# Patient Record
Sex: Female | Born: 1940 | Race: White | Hispanic: No | Marital: Married | Smoking: Former smoker
Health system: Southern US, Community
[De-identification: ages and names within clinical notes are randomized; demographics above are authoritative.]

## PROBLEM LIST (undated history)

## (undated) DIAGNOSIS — I82409 Acute embolism and thrombosis of unspecified deep veins of unspecified lower extremity: Secondary | ICD-10-CM

## (undated) DIAGNOSIS — Z7901 Long term (current) use of anticoagulants: Secondary | ICD-10-CM

## (undated) DIAGNOSIS — K219 Gastro-esophageal reflux disease without esophagitis: Secondary | ICD-10-CM

## (undated) DIAGNOSIS — E039 Hypothyroidism, unspecified: Secondary | ICD-10-CM

## (undated) DIAGNOSIS — I10 Essential (primary) hypertension: Secondary | ICD-10-CM

## (undated) DIAGNOSIS — K5792 Diverticulitis of intestine, part unspecified, without perforation or abscess without bleeding: Secondary | ICD-10-CM

## (undated) DIAGNOSIS — I2699 Other pulmonary embolism without acute cor pulmonale: Secondary | ICD-10-CM

## (undated) HISTORY — PX: APPENDECTOMY: SHX54

## (undated) HISTORY — DX: Acute embolism and thrombosis of unspecified deep veins of unspecified lower extremity: I82.409

## (undated) HISTORY — DX: Long term (current) use of anticoagulants: Z79.01

## (undated) HISTORY — DX: Diverticulitis of intestine, part unspecified, without perforation or abscess without bleeding: K57.92

## (undated) HISTORY — PX: SECONDARY INTRAOCULAR LENSE IMPLANTATION: SHX2390

## (undated) HISTORY — PX: PARTIAL HYSTERECTOMY: SHX80

## (undated) HISTORY — PX: SHOULDER SURGERY: SHX246

## (undated) HISTORY — DX: Gastro-esophageal reflux disease without esophagitis: K21.9

## (undated) HISTORY — DX: Essential (primary) hypertension: I10

## (undated) HISTORY — DX: Hypothyroidism, unspecified: E03.9

## (undated) HISTORY — DX: Other pulmonary embolism without acute cor pulmonale: I26.99

---

## 1998-03-14 HISTORY — PX: BACK SURGERY: SHX140

## 2000-05-02 ENCOUNTER — Ambulatory Visit (HOSPITAL_COMMUNITY): Admission: RE | Admit: 2000-05-02 | Discharge: 2000-05-02 | Payer: Self-pay | Admitting: Gastroenterology

## 2006-05-10 ENCOUNTER — Inpatient Hospital Stay (HOSPITAL_BASED_OUTPATIENT_CLINIC_OR_DEPARTMENT_OTHER): Admission: RE | Admit: 2006-05-10 | Discharge: 2006-05-10 | Payer: Self-pay | Admitting: Cardiovascular Disease

## 2007-11-01 ENCOUNTER — Inpatient Hospital Stay (HOSPITAL_COMMUNITY): Admission: EM | Admit: 2007-11-01 | Discharge: 2007-11-04 | Payer: Self-pay | Admitting: Emergency Medicine

## 2010-02-24 ENCOUNTER — Ambulatory Visit: Payer: Self-pay | Admitting: Cardiovascular Disease

## 2010-05-26 ENCOUNTER — Other Ambulatory Visit: Payer: Self-pay | Admitting: Cardiovascular Disease

## 2010-05-26 ENCOUNTER — Ambulatory Visit
Admission: RE | Admit: 2010-05-26 | Discharge: 2010-05-26 | Disposition: A | Discharge status: Home / Self Care | Payer: PRIVATE HEALTH INSURANCE | Source: Ambulatory Visit | Attending: Cardiovascular Disease | Admitting: Cardiovascular Disease

## 2010-05-26 ENCOUNTER — Ambulatory Visit (INDEPENDENT_AMBULATORY_CARE_PROVIDER_SITE_OTHER): Payer: PRIVATE HEALTH INSURANCE | Admitting: Cardiovascular Disease

## 2010-05-26 ENCOUNTER — Inpatient Hospital Stay (HOSPITAL_COMMUNITY)
Admission: AD | Admit: 2010-05-26 | Discharge: 2010-05-31 | DRG: 176 | Disposition: A | Discharge status: Home / Self Care | Payer: Medicare Other | Source: Ambulatory Visit | Attending: Cardiovascular Disease | Admitting: Cardiovascular Disease

## 2010-05-26 DIAGNOSIS — Z7901 Long term (current) use of anticoagulants: Secondary | ICD-10-CM

## 2010-05-26 DIAGNOSIS — I10 Essential (primary) hypertension: Secondary | ICD-10-CM

## 2010-05-26 DIAGNOSIS — I2699 Other pulmonary embolism without acute cor pulmonale: Principal | ICD-10-CM | POA: Diagnosis present

## 2010-05-26 DIAGNOSIS — E039 Hypothyroidism, unspecified: Secondary | ICD-10-CM | POA: Diagnosis present

## 2010-05-26 DIAGNOSIS — I82409 Acute embolism and thrombosis of unspecified deep veins of unspecified lower extremity: Secondary | ICD-10-CM | POA: Diagnosis present

## 2010-05-26 DIAGNOSIS — R0602 Shortness of breath: Secondary | ICD-10-CM

## 2010-05-26 DIAGNOSIS — E119 Type 2 diabetes mellitus without complications: Secondary | ICD-10-CM

## 2010-05-26 DIAGNOSIS — Z880 Allergy status to penicillin: Secondary | ICD-10-CM

## 2010-05-26 DIAGNOSIS — Z882 Allergy status to sulfonamides status: Secondary | ICD-10-CM

## 2010-05-26 DIAGNOSIS — I517 Cardiomegaly: Secondary | ICD-10-CM

## 2010-05-26 LAB — PROTIME-INR
INR: 1.02 (ref 0.00–1.49)
Prothrombin Time: 13.6 seconds (ref 11.6–15.2)

## 2010-05-26 LAB — CBC
MCV: 86.6 fL (ref 78.0–100.0)
Platelets: 272 10*3/uL (ref 150–400)
RBC: 4.25 MIL/uL (ref 3.87–5.11)
RDW: 12.8 % (ref 11.5–15.5)
WBC: 10.3 10*3/uL (ref 4.0–10.5)

## 2010-05-26 LAB — COMPREHENSIVE METABOLIC PANEL
Albumin: 3.4 g/dL — ABNORMAL LOW (ref 3.5–5.2)
Alkaline Phosphatase: 61 U/L (ref 39–117)
BUN: 10 mg/dL (ref 6–23)
Calcium: 8.3 mg/dL — ABNORMAL LOW (ref 8.4–10.5)
Creatinine, Ser: 1 mg/dL (ref 0.4–1.2)
Glucose, Bld: 146 mg/dL — ABNORMAL HIGH (ref 70–99)
Total Protein: 7.1 g/dL (ref 6.0–8.3)

## 2010-05-26 LAB — DIFFERENTIAL
Basophils Absolute: 0 10*3/uL (ref 0.0–0.1)
Basophils Relative: 0 % (ref 0–1)
Eosinophils Absolute: 0.2 10*3/uL (ref 0.0–0.7)
Eosinophils Relative: 2 % (ref 0–5)
Lymphs Abs: 3.8 10*3/uL (ref 0.7–4.0)
Neutrophils Relative %: 56 % (ref 43–77)

## 2010-05-26 LAB — GLUCOSE, CAPILLARY: Glucose-Capillary: 119 mg/dL — ABNORMAL HIGH (ref 70–99)

## 2010-05-26 LAB — APTT: aPTT: 92 seconds — ABNORMAL HIGH (ref 24–37)

## 2010-05-26 MED ORDER — IOHEXOL 300 MG/ML  SOLN
125.0000 mL | Freq: Once | INTRAMUSCULAR | Status: AC | PRN
  Administered 2010-05-26: 125 mL via INTRAVENOUS

## 2010-05-27 DIAGNOSIS — I2699 Other pulmonary embolism without acute cor pulmonale: Secondary | ICD-10-CM

## 2010-05-27 LAB — CBC
Hemoglobin: 13.3 g/dL (ref 12.0–15.0)
MCH: 29.2 pg (ref 26.0–34.0)
MCV: 87 fL (ref 78.0–100.0)
Platelets: 361 10*3/uL (ref 150–400)
RBC: 4.55 MIL/uL (ref 3.87–5.11)
WBC: 11.1 10*3/uL — ABNORMAL HIGH (ref 4.0–10.5)

## 2010-05-27 LAB — BASIC METABOLIC PANEL
CO2: 26 mEq/L (ref 19–32)
Chloride: 105 mEq/L (ref 96–112)
Creatinine, Ser: 0.97 mg/dL (ref 0.4–1.2)
GFR calc Af Amer: >60 mL/min (ref >60)
Sodium: 137 mEq/L (ref 135–145)

## 2010-05-27 LAB — BETA-2-GLYCOPROTEIN I ABS, IGG/M/A
Beta-2-Glycoprotein I IgA: 12 A Units (ref <20)
Beta-2-Glycoprotein I IgM: 16 M Units (ref <20)

## 2010-05-27 LAB — LUPUS ANTICOAGULANT PANEL
PTT Lupus Anticoagulant: 108.7 secs — ABNORMAL HIGH (ref 30.0–45.6)
PTTLA 4:1 Mix: 88.5 secs — ABNORMAL HIGH (ref 30.0–45.6)
PTTLA Confirmation: 15.3 secs — ABNORMAL HIGH (ref <8.0)

## 2010-05-27 LAB — CARDIOLIPIN ANTIBODIES, IGG, IGM, IGA
Anticardiolipin IgG: 9 GPL U/mL — ABNORMAL LOW (ref <23)
Anticardiolipin IgM: 2 MPL U/mL — ABNORMAL LOW (ref <11)

## 2010-05-27 LAB — FACTOR 5 LEIDEN

## 2010-05-27 LAB — HOMOCYSTEINE: Homocysteine: 10.8 umol/L (ref 4.0–15.4)

## 2010-05-27 LAB — HEPARIN LEVEL (UNFRACTIONATED): Heparin Unfractionated: <0.1 IU/mL — ABNORMAL LOW (ref 0.30–0.70)

## 2010-05-27 LAB — ANTITHROMBIN III: AntiThromb III Func: 101 % (ref 76–126)

## 2010-05-28 LAB — PROTHROMBIN GENE MUTATION

## 2010-05-28 LAB — HEPARIN LEVEL (UNFRACTIONATED): Heparin Unfractionated: 0.71 IU/mL — ABNORMAL HIGH (ref 0.30–0.70)

## 2010-05-29 LAB — CBC
MCH: 29.6 pg (ref 26.0–34.0)
MCHC: 33.7 g/dL (ref 30.0–36.0)
Platelets: 315 10*3/uL (ref 150–400)

## 2010-05-29 LAB — HEPARIN LEVEL (UNFRACTIONATED): Heparin Unfractionated: 0.65 IU/mL (ref 0.30–0.70)

## 2010-05-30 ENCOUNTER — Inpatient Hospital Stay (HOSPITAL_COMMUNITY): Payer: Medicare Other

## 2010-05-30 LAB — PROTIME-INR: Prothrombin Time: 14.3 seconds (ref 11.6–15.2)

## 2010-05-31 LAB — PROTIME-INR
INR: 1.36 (ref 0.00–1.49)
Prothrombin Time: 17 seconds — ABNORMAL HIGH (ref 11.6–15.2)

## 2010-06-01 LAB — PROTEIN C, TOTAL: Protein C, Total: 142 % — ABNORMAL HIGH (ref 70–140)

## 2010-06-01 LAB — FACTOR 5 LEIDEN

## 2010-06-02 ENCOUNTER — Ambulatory Visit (INDEPENDENT_AMBULATORY_CARE_PROVIDER_SITE_OTHER): Payer: Medicare Other | Admitting: *Deleted

## 2010-06-02 DIAGNOSIS — I2699 Other pulmonary embolism without acute cor pulmonale: Secondary | ICD-10-CM

## 2010-06-02 DIAGNOSIS — Z7901 Long term (current) use of anticoagulants: Secondary | ICD-10-CM

## 2010-06-02 DIAGNOSIS — I82409 Acute embolism and thrombosis of unspecified deep veins of unspecified lower extremity: Secondary | ICD-10-CM

## 2010-06-03 ENCOUNTER — Encounter: Payer: Self-pay | Admitting: Cardiovascular Disease

## 2010-06-08 ENCOUNTER — Encounter: Payer: Self-pay | Admitting: Nurse Practitioner

## 2010-06-08 DIAGNOSIS — I10 Essential (primary) hypertension: Secondary | ICD-10-CM

## 2010-06-08 DIAGNOSIS — Z7901 Long term (current) use of anticoagulants: Secondary | ICD-10-CM

## 2010-06-08 DIAGNOSIS — I2699 Other pulmonary embolism without acute cor pulmonale: Secondary | ICD-10-CM | POA: Insufficient documentation

## 2010-06-09 ENCOUNTER — Ambulatory Visit (INDEPENDENT_AMBULATORY_CARE_PROVIDER_SITE_OTHER): Payer: Medicare Other | Admitting: Nurse Practitioner

## 2010-06-09 ENCOUNTER — Encounter: Payer: Self-pay | Admitting: Nurse Practitioner

## 2010-06-09 VITALS — BP 140/84 | HR 78 | Wt 185.0 lb

## 2010-06-09 DIAGNOSIS — I272 Pulmonary hypertension, unspecified: Secondary | ICD-10-CM | POA: Insufficient documentation

## 2010-06-09 DIAGNOSIS — R252 Cramp and spasm: Secondary | ICD-10-CM

## 2010-06-09 DIAGNOSIS — Z7901 Long term (current) use of anticoagulants: Secondary | ICD-10-CM

## 2010-06-09 DIAGNOSIS — R0989 Other specified symptoms and signs involving the circulatory and respiratory systems: Secondary | ICD-10-CM

## 2010-06-09 DIAGNOSIS — IMO0001 Reserved for inherently not codable concepts without codable children: Secondary | ICD-10-CM

## 2010-06-09 DIAGNOSIS — R0609 Other forms of dyspnea: Secondary | ICD-10-CM

## 2010-06-09 DIAGNOSIS — R0600 Dyspnea, unspecified: Secondary | ICD-10-CM

## 2010-06-09 DIAGNOSIS — I2699 Other pulmonary embolism without acute cor pulmonale: Secondary | ICD-10-CM

## 2010-06-09 DIAGNOSIS — I10 Essential (primary) hypertension: Secondary | ICD-10-CM

## 2010-06-09 LAB — BASIC METABOLIC PANEL
BUN: 19 mg/dL (ref 6–23)
CO2: 26 mEq/L (ref 19–32)
Calcium: 8.7 mg/dL (ref 8.4–10.5)
Chloride: 103 mEq/L (ref 96–112)
Creat: 1.09 mg/dL (ref 0.40–1.20)
Glucose, Bld: 83 mg/dL (ref 70–99)
Potassium: 4.3 mEq/L (ref 3.5–5.3)
Sodium: 139 mEq/L (ref 135–145)

## 2010-06-09 LAB — MAGNESIUM: Magnesium: 2 mg/dL (ref 1.5–2.5)

## 2010-06-09 NOTE — Patient Instructions (Signed)
We are going to check your coumadin level today.  We are going to check some other labs today to include magnesium and potassium See Dr. Elease Hashimoto in about 2 months.

## 2010-06-09 NOTE — Assessment & Plan Note (Signed)
INR is 5.5 today. We will hold coumadin today and tomorrow and recheck on Friday. She is off her Lovenox and aspirin.

## 2010-06-09 NOTE — Progress Notes (Signed)
History of Present Illness: Virginia Summers is seen back today for a post hospital visit. She is seen for Dr. Elease Hashimoto. She had bilateral PE and DVT. She is now on coumadin. Her workup for the etiology was negative. Hospital records are reviewed. She is now off of her hormones. She is feeling better. She is very anxious about her condition. She has had no further chest pain. She has just a few twinges of discomfort in her legs. She has had no swelling. She is tolerating her medicines. She does have some bruising. Her echo did show grade 1 diastolic dysfunction with mild to moderate pulmonary HTN. She is trying to lose weight.   Current Outpatient Prescriptions on File Prior to Visit  Medication Sig Dispense Refill  . albuterol (PROVENTIL HFA) 108 (90 BASE) MCG/ACT inhaler Inhale 2 puffs into the lungs every 4 (four) hours as needed.        Marland Kitchen amLODipine (NORVASC) 5 MG tablet Take 5 mg by mouth daily.        . furosemide (LASIX) 40 MG tablet Take 40 mg by mouth daily.        Marland Kitchen levothyroxine (SYNTHROID, LEVOTHROID) 25 MCG tablet Take 25 mcg by mouth daily.        Marland Kitchen lisinopril (PRINIVIL,ZESTRIL) 40 MG tablet Take 40 mg by mouth daily.        Marland Kitchen loratadine (CLARITIN) 10 MG tablet Take 10 mg by mouth daily.        . Multiple Vitamin (MULTIVITAMIN) tablet Take 1 tablet by mouth daily.        . nebivolol (BYSTOLIC) 10 MG tablet Take 10 mg by mouth daily.        Marland Kitchen omeprazole (PRILOSEC) 20 MG capsule Take 20 mg by mouth daily.        . potassium chloride (KLOR-CON) 10 MEQ CR tablet Take 10 mEq by mouth daily.        . traMADol (ULTRAM) 50 MG tablet Take 50 mg by mouth every 4 (four) hours as needed. May take one to two tablets, up to 8/day       . warfarin (COUMADIN) 5 MG tablet Take 7.5 mg by mouth daily.        . fish oil-omega-3 fatty acids 1000 MG capsule Take 1 g by mouth daily.        Marland Kitchen DISCONTD: aspirin 81 MG tablet Take 81 mg by mouth daily.        Marland Kitchen DISCONTD: enoxaparin (LOVENOX) 150 MG/ML injection Inject into  the skin every 12 (twelve) hours.        Marland Kitchen DISCONTD: estradiol (ESTRACE) 2 MG tablet Take 2 mg by mouth daily. 1/2 TO 1 TABLET DAILY         Allergies  Allergen Reactions  . Penicillins Swelling and Rash  . Sulfa Drugs Cross Reactors Swelling and Rash    Past Medical History  Diagnosis Date  . DVT (deep venous thrombosis)     BILATERAL  . Pulmonary embolism   . HTN (hypertension)   . Diabetes mellitus   . Chest pain   . Hypothyroidism   . GERD (gastroesophageal reflux disease)   . Diverticulitis   . Anticoagulant long-term use     Past Surgical History  Procedure Date  . Back surgery 2000    DISC  . Partial hysterectomy AGE 88's APPROX.  Marland Kitchen Appendectomy AGE 24  . Shoulder surgery 5-6 YRS OLD    PINS  . Secondary intraocular lense implantation 5621,3086  OU    History  Smoking status  . Former Smoker -- 0.5 packs/day for 30 years  . Types: Cigarettes  . Quit date: 03/15/2003  Smokeless tobacco  . Never Used    History  Alcohol Use No    Family History  Problem Relation Age of Onset  . Transient ischemic attack Father   . Coronary artery disease Father   . Stroke Mother   . Diabetes Mother   . Diabetes Sister   . Hypertension Daughter     Review of Systems: The review of systems is as above.  All other systems were reviewed and are negative.  Physical Exam: BP 140/84  Pulse 78  Wt 185 lb (83.915 kg) She is a pleasant obese female, in no acute distress. Skin is warm and dry. Color is normal. She has some bruising on her arms. HEENT is negative. Lungs are clear. Cardiac exam shows a regular rate and rhythm. Abdomen is obese. Extremities are without edema. Gait and ROM are intact. She has no gross neurologic deficits.  ECG: N/A  Assessment / Plan:

## 2010-06-09 NOTE — Assessment & Plan Note (Signed)
BMET and Mg are checked today.

## 2010-06-09 NOTE — Assessment & Plan Note (Signed)
Mild LVH per echo. We will keep her on her current meds. I encouraged weight loss.

## 2010-06-09 NOTE — Assessment & Plan Note (Addendum)
She remains on coumadin anticoagulation. Her Factor V was negative. Her hypercoagulable workup was notable for presence of lupus anticoagulant. Workup was negative for prothrombin gene mutation, presence of cariolipin antibodies, beta 2 glycoprotein antibodies. Total protein was WNL. Follow up labs are checked today.   Will discuss with Dr. Elease Hashimoto regarding further workup. I think she is committed to long term coumadin.

## 2010-06-09 NOTE — Assessment & Plan Note (Signed)
Mild to moderate per echo. Encouraged weight loss. Will need follow up echo in the future.

## 2010-06-10 ENCOUNTER — Telehealth: Payer: Self-pay | Admitting: Nurse Practitioner

## 2010-06-10 NOTE — Telephone Encounter (Signed)
Spoke with Virginia Summers. Her labs are reviewed with her. I relayed to her that I had spoken with Dr. Elease Hashimoto. She had a positive lupus anticoagulant which is the reason for her DVT/PE. She is committed to life time anticoagulation. He has spoken with Dr. Cyndie Chime regarding her condition and we may send her to see him in the future. For now, she is to stay with her current regimen that includes coumadin.

## 2010-06-11 ENCOUNTER — Encounter (INDEPENDENT_AMBULATORY_CARE_PROVIDER_SITE_OTHER): Payer: Medicare Other | Admitting: *Deleted

## 2010-06-11 ENCOUNTER — Ambulatory Visit (INDEPENDENT_AMBULATORY_CARE_PROVIDER_SITE_OTHER): Payer: Self-pay | Admitting: *Deleted

## 2010-06-11 DIAGNOSIS — R0989 Other specified symptoms and signs involving the circulatory and respiratory systems: Secondary | ICD-10-CM

## 2010-06-11 LAB — POCT INR: INR: 3.3

## 2010-06-15 ENCOUNTER — Ambulatory Visit (INDEPENDENT_AMBULATORY_CARE_PROVIDER_SITE_OTHER): Payer: Medicare Other | Admitting: *Deleted

## 2010-06-15 DIAGNOSIS — Z7901 Long term (current) use of anticoagulants: Secondary | ICD-10-CM

## 2010-06-15 DIAGNOSIS — I82409 Acute embolism and thrombosis of unspecified deep veins of unspecified lower extremity: Secondary | ICD-10-CM

## 2010-06-25 ENCOUNTER — Ambulatory Visit (INDEPENDENT_AMBULATORY_CARE_PROVIDER_SITE_OTHER): Payer: Medicare Other | Admitting: *Deleted

## 2010-06-25 DIAGNOSIS — I82409 Acute embolism and thrombosis of unspecified deep veins of unspecified lower extremity: Secondary | ICD-10-CM

## 2010-06-25 LAB — POCT INR: INR: 3.6

## 2010-06-28 NOTE — Discharge Summary (Signed)
NAMEMELL, MELLOTT NO.:  [id number]  MEDICAL RECORD NO.:  [id number]           PATIENT TYPE:  I  LOCATION:  2025                         FACILITY:  [facility]  PHYSICIAN:  Vesta Mixer, M.D. DATE OF BIRTH:  1940/03/17  DATE OF ADMISSION:  05/26/2010 DATE OF DISCHARGE:  05/31/2010                              DISCHARGE SUMMARY   PRIMARY CARDIOLOGIST:  Vesta Mixer, MD  PRIMARY CARE PROVIDER:  Windle Guard, MD  DISCHARGE DIAGNOSIS:  Bilateral acute pulmonary thromboembolism.  SECONDARY DIAGNOSES: 1. Bilateral deep vein thromboses. 2. Hypertension. 3. Diabetes mellitus. 4. History of chest pain with normal catheterization in 2008. 5. Hypothyroidism.  ALLERGIES:  SULFA and PENICILLIN.  PROCEDURES:  A 2-D echocardiogram on May 26, 2010 showing an EF of 60- 65% with normal wall motion.  Grade 1 diastolic dysfunction.  Mild-to- moderate pulmonary hypertension with a PASP of 40 mmHg.  HISTORY OF PRESENT ILLNESS:  A 70 year old female with the above problem list.  Approximately 8 days prior to admission, the patient began to experience pleuritic chest pain and exertional dyspnea.  She was seen in the office on May 26, 2010 and referred to [facility] for lower extremity ultrasound and CT angio of the chest.  Lower extremity ultrasound did show bilateral deep venous thromboses while CT angio of the chest showed bilateral pulmonary emboli.  She was admitted to [facility] for further evaluation and anticoagulation.  HOSPITAL COURSE:  Following admission, the patient was placed on heparin and Coumadin therapy.  A 2-D echocardiogram was undertaken on May 26, 2010, showing normal LV function and mildly-to-moderately elevated pulmonary pressures.  There was no evidence of right ventricular failure.  The patient's heparin was subsequently switched to Lovenox therapy at 80 mg b.i.d.  Her INR has been slow to rise and today is only 1.36.   Hypercoagulable workup was notable for presence of lupus anticoagulant.  Factor V Leiden evaluation is still pending.  Workup was negative for prothrombin gene mutation, presence of cardiolipin antibodies, presents of beta 2-glycoprotein antibodies.  Total protein CNS were within normal limits with slight elevation of her functional protein C.  During this admission, the patient had a mild productive cough.  She remained afebrile with normal white count.  She has shown improvement with albuterol inhaler.  She is felt satisfactory for discharge today.  DISCHARGE LABS:  Hemoglobin 12.8, hematocrit 38.0, WBC 9.1, platelets 316.  INR 1.36.  PT 17, total protein C 129, functional protein C 169, total protein S 125, functional protein S 124.  Lupus anticoagulant was detected.  PTT-LA 108.7, PTT-LA 4:1 mix 88.5, DRVVT 51.4, DRVVT 1:1 mix 43.1.  Sodium 137, potassium 3.5, chloride 105, CO2 26, BUN 11, creatinine 0.97, glucose 113, total bilirubin 0.5, alkaline phosphatase 61, AST 19, ALT 11.  Total protein 7.1, albumin 3.4, calcium 8.2.  TSH 3.206.  Homocysteine 10.8.  Beta 2-glycoprotein antibodies were within normal limits.  Anticardiolipin antibodies were within normal limits. Prothrombin gene mutation was negative.  DISPOSITION:  The patient will be discharged home today in good condition.  FOLLOWUP PLANS AND APPOINTMENTS:  The  patient will follow up with [facility] on June 02, 2010 at 1 p.m.  She will follow up with Norma Fredrickson, nurse practitioner on June 09, 2010 at 1:30 p.m.  DISCHARGE MEDICATIONS: 1. Albuterol 90 mcg inhaler two puffs q. 4 h. p.r.n. 2. Coumadin 5 mg one and a half tablets nightly until directed     otherwise by Coumadin Clinic. 3. Enoxaparin 80 mg subcu b.i.d. until directed otherwise by Coumadin     Clinic. 4. Potassium chloride 10 mEq daily,. 5. Amlodipine 5 mg daily. 6. Bystolic 5 mg daily. 7. Furosemide 40 mg daily. 8.  Levothyroxine 25 mcg daily. 9. Lisinopril 40 mg daily. 10.Loratadine 10 mg daily. 11.Omeprazole 20 mg daily. 12.Tramadol 50 mg one to two tablets q.4 h. p.r.n.  OUTSTANDING LABORATORY STUDIES:  Follow up INR on Wednesday, June 02, 2010.  Factor V Leiden results are still pending.  DURATION DISCHARGE ENCOUNTER:  Sixty minutes including physician time.     Nicolasa Ducking, ANP   ______________________________ Vesta Mixer, M.D.    CB/MEDQ  D:  05/31/2010  T:  06/01/2010  Job:  [id number]  cc:   Windle Guard, M.D.  Electronically Signed by Nicolasa Ducking ANP on 06/22/2010 04:03:24 PM Electronically Signed by Kristeen Miss M.D. on 06/28/2010 03:11:13 PM

## 2010-07-02 ENCOUNTER — Ambulatory Visit (INDEPENDENT_AMBULATORY_CARE_PROVIDER_SITE_OTHER): Payer: Medicare Other | Admitting: *Deleted

## 2010-07-02 DIAGNOSIS — I82409 Acute embolism and thrombosis of unspecified deep veins of unspecified lower extremity: Secondary | ICD-10-CM

## 2010-07-02 LAB — POCT INR: INR: 2.5

## 2010-07-06 ENCOUNTER — Encounter: Payer: Medicare Other | Admitting: *Deleted

## 2010-07-16 ENCOUNTER — Ambulatory Visit (INDEPENDENT_AMBULATORY_CARE_PROVIDER_SITE_OTHER): Payer: Medicare Other | Admitting: *Deleted

## 2010-07-16 DIAGNOSIS — I82409 Acute embolism and thrombosis of unspecified deep veins of unspecified lower extremity: Secondary | ICD-10-CM

## 2010-07-27 NOTE — Discharge Summary (Signed)
Virginia Summers, Virginia Summers NO.:  [id number]   MEDICAL RECORD NO.:  [id number]          PATIENT TYPE:  INP   LOCATION:  6733                         FACILITY:  [facility]   PHYSICIAN:  Beckey Rutter, MD  DATE OF BIRTH:  [birth year]   DATE OF ADMISSION:  11/01/2007  DATE OF DISCHARGE:  11/04/2007                               DISCHARGE SUMMARY   PRIMARY CARE PHYSICIAN:  Windle Guard, MD   HISTORY OF PRESENT ILLNESS AND PRESENTING COMPLAINT:  A 70 year old  presented with complaint of nausea and vomiting and lower back pain.  The patient was found to have evidence of urinary tract infection and  pyelonephritis.   HOSPITAL COURSE:  1. The patient was started on Levaquin for pyelonephritis.  The      patient has very good response with improvement of the pains,      improvement of the fever, and improvement of the white count.  The      white count went from 15.6 on presentation to 6.7.  The blood      culture grew Escherichia coli which is sensitive to most      antibiotic.  Plan now is to continue the patient on Levaquin orally      to complete 14 days for complicated urinary tract infection since      this UTI is recurrent and now is complicated with pyelonephritis      picture.  I also gave the patient referral to [facility]      because of the same reason, complicated urinary tract infection and      frequent recurrence.  2. Hypothyroidism.  TSH was checked.  The patient remained stable      during hospital stay.  3. Diabetes.  A1c was checked as below.  The patient remained on      sliding scale and remained stable during hospital stay.  4. Obesity.  The patient counseled to modified diet.  5. Hypertension.  The patient is on Norvasc and she will be discharged      on Norvasc and Lasix as well as lisinopril.   DISCHARGE MEDICATIONS:  1. Levaquin 500 mg p.o. once a day for 10 more days.  2. Norvasc 5 mg p.o. daily.  3. Lasix 40 mg daily.  4. Lisinopril 40  mg daily.  5. Synthroid 50 mcg daily.   DISCHARGE DIAGNOSES:  1. Pyelonephritis.  2. Diabetes.  3. Obesity.  4. Hypothyroidism.  5. Hypertension.   HOSPITAL PROCEDURES:  1. Abdominal x-ray on November 01, 2007 showing no evidence of bowel      obstruction or free intraperitoneal air.  2. Urine culture collected on November 01, 2007, the growth was showing      Escherichia coli more than 100,000 colonies.  The the E. coli is      sensitive to cefazolin, ceftriaxone, ciprofloxacin, gentamicin,      levofloxacin, nitrofurantoin, Tobramycin, and Bactrim.  The E. coli      is resistant to ampicillin.  The A1c on November 02, 2007 was showing      6.4.  Free T4 is 1.14.  TSH is 0.74.   DISCHARGE PLAN:  The patient is stable for discharge today to follow up  with Dr. Jeannetta Nap as discussed with her.  I gave the patient referral to  [facility] for recurrent and complicated urinary tract infection  and prescription was given for Levaquin to finish a course of 14 days.      Beckey Rutter, MD  Electronically Signed     EME/MEDQ  D:  11/04/2007  T:  11/05/2007  Job:  [id number]   cc:   Windle Guard, M.D.

## 2010-07-27 NOTE — H&P (Signed)
NAMESOLEDAD, BUDREAU NO.:  [id number]   MEDICAL RECORD NO.:  [id number]          PATIENT TYPE:  INP   LOCATION:  6733                         FACILITY:  [facility]   PHYSICIAN:  Renee Ramus, MD       DATE OF BIRTH:  07-17-1940   DATE OF ADMISSION:  11/01/2007  DATE OF DISCHARGE:                              HISTORY & PHYSICAL   HISTORY:  The patient is a 70 year old female with complaints of nausea,  vomiting, and pain in the lower back x1 day prior to admission.  The  patient has a history of recurrent pyelonephritis, but currently denies  frequency or urgency.  The patient also denies fevers, chills, night  sweats, chest pain, shortness of breath, PND, or orthopnea.  The patient  also denies dysuria.  The patient is seen in the Emergency Department  diagnosed with pyelonephritis and has been admitted for further  evaluation and treatment.   PAST MEDICAL HISTORY:  1. Hypertension.  2. Recurrent pyelonephritis.  3. Diverticulitis.  4. Hypothyroid.  5. Obesity.  6. Type 2 diabetes mellitus, diet controlled.   MEDICATIONS:  1. Norvasc 5 mg p.o. daily.  2. Lasix 20 mg p.o. q.12 h. p.r.n. fluid.  3. Lisinopril 40 mg p.o. daily.  4. Tramadol 50 mg 1-2 p.o. q.4. h. p.r.n. anxiety.  5. Synthroid 50 mcg p.o. daily.   ALLERGIES:  SULFA and PENICILLIN.   SOCIAL HISTORY:  The patient use no alcohol or tobacco use.  She lives  at home with her husband.   FAMILY HISTORY:  Not available.   REVIEW OF SYSTEMS:  All other comprehensive review of systems are  negative.   PHYSICAL EXAMINATION:  GENERAL:  This is a well developed, well  nourished, somewhat obese white female, currently in no apparent  distress.  VITAL SIGNS:  Blood pressure 133/67, pulse of 90, respiratory rate 22,  temperature 102.1.  HEENT:  Oropharynx is clear.  Mucous membranes pink and moist.  TMs  clear bilaterally.  Pupils equal, round, reactive to light and  accommodation.  Extraocular muscles  are intact.  NECK:  No jugular venous distention, lymphadenopathy.  CARDIOVASCULAR:  Regular rate and rhythm without murmurs, rubs, or  gallops.  PULMONARY:  Lungs are clear to auscultation bilaterally.  ABDOMEN:  Soft, somewhat obese, nontender, and nondistended without  hepatosplenomegaly.  Bowel sounds are present.  No rebound or guarding.  EXTREMITIES:  She has no clubbing, cyanosis, or edema.  She has good  peripheral pulses in dorsalis pedis and radial arteries.  She is able to  move all extremities.  NEUROLOGIC:  Cranial nerves II-XII are grossly intact.  She has no focal  or neurological deficits.   LABORATORY DATA:  White count 15.6, H&H 13 and 39, MCV 88, and platelets  257.  Sodium 138, potassium 3.9, chloride 103, bicarb 26, BUN 16,  creatinine 0.9, glucose 161.  UA shows specific gravity 1.023, positive  for nitrites, 21-50 wbc's and many bacteria.   ASSESSMENT AND PLAN:  1. Pyelonephritis.  We will treat with IV Levaquin and transition to  p.o. in the a.m.  The patient will require a 7-day course of      antibiotics, but anticipate good clinical response in this      treatment.  2. Hypothyroid.  We will check TSH and free T4 and continue Synthroid.  3. Diabetes mellitus.  Check hemoglobin A1c.  Consider sliding scale      insulin, but initially, I am going to place her on a regular diet      and see where her sugars lie.  We will recheck blood sugars a.c.      and q.h.s. and then assess whether or not I want to give insulin.  4. Obesity.  Currently, stable.  5. Hypertension.  Continue Norvasc, hold Lasix, and continue      lisinopril.  6. Disposition.  Hopeful for discharge in 1-2 days.  The patient will      be admitted initially in draw-up status.   H&P was constructed by reviewing past medical history, conferring with  emergency medical room physician, and reviewing the emergency medical  record.   TIME SPENT:  One hour.      Renee Ramus, MD   Electronically Signed     JF/MEDQ  D:  11/01/2007  T:  11/02/2007  Job:  [id number]   cc:   Windle Guard, M.D.

## 2010-07-30 ENCOUNTER — Ambulatory Visit (INDEPENDENT_AMBULATORY_CARE_PROVIDER_SITE_OTHER): Payer: Medicare Other | Admitting: *Deleted

## 2010-07-30 DIAGNOSIS — I82409 Acute embolism and thrombosis of unspecified deep veins of unspecified lower extremity: Secondary | ICD-10-CM

## 2010-07-30 NOTE — Procedures (Signed)
[facility]  Patient:    Virginia Summers, Virginia Summers                         MRN: [id number] Proc. Date: 05/02/00 Adm. Date:  [id number] Attending:  Nelda Marseille CC:         Dr. Myra Rude, [facility]   Procedure Report  PROCEDURE:  Colonoscopy.  INDICATIONS:  Patient with abdominal pain, probably resolved diverticulitis, and bright red blood per rectum.  Due for colonic screening.  Consent was signed after risks, benefits, methods, and options thoroughly discussed in the office.  MEDICINES USED:  Demerol 120 mg, Versed 12 mg.  DESCRIPTION OF PROCEDURE:  Rectal inspection was pertinent for external hemorrhoids.  Digital exam was negative.  First the video colonoscope was inserted and, unfortunately, due to a divericula-filled, tortuous sigmoid, could not advance out of the sigmoid, and we elected to withdraw.  Other than diverticula, no abnormalities were seen.  We then went ahead and inserted the pediatric video colonoscope, again with some difficulty.  We were able to get through the sigmoid, and this required rolling her on her back.  We did use various abdominal pressures to advance to the mid-transverse.  At that point, scope began to loop again, and we were able to roll her on her right side and advance to the level of the ileocecal valve.  We could see half the cecum but because her cecum seemed to be upwardly flipped, we could not advance the scope deep into the cecal pole.  We did roll her on her back but still could not see the area just behind the valve, but no obvious mass lesion was seen growing from the cecum.  We did see the appendiceal orifice in the distance. The scope was then slowly withdrawn.  The prep was adequate.  There was some liquid stool that required washing and suctioning.  There was an occasional right and an occasional transverse and severe left-sided diverticula, but no polypoid lesions, masses, or  other abnormalities were seen as we slowly withdrew back to the rectum.  No obvious scope trauma was seen.  Once back in the rectum, the scope was then retroflexed, pertinent for some small internal hemorrhoids.  The scope was straightened, air was withdrawn, and the scope removed.  The patient tolerated the procedure fairly adequately.  There was no obvious immediate complication.  ENDOSCOPIC DIAGNOSES: 1. Internal-external hemorrhoids. 2. Significant left greater than right diverticula. 3. Otherwise within normal limits to half the cecum, unable to advance the    regular scope past the sigmoid but able to advance the pediatric scope to    this level.  PLAN:  Diverticula brochure.  Stress high-fiber diet, plenty of fluids, no nuts, seeds, popcorn.  Follow up in two months or p.r.n. to recheck guaiacs and symptoms and make sure no further workup plans are needed. DD:  05/02/00 TD:  05/03/00 Job: [id number]

## 2010-07-30 NOTE — Cardiovascular Report (Signed)
NAMETEILA, SKALSKY NO.:  [id number]   MEDICAL RECORD NO.:  [id number]          PATIENT TYPE:  OIB   LOCATION:  1963                         FACILITY:  [facility]   PHYSICIAN:  Vesta Mixer, M.D. DATE OF BIRTH:  April 09, 1940   DATE OF PROCEDURE:  05/10/2006  DATE OF DISCHARGE:                            CARDIAC CATHETERIZATION   HISTORY:  Virginia Summers is a 70 year old female with a history of chest  pains.  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  She is referred for heart catheterization for further  evaluation.  Meanwhile, she was also found to be hypothyroid and was  started on Synthroid.   The right femoral artery was easily cannulated using modified Seldinger  technique.   HEMODYNAMIC RESULTS:  LV pressure is 156/4 with an aortic pressure of  157/103.   ANGIOGRAPHY:  Left main:  The left main is smooth and normal.   The left anterior descending artery is fairly smooth and normal  throughout its course.  There is a large branching first diagonal artery  that is normal.   The left circumflex artery is fairly smooth and normal.  It gives off  two moderate to large obtuse marginal arteries which are also normal.  The circumflex artery terminates as a very small posterolateral branch.   The right coronary artery is moderate in size and is dominant.  It is  smooth and normal throughout its course.  The PDA and posterolateral  segment artery and normal.   Left ventriculogram was performed in the 30 degree RAO position.  It  reveals normal left ventricular systolic function.  Ejection fraction is  around 60-65%.   COMPLICATIONS:  None.   CONCLUSION:  1. Smooth and normal coronary arteries.  2. Normal left ventricular systolic function.   We will continue with medical therapy and look for other reasons for her  chest pain.           ______________________________  Vesta Mixer, M.D.     PJN/MEDQ  D:   05/10/2006  T:  05/10/2006  Job:  [id number]   cc:   Windle Guard, M.D.

## 2010-07-30 NOTE — Procedures (Signed)
[facility]  Patient:    Virginia Summers, Virginia Summers                         MRN: [id number] Proc. Date: 05/02/00 Adm. Date:  [id number] Attending:  Nelda Marseille CC:         Myra Rude, M.D., [facility]   Procedure Report  PROCEDURES PERFORMED:  Colonoscopy.  INDICATIONS:  A patient with abdominal pain, probably resolved diverticulitis and bright red blood per rectum due to colonic screening. Consent was signed after risks and benefits, methods and options were thoroughly discussed in the office.  MEDICINES USED:  Demerol 120 mg, Versed 12 mg.  DESCRIPTION OF PROCEDURE:  Rectal inspection is pertinent for external hemorrhoids, small. Digital exam was negative. First the video colonoscope was inserted and unfortunately due to a diverticula-filled tortuous sigmoid, c could not advance out of the sigmoid and we elected to withdraw. Other than diverticula, no other abnormalities were seen. We then went ahead and inserted the pediatric video colonoscope, again with some difficulty. We were able to get through the sigmoid and this required rolling her on her back. We did use various abdominal pressures to advance to the midtransverse. At that point the scope began to loop again and we were able to roll her on her right side and advance to the level of the ileocecal valve. We could see half the cecum, but because her cecum seemed to be upwardly-flipped, we could not advance the scope deep into the cecal pull. We did roll her on her back but still could not see the area just behind the valve, but no obvious mass lesion was seen growing from the cecum. We did see the appendiceal orifice in the distance. The scope was then slowly withdrawn. The prep was adequate; there was some liquid stool that required washing and suctioning. There was an occasional right and an occasional transverse and severe left sided diverticula, but no polypoid  lesions, masses or other abnormalities were seen as we slowly withdrew back to the rectum. No obvious scope trauma was seen. Once back in the rectum, the scope was retroflexed, pertinent for some small internal hemorrhoids. The scope was straightened, the air was withdrawn and the scope removed. The patient tolerated the procedure fairly adequately. There was no obvious immediate complication.  ENDOSCOPIC DIAGNOSES: 1. Internal and external hemorrhoids. 2. Significant left greater than right diverticula. 3. Otherwise within normal limits to half the cecum -- unable to    advance to regular scope past the sigmoid, but able to advance    the pediatric scope to this level.  PLAN: 1. Diverticula brochure. 2. Stress high-fiber diet, plenty of fluids, no nuts, seeds,    popcorn. 3. Follow-up in two months or p.r.n. to recheck guaiac symptoms    to make sure no further workup plans are needed. DD:  05/02/00 TD:  05/03/00 Job: [id number]

## 2010-07-30 NOTE — H&P (Signed)
NAMEILEY, DEIGNAN NO.:  [id number]   MEDICAL RECORD NO.:  [id number]           PATIENT TYPE:   LOCATION:                                 FACILITY:   PHYSICIAN:  Vesta Mixer, M.D. DATE OF BIRTH:  1941/02/15   DATE OF ADMISSION:  05/10/2006  DATE OF DISCHARGE:                              HISTORY & PHYSICAL   HISTORY:  Virginia Summers is a middle-aged female with a history of  hypertension and diabetes mellitus and obesity.  She is admitted for  heart catheterization after having an abnormal stress test.   Virginia Summers was recently referred to our office for episodes of shortness  of breath and chest discomfort.  She presents with episodes of dyspnea  for the past several weeks.  She denies any syncope or presyncope.  She  denies any cough or sputum production.  These episodes last for several  minutes.  She really cannot walk nearly as far she used to.   She was found to be hypothyroid, was just started on Synthroid the other  day.   She had a stress Cardiolite study which revealed reversible defect in  the anteroapical region.  We could not completely exclude breast  artifact but it did appear to be a reversible defect.  Her left  ventricular systolic function was at the lower limits of normal.  Because of these abnormalities she is referred for heart  catheterization.   She also had an echocardiogram which revealed left ventricular  hypertrophy with impaired left ventricular relaxation.  She had a trace  MR and TR.  She also had mild pulmonary hypertension.   CURRENT MEDICATIONS:  1. Metformin 500 mg with each meal.  2. Furosemide 20 mg to 40 mg every 12 hours.  3. Omeprazole 20 mg a day.  4. Metoprolol 200 mg twice a day.  5. Lisinopril 40 mg a day.  6. Multivitamin once a day.  7. Aspirin 81 mg a day.  8. Amlodipine 5 mg a day.   ALLERGIES:  1. SHE IS ALLERGIC TO SULFA DRUGS.  2. PENICILLIN.   PAST MEDICAL HISTORY:  1. Hypothyroidism.  2.  Obesity.  3. Hypertension.  4. Diabetes mellitus.  5. History of diverticulitis.   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  She used to smoke but quit in 2005.  She does not drink  alcohol.   FAMILY HISTORY:  Father died at age 69 due to complications related to  coronary artery disease.  Her mother died in her 33s due to a CVA.   REVIEW OF SYSTEMS:  Was reviewed and is essentially negative except as  noted in the HPI.   PHYSICAL EXAMINATION:  GENERAL:  On exam she is a middle-aged female in  no acute distress.  She is alert and oriented x3.  Mood and affect are  normal.  VITAL SIGNS:  Weight is 206, which is down 2 pounds from last visit.  Her blood pressure 164/84 with heart rate of 66.  HEENT/NECK: Exam reveals 2+ carotids.  She has no bruits.  No JVD, no  thyromegaly.  LUNGS:  Clear to auscultation.  HEART:  Regular rate, S1-S2.  ABDOMEN:  The abdominal exam reveals good bowel sounds and is nontender.  EXTREMITIES:  She has no clubbing, cyanosis or edema.  Pulses are 1+.   EKG:  Her EKG reveals normal sinus rhythm.  She has left bundle branch  block.   IMPRESSION AND PLAN:  1. Mickala presents with an abnormal stress Cardiolite study.  She has      evidence of an anteroapical defect which could be due to ischemia      or perhaps a breast artifact.  She does have a left bundle branch      block and progressive dyspnea.  We will schedule her for heart      catheterization.  We have discussed the risks, benefits, options of      heart catheterization.  She had understands and agrees to proceed.  2. She was also recently found to be hypothyroid.  We have started on      some low-dose Synthroid.  Hopefully this will help with some of her      symptoms.           ______________________________  Vesta Mixer, M.D.     PJN/MEDQ  D:  05/04/2006  T:  05/04/2006  Job:  [id number]   cc:   Windle Guard, M.D.

## 2010-08-10 ENCOUNTER — Ambulatory Visit (INDEPENDENT_AMBULATORY_CARE_PROVIDER_SITE_OTHER): Payer: Medicare Other | Admitting: *Deleted

## 2010-08-10 ENCOUNTER — Ambulatory Visit (INDEPENDENT_AMBULATORY_CARE_PROVIDER_SITE_OTHER): Payer: Medicare Other | Admitting: Cardiovascular Disease

## 2010-08-10 ENCOUNTER — Encounter: Payer: Self-pay | Admitting: Cardiovascular Disease

## 2010-08-10 VITALS — BP 162/90 | HR 92 | Ht 60.0 in | Wt 185.0 lb

## 2010-08-10 DIAGNOSIS — E039 Hypothyroidism, unspecified: Secondary | ICD-10-CM

## 2010-08-10 DIAGNOSIS — I10 Essential (primary) hypertension: Secondary | ICD-10-CM

## 2010-08-10 DIAGNOSIS — I2699 Other pulmonary embolism without acute cor pulmonale: Secondary | ICD-10-CM

## 2010-08-10 DIAGNOSIS — I82409 Acute embolism and thrombosis of unspecified deep veins of unspecified lower extremity: Secondary | ICD-10-CM

## 2010-08-10 LAB — BASIC METABOLIC PANEL
BUN: 15 mg/dL (ref 6–23)
Chloride: 104 mEq/L (ref 96–112)
Glucose, Bld: 120 mg/dL — ABNORMAL HIGH (ref 70–99)
Potassium: 4.1 mEq/L (ref 3.5–5.1)

## 2010-08-10 LAB — HEPATIC FUNCTION PANEL
Alkaline Phosphatase: 48 U/L (ref 39–117)
Bilirubin, Direct: 0.1 mg/dL (ref 0.0–0.3)
Total Bilirubin: 0.4 mg/dL (ref 0.3–1.2)

## 2010-08-10 LAB — LIPID PANEL
Total CHOL/HDL Ratio: 6
VLDL: 82.4 mg/dL — ABNORMAL HIGH (ref 0.0–40.0)

## 2010-08-10 LAB — POCT INR: INR: 3.1

## 2010-08-10 LAB — TSH: TSH: 0.67 u[IU]/mL (ref 0.35–5.50)

## 2010-08-10 MED ORDER — CARVEDILOL 25 MG PO TABS: 25.0000 mg | ORAL_TABLET | Freq: Two times a day (BID) | ORAL | Status: DC

## 2010-08-10 NOTE — Progress Notes (Signed)
Addended by: Vesta Mixer on: 08/10/2010 12:08 PM   Modules accepted: Orders

## 2010-08-10 NOTE — Assessment & Plan Note (Signed)
Her anticoagulation has been therapeutic. She's feeling quite a bit better and his breathing quite a bit better. We'll continue with Coumadin.

## 2010-08-10 NOTE — Assessment & Plan Note (Addendum)
Her blood pressure is a little bit elevated today. Her other readings have been well. She admits to not taking her Bystolic on a regular basis because of the cost. We'll change her to carvedilol 25 mg twice a day.   We'll have her continue to watch her salt intake and to exercise on a regular basis.

## 2010-08-10 NOTE — Progress Notes (Addendum)
Virginia Summers Date of Birth  03-26-1940 [facility] [address]     [address] [place], [place]  [address] [phone]  Fax  [phone]  History of Present Illness:  Pt is doing well.  Is feeling the best she has in months. Working in the garden regularly. Has not been taking Bystolic daily due to cost.  Also wants to have her TSH checked.  Current Outpatient Prescriptions on File Prior to Visit  Medication Sig Dispense Refill  . albuterol (PROVENTIL HFA) 108 (90 BASE) MCG/ACT inhaler Inhale 2 puffs into the lungs every 4 (four) hours as needed.        Marland Kitchen amLODipine (NORVASC) 5 MG tablet Take 5 mg by mouth daily.        . fish oil-omega-3 fatty acids 1000 MG capsule Take 1 g by mouth daily.        . furosemide (LASIX) 40 MG tablet Take 40 mg by mouth daily.        Marland Kitchen levothyroxine (SYNTHROID, LEVOTHROID) 25 MCG tablet Take 25 mcg by mouth daily.        Marland Kitchen lisinopril (PRINIVIL,ZESTRIL) 40 MG tablet Take 40 mg by mouth daily.        Marland Kitchen loratadine (CLARITIN) 10 MG tablet Take 10 mg by mouth daily.        . Multiple Vitamin (MULTIVITAMIN) tablet Take 1 tablet by mouth daily.        Marland Kitchen omeprazole (PRILOSEC) 20 MG capsule Take 20 mg by mouth daily.        . potassium chloride (KLOR-CON) 10 MEQ CR tablet Take 10 mEq by mouth daily.        . traMADol (ULTRAM) 50 MG tablet Take 50 mg by mouth every 4 (four) hours as needed. May take one to two tablets, up to 8/day       . warfarin (COUMADIN) 5 MG tablet Take 7.5 mg by mouth daily.        . nebivolol (BYSTOLIC) 10 MG tablet Take 10 mg by mouth daily.          Allergies  Allergen Reactions  . Penicillins Swelling and Rash  . Sulfa Drugs Cross Reactors Swelling and Rash    Past Medical History  Diagnosis Date  . DVT (deep venous thrombosis)     BILATERAL  . Pulmonary embolism   . HTN (hypertension)   . Diabetes mellitus   . Chest pain   . Hypothyroidism   . GERD (gastroesophageal reflux  disease)   . Diverticulitis   . Anticoagulant long-term use     Past Surgical History  Procedure Date  . Back surgery 2000    DISC  . Partial hysterectomy AGE 33's APPROX.  Marland Kitchen Appendectomy AGE 76  . Shoulder surgery 5-6 YRS OLD    PINS  . Secondary intraocular lense implantation 1308,6578    OU    History  Smoking status  . Former Smoker -- 0.5 packs/day for 30 years  . Types: Cigarettes  . Quit date: 03/15/2003  Smokeless tobacco  . Never Used    History  Alcohol Use No    Family History  Problem Relation Age of Onset  . Transient ischemic attack Father   . Coronary artery disease Father   . Stroke Mother   . Diabetes Mother   . Diabetes Sister   . Hypertension Daughter     Reviw of Systems:  Reviewed in the HPI.  All other systems are  negative.  Physical Exam: BP 162/90  Pulse 92  Ht 5' (1.524 m)  Wt 185 lb (83.915 kg)  BMI 36.13 kg/m2 The patient is alert and oriented x 3.  The mood and affect are normal.  The skin is warm and dry.  Color is normal.  The HEENT exam reveals that the sclera are nonicteric.  The mucous membranes are moist.  The carotids are 2+ without bruits.  There is no thyromegaly.  There is no JVD.  The lungs are clear.  The chest wall is non tender.  The heart exam reveals a regular rate with a normal S1 and S2.  There are no murmurs, gallops, or rubs.  The PMI is not displaced.   Abdominal exam reveals good bowel sounds.  There is no guarding or rebound.  There is no hepatosplenomegaly or tenderness.  There are no masses.  Exam of the legs reveal no clubbing, cyanosis, or edema.  The legs are without rashes.  The distal pulses are intact.  Cranial nerves II - XII are intact.  Motor and sensory functions are intact.  The gait is normal.  Assessment / Plan:

## 2010-08-10 NOTE — Assessment & Plan Note (Signed)
She complains of being very hot recently.  We'll check her TSH today.

## 2010-08-11 ENCOUNTER — Encounter: Payer: Medicare Other | Admitting: *Deleted

## 2010-08-11 NOTE — Progress Notes (Signed)
Called msg left that i will call tomorrow,

## 2010-08-12 ENCOUNTER — Encounter: Payer: Medicare Other | Admitting: *Deleted

## 2010-08-12 NOTE — Progress Notes (Signed)
Pt informed of lowering dose of thyroid med, pt c/o great heat intolerance. Plus pt not on any cholesterol meds, was stopped when she had blood clots 3 months ago by dr Jeannetta Nap. Please advise if needs endocrinologist and meds for cholesterol.i will call pt back.Alfonso Ramus RN

## 2010-08-13 ENCOUNTER — Other Ambulatory Visit: Payer: Self-pay | Admitting: *Deleted

## 2010-08-13 DIAGNOSIS — E785 Hyperlipidemia, unspecified: Secondary | ICD-10-CM

## 2010-08-13 MED ORDER — PRAVASTATIN SODIUM 40 MG PO TABS: 40.0000 mg | ORAL_TABLET | Freq: Every day | ORAL | Status: DC

## 2010-08-13 NOTE — Telephone Encounter (Signed)
Pt referred to dr Shela Commons. Sharl Ma for thyroid symptoms and heat intolerance. Pt to start back on pravastatin 40mg  and have labs in 3 mo,Pt verbalized understanding. Alfonso Ramus RN

## 2010-08-24 ENCOUNTER — Other Ambulatory Visit: Payer: Self-pay | Admitting: Cardiovascular Disease

## 2010-08-24 NOTE — Telephone Encounter (Signed)
escribe medication per fax request  

## 2010-08-26 ENCOUNTER — Ambulatory Visit (INDEPENDENT_AMBULATORY_CARE_PROVIDER_SITE_OTHER): Payer: Medicare Other | Admitting: *Deleted

## 2010-08-26 DIAGNOSIS — I82409 Acute embolism and thrombosis of unspecified deep veins of unspecified lower extremity: Secondary | ICD-10-CM

## 2010-09-16 ENCOUNTER — Ambulatory Visit (INDEPENDENT_AMBULATORY_CARE_PROVIDER_SITE_OTHER): Payer: Medicare Other | Admitting: *Deleted

## 2010-09-16 DIAGNOSIS — I82409 Acute embolism and thrombosis of unspecified deep veins of unspecified lower extremity: Secondary | ICD-10-CM

## 2010-10-08 ENCOUNTER — Telehealth: Payer: Self-pay | Admitting: Cardiovascular Disease

## 2010-10-08 MED ORDER — LEVOTHYROXINE SODIUM 25 MCG PO TABS: 25.0000 ug | ORAL_TABLET | Freq: Every day | ORAL | Status: DC

## 2010-10-08 NOTE — Telephone Encounter (Signed)
Pt is requesting refill levothyroxine 25 mcg

## 2010-10-08 NOTE — Telephone Encounter (Signed)
Patient request refill. Done, pt informedJodette Summers/ranger

## 2010-10-14 ENCOUNTER — Ambulatory Visit (INDEPENDENT_AMBULATORY_CARE_PROVIDER_SITE_OTHER): Payer: Medicare Other | Admitting: *Deleted

## 2010-10-14 DIAGNOSIS — I82409 Acute embolism and thrombosis of unspecified deep veins of unspecified lower extremity: Secondary | ICD-10-CM

## 2010-10-21 ENCOUNTER — Other Ambulatory Visit: Payer: Medicare Other | Admitting: *Deleted

## 2010-11-01 ENCOUNTER — Telehealth: Payer: Self-pay | Admitting: Cardiovascular Disease

## 2010-11-01 NOTE — Telephone Encounter (Signed)
Needs recent Lab Sent

## 2010-11-01 NOTE — Telephone Encounter (Signed)
Faxed recent labs today.

## 2010-11-11 ENCOUNTER — Other Ambulatory Visit (INDEPENDENT_AMBULATORY_CARE_PROVIDER_SITE_OTHER): Payer: Medicare Other | Admitting: *Deleted

## 2010-11-11 ENCOUNTER — Other Ambulatory Visit: Payer: Self-pay | Admitting: Cardiovascular Disease

## 2010-11-11 ENCOUNTER — Ambulatory Visit (INDEPENDENT_AMBULATORY_CARE_PROVIDER_SITE_OTHER): Payer: Medicare Other | Admitting: *Deleted

## 2010-11-11 DIAGNOSIS — I82409 Acute embolism and thrombosis of unspecified deep veins of unspecified lower extremity: Secondary | ICD-10-CM

## 2010-11-11 DIAGNOSIS — E785 Hyperlipidemia, unspecified: Secondary | ICD-10-CM

## 2010-11-11 LAB — POCT INR: INR: 3.6

## 2010-11-11 LAB — BASIC METABOLIC PANEL
BUN: 18 mg/dL (ref 6–23)
CO2: 29 mEq/L (ref 19–32)
Chloride: 104 mEq/L (ref 96–112)
Creatinine, Ser: 0.9 mg/dL (ref 0.4–1.2)
Glucose, Bld: 131 mg/dL — ABNORMAL HIGH (ref 70–99)

## 2010-11-11 LAB — HEPATIC FUNCTION PANEL: Albumin: 3.9 g/dL (ref 3.5–5.2)

## 2010-11-11 LAB — LIPID PANEL: Total CHOL/HDL Ratio: 5

## 2010-11-11 LAB — LDL CHOLESTEROL, DIRECT: Direct LDL: 102.7 mg/dL

## 2010-11-12 ENCOUNTER — Other Ambulatory Visit: Payer: Self-pay | Admitting: *Deleted

## 2010-11-12 DIAGNOSIS — E785 Hyperlipidemia, unspecified: Secondary | ICD-10-CM

## 2010-11-25 ENCOUNTER — Ambulatory Visit (INDEPENDENT_AMBULATORY_CARE_PROVIDER_SITE_OTHER): Payer: Medicare Other | Admitting: *Deleted

## 2010-11-25 DIAGNOSIS — I82409 Acute embolism and thrombosis of unspecified deep veins of unspecified lower extremity: Secondary | ICD-10-CM

## 2010-11-25 LAB — POCT INR: INR: 3.5

## 2010-12-09 ENCOUNTER — Ambulatory Visit (INDEPENDENT_AMBULATORY_CARE_PROVIDER_SITE_OTHER): Payer: Medicare Other | Admitting: *Deleted

## 2010-12-09 DIAGNOSIS — I82409 Acute embolism and thrombosis of unspecified deep veins of unspecified lower extremity: Secondary | ICD-10-CM

## 2010-12-09 LAB — POCT INR: INR: 2

## 2010-12-19 ENCOUNTER — Other Ambulatory Visit: Payer: Self-pay | Admitting: Cardiovascular Disease

## 2010-12-28 ENCOUNTER — Ambulatory Visit (INDEPENDENT_AMBULATORY_CARE_PROVIDER_SITE_OTHER): Payer: Medicare Other | Admitting: *Deleted

## 2010-12-28 DIAGNOSIS — I82409 Acute embolism and thrombosis of unspecified deep veins of unspecified lower extremity: Secondary | ICD-10-CM

## 2011-01-25 ENCOUNTER — Ambulatory Visit (INDEPENDENT_AMBULATORY_CARE_PROVIDER_SITE_OTHER): Payer: Medicare Other | Admitting: *Deleted

## 2011-01-25 ENCOUNTER — Telehealth: Payer: Self-pay

## 2011-01-25 DIAGNOSIS — I82409 Acute embolism and thrombosis of unspecified deep veins of unspecified lower extremity: Secondary | ICD-10-CM

## 2011-01-25 DIAGNOSIS — I2699 Other pulmonary embolism without acute cor pulmonale: Secondary | ICD-10-CM

## 2011-01-25 DIAGNOSIS — I509 Heart failure, unspecified: Secondary | ICD-10-CM

## 2011-01-25 NOTE — Telephone Encounter (Signed)
MSG to call back and to increase lasix 40 mg bid and k+ 10 meq bid and labs on Friday morning, pt to call back to confirm. Pt called back and confirmed she will double her doses for 3 days and have labs Friday. Pt to call and inform how she feels if worse or go to er. Pt was able to hold conversation and didn't sound SOB, she is to weigh daily and weight should come down.

## 2011-01-25 NOTE — Telephone Encounter (Signed)
Pt reports weight being up 3.5 lbs today, has been experiencing swelling in feet and hands x 2 weeks.  Swelling worse at end of the day, but still present in am when wakes.  Experiencing some SOB even at rest, feels like she has to take deep breath at times to catch her breath x several weeks. Pt reports some sinus congestion and drainage as well.  Pt takes Furosemide 40mg  daily at present.  Pt scheduled for OV with Dr Melburn Popper on 02/10/11 please call and advise if needs to adjust meds, or be seen sooner.  Thanks

## 2011-01-28 ENCOUNTER — Other Ambulatory Visit (INDEPENDENT_AMBULATORY_CARE_PROVIDER_SITE_OTHER): Payer: Medicare Other | Admitting: *Deleted

## 2011-01-28 ENCOUNTER — Telehealth: Payer: Self-pay | Admitting: *Deleted

## 2011-01-28 DIAGNOSIS — R0609 Other forms of dyspnea: Secondary | ICD-10-CM

## 2011-01-28 DIAGNOSIS — R0989 Other specified symptoms and signs involving the circulatory and respiratory systems: Secondary | ICD-10-CM

## 2011-01-28 DIAGNOSIS — I509 Heart failure, unspecified: Secondary | ICD-10-CM

## 2011-01-28 LAB — BASIC METABOLIC PANEL
CO2: 30 mEq/L (ref 19–32)
Calcium: 9.3 mg/dL (ref 8.4–10.5)
Chloride: 102 mEq/L (ref 96–112)
Creatinine, Ser: 1 mg/dL (ref 0.4–1.2)
Glucose, Bld: 147 mg/dL — ABNORMAL HIGH (ref 70–99)

## 2011-01-28 NOTE — Telephone Encounter (Signed)
Wanted to see how pt was feeling. Called and left msg to call with how feeling.

## 2011-02-10 ENCOUNTER — Ambulatory Visit (INDEPENDENT_AMBULATORY_CARE_PROVIDER_SITE_OTHER): Payer: Medicare Other | Admitting: *Deleted

## 2011-02-10 ENCOUNTER — Other Ambulatory Visit: Payer: Self-pay | Admitting: Cardiovascular Disease

## 2011-02-10 ENCOUNTER — Encounter: Payer: Self-pay | Admitting: Cardiovascular Disease

## 2011-02-10 ENCOUNTER — Ambulatory Visit (INDEPENDENT_AMBULATORY_CARE_PROVIDER_SITE_OTHER): Payer: Medicare Other | Admitting: Cardiovascular Disease

## 2011-02-10 ENCOUNTER — Other Ambulatory Visit (INDEPENDENT_AMBULATORY_CARE_PROVIDER_SITE_OTHER): Payer: Medicare Other | Admitting: *Deleted

## 2011-02-10 DIAGNOSIS — I2699 Other pulmonary embolism without acute cor pulmonale: Secondary | ICD-10-CM

## 2011-02-10 DIAGNOSIS — E039 Hypothyroidism, unspecified: Secondary | ICD-10-CM

## 2011-02-10 DIAGNOSIS — I82409 Acute embolism and thrombosis of unspecified deep veins of unspecified lower extremity: Secondary | ICD-10-CM

## 2011-02-10 DIAGNOSIS — E785 Hyperlipidemia, unspecified: Secondary | ICD-10-CM

## 2011-02-10 LAB — LIPID PANEL
HDL: 40.4 mg/dL (ref >39.00)
VLDL: 58.4 mg/dL — ABNORMAL HIGH (ref 0.0–40.0)

## 2011-02-10 LAB — HEPATIC FUNCTION PANEL
Alkaline Phosphatase: 49 U/L (ref 39–117)
Bilirubin, Direct: 0 mg/dL (ref 0.0–0.3)
Total Bilirubin: 0.4 mg/dL (ref 0.3–1.2)
Total Protein: 7.9 g/dL (ref 6.0–8.3)

## 2011-02-10 LAB — BASIC METABOLIC PANEL
BUN: 17 mg/dL (ref 6–23)
CO2: 31 mEq/L (ref 19–32)
GFR: 64.75 mL/min (ref >60.00)
Glucose, Bld: 116 mg/dL — ABNORMAL HIGH (ref 70–99)
Potassium: 4 mEq/L (ref 3.5–5.1)

## 2011-02-10 NOTE — Assessment & Plan Note (Signed)
Continue with Synthroid 

## 2011-02-10 NOTE — Patient Instructions (Signed)
Your physician recommends that you schedule a follow-up appointment in 3 months/ call if need sooner  Your physician recommends that you have fasting labs today we will call you with results in 3-5 days

## 2011-02-10 NOTE — Assessment & Plan Note (Signed)
She was diagnosed as having pulmonary him earlier this year. I do not have any clear cut explanation of why she had a pulmonary embolus. I think that she'll probably need to stay on anticoagulation long term.  She continues to have some shortness of breath but this is gradually getting better. I told her that it may take some time for a completely improved. I've asked her to continue with a good diet and exercise program in an effort to lose weight which I think will also help her.  If her shortness of breath acutely worsens she is to call me right away. We'll need to make sure that she is therapeutic on her Coumadin. We'll also need to repeat her stress Myoview study.

## 2011-02-10 NOTE — Progress Notes (Signed)
Virginia Summers Date of Birth  [birth year] [facility] [address]    [address] [place], [place]  [address] [phone]  Fax  [phone]  History of Present Illness:  Virginia Summers is a 70 year old female with a history of hypothyroidism. She has a history of bilateral DVTs with pulmonary and blood. She was started on Coumadin after her diagnosis in March of 2012.  Recently she's been having some bilateral leg swelling as well as some increasing dyspnea.  The symptoms that she started in March after her pulmonary bullous. These symptoms are persistent and have not really worsened since that time. She's exercising on a regular basis.   Current Outpatient Prescriptions on File Prior to Visit  Medication Sig Dispense Refill  . albuterol (PROVENTIL HFA) 108 (90 BASE) MCG/ACT inhaler Inhale 2 puffs into the lungs every 4 (four) hours as needed.        Marland Kitchen amLODipine (NORVASC) 5 MG tablet Take 5 mg by mouth daily.        . carvedilol (COREG) 25 MG tablet Take 1 tablet (25 mg total) by mouth 2 (two) times daily.  60 tablet  11  . fish oil-omega-3 fatty acids 1000 MG capsule Take 1 g by mouth daily.        . furosemide (LASIX) 40 MG tablet Take 40 mg by mouth daily.        Marland Kitchen levothyroxine (SYNTHROID, LEVOTHROID) 25 MCG tablet Take 1 tablet (25 mcg total) by mouth daily.  30 tablet  5  . lisinopril (PRINIVIL,ZESTRIL) 40 MG tablet Take 40 mg by mouth daily.        Marland Kitchen loratadine (CLARITIN) 10 MG tablet Take 10 mg by mouth daily.        . Multiple Vitamin (MULTIVITAMIN) tablet Take 1 tablet by mouth daily.        Marland Kitchen omeprazole (PRILOSEC) 20 MG capsule Take 20 mg by mouth daily.        . potassium chloride (KLOR-CON) 10 MEQ CR tablet Take 10 mEq by mouth daily.        . potassium chloride (MICRO-K) 10 MEQ CR capsule TAKE ONE CAPSULE BY MOUTH EVERY DAY  30 capsule  6  . pravastatin (PRAVACHOL) 40 MG tablet Take 1 tablet (40 mg total) by mouth daily.  30 tablet  11  . traMADol (ULTRAM) 50 MG tablet  Take 50 mg by mouth every 4 (four) hours as needed. May take one to two tablets, up to 8/day       . warfarin (COUMADIN) 5 MG tablet Take as directed  45 tablet  11    Allergies  Allergen Reactions  . Penicillins Swelling and Rash  . Sulfa Drugs Cross Reactors Swelling and Rash    Past Medical History  Diagnosis Date  . DVT (deep venous thrombosis)     BILATERAL, March 2012  . Pulmonary embolism   . HTN (hypertension)   . Diabetes mellitus   . Chest pain   . Hypothyroidism   . GERD (gastroesophageal reflux disease)   . Diverticulitis   . Anticoagulant long-term use     Past Surgical History  Procedure Date  . Back surgery 2000    DISC  . Partial hysterectomy AGE 53's APPROX.  Marland Kitchen Appendectomy AGE 59  . Shoulder surgery 5-6 YRS OLD    PINS  . Secondary intraocular lense implantation 3244,0102    OU    History  Smoking status  . Former Smoker -- 0.5 packs/day for  30 years  . Types: Cigarettes  . Quit date: 03/15/2003  Smokeless tobacco  . Never Used    History  Alcohol Use No    Family History  Problem Relation Age of Onset  . Transient ischemic attack Father   . Coronary artery disease Father   . Stroke Mother   . Diabetes Mother   . Diabetes Sister   . Hypertension Daughter     Reviw of Systems:  Reviewed in the HPI.  All other systems are negative.  Physical Exam: BP 149/73  Pulse 62  Ht 4\' 11"  (1.499 m)  Wt 187 lb 1.9 oz (84.877 kg)  BMI 37.79 kg/m2 The patient is alert and oriented x 3.  The mood and affect are normal.   Skin: warm and dry.  Color is normal.    HEENT:   Normocephalic/atraumatic. She has no JVD. Her carotids are normal.  Lungs: Her lungs are clear.   Heart: Heart regular rate S1-S2.    Abdomen: Shows good bowel sounds. There is no hepatosplenomegaly.  Extremities:  Trace leg edema  Neuro:  There exam is nonfocal.    ECG: Normal sinus rhythm. His left axis deviation. There is a nonspecific IVCD.  Assessment / Plan:

## 2011-02-11 ENCOUNTER — Other Ambulatory Visit: Payer: Self-pay | Admitting: *Deleted

## 2011-02-11 DIAGNOSIS — E785 Hyperlipidemia, unspecified: Secondary | ICD-10-CM

## 2011-02-11 MED ORDER — ATORVASTATIN CALCIUM 40 MG PO TABS: 40.0000 mg | ORAL_TABLET | Freq: Every day | ORAL | Status: DC

## 2011-02-11 NOTE — Telephone Encounter (Signed)
Pt will stop pravastatin and start lipitor, 3 mo app for ov made and will have labs that day. Pt aware.

## 2011-03-10 ENCOUNTER — Ambulatory Visit (INDEPENDENT_AMBULATORY_CARE_PROVIDER_SITE_OTHER): Payer: Medicare Other | Admitting: *Deleted

## 2011-03-10 DIAGNOSIS — I2699 Other pulmonary embolism without acute cor pulmonale: Secondary | ICD-10-CM

## 2011-03-10 DIAGNOSIS — I82409 Acute embolism and thrombosis of unspecified deep veins of unspecified lower extremity: Secondary | ICD-10-CM

## 2011-03-10 LAB — POCT INR: INR: 2.1

## 2011-04-04 ENCOUNTER — Other Ambulatory Visit: Payer: Self-pay | Admitting: *Deleted

## 2011-04-04 MED ORDER — LEVOTHYROXINE SODIUM 25 MCG PO TABS: 25.0000 ug | ORAL_TABLET | Freq: Every day | ORAL | Status: DC

## 2011-04-04 MED ORDER — AMLODIPINE BESYLATE 5 MG PO TABS: 5.0000 mg | ORAL_TABLET | Freq: Every day | ORAL | Status: DC

## 2011-04-07 ENCOUNTER — Encounter: Payer: Medicare Other | Admitting: *Deleted

## 2011-04-07 ENCOUNTER — Ambulatory Visit (INDEPENDENT_AMBULATORY_CARE_PROVIDER_SITE_OTHER): Payer: Medicare Other | Admitting: *Deleted

## 2011-04-07 DIAGNOSIS — I2699 Other pulmonary embolism without acute cor pulmonale: Secondary | ICD-10-CM

## 2011-04-07 DIAGNOSIS — I82409 Acute embolism and thrombosis of unspecified deep veins of unspecified lower extremity: Secondary | ICD-10-CM

## 2011-04-07 LAB — POCT INR: INR: 2.4

## 2011-05-12 ENCOUNTER — Ambulatory Visit (INDEPENDENT_AMBULATORY_CARE_PROVIDER_SITE_OTHER): Payer: Medicare Other | Admitting: Cardiovascular Disease

## 2011-05-12 ENCOUNTER — Ambulatory Visit (INDEPENDENT_AMBULATORY_CARE_PROVIDER_SITE_OTHER): Payer: Medicare Other

## 2011-05-12 ENCOUNTER — Encounter: Payer: Self-pay | Admitting: Cardiovascular Disease

## 2011-05-12 VITALS — BP 130/60 | HR 68 | Ht 59.0 in | Wt 189.0 lb

## 2011-05-12 DIAGNOSIS — I2699 Other pulmonary embolism without acute cor pulmonale: Secondary | ICD-10-CM

## 2011-05-12 DIAGNOSIS — I10 Essential (primary) hypertension: Secondary | ICD-10-CM

## 2011-05-12 DIAGNOSIS — K449 Diaphragmatic hernia without obstruction or gangrene: Secondary | ICD-10-CM

## 2011-05-12 DIAGNOSIS — I82409 Acute embolism and thrombosis of unspecified deep veins of unspecified lower extremity: Secondary | ICD-10-CM

## 2011-05-12 DIAGNOSIS — E785 Hyperlipidemia, unspecified: Secondary | ICD-10-CM

## 2011-05-12 LAB — BASIC METABOLIC PANEL
Calcium: 9.1 mg/dL (ref 8.4–10.5)
GFR: 64.71 mL/min (ref >60.00)
Glucose, Bld: 118 mg/dL — ABNORMAL HIGH (ref 70–99)
Sodium: 140 mEq/L (ref 135–145)

## 2011-05-12 LAB — POCT INR: INR: 2.2

## 2011-05-12 LAB — LIPID PANEL
HDL: 40.7 mg/dL (ref >39.00)
Triglycerides: 240 mg/dL — ABNORMAL HIGH (ref 0.0–149.0)

## 2011-05-12 LAB — HEPATIC FUNCTION PANEL
Albumin: 4 g/dL (ref 3.5–5.2)
Alkaline Phosphatase: 52 U/L (ref 39–117)
Total Bilirubin: 0.5 mg/dL (ref 0.3–1.2)

## 2011-05-12 NOTE — Progress Notes (Signed)
Virginia Summers Date of Birth  09-04-1940 [facility]  [address]    [address]   [address] [place], [place]  [address]    [place], [place]  [address] [phone]  Fax  [phone]  [phone]  Fax [phone]  Problem list: 1. Hypothyroidism 2. Deep vein thrombosis 3. Pulmonary embolus 4. Hypertension 5. Chest pain - normal cath 2008  History of Present Illness:  She has continued to have severe DOE.  This has been present since her PE.  She has noticed loss of indigestion-like pain for the past several months.  This happens typically at night when she is lying down.  She has been trying to lose weight but has not been able to lose weight consistently.  Current Outpatient Prescriptions on File Prior to Visit  Medication Sig Dispense Refill  . albuterol (PROVENTIL HFA) 108 (90 BASE) MCG/ACT inhaler Inhale 2 puffs into the lungs every 4 (four) hours as needed.        Marland Kitchen amLODipine (NORVASC) 5 MG tablet Take 1 tablet (5 mg total) by mouth daily.  90 tablet  3  . atorvastatin (LIPITOR) 40 MG tablet Take 1 tablet (40 mg total) by mouth daily.  30 tablet  11  . carvedilol (COREG) 25 MG tablet Take 1 tablet (25 mg total) by mouth 2 (two) times daily.  60 tablet  11  . fish oil-omega-3 fatty acids 1000 MG capsule Take 1 g by mouth daily.        . furosemide (LASIX) 40 MG tablet Take 40 mg by mouth daily.        Marland Kitchen levothyroxine (SYNTHROID, LEVOTHROID) 25 MCG tablet Take 1 tablet (25 mcg total) by mouth daily.  30 tablet  5  . lisinopril (PRINIVIL,ZESTRIL) 40 MG tablet Take 40 mg by mouth daily.        Marland Kitchen loratadine (CLARITIN) 10 MG tablet Take 10 mg by mouth daily.        . Multiple Vitamin (MULTIVITAMIN) tablet Take 1 tablet by mouth daily.        Marland Kitchen omeprazole (PRILOSEC) 20 MG capsule Take 20 mg by mouth daily.        . potassium chloride (MICRO-K) 10 MEQ CR capsule TAKE ONE CAPSULE BY MOUTH EVERY DAY  30 capsule  6  . traMADol (ULTRAM) 50 MG  tablet Take 50 mg by mouth every 4 (four) hours as needed. May take one to two tablets, up to 8/day       . warfarin (COUMADIN) 5 MG tablet Take as directed  45 tablet  11    Allergies  Allergen Reactions  . Penicillins Swelling and Rash  . Sulfa Drugs Cross Reactors Swelling and Rash    Past Medical History  Diagnosis Date  . DVT (deep venous thrombosis)     BILATERAL, March 2012  . Pulmonary embolism   . HTN (hypertension)   . Diabetes mellitus   . Chest pain   . Hypothyroidism   . GERD (gastroesophageal reflux disease)   . Diverticulitis   . Anticoagulant long-term use     Past Surgical History  Procedure Date  . Back surgery 2000    DISC  . Partial hysterectomy AGE 48's APPROX.  Marland Kitchen Appendectomy AGE 38  . Shoulder surgery 5-6 YRS OLD    PINS  . Secondary intraocular lense implantation [id number]    OU    History  Smoking status  . Former Smoker --  0.5 packs/day for 30 years  . Types: Cigarettes  . Quit date: 03/15/2003  Smokeless tobacco  . Never Used    History  Alcohol Use No    Family History  Problem Relation Age of Onset  . Transient ischemic attack Father   . Coronary artery disease Father   . Stroke Mother   . Diabetes Mother   . Diabetes Sister   . Hypertension Daughter     Reviw of Systems:  Reviewed in the HPI.  All other systems are negative.  Physical Exam: Blood pressure 144/69, pulse 68, height 4\' 11"  (1.499 m), weight 189 lb (85.73 kg). General: Well developed, well nourished, in no acute distress.  Head: Normocephalic, atraumatic, sclera non-icteric, mucus membranes are moist,   Neck: Supple. Carotids are 2 + without bruits. No JVD  Lungs: Clear bilaterally to auscultation.  She has loud bowel sounds in her mid chest region - c/w hiatal hernia.  Heart: regular rate  With normal  S1 S2. No murmurs, gallops or rubs.  Abdomen: Soft, non-tender, non-distended with normal bowel sounds. No hepatomegaly. No rebound/guarding. No  masses.  Msk:  Strength and tone are normal  Extremities: No clubbing or cyanosis. No edema.  Distal pedal pulses are 2+ and equal bilaterally.  Neuro: Alert and oriented X 3. Moves all extremities spontaneously.  Psych:  Responds to questions appropriately with a normal affect.  ECG:  Assessment / Plan:

## 2011-05-12 NOTE — Assessment & Plan Note (Signed)
She has persistent dyspnea with exertion and also has some rather unusual chest pains. It is certainly possible that these are from her pulmonary illness. She'll continue to take Coumadin. We'll check an INR today.

## 2011-05-12 NOTE — Assessment & Plan Note (Signed)
She is  symptoms of chest pain which last down at night. She has good bowel sounds up into her chest was I suspect is due to a hiatal hernia.  She already takes omeprazole. She may need to see her medical doctor or perhaps a gastroenterologist for further assessment of what I think is a hiatal hernia.

## 2011-05-12 NOTE — Assessment & Plan Note (Signed)
His blood pressure seems to be fairly well controlled. She avoids eating extra salt. See her back in the office in 6 months.

## 2011-05-12 NOTE — Patient Instructions (Addendum)
Your physician wants you to follow-up in: 6 months  You will receive a reminder letter in the mail two months in advance. If you don't receive a letter, please call our office to schedule the follow-up appointment.   Your physician recommends that you continue on your current medications as directed. Please refer to the Current Medication list given to you today.   Your physician recommends that you return for a FASTING lipid profile: today  

## 2011-06-23 ENCOUNTER — Ambulatory Visit (INDEPENDENT_AMBULATORY_CARE_PROVIDER_SITE_OTHER): Payer: Medicare Other

## 2011-06-23 DIAGNOSIS — I82409 Acute embolism and thrombosis of unspecified deep veins of unspecified lower extremity: Secondary | ICD-10-CM

## 2011-06-23 DIAGNOSIS — I2699 Other pulmonary embolism without acute cor pulmonale: Secondary | ICD-10-CM

## 2011-07-22 ENCOUNTER — Other Ambulatory Visit: Payer: Self-pay

## 2011-07-22 MED ORDER — POTASSIUM CHLORIDE ER 10 MEQ PO CPCR: 10.0000 meq | ORAL_CAPSULE | Freq: Every day | ORAL | Status: DC

## 2011-08-03 ENCOUNTER — Encounter: Payer: Self-pay | Admitting: Cardiovascular Disease

## 2011-08-03 LAB — PROTIME-INR: INR: 2 — AB (ref 0.9–1.1)

## 2011-08-04 ENCOUNTER — Ambulatory Visit: Payer: Self-pay | Admitting: Cardiology

## 2011-08-04 DIAGNOSIS — I2699 Other pulmonary embolism without acute cor pulmonale: Secondary | ICD-10-CM

## 2011-08-12 ENCOUNTER — Other Ambulatory Visit: Payer: Self-pay | Admitting: Cardiovascular Disease

## 2011-08-12 ENCOUNTER — Other Ambulatory Visit: Payer: Self-pay | Admitting: *Deleted

## 2011-08-12 MED ORDER — CARVEDILOL 25 MG PO TABS: 25.0000 mg | ORAL_TABLET | Freq: Two times a day (BID) | ORAL | Status: DC

## 2011-08-12 NOTE — Telephone Encounter (Signed)
Opened in Error.

## 2011-08-18 ENCOUNTER — Emergency Department (HOSPITAL_COMMUNITY): Payer: Medicare Other

## 2011-08-18 ENCOUNTER — Emergency Department (HOSPITAL_COMMUNITY)
Admission: EM | Admit: 2011-08-18 | Discharge: 2011-08-18 | Disposition: A | Discharge status: Home / Self Care | Payer: Medicare Other | Attending: Emergency Medicine | Admitting: Emergency Medicine

## 2011-08-18 ENCOUNTER — Encounter (HOSPITAL_COMMUNITY): Payer: Self-pay | Admitting: *Deleted

## 2011-08-18 DIAGNOSIS — I10 Essential (primary) hypertension: Secondary | ICD-10-CM | POA: Insufficient documentation

## 2011-08-18 DIAGNOSIS — M7989 Other specified soft tissue disorders: Secondary | ICD-10-CM | POA: Insufficient documentation

## 2011-08-18 DIAGNOSIS — Z7901 Long term (current) use of anticoagulants: Secondary | ICD-10-CM | POA: Insufficient documentation

## 2011-08-18 DIAGNOSIS — E119 Type 2 diabetes mellitus without complications: Secondary | ICD-10-CM | POA: Insufficient documentation

## 2011-08-18 DIAGNOSIS — S82892A Other fracture of left lower leg, initial encounter for closed fracture: Secondary | ICD-10-CM

## 2011-08-18 DIAGNOSIS — S82899A Other fracture of unspecified lower leg, initial encounter for closed fracture: Secondary | ICD-10-CM | POA: Insufficient documentation

## 2011-08-18 DIAGNOSIS — W010XXA Fall on same level from slipping, tripping and stumbling without subsequent striking against object, initial encounter: Secondary | ICD-10-CM | POA: Insufficient documentation

## 2011-08-18 DIAGNOSIS — Y92009 Unspecified place in unspecified non-institutional (private) residence as the place of occurrence of the external cause: Secondary | ICD-10-CM | POA: Insufficient documentation

## 2011-08-18 DIAGNOSIS — Z86718 Personal history of other venous thrombosis and embolism: Secondary | ICD-10-CM | POA: Insufficient documentation

## 2011-08-18 MED ORDER — OXYCODONE-ACETAMINOPHEN 5-325 MG PO TABS
1.0000 | ORAL_TABLET | Freq: Once | ORAL | Status: AC
  Administered 2011-08-18: 1 via ORAL
  Filled 2011-08-18: qty 1

## 2011-08-18 MED ORDER — HYDROCODONE-ACETAMINOPHEN 5-325 MG PO TABS: 1.0000 | ORAL_TABLET | ORAL | Status: AC | PRN

## 2011-08-18 NOTE — ED Notes (Signed)
Pt taking out trash and slipped on bank; injuring left ankle; swollen

## 2011-08-18 NOTE — ED Notes (Signed)
Ortho tech called for placement of cam walker and crutch fitting.

## 2011-08-18 NOTE — ED Provider Notes (Signed)
History     CSN: [id number]  Arrival date & time 08/18/11  1949   First MD Initiated Contact with Patient 08/18/11 2119      Chief Complaint  Patient presents with  . Fall  . Ankle Pain    (Consider location/radiation/quality/duration/timing/severity/associated sxs/prior treatment) HPI  71 year old female presents complaining of left ankle injury. Patient states she was carrying the trash out when she slipped her left ankle on wet floor and felt a pop. She fell backward and landed on her buttocks.  She denies hitting head or LOC.  Denies any other injury.  Notice significant pain to L ankle and unable to bear weight on it.  Onset acute, pain is sharp and throbbing and radiate upward to lower leg.  Denies knee or hip pain.  Denies numbness.  Pt does have a hx of PE and currently taking coumadin.   Past Medical History  Diagnosis Date  . DVT (deep venous thrombosis)     BILATERAL, March 2012  . Pulmonary embolism   . HTN (hypertension)   . Diabetes mellitus   . Chest pain   . Hypothyroidism   . GERD (gastroesophageal reflux disease)   . Diverticulitis   . Anticoagulant long-term use     Past Surgical History  Procedure Date  . Back surgery 2000    DISC  . Partial hysterectomy AGE 73's APPROX.  Marland Kitchen Appendectomy AGE 49  . Shoulder surgery 5-6 YRS OLD    PINS  . Secondary intraocular lense implantation [id number]    OU    Family History  Problem Relation Age of Onset  . Transient ischemic attack Father   . Coronary artery disease Father   . Stroke Mother   . Diabetes Mother   . Diabetes Sister   . Hypertension Daughter     History  Substance Use Topics  . Smoking status: Former Smoker -- 0.5 packs/day for 30 years    Types: Cigarettes    Quit date: 03/15/2003  . Smokeless tobacco: Never Used  . Alcohol Use: No    OB History    Grav Para Term Preterm Abortions TAB SAB Ect Mult Living                  Review of Systems  Constitutional: Negative for fever.    HENT: Negative for neck pain.   Musculoskeletal: Positive for joint swelling. Negative for back pain.  Skin: Negative for color change.  Neurological: Negative for headaches.    Allergies  Penicillins and Sulfa drugs cross reactors  Home Medications   Current Outpatient Rx  Name Route Sig Dispense Refill  . ACETAMINOPHEN 500 MG PO TABS Oral Take 1,000 mg by mouth every 6 (six) hours as needed. For pain.    . ALBUTEROL SULFATE HFA 108 (90 BASE) MCG/ACT IN AERS Inhalation Inhale 2 puffs into the lungs every 4 (four) hours as needed. For shortness of breath.    . AMLODIPINE BESYLATE 5 MG PO TABS Oral Take 1 tablet (5 mg total) by mouth daily. 90 tablet 3  . ATORVASTATIN CALCIUM 40 MG PO TABS Oral Take 1 tablet (40 mg total) by mouth daily. 30 tablet 11  . CARVEDILOL 25 MG PO TABS Oral Take 1 tablet (25 mg total) by mouth 2 (two) times daily. 60 tablet 5    90 day supply is acceptable  . OMEGA-3 FATTY ACIDS 1000 MG PO CAPS Oral Take 1 g by mouth daily.      . FUROSEMIDE 40 MG  PO TABS Oral Take 40 mg by mouth daily.      Marland Kitchen LEVOTHYROXINE SODIUM 25 MCG PO TABS Oral Take 1 tablet (25 mcg total) by mouth daily. 30 tablet 5  . LISINOPRIL 40 MG PO TABS Oral Take 40 mg by mouth daily.      Marland Kitchen LORATADINE 10 MG PO TABS Oral Take 10 mg by mouth daily as needed. For allergies.    . OMEPRAZOLE 20 MG PO CPDR Oral Take 20 mg by mouth daily.      Marland Kitchen POTASSIUM CHLORIDE ER 10 MEQ PO CPCR Oral Take 1 capsule (10 mEq total) by mouth daily. 30 capsule 11  . TRAMADOL HCL 50 MG PO TABS Oral Take 50 mg by mouth every 6 (six) hours as needed. For pain.    . WARFARIN SODIUM 5 MG PO TABS Oral Take 2.5-5 mg by mouth See admin instructions. 1 tab daily except for 0.5 tab daily on Mondays and Fridays.      BP 184/51  Pulse 84  Temp(Src) 98.7 F (37.1 C) (Oral)  Resp 18  Wt 182 lb (82.555 kg)  SpO2 97%  Physical Exam  Nursing note and vitals reviewed. Constitutional: She is oriented to person, place, and time.  She appears well-developed and well-nourished. No distress.  HENT:  Head: Normocephalic and atraumatic.  Eyes: Conjunctivae are normal.  Neck: Normal range of motion. Neck supple.  Musculoskeletal:       Left hip: Normal.       Left knee: Normal.       Left ankle: She exhibits decreased range of motion and swelling. She exhibits no ecchymosis, no deformity, no laceration and normal pulse. tenderness. Lateral malleolus, medial malleolus and CF ligament tenderness found. No posterior TFL, no head of 5th metatarsal and no proximal fibula tenderness found.       Feet:  Neurological: She is alert and oriented to person, place, and time.  Skin: Skin is warm. No rash noted.    ED Course  Procedures (including critical care time)  Labs Reviewed - No data to display Dg Ankle Complete Left  08/18/2011  *RADIOLOGY REPORT*  Clinical Data: Fall, ankle pain.  LEFT ANKLE COMPLETE - 3+ VIEW  Comparison: None.  Findings: There is an oblique minimally-displaced fracture through the distal left fibula at the ankle mortise.  Suspect small avulsed fragments off the tip of the medial malleolus.  Diffuse soft tissue swelling.  Plantar calcaneal spur.  IMPRESSION: Oblique minimally-displaced distal left fibular fracture.  Suspect small avulsed fragments off the medial malleolus.  Original Report Authenticated By: Cyndie Chime, M.D.     No diagnosis found.    MDM  L ankle injury, is NVI, brisk cap refills, pedal pulses 2+.  Xray shows an oblique minimally displaced distal left fibular fx and a suspect small avulsed fragments of the medial malleolus.  No proximal fibula tenderness and no head of 5th MT tenderness.    Will apply cam walker and crutches with ortho f/u.  Discussed with my attending.          Fayrene Helper, PA-C 08/18/11 2211

## 2011-08-18 NOTE — Discharge Instructions (Signed)
You have a mildly displaced oblique distal fibular fracture and a suspect avulsion fracture of your distal tibia in your left ankle.  Wear cam walker and ambulate with crutches.  Do not bear any weight when walking.  Take pain medication as needed for pain.  Follow up with Dr. Lequita Halt for further care.    Ankle Fracture A fracture is a break in the bone. A cast or splint is used to protect and keep your injured bone from moving.  HOME CARE INSTRUCTIONS   Use your crutches as directed.   To lessen the swelling, keep the injured leg elevated while sitting or lying down.   Apply ice to the injury for 15 to 20 minutes, 3 to 4 times per day while awake for 2 days. Put the ice in a plastic bag and place a thin towel between the bag of ice and your cast.   If you have a plaster or fiberglass cast:   Do not try to scratch the skin under the cast using sharp or pointed objects.   Check the skin around the cast every day. You may put lotion on any red or sore areas.   Keep your cast dry and clean.   If you have a plaster splint:   Wear the splint as directed.   You may loosen the elastic around the splint if your toes become numb, tingle, or turn cold or blue.   Do not put pressure on any part of your cast or splint; it may break. Rest your cast only on a pillow the first 24 hours until it is fully hardened.   Your cast or splint can be protected during bathing with a plastic bag. Do not lower the cast or splint into water.   Take medications as directed by your caregiver. Only take over-the-counter or prescription medicines for pain, discomfort, or fever as directed by your caregiver.   Do not drive a vehicle until your caregiver specifically tells you it is safe to do so.   If your caregiver has given you a follow-up appointment, it is very important to keep that appointment. Not keeping the appointment could result in a chronic or permanent injury, pain, and disability. If there is any  problem keeping the appointment, you must call back to this facility for assistance.  SEEK IMMEDIATE MEDICAL CARE IF:   Your cast gets damaged or breaks.   You have continued severe pain or more swelling than you did before the cast was put on.   Your skin or toenails below the injury turn blue or gray, or feel cold or numb.   There is a bad smell or new stains and/or purulent (pus like) drainage coming from under the cast.  If you do not have a window in your cast for observing the wound, a discharge or minor bleeding may show up as a stain on the outside of your cast. Report these findings to your caregiver. MAKE SURE YOU:   Understand these instructions.   Will watch your condition.   Will get help right away if you are not doing well or get worse.  Document Released: 02/26/2000 Document Revised: 02/17/2011 Document Reviewed: 10/02/2007 [facility] Patient Information 2012 [place], [place].

## 2011-08-18 NOTE — ED Notes (Signed)
Ortho tech at bedside for placement of cam walker and fitting of crutches and instruction.

## 2011-08-18 NOTE — ED Notes (Signed)
Pt is in pain.  

## 2011-08-19 NOTE — ED Provider Notes (Signed)
Medical screening examination/treatment/procedure(s) were conducted as a shared visit with non-physician practitioner(s) and myself.  I personally evaluated the patient during the encounter.  X-ray reviewed.  Major injuries distal fib fracture. Will mobilize with Cam Walker and crutches. Followup orthopedics  Donnetta Hutching, MD 08/19/11 1517

## 2011-09-08 ENCOUNTER — Other Ambulatory Visit: Payer: Self-pay

## 2011-09-08 MED ORDER — WARFARIN SODIUM 5 MG PO TABS: ORAL_TABLET | ORAL | Status: DC

## 2011-10-05 ENCOUNTER — Other Ambulatory Visit: Payer: Self-pay | Admitting: *Deleted

## 2011-10-05 MED ORDER — LEVOTHYROXINE SODIUM 25 MCG PO TABS: 25.0000 ug | ORAL_TABLET | Freq: Every day | ORAL | Status: DC

## 2011-11-02 ENCOUNTER — Telehealth: Payer: Self-pay

## 2011-11-02 NOTE — Telephone Encounter (Signed)
Pt is past due for Coumadin follow-up. Called pt to schedule f/u appt.  Pt has fractured L ankle and is on crutches and limited mobility and weight bearing activities allowed.  Advised pt it is very important to monitor Coumadin, advised of risks associated with clot or bleed secondary to non-therapeutic INR and failure to monitor Coumadin appropriately. Pt states she is unable to drive will check with her daughter this afternoon to see when she can bring her into clinic and call back for appt.  Advised pt the sooner we can check it the better.  Pt aware of risks of non-compliance and the necessity to check INR as soon as possible.  Pt WCB to schedule appt.

## 2011-11-15 ENCOUNTER — Ambulatory Visit (INDEPENDENT_AMBULATORY_CARE_PROVIDER_SITE_OTHER): Payer: Medicare Other | Admitting: *Deleted

## 2011-11-15 DIAGNOSIS — I2699 Other pulmonary embolism without acute cor pulmonale: Secondary | ICD-10-CM

## 2011-11-15 DIAGNOSIS — I82409 Acute embolism and thrombosis of unspecified deep veins of unspecified lower extremity: Secondary | ICD-10-CM

## 2011-11-15 LAB — POCT INR: INR: 2.4

## 2011-12-07 ENCOUNTER — Other Ambulatory Visit: Payer: Self-pay | Admitting: Family Medicine

## 2011-12-07 DIAGNOSIS — R609 Edema, unspecified: Secondary | ICD-10-CM

## 2011-12-07 DIAGNOSIS — R52 Pain, unspecified: Secondary | ICD-10-CM

## 2011-12-08 ENCOUNTER — Ambulatory Visit
Admission: RE | Admit: 2011-12-08 | Discharge: 2011-12-08 | Disposition: A | Discharge status: Home / Self Care | Payer: Medicare Other | Source: Ambulatory Visit | Attending: Family Medicine | Admitting: Family Medicine

## 2011-12-08 DIAGNOSIS — R52 Pain, unspecified: Secondary | ICD-10-CM

## 2011-12-08 DIAGNOSIS — R609 Edema, unspecified: Secondary | ICD-10-CM

## 2011-12-27 ENCOUNTER — Ambulatory Visit (INDEPENDENT_AMBULATORY_CARE_PROVIDER_SITE_OTHER): Payer: Medicare Other

## 2011-12-27 DIAGNOSIS — I2699 Other pulmonary embolism without acute cor pulmonale: Secondary | ICD-10-CM

## 2011-12-27 DIAGNOSIS — I82409 Acute embolism and thrombosis of unspecified deep veins of unspecified lower extremity: Secondary | ICD-10-CM

## 2011-12-27 LAB — POCT INR: INR: 1.8

## 2012-02-01 ENCOUNTER — Ambulatory Visit (INDEPENDENT_AMBULATORY_CARE_PROVIDER_SITE_OTHER): Payer: Medicare Other | Admitting: *Deleted

## 2012-02-01 ENCOUNTER — Encounter: Payer: Self-pay | Admitting: Cardiovascular Disease

## 2012-02-01 ENCOUNTER — Ambulatory Visit (INDEPENDENT_AMBULATORY_CARE_PROVIDER_SITE_OTHER): Payer: Medicare Other | Admitting: Cardiovascular Disease

## 2012-02-01 VITALS — BP 168/90 | HR 64 | Ht 59.0 in | Wt 190.0 lb

## 2012-02-01 DIAGNOSIS — Z5181 Encounter for therapeutic drug level monitoring: Secondary | ICD-10-CM

## 2012-02-01 DIAGNOSIS — I82409 Acute embolism and thrombosis of unspecified deep veins of unspecified lower extremity: Secondary | ICD-10-CM

## 2012-02-01 DIAGNOSIS — I2699 Other pulmonary embolism without acute cor pulmonale: Secondary | ICD-10-CM

## 2012-02-01 DIAGNOSIS — I2782 Chronic pulmonary embolism: Secondary | ICD-10-CM

## 2012-02-01 DIAGNOSIS — I10 Essential (primary) hypertension: Secondary | ICD-10-CM

## 2012-02-01 LAB — POCT INR: INR: 2

## 2012-02-01 MED ORDER — NEBIVOLOL HCL 10 MG PO TABS: 10.0000 mg | ORAL_TABLET | Freq: Every day | ORAL | Status: DC

## 2012-02-01 NOTE — Progress Notes (Signed)
Virginia Summers Date of Birth  1941-02-28 [facility] Office  [address]    [address]   [address] [place], [place]  [address]    [place], [place]  [address] [phone]  Fax  [phone]  [phone]  Fax [phone]  Problem list: 1. Hypothyroidism 2. Deep vein thrombosis 3. Pulmonary embolus 4. Hypertension 5. Chest pain - normal cath 2008  History of Present Illness:  She has continued to have severe DOE.  This has been present since her PE.  She has noticed loss of indigestion-like pain for the past several months.  This happens typically at night when she is lying down.  She has been trying to lose weight but has not been able to lose weight consistently.  Nov. 20, 2013 - [place] has been about the same. She continues to have some shortness breath. She fell this past summer and broke her left ankle in 2 spots.  She also has some episodes of lightheadedness or dizziness.  Some aspects of these symptoms sound like vertigo.   These symptoms are not related to orthostasis.    Current Outpatient Prescriptions on File Prior to Visit  Medication Sig Dispense Refill  . acetaminophen (TYLENOL) 500 MG tablet Take 1,000 mg by mouth every 6 (six) hours as needed. For pain.      Marland Kitchen albuterol (PROVENTIL HFA) 108 (90 BASE) MCG/ACT inhaler Inhale 2 puffs into the lungs every 4 (four) hours as needed. For shortness of breath.      Marland Kitchen amLODipine (NORVASC) 5 MG tablet Take 1 tablet (5 mg total) by mouth daily.  90 tablet  3  . atorvastatin (LIPITOR) 40 MG tablet Take 1 tablet (40 mg total) by mouth daily.  30 tablet  11  . carvedilol (COREG) 25 MG tablet Take 1 tablet (25 mg total) by mouth 2 (two) times daily.  60 tablet  5  . fish oil-omega-3 fatty acids 1000 MG capsule Take 1 g by mouth daily.        . furosemide (LASIX) 40 MG tablet Take 40 mg by mouth daily.        Marland Kitchen levothyroxine (SYNTHROID, LEVOTHROID) 25 MCG tablet Take 1 tablet (25 mcg total) by mouth  daily.  30 tablet  9  . lisinopril (PRINIVIL,ZESTRIL) 40 MG tablet Take 40 mg by mouth daily.        Marland Kitchen loratadine (CLARITIN) 10 MG tablet Take 10 mg by mouth daily as needed. For allergies.      Marland Kitchen omeprazole (PRILOSEC) 20 MG capsule Take 20 mg by mouth daily.        . potassium chloride (MICRO-K) 10 MEQ CR capsule Take 1 capsule (10 mEq total) by mouth daily.  30 capsule  11  . traMADol (ULTRAM) 50 MG tablet Take 50 mg by mouth every 6 (six) hours as needed. For pain.      Marland Kitchen warfarin (COUMADIN) 5 MG tablet Take as directed by anticoagulation clinic  40 tablet  3    Allergies  Allergen Reactions  . Penicillins Swelling and Rash  . Sulfa Drugs Cross Reactors Swelling and Rash    Past Medical History  Diagnosis Date  . DVT (deep venous thrombosis)     BILATERAL, March 2012  . Pulmonary embolism   . HTN (hypertension)   . Diabetes mellitus   . Chest pain   . Hypothyroidism   . GERD (gastroesophageal reflux disease)   . Diverticulitis   .  Anticoagulant long-term use     Past Surgical History  Procedure Date  . Back surgery 2000    DISC  . Partial hysterectomy AGE 71's APPROX.  Marland Kitchen Appendectomy AGE 50  . Shoulder surgery 5-6 YRS OLD    PINS  . Secondary intraocular lense implantation [id number]    OU    History  Smoking status  . Former Smoker -- 0.5 packs/day for 30 years  . Types: Cigarettes  . Quit date: 03/15/2003  Smokeless tobacco  . Never Used    History  Alcohol Use No    Family History  Problem Relation Age of Onset  . Transient ischemic attack Father   . Coronary artery disease Father   . Stroke Mother   . Diabetes Mother   . Diabetes Sister   . Hypertension Daughter     Reviw of Systems:  Reviewed in the HPI.  All other systems are negative.  Physical Exam: Blood pressure 168/90, pulse 64, height 4\' 11"  (1.499 m), weight 190 lb (86.183 kg). Body mass index is 38.38 kg/(m^2).  General: Well developed, well nourished, in no acute distress.  Head:  Normocephalic, atraumatic, sclera non-icteric, mucus membranes are moist,   Neck: Supple. Carotids are 2 + without bruits. No JVD  Lungs: Clear bilaterally to auscultation.    Heart: regular rate  With normal  S1 S2. No murmurs, gallops or rubs.  Abdomen: Soft, non-tender, non-distended with normal bowel sounds. No hepatomegaly. No rebound/guarding. No masses.  Msk:  Strength and tone are normal  Extremities: No clubbing or cyanosis. No edema.  Distal pedal pulses are 2+ and equal bilaterally.  Neuro: Alert and oriented X 3. Moves all extremities spontaneously.  Psych:  Responds to questions appropriately with a normal affect.  ECG: 02/06/2012 --Normal sinus rhythm at 64 beats a minute. She is a (spot. The EKG is unchanged from previous tracings.  Assessment / Plan:

## 2012-02-01 NOTE — Patient Instructions (Signed)
Your physician recommends that you schedule a follow-up appointment in: 3 MONTHS    Your physician has recommended you make the following change in your medication:   STOP COREG START BYSTOLIC 10 MG TO SEE IF THIS WILL HELP YOUR BLOOD PRESSURE,  CONTINUE TO CHECK BP AND CALL WITH QUESTIONS OR CONCERNS.

## 2012-02-01 NOTE — Assessment & Plan Note (Signed)
Virginia Summers presents with persistent elevations in her blood pressure. She also has had some episodes of dizziness or lightheadedness. It's difficult to know whether her episodes of lightheadedness are due to orthostatic hypotension but most of the episodes sound more like vertigo.  She also complains of having dry skin which I suspect may be due to her Lasix.  She has an allergy to sulfonamides and so we have avoided using  HCTZ.  We will stop the carvedilol and start her on Bystolic 10 mg a day. This should  help bring down her blood pressure a bit more. I've asked her to try to work on a good weight loss program. She admits to eating a lot of breads and sweets.  I will see her again in 3 months

## 2012-02-15 ENCOUNTER — Other Ambulatory Visit: Payer: Self-pay | Admitting: *Deleted

## 2012-02-15 MED ORDER — ATORVASTATIN CALCIUM 40 MG PO TABS: 40.0000 mg | ORAL_TABLET | Freq: Every day | ORAL | Status: DC

## 2012-02-15 NOTE — Telephone Encounter (Signed)
Fax Received. Refill Completed. Virginia Summers (R.M.A)   

## 2012-02-16 ENCOUNTER — Other Ambulatory Visit: Payer: Self-pay | Admitting: *Deleted

## 2012-02-16 NOTE — Telephone Encounter (Signed)
Received refill request for pt Coreg. In previous notes Doctor states to d/c med. Darel Hong from pharmacy is aware 4:57p.

## 2012-02-16 NOTE — Telephone Encounter (Signed)
Opened in Error.

## 2012-02-29 ENCOUNTER — Ambulatory Visit (INDEPENDENT_AMBULATORY_CARE_PROVIDER_SITE_OTHER): Payer: Medicare Other | Admitting: *Deleted

## 2012-02-29 DIAGNOSIS — I82409 Acute embolism and thrombosis of unspecified deep veins of unspecified lower extremity: Secondary | ICD-10-CM

## 2012-02-29 DIAGNOSIS — I2699 Other pulmonary embolism without acute cor pulmonale: Secondary | ICD-10-CM

## 2012-03-08 ENCOUNTER — Ambulatory Visit (INDEPENDENT_AMBULATORY_CARE_PROVIDER_SITE_OTHER): Payer: Medicare Other | Admitting: Pharmacist

## 2012-03-08 ENCOUNTER — Encounter: Payer: Self-pay | Admitting: Cardiovascular Disease

## 2012-03-08 DIAGNOSIS — I2699 Other pulmonary embolism without acute cor pulmonale: Secondary | ICD-10-CM

## 2012-03-08 DIAGNOSIS — I82409 Acute embolism and thrombosis of unspecified deep veins of unspecified lower extremity: Secondary | ICD-10-CM

## 2012-03-08 LAB — POCT INR: INR: 2.3

## 2012-03-09 ENCOUNTER — Other Ambulatory Visit: Payer: Self-pay | Admitting: *Deleted

## 2012-03-09 MED ORDER — WARFARIN SODIUM 5 MG PO TABS: ORAL_TABLET | ORAL | Status: DC

## 2012-03-22 ENCOUNTER — Other Ambulatory Visit: Payer: Self-pay | Admitting: *Deleted

## 2012-03-22 ENCOUNTER — Ambulatory Visit (INDEPENDENT_AMBULATORY_CARE_PROVIDER_SITE_OTHER): Payer: Medicare Other | Admitting: *Deleted

## 2012-03-22 DIAGNOSIS — I2699 Other pulmonary embolism without acute cor pulmonale: Secondary | ICD-10-CM

## 2012-03-22 DIAGNOSIS — I82409 Acute embolism and thrombosis of unspecified deep veins of unspecified lower extremity: Secondary | ICD-10-CM

## 2012-03-22 LAB — POCT INR: INR: 3

## 2012-03-22 NOTE — Telephone Encounter (Signed)
Pharmacy requested patients Coreg 03-21-12. Called pharmacy and spoke to [place] that patient was told to stop her Coreg according to notes on file. She states she will inactivate it to make sure their computers don't request it again. 5:25p.

## 2012-03-26 ENCOUNTER — Other Ambulatory Visit: Payer: Self-pay | Admitting: *Deleted

## 2012-03-26 NOTE — Telephone Encounter (Signed)
Opened in Error.

## 2012-03-27 ENCOUNTER — Other Ambulatory Visit: Payer: Self-pay | Admitting: *Deleted

## 2012-03-27 MED ORDER — AMLODIPINE BESYLATE 5 MG PO TABS: 5.0000 mg | ORAL_TABLET | Freq: Every day | ORAL | Status: DC

## 2012-03-27 NOTE — Telephone Encounter (Signed)
Fax Received. Refill Completed. Georgianna Band Chowoe (R.M.A)   

## 2012-04-03 ENCOUNTER — Other Ambulatory Visit: Payer: Self-pay | Admitting: *Deleted

## 2012-04-03 NOTE — Telephone Encounter (Signed)
Opened in Error.

## 2012-04-04 ENCOUNTER — Other Ambulatory Visit: Payer: Self-pay | Admitting: *Deleted

## 2012-04-04 NOTE — Telephone Encounter (Signed)
PHARMACY SENT IN A REQUEST FOR PATIENTS COREG. CALLED PHARMACY AND SPOKE TO KERI INFORMING HER THAT PT WAS TOOK OF HER COREG BY THE DOCTOR. KERI AGREED SHE WOULD PUT THAT NOTE IN PATIENT CHART.

## 2012-04-06 ENCOUNTER — Other Ambulatory Visit: Payer: Self-pay | Admitting: *Deleted

## 2012-04-06 NOTE — Telephone Encounter (Signed)
Opened in Error.

## 2012-04-10 ENCOUNTER — Other Ambulatory Visit: Payer: Self-pay | Admitting: *Deleted

## 2012-04-10 ENCOUNTER — Telehealth: Payer: Self-pay | Admitting: Cardiovascular Disease

## 2012-04-10 MED ORDER — WARFARIN SODIUM 5 MG PO TABS: ORAL_TABLET | ORAL | Status: DC

## 2012-04-10 NOTE — Telephone Encounter (Signed)
[phone] home monitor for coumadin checks with  alear , would like verbal order

## 2012-04-10 NOTE — Telephone Encounter (Signed)
PT DECLINED TO SWITCH AT THIS TIME, PT WILL CONTINUE TO BE MONITORED HER IN OUR COUMADIN CLINIC.

## 2012-04-11 ENCOUNTER — Other Ambulatory Visit: Payer: Self-pay | Admitting: *Deleted

## 2012-04-11 NOTE — Telephone Encounter (Signed)
Opened in Error.

## 2012-04-19 ENCOUNTER — Ambulatory Visit (INDEPENDENT_AMBULATORY_CARE_PROVIDER_SITE_OTHER): Payer: Medicare Other | Admitting: *Deleted

## 2012-04-19 DIAGNOSIS — I2699 Other pulmonary embolism without acute cor pulmonale: Secondary | ICD-10-CM

## 2012-04-19 DIAGNOSIS — I82409 Acute embolism and thrombosis of unspecified deep veins of unspecified lower extremity: Secondary | ICD-10-CM

## 2012-04-30 ENCOUNTER — Ambulatory Visit: Payer: Medicare Other | Admitting: Cardiovascular Disease

## 2012-06-01 ENCOUNTER — Other Ambulatory Visit: Payer: Self-pay | Admitting: *Deleted

## 2012-06-12 ENCOUNTER — Ambulatory Visit: Payer: Medicare Other | Admitting: Cardiovascular Disease

## 2012-06-25 IMAGING — CT CT ANGIO CHEST
1 of 6 series · 17 of 32 positions shown · IV contrast ([ID] [id number])
Comparison: None.

***ADDENDUM*** CREATED: 05/27/2010 [DATE]

Please note that blood work was performed at our outpatient
facility for BUN and creatinine measurements.
***END ADDENDUM*** SIGNED BY: [place] Jake, M.D.
CLINICAL DATA: Short of breath
CT ANGIOGRAPHY CHEST WITH CONTRAST
TECHNIQUE: Multidetector CT imaging of the chest was performed
using the standard protocol during bolus administration of
intravenous contrast.  Multiplanar CT image reconstructions
including MIPs were obtained to evaluate the vascular anatomy.
Contrast:  125 ml [id number]-VUU

[Series 6: pe thin 1.25 · axial · 0.55mm/px · z∈[-262,-31]mm · 17 of 209 slices shown]
[im 12/209  lung]
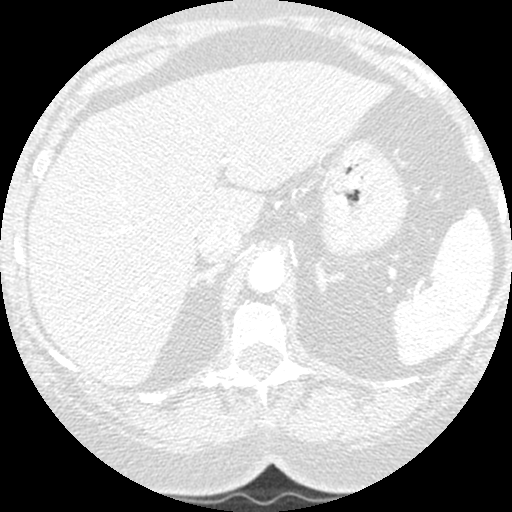
[im 24/209  mediastinal]
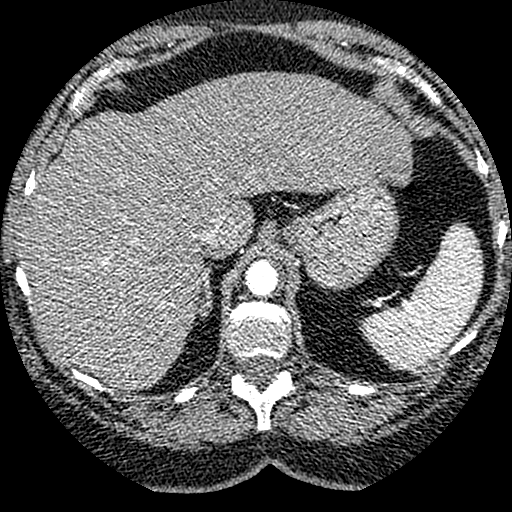
[im 35/209  lung]
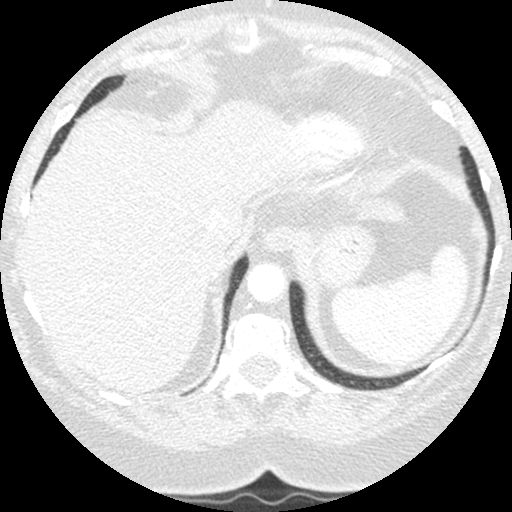
[im 47/209  mediastinal]
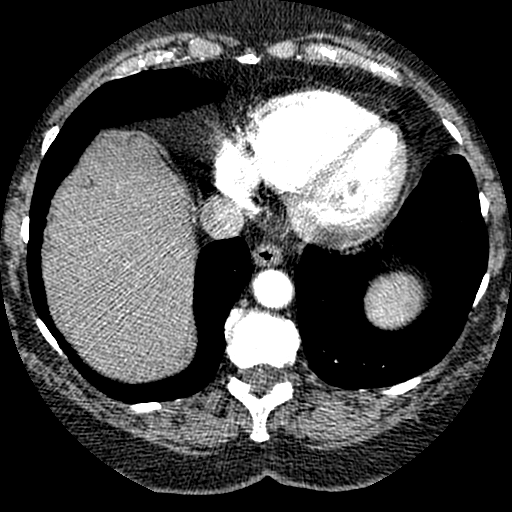
[im 58/209  lung]
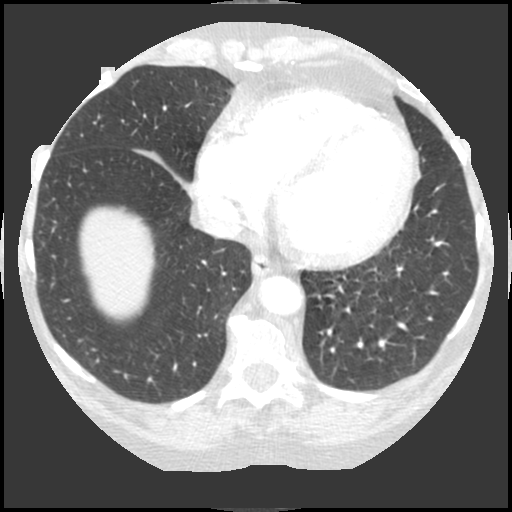
[im 70/209  mediastinal]
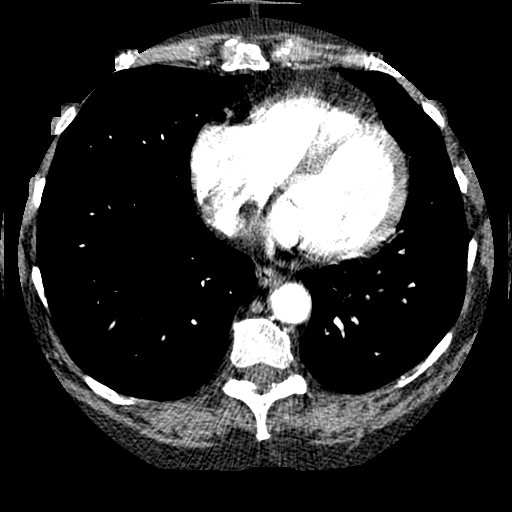
[im 81/209  lung]
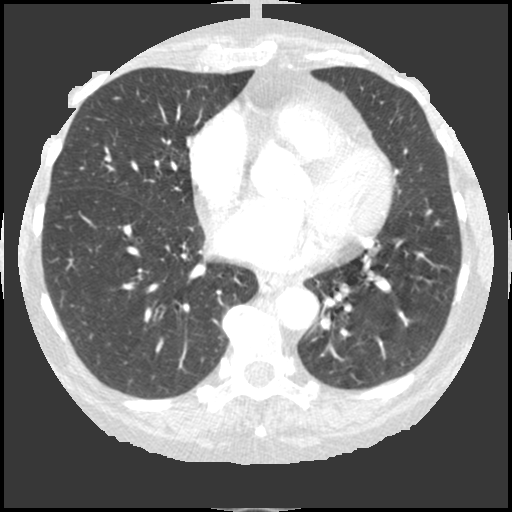
[im 93/209  mediastinal]
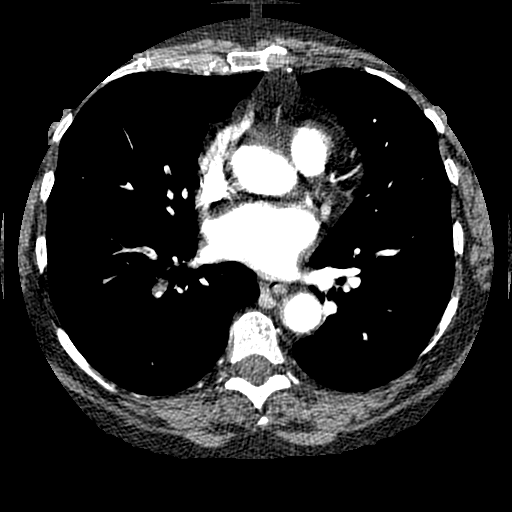
[im 105/209  lung]
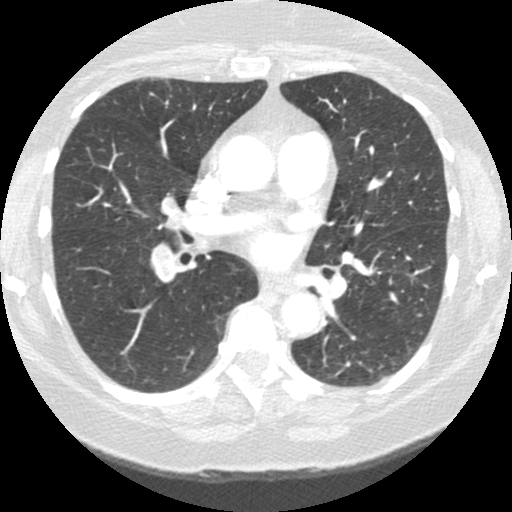
[im 116/209  mediastinal]
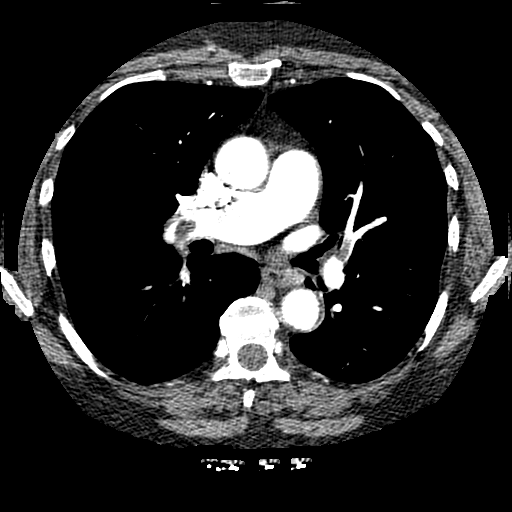
[im 128/209  lung]
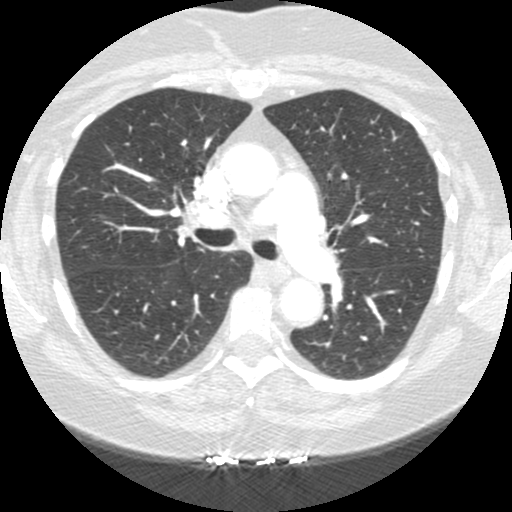
[im 139/209  mediastinal]
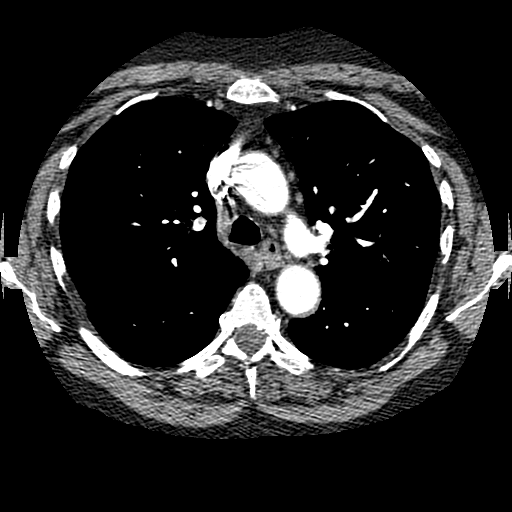
[im 151/209  lung]
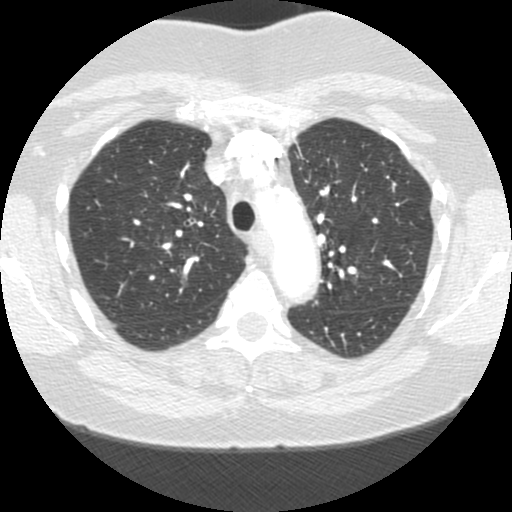
[im 162/209  mediastinal]
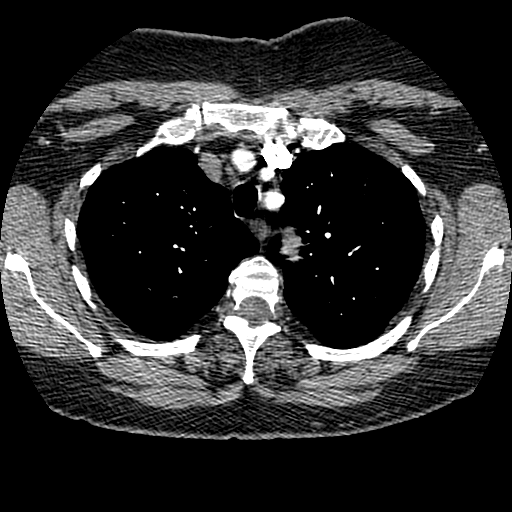
[im 174/209  lung]
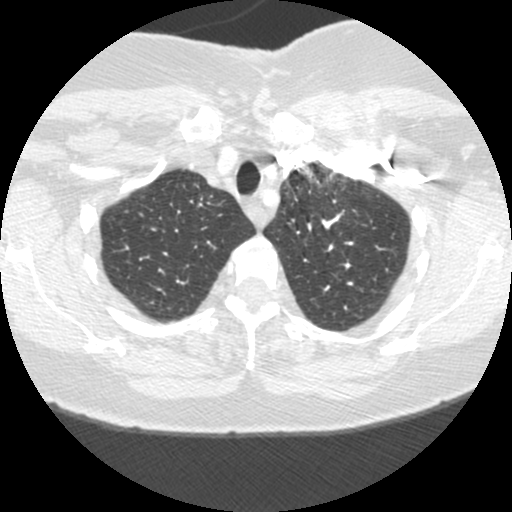
[im 185/209  mediastinal]
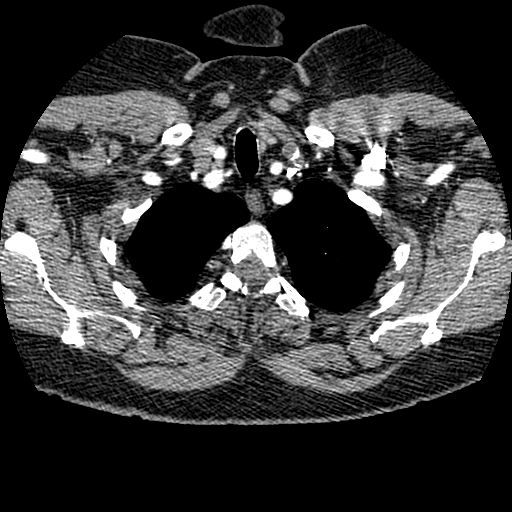
[im 197/209  lung]
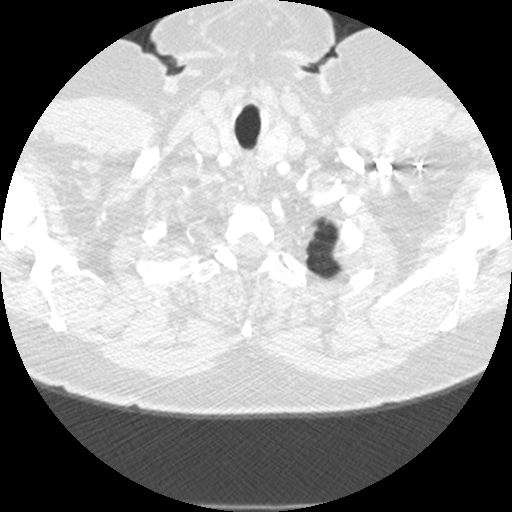

[17 of 32 positions shown; findings below may reference images not displayed]

FINDINGS: The study is positive for extensive bilateral acute
pulmonary thromboembolism.  Acute thrombus is present in the right
upper lobe lobe are pulmonary artery branch, right middle lobe
branch, and right lower lobe branch.  Thrombus is also present in
the segmental branch to the lingula within the left lung.  Negative
abnormal mediastinal adenopathy.

Clear lungs.

No pneumothorax no pleural effusion.

No acute bony deformity.

Review of the MIP images confirms the above findings.
IMPRESSION: Extensive bilateral acute pulmonary thromboembolism.

Critical test results telephoned to Uss, RN, Dr. Noy nurse at
the time of interpretation on 05/26/2010 at 8816 hours.

## 2012-06-29 ENCOUNTER — Encounter: Payer: Self-pay | Admitting: Cardiovascular Disease

## 2012-06-29 IMAGING — CR DG CHEST 2V
2 series · 2 of 2 positions shown · non-contrast
Comparison: 11/01/2007

CLINICAL DATA: Acute pulmonary embolism.

CHEST - 2 VIEW

[w chest pa]
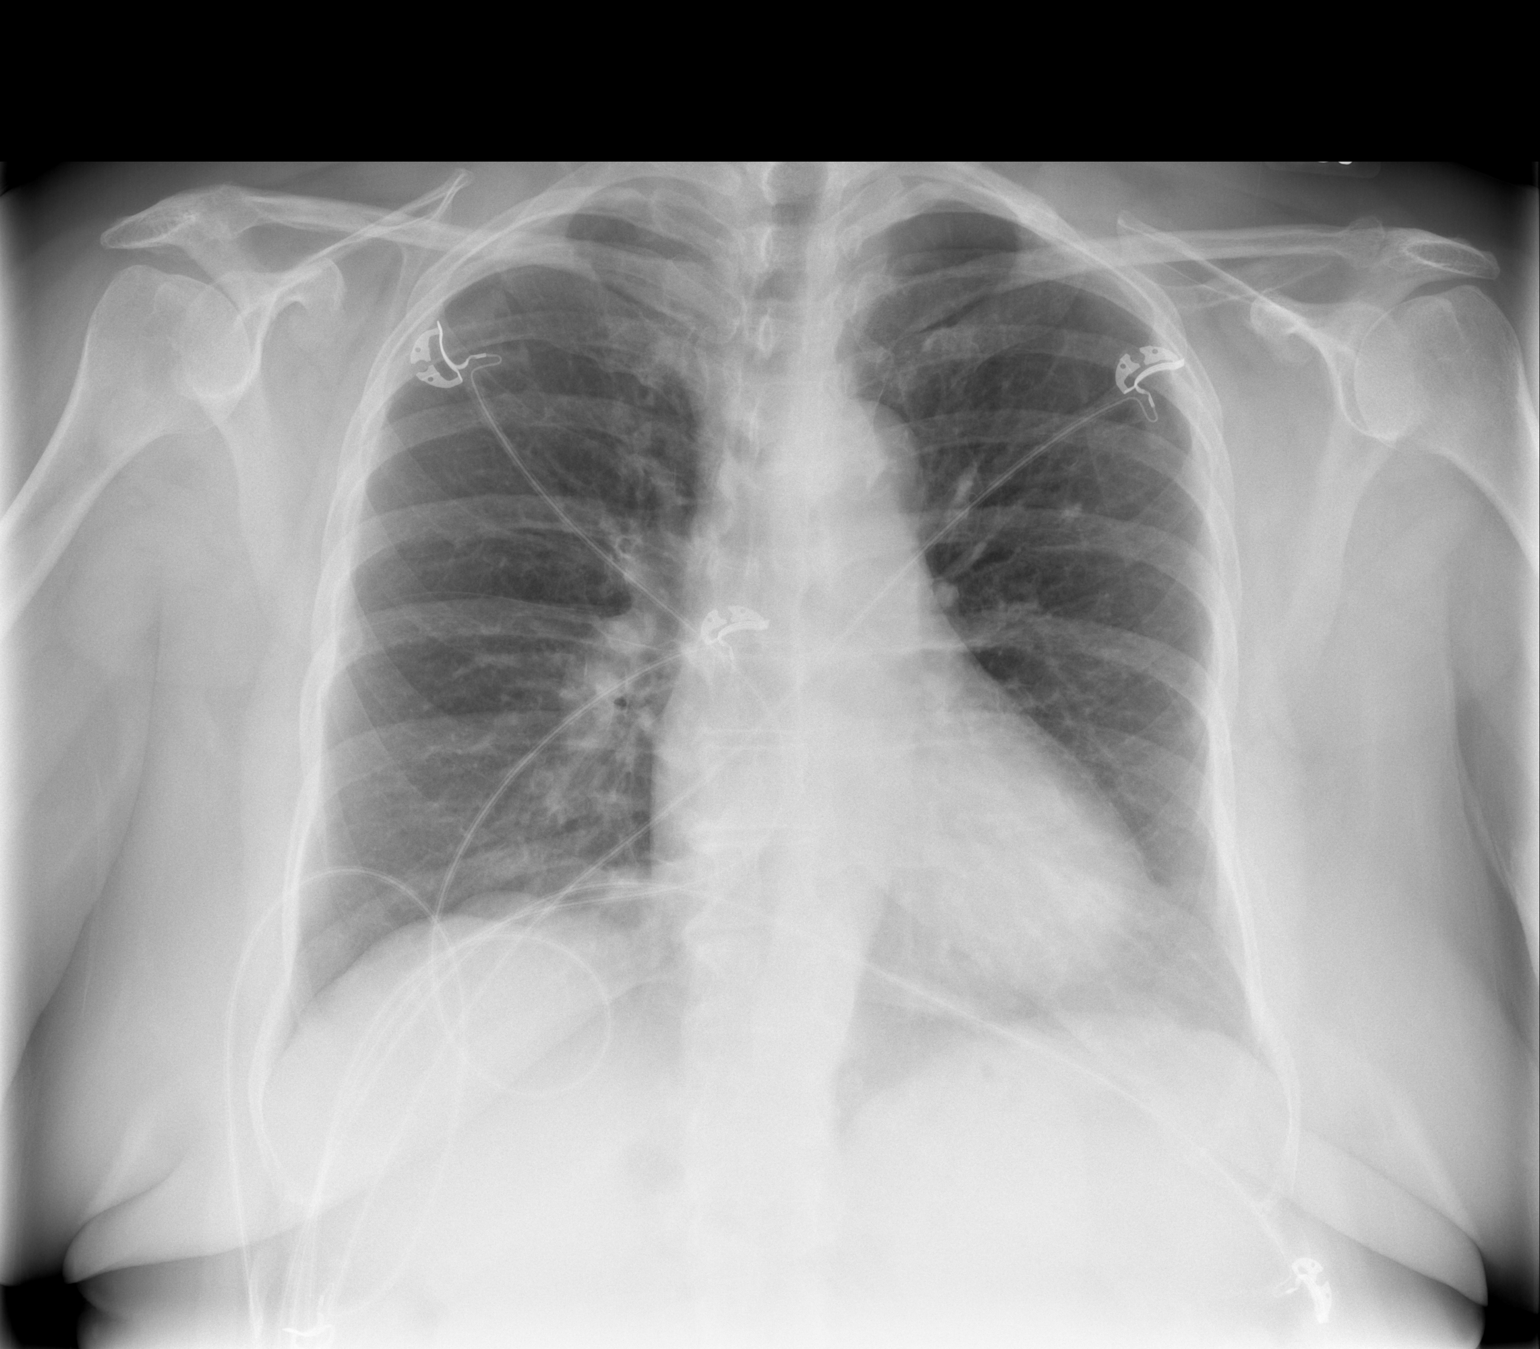

[w chest lat]
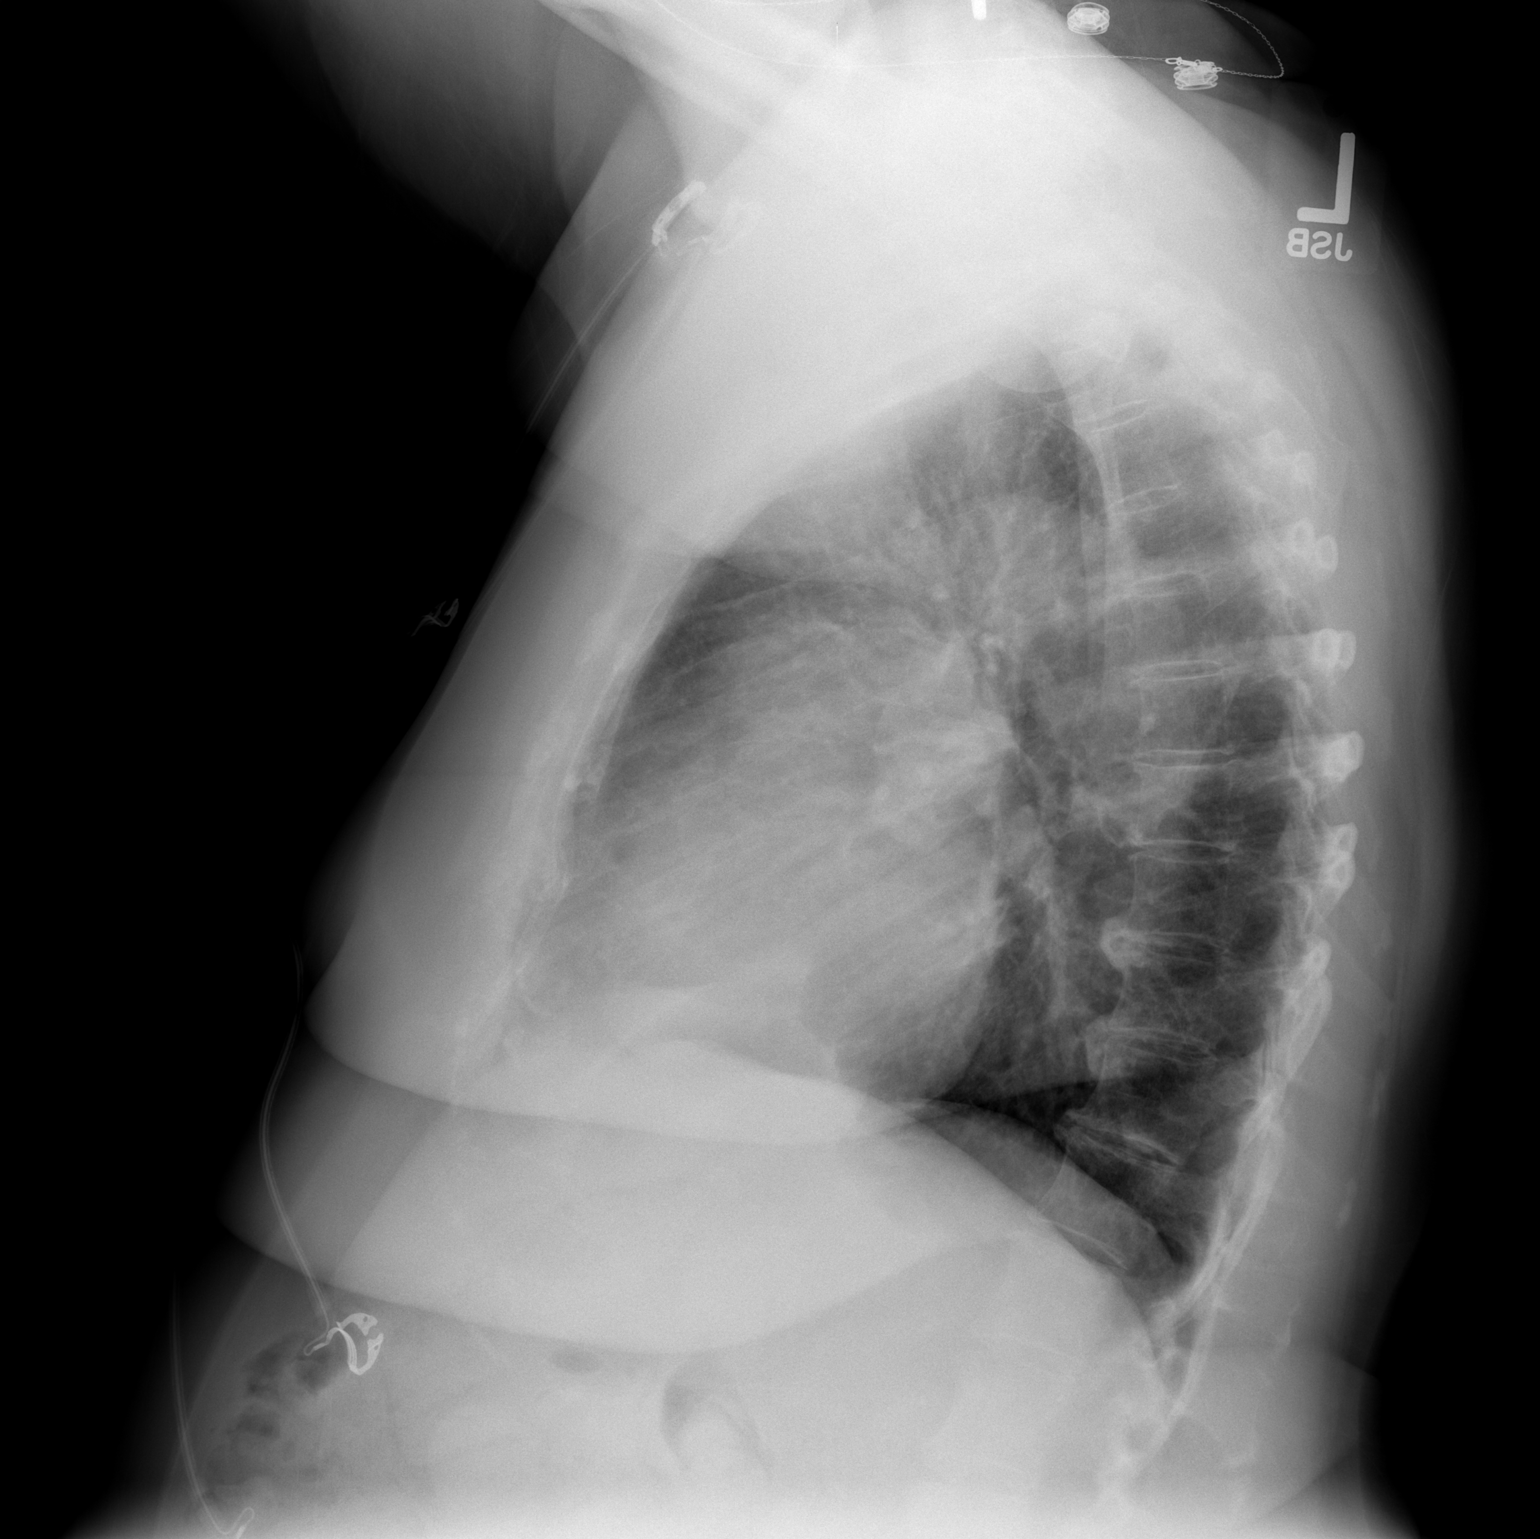

[2 of 2 positions shown; findings below may reference images not displayed]

FINDINGS: Heart size is within normal limits.  Both lungs are
clear.  No evidence of pleural effusion.  No mass or adenopathy
identified.
IMPRESSION: Stable exam.  No acute findings.

## 2012-07-17 ENCOUNTER — Telehealth: Payer: Self-pay | Admitting: Cardiovascular Disease

## 2012-07-17 NOTE — Telephone Encounter (Signed)
App made for ov and coumadin app.

## 2012-07-17 NOTE — Telephone Encounter (Signed)
Number busy

## 2012-07-17 NOTE — Telephone Encounter (Signed)
Number busy x 2

## 2012-07-17 NOTE — Telephone Encounter (Signed)
New problem     Recent loss her husband on 3/30 . Having a hard time coping with it .

## 2012-07-17 NOTE — Telephone Encounter (Signed)
Number is a fast busy tone.

## 2012-07-17 NOTE — Telephone Encounter (Signed)
Returned call to number provided, this number has been connected.

## 2012-07-19 ENCOUNTER — Ambulatory Visit (INDEPENDENT_AMBULATORY_CARE_PROVIDER_SITE_OTHER): Payer: Medicare Other

## 2012-07-19 DIAGNOSIS — I82409 Acute embolism and thrombosis of unspecified deep veins of unspecified lower extremity: Secondary | ICD-10-CM

## 2012-07-19 DIAGNOSIS — I2699 Other pulmonary embolism without acute cor pulmonale: Secondary | ICD-10-CM

## 2012-07-20 ENCOUNTER — Other Ambulatory Visit: Payer: Self-pay

## 2012-07-20 MED ORDER — WARFARIN SODIUM 5 MG PO TABS: ORAL_TABLET | ORAL | Status: DC

## 2012-08-07 ENCOUNTER — Other Ambulatory Visit: Payer: Self-pay | Admitting: *Deleted

## 2012-08-07 MED ORDER — POTASSIUM CHLORIDE ER 10 MEQ PO CPCR: 10.0000 meq | ORAL_CAPSULE | Freq: Every day | ORAL | Status: DC

## 2012-08-07 MED ORDER — LEVOTHYROXINE SODIUM 25 MCG PO TABS: 25.0000 ug | ORAL_TABLET | Freq: Every day | ORAL | Status: DC

## 2012-08-07 NOTE — Telephone Encounter (Signed)
Fax Received. Refill Completed. Fahmida Jurich Chowoe (R.M.A)   

## 2012-08-07 NOTE — Telephone Encounter (Signed)
Fax Received. Refill Completed. Rosalind Guido Chowoe (R.M.A)   

## 2012-08-20 ENCOUNTER — Ambulatory Visit (INDEPENDENT_AMBULATORY_CARE_PROVIDER_SITE_OTHER): Payer: Medicare Other | Admitting: *Deleted

## 2012-08-20 ENCOUNTER — Telehealth: Payer: Self-pay | Admitting: *Deleted

## 2012-08-20 DIAGNOSIS — I2699 Other pulmonary embolism without acute cor pulmonale: Secondary | ICD-10-CM

## 2012-08-20 DIAGNOSIS — I82409 Acute embolism and thrombosis of unspecified deep veins of unspecified lower extremity: Secondary | ICD-10-CM

## 2012-08-20 LAB — POCT INR: INR: 1.9

## 2012-08-20 NOTE — Telephone Encounter (Signed)
Pt called stating that the 2 new  medications  she is on is Trazodone  50 mg  Tabs 2 at  [facility] and  Citalopram 20 mg  daily and pt instructed that the Citalopram will increase bruising and instructed for her to be careful and she states understanding.

## 2012-09-12 ENCOUNTER — Ambulatory Visit (INDEPENDENT_AMBULATORY_CARE_PROVIDER_SITE_OTHER): Payer: Medicare Other | Admitting: Cardiovascular Disease

## 2012-09-12 ENCOUNTER — Encounter: Payer: Self-pay | Admitting: Cardiovascular Disease

## 2012-09-12 ENCOUNTER — Ambulatory Visit (INDEPENDENT_AMBULATORY_CARE_PROVIDER_SITE_OTHER): Payer: Medicare Other | Admitting: *Deleted

## 2012-09-12 VITALS — BP 158/80 | HR 94 | Ht 59.0 in | Wt 188.0 lb

## 2012-09-12 DIAGNOSIS — R252 Cramp and spasm: Secondary | ICD-10-CM

## 2012-09-12 DIAGNOSIS — F411 Generalized anxiety disorder: Secondary | ICD-10-CM

## 2012-09-12 DIAGNOSIS — I2699 Other pulmonary embolism without acute cor pulmonale: Secondary | ICD-10-CM

## 2012-09-12 DIAGNOSIS — I10 Essential (primary) hypertension: Secondary | ICD-10-CM

## 2012-09-12 DIAGNOSIS — I82409 Acute embolism and thrombosis of unspecified deep veins of unspecified lower extremity: Secondary | ICD-10-CM

## 2012-09-12 DIAGNOSIS — E039 Hypothyroidism, unspecified: Secondary | ICD-10-CM

## 2012-09-12 DIAGNOSIS — F419 Anxiety disorder, unspecified: Secondary | ICD-10-CM

## 2012-09-12 DIAGNOSIS — E785 Hyperlipidemia, unspecified: Secondary | ICD-10-CM

## 2012-09-12 LAB — LIPID PANEL
Cholesterol: 218 mg/dL — ABNORMAL HIGH (ref 0–200)
Total CHOL/HDL Ratio: 6
Triglycerides: 330 mg/dL — ABNORMAL HIGH (ref 0.0–149.0)
VLDL: 66 mg/dL — ABNORMAL HIGH (ref 0.0–40.0)

## 2012-09-12 LAB — HEPATIC FUNCTION PANEL
Albumin: 3.9 g/dL (ref 3.5–5.2)
Total Bilirubin: 0.3 mg/dL (ref 0.3–1.2)

## 2012-09-12 LAB — POCT INR: INR: 2

## 2012-09-12 LAB — BASIC METABOLIC PANEL
BUN: 14 mg/dL (ref 6–23)
CO2: 26 mEq/L (ref 19–32)
Chloride: 104 mEq/L (ref 96–112)
Creatinine, Ser: 0.9 mg/dL (ref 0.4–1.2)
Potassium: 4.7 mEq/L (ref 3.5–5.1)

## 2012-09-12 LAB — TSH: TSH: 0.42 u[IU]/mL (ref 0.35–5.50)

## 2012-09-12 MED ORDER — CARVEDILOL 6.25 MG PO TABS: 6.2500 mg | ORAL_TABLET | Freq: Two times a day (BID) | ORAL | Status: DC

## 2012-09-12 NOTE — Progress Notes (Signed)
Virginia Summers Date of Birth  1940/05/21 [facility] Office  [address]    [address]   [address] [place], [place]  [address]    [place], [place]  [address] [phone]  Fax  [phone]  [phone]  Fax [phone]  Problem list: 1. Hypothyroidism 2. Deep vein thrombosis 3. Pulmonary embolus 4. Hypertension 5. Chest pain - normal cath 2008  History of Present Illness:  She has continued to have severe DOE.  This has been present since her PE.  She has noticed loss of indigestion-like pain for the past several months.  This happens typically at night when she is lying down.  She has been trying to lose weight but has not been able to lose weight consistently.  Nov. 20, 2013 - [place] has been about the same. She continues to have some shortness breath. She fell this past summer and broke her left ankle in 2 spots.  She also has some episodes of lightheadedness or dizziness.  Some aspects of these symptoms sound like vertigo.   These symptoms are not related to orthostasis.    September 12, 2012:  Raysha is very emotional today - her husband passed away several months ago.  He was in the hospital for lung problems and apparently developed c-diff colitis.    He has continued to have worsening dyspnea.   She has also had some unsteadyness when walking  Current Outpatient Prescriptions on File Prior to Visit  Medication Sig Dispense Refill  . acetaminophen (TYLENOL) 500 MG tablet Take 1,000 mg by mouth every 6 (six) hours as needed. For pain.      Marland Kitchen albuterol (PROVENTIL HFA) 108 (90 BASE) MCG/ACT inhaler Inhale 2 puffs into the lungs every 4 (four) hours as needed. For shortness of breath.      Marland Kitchen amLODipine (NORVASC) 5 MG tablet Take 1 tablet (5 mg total) by mouth daily.  90 tablet  3  . fish oil-omega-3 fatty acids 1000 MG capsule Take 1 g by mouth daily.        Marland Kitchen levothyroxine (SYNTHROID, LEVOTHROID) 25 MCG tablet Take 1 tablet (25 mcg total) by mouth  daily.  30 tablet  6  . loratadine (CLARITIN) 10 MG tablet Take 10 mg by mouth daily as needed. For allergies.      Marland Kitchen nebivolol (BYSTOLIC) 10 MG tablet Take 1 tablet (10 mg total) by mouth daily.  30 tablet  5  . omeprazole (PRILOSEC) 20 MG capsule Take 20 mg by mouth daily.        . potassium chloride (MICRO-K) 10 MEQ CR capsule Take 1 capsule (10 mEq total) by mouth daily.  30 capsule  5  . traMADol (ULTRAM) 50 MG tablet Take 50 mg by mouth every 6 (six) hours as needed. For pain.      Marland Kitchen warfarin (COUMADIN) 5 MG tablet Take as directed by anticoagulation clinic  40 tablet  1   No current facility-administered medications on file prior to visit.    Allergies  Allergen Reactions  . Penicillins Swelling and Rash  . Sulfa Drugs Cross Reactors Swelling and Rash    Past Medical History  Diagnosis Date  . DVT (deep venous thrombosis)     BILATERAL, March 2012  . Pulmonary embolism   . HTN (hypertension)   . Diabetes mellitus   . Chest pain   . Hypothyroidism   . GERD (gastroesophageal reflux disease)   . Diverticulitis   .  Anticoagulant long-term use     Past Surgical History  Procedure Laterality Date  . Back surgery  2000    DISC  . Partial hysterectomy  AGE 59's APPROX.  Marland Kitchen Appendectomy  AGE 46  . Shoulder surgery  5-6 YRS OLD    PINS  . Secondary intraocular lense implantation  [id number]    OU    History  Smoking status  . Former Smoker -- 0.50 packs/day for 30 years  . Types: Cigarettes  . Quit date: 03/15/2003  Smokeless tobacco  . Never Used    History  Alcohol Use No    Family History  Problem Relation Age of Onset  . Transient ischemic attack Father   . Coronary artery disease Father   . Stroke Mother   . Diabetes Mother   . Diabetes Sister   . Hypertension Daughter     Reviw of Systems:  Reviewed in the HPI.  All other systems are negative.  Physical Exam: Blood pressure 158/80, pulse 94, height 4\' 11"  (1.499 m), weight 188 lb (85.276 kg),  SpO2 95.00%. Body mass index is 37.95 kg/(m^2).  General: Well developed, well nourished, in no acute distress.  Head: Normocephalic, atraumatic, sclera non-icteric, mucus membranes are moist,   Neck: Supple. Carotids are 2 + without bruits. No JVD  Lungs: Clear bilaterally to auscultation.    Heart: regular rate  With normal  S1 S2. No murmurs, gallops or rubs.  Abdomen: Soft, non-tender, non-distended with normal bowel sounds. No hepatomegaly. No rebound/guarding. No masses.  Msk:  Strength and tone are normal  Extremities: No clubbing or cyanosis. No edema.  Distal pedal pulses are 2+ and equal bilaterally.  Neuro: Alert and oriented X 3. Moves all extremities spontaneously.  Psych:  Responds to questions appropriately with a normal affect.  ECG:  Assessment / Plan:

## 2012-09-12 NOTE — Patient Instructions (Addendum)
Your physician has recommended you make the following change in your medication:  Stop bystolic due to cost Start carvedilol/ coreg 6.25 mg twice daily 12 hours apart.  Elevate legs 4-5 times a day for 15 minutes.   Your physician wants you to follow-up in: 6 months  You will receive a reminder letter in the mail two months in advance. If you don't receive a letter, please call our office to schedule the follow-up appointment.

## 2012-09-12 NOTE — Assessment & Plan Note (Signed)
Her blood pressure is okay.

## 2012-09-12 NOTE — Assessment & Plan Note (Signed)
She needs to get another primary medical doctor to follow her TSH levels.Virginia Summers measure a TSH today since she is on Synthroid.

## 2012-09-12 NOTE — Assessment & Plan Note (Signed)
She has chronic shortness breath since being diagnosed with a pulmonary embolus. She is been on Coumadin. I've recommended that she work on a good diet, exercise, and weight loss program. Should help some of her dyspnea. Her oxygen saturation levels are okay

## 2012-09-18 ENCOUNTER — Telehealth: Payer: Self-pay | Admitting: Cardiovascular Disease

## 2012-09-18 NOTE — Telephone Encounter (Signed)
Called patient with lab results and MD recommendations.

## 2012-09-18 NOTE — Telephone Encounter (Signed)
Follow Up ° ° ° ° °Pt calling in following up on test results. Please call. °

## 2012-09-19 ENCOUNTER — Telehealth: Payer: Self-pay | Admitting: Cardiovascular Disease

## 2012-09-19 NOTE — Telephone Encounter (Signed)
New Problem:    Patient called in because she broke her diabetic meter and would like to know if Dr. Elease Hashimoto would give the ok for an order for a new one.  When patient asked Dr. Jeannetta Nap, Dr. Charline Bills that he saw no medical reason for her to continue taking her sugar levels.  Please call back.

## 2012-09-19 NOTE — Telephone Encounter (Signed)
I called Dr Jeannetta Nap, he said to redirect pt to call there and he will write order, pt was called and made aware.

## 2012-10-03 ENCOUNTER — Ambulatory Visit (INDEPENDENT_AMBULATORY_CARE_PROVIDER_SITE_OTHER): Payer: Medicare Other | Admitting: *Deleted

## 2012-10-03 DIAGNOSIS — I82409 Acute embolism and thrombosis of unspecified deep veins of unspecified lower extremity: Secondary | ICD-10-CM

## 2012-10-03 DIAGNOSIS — I2699 Other pulmonary embolism without acute cor pulmonale: Secondary | ICD-10-CM

## 2012-10-03 LAB — POCT INR: INR: 4

## 2012-10-17 ENCOUNTER — Ambulatory Visit (INDEPENDENT_AMBULATORY_CARE_PROVIDER_SITE_OTHER): Payer: Medicare Other | Admitting: *Deleted

## 2012-10-17 DIAGNOSIS — I2699 Other pulmonary embolism without acute cor pulmonale: Secondary | ICD-10-CM

## 2012-10-17 DIAGNOSIS — I82409 Acute embolism and thrombosis of unspecified deep veins of unspecified lower extremity: Secondary | ICD-10-CM

## 2012-10-17 LAB — POCT INR: INR: 2.5

## 2012-10-25 ENCOUNTER — Telehealth: Payer: Self-pay | Admitting: Cardiovascular Disease

## 2012-10-25 NOTE — Telephone Encounter (Signed)
Pt has new pcp/ labs and ov note sent to her at pt's request.

## 2012-10-25 NOTE — Telephone Encounter (Signed)
New prob  Pt would like to give you some information from one of her other providers.

## 2012-11-14 ENCOUNTER — Ambulatory Visit (INDEPENDENT_AMBULATORY_CARE_PROVIDER_SITE_OTHER): Payer: Medicare Other | Admitting: Pharmacist

## 2012-11-14 DIAGNOSIS — I82409 Acute embolism and thrombosis of unspecified deep veins of unspecified lower extremity: Secondary | ICD-10-CM

## 2012-11-14 DIAGNOSIS — I2699 Other pulmonary embolism without acute cor pulmonale: Secondary | ICD-10-CM

## 2012-11-14 LAB — POCT INR: INR: 2

## 2012-12-12 ENCOUNTER — Ambulatory Visit (INDEPENDENT_AMBULATORY_CARE_PROVIDER_SITE_OTHER): Payer: Medicare Other | Admitting: *Deleted

## 2012-12-12 DIAGNOSIS — I82409 Acute embolism and thrombosis of unspecified deep veins of unspecified lower extremity: Secondary | ICD-10-CM

## 2012-12-12 DIAGNOSIS — I2699 Other pulmonary embolism without acute cor pulmonale: Secondary | ICD-10-CM

## 2013-01-11 ENCOUNTER — Ambulatory Visit: Payer: Medicare Other | Admitting: Internal Medicine

## 2013-01-11 DIAGNOSIS — Z0289 Encounter for other administrative examinations: Secondary | ICD-10-CM

## 2013-01-16 ENCOUNTER — Ambulatory Visit (INDEPENDENT_AMBULATORY_CARE_PROVIDER_SITE_OTHER): Payer: Medicare Other | Admitting: *Deleted

## 2013-01-16 DIAGNOSIS — I82409 Acute embolism and thrombosis of unspecified deep veins of unspecified lower extremity: Secondary | ICD-10-CM

## 2013-01-16 DIAGNOSIS — I2699 Other pulmonary embolism without acute cor pulmonale: Secondary | ICD-10-CM

## 2013-03-05 ENCOUNTER — Ambulatory Visit (INDEPENDENT_AMBULATORY_CARE_PROVIDER_SITE_OTHER): Payer: Medicare Other | Admitting: *Deleted

## 2013-03-05 DIAGNOSIS — I2699 Other pulmonary embolism without acute cor pulmonale: Secondary | ICD-10-CM

## 2013-03-05 DIAGNOSIS — I82409 Acute embolism and thrombosis of unspecified deep veins of unspecified lower extremity: Secondary | ICD-10-CM

## 2013-03-05 LAB — POCT INR: INR: 3.8

## 2013-03-08 ENCOUNTER — Other Ambulatory Visit: Payer: Self-pay | Admitting: Cardiovascular Disease

## 2013-03-26 ENCOUNTER — Ambulatory Visit (INDEPENDENT_AMBULATORY_CARE_PROVIDER_SITE_OTHER): Payer: Medicare Other | Admitting: *Deleted

## 2013-03-26 DIAGNOSIS — I2699 Other pulmonary embolism without acute cor pulmonale: Secondary | ICD-10-CM

## 2013-03-26 DIAGNOSIS — I82409 Acute embolism and thrombosis of unspecified deep veins of unspecified lower extremity: Secondary | ICD-10-CM

## 2013-03-26 LAB — POCT INR: INR: 3

## 2013-03-27 ENCOUNTER — Ambulatory Visit: Payer: Medicare Other | Admitting: Cardiovascular Disease

## 2013-04-07 ENCOUNTER — Other Ambulatory Visit: Payer: Self-pay | Admitting: Cardiovascular Disease

## 2013-04-11 ENCOUNTER — Other Ambulatory Visit: Payer: Self-pay

## 2013-04-11 MED ORDER — AMLODIPINE BESYLATE 5 MG PO TABS: 5.0000 mg | ORAL_TABLET | Freq: Every day | ORAL | Status: DC

## 2013-04-26 ENCOUNTER — Ambulatory Visit: Payer: Medicare Other | Admitting: Cardiovascular Disease

## 2013-05-10 ENCOUNTER — Ambulatory Visit: Payer: Medicare Other | Admitting: Cardiovascular Disease

## 2013-05-14 ENCOUNTER — Encounter: Payer: Self-pay | Admitting: Cardiovascular Disease

## 2013-06-06 ENCOUNTER — Ambulatory Visit (INDEPENDENT_AMBULATORY_CARE_PROVIDER_SITE_OTHER): Payer: Medicare Other

## 2013-06-06 DIAGNOSIS — I2699 Other pulmonary embolism without acute cor pulmonale: Secondary | ICD-10-CM

## 2013-06-06 DIAGNOSIS — Z5181 Encounter for therapeutic drug level monitoring: Secondary | ICD-10-CM | POA: Insufficient documentation

## 2013-06-06 DIAGNOSIS — I82409 Acute embolism and thrombosis of unspecified deep veins of unspecified lower extremity: Secondary | ICD-10-CM

## 2013-06-06 LAB — POCT INR: INR: 3.1

## 2013-07-04 ENCOUNTER — Ambulatory Visit (INDEPENDENT_AMBULATORY_CARE_PROVIDER_SITE_OTHER): Payer: Medicare Other

## 2013-07-04 DIAGNOSIS — I2699 Other pulmonary embolism without acute cor pulmonale: Secondary | ICD-10-CM

## 2013-07-04 DIAGNOSIS — Z5181 Encounter for therapeutic drug level monitoring: Secondary | ICD-10-CM

## 2013-07-04 DIAGNOSIS — I82409 Acute embolism and thrombosis of unspecified deep veins of unspecified lower extremity: Secondary | ICD-10-CM

## 2013-07-04 LAB — POCT INR: INR: 3.1

## 2013-08-01 ENCOUNTER — Ambulatory Visit (INDEPENDENT_AMBULATORY_CARE_PROVIDER_SITE_OTHER): Payer: Medicare Other

## 2013-08-01 DIAGNOSIS — Z5181 Encounter for therapeutic drug level monitoring: Secondary | ICD-10-CM

## 2013-08-01 DIAGNOSIS — I2699 Other pulmonary embolism without acute cor pulmonale: Secondary | ICD-10-CM

## 2013-08-01 DIAGNOSIS — I82409 Acute embolism and thrombosis of unspecified deep veins of unspecified lower extremity: Secondary | ICD-10-CM

## 2013-08-01 LAB — POCT INR: INR: 3.7

## 2013-08-04 ENCOUNTER — Other Ambulatory Visit: Payer: Self-pay | Admitting: Cardiovascular Disease

## 2013-08-15 ENCOUNTER — Ambulatory Visit (INDEPENDENT_AMBULATORY_CARE_PROVIDER_SITE_OTHER): Payer: Medicare Other

## 2013-08-15 DIAGNOSIS — Z5181 Encounter for therapeutic drug level monitoring: Secondary | ICD-10-CM

## 2013-08-15 DIAGNOSIS — I2699 Other pulmonary embolism without acute cor pulmonale: Secondary | ICD-10-CM

## 2013-08-15 DIAGNOSIS — I82409 Acute embolism and thrombosis of unspecified deep veins of unspecified lower extremity: Secondary | ICD-10-CM

## 2013-08-15 LAB — POCT INR: INR: 2.7

## 2013-08-20 ENCOUNTER — Other Ambulatory Visit: Payer: Self-pay | Admitting: Cardiovascular Disease

## 2013-09-04 ENCOUNTER — Other Ambulatory Visit: Payer: Self-pay | Admitting: Cardiovascular Disease

## 2013-09-12 ENCOUNTER — Ambulatory Visit (INDEPENDENT_AMBULATORY_CARE_PROVIDER_SITE_OTHER): Payer: Medicare Other | Admitting: *Deleted

## 2013-09-12 DIAGNOSIS — I82409 Acute embolism and thrombosis of unspecified deep veins of unspecified lower extremity: Secondary | ICD-10-CM

## 2013-09-12 DIAGNOSIS — Z5181 Encounter for therapeutic drug level monitoring: Secondary | ICD-10-CM

## 2013-09-12 DIAGNOSIS — I2699 Other pulmonary embolism without acute cor pulmonale: Secondary | ICD-10-CM

## 2013-09-12 LAB — POCT INR: INR: 2

## 2013-10-03 ENCOUNTER — Other Ambulatory Visit: Payer: Self-pay | Admitting: Cardiovascular Disease

## 2013-10-10 ENCOUNTER — Ambulatory Visit (INDEPENDENT_AMBULATORY_CARE_PROVIDER_SITE_OTHER): Payer: Medicare Other | Admitting: Pharmacist

## 2013-10-10 DIAGNOSIS — I2699 Other pulmonary embolism without acute cor pulmonale: Secondary | ICD-10-CM

## 2013-10-10 DIAGNOSIS — I82409 Acute embolism and thrombosis of unspecified deep veins of unspecified lower extremity: Secondary | ICD-10-CM

## 2013-10-10 DIAGNOSIS — Z5181 Encounter for therapeutic drug level monitoring: Secondary | ICD-10-CM

## 2013-10-10 LAB — POCT INR: INR: 1.5

## 2013-10-22 ENCOUNTER — Other Ambulatory Visit: Payer: Self-pay | Admitting: Cardiovascular Disease

## 2013-11-17 ENCOUNTER — Other Ambulatory Visit: Payer: Self-pay | Admitting: Cardiovascular Disease

## 2013-12-15 ENCOUNTER — Other Ambulatory Visit: Payer: Self-pay | Admitting: Cardiovascular Disease

## 2014-01-05 ENCOUNTER — Other Ambulatory Visit: Payer: Self-pay | Admitting: Cardiovascular Disease

## 2014-01-08 NOTE — Telephone Encounter (Signed)
Patient must come in for appointment on 11/9 for further refills

## 2014-01-20 ENCOUNTER — Encounter: Payer: Self-pay | Admitting: Cardiovascular Disease

## 2014-01-20 ENCOUNTER — Ambulatory Visit (INDEPENDENT_AMBULATORY_CARE_PROVIDER_SITE_OTHER): Payer: Medicare Other

## 2014-01-20 ENCOUNTER — Ambulatory Visit (INDEPENDENT_AMBULATORY_CARE_PROVIDER_SITE_OTHER): Payer: Medicare Other | Admitting: Cardiovascular Disease

## 2014-01-20 VITALS — BP 182/84 | HR 93 | Ht 59.0 in | Wt 194.8 lb

## 2014-01-20 DIAGNOSIS — Z5181 Encounter for therapeutic drug level monitoring: Secondary | ICD-10-CM

## 2014-01-20 DIAGNOSIS — I82409 Acute embolism and thrombosis of unspecified deep veins of unspecified lower extremity: Secondary | ICD-10-CM

## 2014-01-20 DIAGNOSIS — I10 Essential (primary) hypertension: Secondary | ICD-10-CM

## 2014-01-20 DIAGNOSIS — I2699 Other pulmonary embolism without acute cor pulmonale: Secondary | ICD-10-CM

## 2014-01-20 LAB — POCT INR: INR: 1.9

## 2014-01-20 MED ORDER — CARVEDILOL 6.25 MG PO TABS: 6.2500 mg | ORAL_TABLET | Freq: Two times a day (BID) | ORAL | Status: DC

## 2014-01-20 MED ORDER — AMLODIPINE BESYLATE 5 MG PO TABS: 5.0000 mg | ORAL_TABLET | Freq: Every day | ORAL | Status: DC

## 2014-01-20 NOTE — Assessment & Plan Note (Signed)
Virginia Summers has been out of her carvedilol for several months. Her blood pressure and heart rate are elevated. We'll restart her carvedilol 6.25 mg twice a day.  I'll see her back in 6 months . I've advised her to take her blood pressure on a regular basis.

## 2014-01-20 NOTE — Patient Instructions (Addendum)
Your physician has recommended you make the following change in your medication:  TAKE Coreg (Carvedilol) 6.25 mg twice daily   Your physician has requested that you regularly monitor and record your blood pressure readings at home. Please use the same machine at the same time of day to check your readings and record them and call Eligha BridegroomMichelle Lively Haberman, RN [phone]) in 2 weeks to report   Your physician wants you to follow-up in: 6 months with Dr. Elease HashimotoNahser.  You will receive a reminder letter in the mail two months in advance. If you don't receive a letter, please call our office to schedule the follow-up appointment.

## 2014-01-20 NOTE — Assessment & Plan Note (Addendum)
Continue with coumadin. 

## 2014-01-20 NOTE — Progress Notes (Signed)
Racheal A Gillingham Date of Birth  [birth year] [facility]     [place] Office  [address]    [address]   [address] [place], [place]  [id number]    [place], [place]  [id number] [phone]  Fax  [phone]  [phone]  Fax [phone]  Problem list: 1. Hypothyroidism 2. Deep vein thrombosis 3. Pulmonary embolus 4. Hypertension 5. Chest pain - normal cath 2008  History of Present Illness:  She has continued to have severe DOE.  This has been present since her PE.  She has noticed loss of indigestion-like pain for the past several months.  This happens typically at night when she is lying down.  She has been trying to lose weight but has not been able to lose weight consistently.  Nov. 20, 2013 - [place] has been about the same.  been about the same. She continues to have some shortness breath. She fell this past summer and broke her left ankle in 2 spots.  She also has some episodes of lightheadedness or dizziness.  Some aspects of these symptoms sound like vertigo.   These symptoms are not related to orthostasis.    September 12, 2012:  Lura Ematsy is very emotional today - her husband passed away several months ago.  He was in the hospital for lung problems and apparently developed c-diff colitis.    He has continued to have worsening dyspnea.   She has also had some unsteadyness when walking  Nov. 9, 2015:  She ran out of her BP meds and restarted it back a week She has been on Prednisone for 5-6 months and has gained ~ 30 lbs.   Current Outpatient Prescriptions on File Prior to Visit  Medication Sig Dispense Refill  . acetaminophen (TYLENOL) 500 MG tablet Take 1,000 mg by mouth every 6 (six) hours as needed. For pain.    Marland Kitchen. albuterol (PROVENTIL HFA) 108 (90 BASE) MCG/ACT inhaler Inhale 2 puffs into the lungs every 4 (four) hours as needed. For shortness of breath.    Marland Kitchen. amLODipine (NORVASC) 5 MG tablet TAKE 1 TABLET (5 MG TOTAL) BY MOUTH DAILY. 15 tablet 0  . buPROPion (WELLBUTRIN XL) 150 MG  24 hr tablet Take 150 mg by mouth daily.    . carvedilol (COREG) 6.25 MG tablet TAKE 1 TABLET BY MOUTH TWO TIMES DAILY. 14 tablet 0  . levothyroxine (SYNTHROID, LEVOTHROID) 25 MCG tablet TAKE 1 TABLET (25 MCG TOTAL) BY MOUTH DAILY. 30 tablet 0  . loratadine (CLARITIN) 10 MG tablet Take 10 mg by mouth daily as needed. For allergies.    Marland Kitchen. losartan-hydrochlorothiazide (HYZAAR) 50-12.5 MG per tablet Take 1 tablet by mouth daily.    . metFORMIN (GLUCOPHAGE) 500 MG tablet Take 500 mg by mouth 2 (two) times daily with a meal.    . pantoprazole (PROTONIX) 40 MG tablet Take 40 mg by mouth daily.    . predniSONE (DELTASONE) 5 MG tablet Take 10 mg by mouth daily with breakfast.    . traZODone (DESYREL) 50 MG tablet Take 50 mg by mouth as needed.    . warfarin (COUMADIN) 5 MG tablet TAKE AS DIRECTED BY ANTICOAGULATION CLINIC 40 tablet 3   No current facility-administered medications on file prior to visit.    Allergies  Allergen Reactions  . Penicillins Swelling and Rash  . Sulfa Drugs Cross Reactors Swelling and Rash    Past Medical History  Diagnosis Date  . DVT (deep venous thrombosis)     BILATERAL, March 2012  .  Pulmonary embolism   . HTN (hypertension)   . Diabetes mellitus   . Chest pain   . Hypothyroidism   . GERD (gastroesophageal reflux disease)   . Diverticulitis   . Anticoagulant long-term use     Past Surgical History  Procedure Laterality Date  . Back surgery  2000    DISC  . Partial hysterectomy  AGE 4's APPROX.  Marland Kitchen. Appendectomy  AGE 29  . Shoulder surgery  5-6 YRS OLD    PINS  . Secondary intraocular lense implantation  [id number]    OU    History  Smoking status  . Former Smoker -- 0.50 packs/day for 30 years  . Types: Cigarettes  . Quit date: 03/15/2003  Smokeless tobacco  . Never Used    History  Alcohol Use No    Family History  Problem Relation Age of Onset  . Transient ischemic attack Father   . Coronary artery disease Father   . Stroke Mother    . Diabetes Mother   . Diabetes Sister   . Hypertension Daughter     Reviw of Systems:  Reviewed in the HPI.  All other systems are negative.  Physical Exam: Blood pressure 182/84, pulse 93, height 4\' 11"  (1.499 m), weight 194 lb 12.8 oz (88.361 kg). Body mass index is 39.32 kg/(m^2).  General: Well developed, well nourished, in no acute distress.  Head: Normocephalic, atraumatic, sclera non-icteric, mucus membranes are moist,   Neck: Supple. Carotids are 2 + without bruits. No JVD  Lungs: Clear bilaterally to auscultation.    Heart: regular rate  With normal  S1 S2. No murmurs, gallops or rubs.  Abdomen: Soft, non-tender, non-distended with normal bowel sounds. No hepatomegaly. No rebound/guarding. No masses.  Msk:  Strength and tone are normal  Extremities: No clubbing or cyanosis. No edema.  Distal pedal pulses are 2+ and equal bilaterally.  Neuro: Alert and oriented X 3. Moves all extremities spontaneously.  Psych:  Responds to questions appropriately with a normal affect.  ECG: Nov. 9, 2015: NSR at 93.  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

## 2014-02-05 ENCOUNTER — Other Ambulatory Visit: Payer: Self-pay | Admitting: Cardiovascular Disease

## 2014-04-03 ENCOUNTER — Other Ambulatory Visit: Payer: Self-pay | Admitting: Nurse Practitioner

## 2014-04-03 DIAGNOSIS — Z1231 Encounter for screening mammogram for malignant neoplasm of breast: Secondary | ICD-10-CM

## 2014-04-16 ENCOUNTER — Telehealth: Payer: Self-pay | Admitting: *Deleted

## 2014-04-16 ENCOUNTER — Ambulatory Visit: Payer: Medicare Other

## 2014-04-16 ENCOUNTER — Other Ambulatory Visit: Payer: Self-pay | Admitting: Cardiovascular Disease

## 2014-04-16 NOTE — Telephone Encounter (Signed)
Spoke with pt as she has not been seen since November. She states has had cough and cold and has been very sick since January 6th and is weak and cannot come in to have INR checked this weekl States has expectorated some blood in her sputum. Stressed to her the importance of having INR checked as ordered to prevent any bleeding or stroke.Pt states she has 13 tablets of coumadin and that is more than enough until she can see us on Monday the 8th. Stressed to pt to call  if she is unable to keep her appt as scheduled and she states understanding.

## 2014-04-21 ENCOUNTER — Ambulatory Visit (INDEPENDENT_AMBULATORY_CARE_PROVIDER_SITE_OTHER): Payer: Medicare HMO | Admitting: Pharmacist

## 2014-04-21 DIAGNOSIS — I2699 Other pulmonary embolism without acute cor pulmonale: Secondary | ICD-10-CM

## 2014-04-21 DIAGNOSIS — Z5181 Encounter for therapeutic drug level monitoring: Secondary | ICD-10-CM

## 2014-04-21 DIAGNOSIS — I82409 Acute embolism and thrombosis of unspecified deep veins of unspecified lower extremity: Secondary | ICD-10-CM

## 2014-04-21 LAB — POCT INR: INR: 2.1

## 2014-05-01 ENCOUNTER — Ambulatory Visit: Payer: Medicare Other

## 2014-05-19 ENCOUNTER — Ambulatory Visit (INDEPENDENT_AMBULATORY_CARE_PROVIDER_SITE_OTHER): Payer: Medicare HMO

## 2014-05-19 DIAGNOSIS — Z5181 Encounter for therapeutic drug level monitoring: Secondary | ICD-10-CM

## 2014-05-19 DIAGNOSIS — I82409 Acute embolism and thrombosis of unspecified deep veins of unspecified lower extremity: Secondary | ICD-10-CM

## 2014-05-19 DIAGNOSIS — I2699 Other pulmonary embolism without acute cor pulmonale: Secondary | ICD-10-CM

## 2014-05-19 LAB — POCT INR: INR: 1.2

## 2014-05-29 ENCOUNTER — Ambulatory Visit (INDEPENDENT_AMBULATORY_CARE_PROVIDER_SITE_OTHER): Payer: Medicare HMO | Admitting: *Deleted

## 2014-05-29 DIAGNOSIS — I82409 Acute embolism and thrombosis of unspecified deep veins of unspecified lower extremity: Secondary | ICD-10-CM

## 2014-05-29 DIAGNOSIS — I2699 Other pulmonary embolism without acute cor pulmonale: Secondary | ICD-10-CM

## 2014-05-29 DIAGNOSIS — Z5181 Encounter for therapeutic drug level monitoring: Secondary | ICD-10-CM

## 2014-05-29 LAB — POCT INR: INR: 2.2

## 2014-07-02 ENCOUNTER — Other Ambulatory Visit: Payer: Self-pay | Admitting: Cardiovascular Disease

## 2014-11-15 ENCOUNTER — Other Ambulatory Visit: Payer: Self-pay | Admitting: Cardiovascular Disease

## 2014-11-20 ENCOUNTER — Ambulatory Visit (INDEPENDENT_AMBULATORY_CARE_PROVIDER_SITE_OTHER): Payer: Medicare HMO

## 2014-11-20 DIAGNOSIS — I82409 Acute embolism and thrombosis of unspecified deep veins of unspecified lower extremity: Secondary | ICD-10-CM | POA: Diagnosis not present

## 2014-11-20 DIAGNOSIS — Z5181 Encounter for therapeutic drug level monitoring: Secondary | ICD-10-CM | POA: Diagnosis not present

## 2014-11-20 DIAGNOSIS — I2699 Other pulmonary embolism without acute cor pulmonale: Secondary | ICD-10-CM | POA: Diagnosis not present

## 2014-11-20 LAB — POCT INR: INR: 1.6

## 2014-11-20 MED ORDER — WARFARIN SODIUM 5 MG PO TABS: ORAL_TABLET | ORAL | Status: DC

## 2014-12-04 ENCOUNTER — Ambulatory Visit (INDEPENDENT_AMBULATORY_CARE_PROVIDER_SITE_OTHER): Payer: Medicare HMO | Admitting: *Deleted

## 2014-12-04 DIAGNOSIS — Z5181 Encounter for therapeutic drug level monitoring: Secondary | ICD-10-CM | POA: Diagnosis not present

## 2014-12-04 DIAGNOSIS — I2699 Other pulmonary embolism without acute cor pulmonale: Secondary | ICD-10-CM

## 2014-12-04 DIAGNOSIS — I82409 Acute embolism and thrombosis of unspecified deep veins of unspecified lower extremity: Secondary | ICD-10-CM

## 2014-12-04 LAB — POCT INR: INR: 1.5

## 2014-12-15 ENCOUNTER — Ambulatory Visit (INDEPENDENT_AMBULATORY_CARE_PROVIDER_SITE_OTHER): Payer: Medicare HMO

## 2014-12-15 DIAGNOSIS — I82409 Acute embolism and thrombosis of unspecified deep veins of unspecified lower extremity: Secondary | ICD-10-CM | POA: Diagnosis not present

## 2014-12-15 DIAGNOSIS — Z5181 Encounter for therapeutic drug level monitoring: Secondary | ICD-10-CM | POA: Diagnosis not present

## 2014-12-15 DIAGNOSIS — I2699 Other pulmonary embolism without acute cor pulmonale: Secondary | ICD-10-CM

## 2014-12-15 LAB — POCT INR: INR: 3.2

## 2015-01-14 ENCOUNTER — Other Ambulatory Visit: Payer: Self-pay | Admitting: Cardiovascular Disease

## 2015-01-20 ENCOUNTER — Other Ambulatory Visit: Payer: Self-pay | Admitting: Cardiovascular Disease

## 2015-04-08 ENCOUNTER — Other Ambulatory Visit: Payer: Self-pay | Admitting: Cardiovascular Disease

## 2015-04-16 ENCOUNTER — Telehealth: Payer: Self-pay | Admitting: *Deleted

## 2015-04-16 ENCOUNTER — Encounter: Payer: Self-pay | Admitting: Cardiovascular Disease

## 2015-04-16 ENCOUNTER — Ambulatory Visit (INDEPENDENT_AMBULATORY_CARE_PROVIDER_SITE_OTHER): Payer: Medicare HMO | Admitting: Cardiovascular Disease

## 2015-04-16 ENCOUNTER — Other Ambulatory Visit: Payer: Self-pay | Admitting: *Deleted

## 2015-04-16 VITALS — BP 144/80 | HR 86 | Ht 59.0 in | Wt 184.0 lb

## 2015-04-16 DIAGNOSIS — I10 Essential (primary) hypertension: Secondary | ICD-10-CM | POA: Diagnosis not present

## 2015-04-16 DIAGNOSIS — I2782 Chronic pulmonary embolism: Secondary | ICD-10-CM

## 2015-04-16 MED ORDER — AMLODIPINE BESYLATE 5 MG PO TABS: ORAL_TABLET | ORAL | Status: DC

## 2015-04-16 MED ORDER — LOSARTAN POTASSIUM-HCTZ 100-25 MG PO TABS: 1.0000 | ORAL_TABLET | Freq: Every day | ORAL | Status: DC

## 2015-04-16 MED ORDER — CARVEDILOL 6.25 MG PO TABS: ORAL_TABLET | ORAL | Status: DC

## 2015-04-16 NOTE — Telephone Encounter (Signed)
Patient stated that she was seen today and was told to call her insurance company to inquire about prices of other anticoagulants. She stated that all of them are going to be too expensive and she prefers to remain on warfarin.

## 2015-04-16 NOTE — Telephone Encounter (Signed)
**Note De-identified Virginia Summers Obfuscation** Will forward to Dr. Nahser. °

## 2015-04-16 NOTE — Progress Notes (Signed)
Virginia Summers Date of Birth  1941-03-11 [facility] Office  [address]    [address]   [address] [place], [place]  [address]    [place], [place]  [address] [phone]  Fax  [phone]  [phone]  Fax [phone]  Problem list: 1. Hypothyroidism 2. Deep vein thrombosis 3. Pulmonary embolus 4. Hypertension 5. Chest pain - normal cath 2008  History of Present Illness:  She has continued to have severe DOE.  This has been present since her PE.  She has noticed loss of indigestion-like pain for the past several months.  This happens typically at night when she is lying down.  She has been trying to lose weight but has not been able to lose weight consistently.  Nov. 20, 2013 - Virginia Summers has been about the same. She continues to have some shortness breath. She fell this past summer and broke her left ankle in 2 spots.  She also has some episodes of lightheadedness or dizziness.  Some aspects of these symptoms sound like vertigo.   These symptoms are not related to orthostasis.    September 12, 2012:  Virginia Summers is very emotional today - her husband passed away several months ago.  He was in the hospital for lung problems and apparently developed c-diff colitis.    He has continued to have worsening dyspnea.   She has also had some unsteadyness when walking  Nov. 9, 2015:  She ran out of her BP meds and restarted it back a week She has been on Prednisone for 5-6 months and has gained ~ 30 lbs.  Feb. 2, 2017:  Virginia Summers is doing ok Has had some swelling with amlodipine 10 mg  - she cut the dose to 5 mg and is feeling better. Has occasional palpitations that sound like PVCs  Has occasional jaw pain. Stopped taking her coumadin  - didn't like the cost and trouble of getting levels checked  We discussed  DOACs  Exercising  - walks her dog 3 times a day ( [place] =- poodle mix )    Current Outpatient Prescriptions on File Prior to Visit  Medication Sig  Dispense Refill  . acetaminophen (TYLENOL) 500 MG tablet Take 1,000 mg by mouth every 6 (six) hours as needed. For pain.    Marland Kitchen albuterol (PROVENTIL HFA) 108 (90 BASE) MCG/ACT inhaler Inhale 2 puffs into the lungs every 4 (four) hours as needed. For shortness of breath.    Marland Kitchen amLODipine (NORVASC) 5 MG tablet TAKE 1 TABLET (5 MG TOTAL) BY MOUTH DAILY. 30 tablet 1  . carvedilol (COREG) 6.25 MG tablet TAKE 1 TABLET (6.25 MG TOTAL) BY MOUTH 2 (TWO) TIMES DAILY WITH A MEAL. 30 tablet 0  . cetirizine (ZYRTEC) 10 MG tablet Take 10 mg by mouth daily.    Marland Kitchen esomeprazole (NEXIUM) 20 MG capsule Take 20 mg by mouth daily at 12 noon.    Marland Kitchen levothyroxine (SYNTHROID, LEVOTHROID) 25 MCG tablet TAKE 1 TABLET (25 MCG TOTAL) BY MOUTH DAILY. 30 tablet 0  . losartan-hydrochlorothiazide (HYZAAR) 100-25 MG per tablet Take 1 tablet by mouth daily. Reported on 04/16/2015    . warfarin (COUMADIN) 5 MG tablet TAKE AS DIRECTED BY ANTICOAGULATION CLINIC (Patient not taking: Reported on 04/16/2015) 40 tablet 1   No current facility-administered medications on file prior to visit.    Allergies  Allergen Reactions  . Penicillins Swelling and Rash  . Sulfa Drugs Cross Reactors Swelling and  Rash    Past Medical History  Diagnosis Date  . DVT (deep venous thrombosis) (HCC)     BILATERAL, March 2012  . Pulmonary embolism (HCC)   . HTN (hypertension)   . Diabetes mellitus   . Chest pain   . Hypothyroidism   . GERD (gastroesophageal reflux disease)   . Diverticulitis   . Anticoagulant long-term use     Past Surgical History  Procedure Laterality Date  . Back surgery  2000    DISC  . Partial hysterectomy  AGE 21's APPROX.  Marland Kitchen Appendectomy  AGE 6  . Shoulder surgery  5-6 YRS OLD    PINS  . Secondary intraocular lense implantation  [id number]    OU    History  Smoking status  . Former Smoker -- 0.50 packs/day for 30 years  . Types: Cigarettes  . Quit date: 03/15/2003  Smokeless tobacco  . Never Used    History   Alcohol Use No    Family History  Problem Relation Age of Onset  . Transient ischemic attack Father   . Coronary artery disease Father   . Stroke Mother   . Diabetes Mother   . Diabetes Sister   . Hypertension Daughter     Reviw of Systems:  Reviewed in the HPI.  All other systems are negative.  Physical Exam: Blood pressure 144/80, pulse 86, height  (1.499 m), weight 184 lb (83.462 kg). Body mass index is 37.14 kg/(m^2).  General: Well developed, well nourished, in no acute distress.  Head: Normocephalic, atraumatic, sclera non-icteric, mucus membranes are moist,   Neck: Supple. Carotids are 2 + without bruits. No JVD  Lungs: Clear bilaterally to auscultation.    Heart: regular rate  With normal  S1 S2. No murmurs, gallops or rubs.  Abdomen: Soft, non-tender, non-distended with normal bowel sounds. No hepatomegaly. No rebound/guarding. No masses.  Msk:  Strength and tone are normal  Extremities: No clubbing or cyanosis. No edema.  Distal pedal pulses are 2+ and equal bilaterally.  Neuro: Alert and oriented X 3. Moves all extremities spontaneously.  Psych:  Responds to questions appropriately with a normal affect.  ECG: 04/16/2015: Normal sinus rhythm at 86. She has occasional premature atrial contractions. There is a nonspecific IVCD.   Assessment / Plan:   1. Hypothyroidism - further management her internal medicine doctor. 2. Deep vein thrombosis :   she stopped her Coumadin because she did not coming over for the INR checks. I've given her information regarding the DOACS.   She'll see which one of these is on her medicine plan. 3. Pulmonary embolus -  Needs to be on anticoagulation .   She had an unprovoked Pulmonary embolus   4. Hypertension- I've refilled her losartan-HCT and her carvedilol. Continue current medications. 5. Chest pain - normal cath 2008    Chavela Justiniano, Deloris Ping, MD  04/16/2015 11:40 AM    [facility] [address]  [place],  [address] [place], [place]  [address] Pager [phone] Phone: [phone]; Fax: [phone]   [facility]  [address] [place], [place]  [address] [phone]   Fax [phone]

## 2015-04-16 NOTE — Patient Instructions (Addendum)
Check the price of the following anticoagulants:  Pradaxa 150 mg twice a day Xarelto 20 mg a day Eliquis 5 mg twice a day Savaysa 60 mg a day.  Medication Instructions:  Same-no changes  Labwork: None  Testing/Procedures: None  Follow-Up: Your physician wants you to follow-up in: 1 year. You will receive a reminder letter in the mail two months in advance. If you don't receive a letter, please call our office to schedule the follow-up appointment.      If you need a refill on your cardiac medications before your next appointment, please call your pharmacy.

## 2015-04-17 MED ORDER — WARFARIN SODIUM 5 MG PO TABS: ORAL_TABLET | ORAL | Status: DC

## 2015-04-17 NOTE — Telephone Encounter (Signed)
**Note De-Identified Omunique Pederson Obfuscation** The pt is advised and she is in agreement with plan. Warfarin RX sent to [facility] on [address] to fill per her request. The pt is already scheduled to be seen in the [facility] on 2/6.

## 2015-04-17 NOTE — Telephone Encounter (Signed)
Lets resume coumadin at her previous dose. Refer to coumadin clinic for follow up

## 2015-04-20 ENCOUNTER — Ambulatory Visit (INDEPENDENT_AMBULATORY_CARE_PROVIDER_SITE_OTHER): Payer: Medicare HMO

## 2015-04-20 DIAGNOSIS — I2782 Chronic pulmonary embolism: Secondary | ICD-10-CM

## 2015-04-20 DIAGNOSIS — I82409 Acute embolism and thrombosis of unspecified deep veins of unspecified lower extremity: Secondary | ICD-10-CM

## 2015-04-20 DIAGNOSIS — Z5181 Encounter for therapeutic drug level monitoring: Secondary | ICD-10-CM

## 2015-04-20 DIAGNOSIS — I2699 Other pulmonary embolism without acute cor pulmonale: Secondary | ICD-10-CM | POA: Diagnosis not present

## 2015-04-20 LAB — POCT INR: INR: 1

## 2015-05-04 ENCOUNTER — Ambulatory Visit (INDEPENDENT_AMBULATORY_CARE_PROVIDER_SITE_OTHER): Payer: Medicare HMO | Admitting: *Deleted

## 2015-05-04 DIAGNOSIS — I2782 Chronic pulmonary embolism: Secondary | ICD-10-CM | POA: Diagnosis not present

## 2015-05-04 DIAGNOSIS — I2699 Other pulmonary embolism without acute cor pulmonale: Secondary | ICD-10-CM

## 2015-05-04 DIAGNOSIS — Z5181 Encounter for therapeutic drug level monitoring: Secondary | ICD-10-CM | POA: Diagnosis not present

## 2015-05-04 DIAGNOSIS — I82409 Acute embolism and thrombosis of unspecified deep veins of unspecified lower extremity: Secondary | ICD-10-CM | POA: Diagnosis not present

## 2015-05-04 LAB — POCT INR: INR: 2.9

## 2015-06-03 ENCOUNTER — Ambulatory Visit (INDEPENDENT_AMBULATORY_CARE_PROVIDER_SITE_OTHER): Payer: Medicare HMO | Admitting: *Deleted

## 2015-06-03 DIAGNOSIS — I2699 Other pulmonary embolism without acute cor pulmonale: Secondary | ICD-10-CM | POA: Diagnosis not present

## 2015-06-03 DIAGNOSIS — I82409 Acute embolism and thrombosis of unspecified deep veins of unspecified lower extremity: Secondary | ICD-10-CM | POA: Diagnosis not present

## 2015-06-03 DIAGNOSIS — I2782 Chronic pulmonary embolism: Secondary | ICD-10-CM

## 2015-06-03 DIAGNOSIS — Z5181 Encounter for therapeutic drug level monitoring: Secondary | ICD-10-CM

## 2015-06-03 LAB — POCT INR: INR: 1.8

## 2015-07-01 ENCOUNTER — Ambulatory Visit (INDEPENDENT_AMBULATORY_CARE_PROVIDER_SITE_OTHER): Payer: Medicare HMO | Admitting: *Deleted

## 2015-07-01 DIAGNOSIS — I82409 Acute embolism and thrombosis of unspecified deep veins of unspecified lower extremity: Secondary | ICD-10-CM | POA: Diagnosis not present

## 2015-07-01 DIAGNOSIS — I2699 Other pulmonary embolism without acute cor pulmonale: Secondary | ICD-10-CM | POA: Diagnosis not present

## 2015-07-01 DIAGNOSIS — I2782 Chronic pulmonary embolism: Secondary | ICD-10-CM | POA: Diagnosis not present

## 2015-07-01 DIAGNOSIS — Z5181 Encounter for therapeutic drug level monitoring: Secondary | ICD-10-CM

## 2015-07-01 LAB — POCT INR: INR: 3.6

## 2015-07-09 ENCOUNTER — Other Ambulatory Visit: Payer: Self-pay | Admitting: Cardiovascular Disease

## 2015-07-11 ENCOUNTER — Other Ambulatory Visit: Payer: Self-pay | Admitting: Cardiovascular Disease

## 2015-07-16 ENCOUNTER — Other Ambulatory Visit: Payer: Self-pay | Admitting: Cardiovascular Disease

## 2015-07-16 NOTE — Telephone Encounter (Signed)
[facility] refill [facility] [address] on 07/09/15, receipt confirmed by pharmacy.  Called pharmacy they state rx has been filled and is ready for pick-up.  Called spoke with pt advised Warfarin refill was sent to pharmacy on 07/09/15 and they state it is ready for pick-up.  Pt states she has been out of Coumadin and has not taken any x 1 week.  Pt's normal dosage of Coumadin is 5mg  daily, advised pt to take 7.5mg  today and tomorrow, then resume previous dosage of 5mg  daily.  Change recheck appt from 07/20/15 to 07/23/15 since pt has been out of Coumadin and not taking x 1 week.

## 2015-07-16 NOTE — Telephone Encounter (Signed)
Patient stated that she was informed by [facility] that they did not have a new rx for this and she has been without her medication.

## 2015-07-23 ENCOUNTER — Ambulatory Visit (INDEPENDENT_AMBULATORY_CARE_PROVIDER_SITE_OTHER): Payer: Medicare HMO | Admitting: *Deleted

## 2015-07-23 DIAGNOSIS — I82409 Acute embolism and thrombosis of unspecified deep veins of unspecified lower extremity: Secondary | ICD-10-CM | POA: Diagnosis not present

## 2015-07-23 DIAGNOSIS — I2699 Other pulmonary embolism without acute cor pulmonale: Secondary | ICD-10-CM

## 2015-07-23 DIAGNOSIS — Z5181 Encounter for therapeutic drug level monitoring: Secondary | ICD-10-CM | POA: Diagnosis not present

## 2015-07-23 DIAGNOSIS — I2782 Chronic pulmonary embolism: Secondary | ICD-10-CM | POA: Diagnosis not present

## 2015-07-23 LAB — POCT INR: INR: 2

## 2015-11-13 ENCOUNTER — Other Ambulatory Visit: Payer: Self-pay | Admitting: Cardiovascular Disease

## 2015-11-18 ENCOUNTER — Ambulatory Visit (INDEPENDENT_AMBULATORY_CARE_PROVIDER_SITE_OTHER): Payer: Medicare HMO | Admitting: *Deleted

## 2015-11-18 DIAGNOSIS — I2699 Other pulmonary embolism without acute cor pulmonale: Secondary | ICD-10-CM

## 2015-11-18 DIAGNOSIS — Z5181 Encounter for therapeutic drug level monitoring: Secondary | ICD-10-CM | POA: Diagnosis not present

## 2015-11-18 DIAGNOSIS — I82409 Acute embolism and thrombosis of unspecified deep veins of unspecified lower extremity: Secondary | ICD-10-CM | POA: Diagnosis not present

## 2015-11-18 LAB — POCT INR: INR: 2.2

## 2015-11-18 MED ORDER — WARFARIN SODIUM 5 MG PO TABS: ORAL_TABLET | ORAL | 1 refills | Status: DC

## 2016-04-26 ENCOUNTER — Other Ambulatory Visit: Payer: Self-pay | Admitting: Cardiovascular Disease

## 2016-06-03 ENCOUNTER — Ambulatory Visit: Payer: Medicare HMO | Admitting: Cardiovascular Disease

## 2016-06-08 ENCOUNTER — Ambulatory Visit (INDEPENDENT_AMBULATORY_CARE_PROVIDER_SITE_OTHER): Payer: Medicare HMO | Admitting: Cardiovascular Disease

## 2016-06-08 ENCOUNTER — Encounter: Payer: Self-pay | Admitting: Cardiovascular Disease

## 2016-06-08 ENCOUNTER — Ambulatory Visit: Payer: Self-pay | Admitting: Pharmacist

## 2016-06-08 VITALS — BP 162/80 | HR 73 | Ht 59.0 in | Wt 180.0 lb

## 2016-06-08 DIAGNOSIS — E782 Mixed hyperlipidemia: Secondary | ICD-10-CM | POA: Diagnosis not present

## 2016-06-08 DIAGNOSIS — I2782 Chronic pulmonary embolism: Secondary | ICD-10-CM | POA: Diagnosis not present

## 2016-06-08 DIAGNOSIS — Z5181 Encounter for therapeutic drug level monitoring: Secondary | ICD-10-CM

## 2016-06-08 DIAGNOSIS — I10 Essential (primary) hypertension: Secondary | ICD-10-CM | POA: Diagnosis not present

## 2016-06-08 MED ORDER — AMLODIPINE BESYLATE 5 MG PO TABS: 5.0000 mg | ORAL_TABLET | Freq: Every day | ORAL | 3 refills | Status: DC

## 2016-06-08 MED ORDER — RIVAROXABAN 20 MG PO TABS: 20.0000 mg | ORAL_TABLET | Freq: Every day | ORAL | 11 refills | Status: DC

## 2016-06-08 MED ORDER — CARVEDILOL 6.25 MG PO TABS: 6.2500 mg | ORAL_TABLET | Freq: Two times a day (BID) | ORAL | 3 refills | Status: DC

## 2016-06-08 MED ORDER — LOSARTAN POTASSIUM-HCTZ 100-25 MG PO TABS: 1.0000 | ORAL_TABLET | Freq: Every day | ORAL | 3 refills | Status: DC

## 2016-06-08 NOTE — Progress Notes (Signed)
Virginia Summers Date of Birth  [birth year]   [address]    [address]     [place], [place]  [address]      Problem list: 1. Hypothyroidism 2. Deep vein thrombosis 3. Pulmonary embolus 4. Hypertension 5. Chest pain - normal cath 2008  History of Present Illness:  She has continued to have severe DOE.  This has been present since her PE.  She has noticed loss of indigestion-like pain for the past several months.  This happens typically at night when she is lying down.  She has been trying to lose weight but has not been able to lose weight consistently.  Nov. 20, 2013 - [place] has been about the same. She continues to have some shortness breath. She fell this past summer and broke her left ankle in 2 spots.  She also has some episodes of lightheadedness or dizziness.  Some aspects of these symptoms sound like vertigo.   These symptoms are not related to orthostasis.    September 12, 2012:  Virginia Summers is very emotional today - her husband passed away several months ago.  He was in the hospital for lung problems and apparently developed c-diff colitis.    He has continued to have worsening dyspnea.   She has also had some unsteadyness when walking  Nov. 9, 2015:  She ran out of her BP meds and restarted it back a week She has been on Prednisone for 5-6 months and has gained ~ 30 lbs.  Feb. 2, 2017:  Virginia Summers is doing ok Has had some swelling with amlodipine 10 mg  - she cut the dose to 5 mg and is feeling better. Has occasional palpitations that sound like PVCs  Has occasional jaw pain. Stopped taking her coumadin  - didn't like the cost and trouble of getting levels checked  We discussed  DOACs  Exercising  - walks her dog 3 times a day ( [place] =- poodle mix )   June 08, 2016: Doing well .  Has lost 20 + lbs over the past several months  She ran out of her BP meds several weeks ago - BP is elevated.    Current Outpatient Prescriptions on File Prior to Visit  Medication Sig  Dispense Refill  . acetaminophen (TYLENOL) 500 MG tablet Take 1,000 mg by mouth every 6 (six) hours as needed. For pain.    Marland Kitchen albuterol (PROVENTIL HFA) 108 (90 BASE) MCG/ACT inhaler Inhale 2 puffs into the lungs every 4 (four) hours as needed. For shortness of breath.    Marland Kitchen amLODipine (NORVASC) 5 MG tablet TAKE ONE TABLET BY MOUTH ONCE DAILY. MUST HAVE OFFICE VISIT. 30 tablet 0  . carvedilol (COREG) 6.25 MG tablet TAKE ONE TABLET BY MOUTH TWICE DAILY WITH A MEAL. MUST HAVE OFFICE VISIT. 60 tablet 0  . cetirizine (ZYRTEC) 10 MG tablet Take 10 mg by mouth daily.    . citalopram (CELEXA) 20 MG tablet Take 1 tablet by mouth daily.    Marland Kitchen esomeprazole (NEXIUM) 20 MG capsule Take 20 mg by mouth daily at 12 noon.    Marland Kitchen levothyroxine (SYNTHROID, LEVOTHROID) 25 MCG tablet TAKE 1 TABLET (25 MCG TOTAL) BY MOUTH DAILY. 30 tablet 0  . losartan-hydrochlorothiazide (HYZAAR) 100-25 MG tablet Take 1 tablet by mouth daily. Reported on 04/16/2015 30 tablet 11  . metFORMIN (GLUCOPHAGE) 1000 MG tablet Take 1 tablet by mouth 2 (two) times daily.    Marland Kitchen warfarin (COUMADIN) 5 MG tablet TAKE  AS DIRECTED BY  ANTICOAGULATION  CLINIC (Patient not taking: Reported on 06/08/2016) 40 tablet 1   No current facility-administered medications on file prior to visit.     Allergies  Allergen Reactions  . Penicillins Swelling and Rash  . Sulfa Drugs Cross Reactors Swelling and Rash    Past Medical History:  Diagnosis Date  . Anticoagulant long-term use   . Chest pain   . Diabetes mellitus   . Diverticulitis   . DVT (deep venous thrombosis) (HCC)    BILATERAL, March 2012  . GERD (gastroesophageal reflux disease)   . HTN (hypertension)   . Hypothyroidism   . Pulmonary embolism [facility](HCC)     Past Surgical History:  Procedure Laterality Date  . APPENDECTOMY  AGE 76  . BACK SURGERY  2000   DISC  . PARTIAL HYSTERECTOMY  AGE 76's APPROX APPROX.  Marland Kitchen. SECONDARY INTRAOCULAR LENSE IMPLANTATION  2005,2006   OU  . SHOULDER SURGERY  5-6 YRS OLD    PINS    History  Smoking Status  . Former Smoker  . Packs/day: 0.50  . Years: 30.00  . Types: Cigarettes  . Quit date: 03/15/2003  Smokeless Tobacco  . Never Used    History  Alcohol Use No    Family History  Problem Relation Age of Onset  . Transient ischemic attack Father   . Coronary artery disease Father   . Stroke Mother   . Diabetes Mother   . Diabetes Sister   . Hypertension Daughter     Reviw of Systems:  Reviewed in the HPI.  All other systems are negative.  Physical Exam: Blood pressure (!) 162/80, pulse 73, height 4\' 11"  (1.499 m), weight 180 lb (81.6 kg). Body mass index is 36.36 kg/m.  General: Well developed, well nourished, in no acute distress.  Head: Normocephalic, atraumatic, sclera non-icteric, mucus membranes are moist,   Neck: Supple. Carotids are 2 + without bruits. No JVD  Lungs: Clear bilaterally to auscultation.    Heart: regular rate  With normal  S1 S2. No murmurs, gallops or rubs.  Abdomen: Soft, non-tender, non-distended with normal bowel sounds. No hepatomegaly. No rebound/guarding. No masses.  Msk:  Strength and tone are normal  Extremities: No clubbing or cyanosis. No edema.  Distal pedal pulses are 2+ and equal bilaterally.  Neuro: Alert and oriented X 3. Moves all extremities spontaneously.  Psych:  Responds to questions appropriately with a normal affect.  ECG: June 08, 2016: NSR at 5873.      Assessment / Plan:   1. Hypothyroidism - further management her internal medicine doctor.  2. Deep vein thrombosis :   she stopped her Coumadin (again)  because she did not coming over for the INR checks. I've given her information regarding the DOACS.    Will start Xarelto 20 mg a day   3. Pulmonary embolus -  Needs to be on anticoagulation    She had an unprovoked Pulmonary embolus .   She keeps stopping her coumadin because she does not like to come have her levels checked.   will start her on Xarelto 20 mg a day .   4.  Hypertension-  She ran out of her antihypertensive medicines several weeks ago. We'll check labs today including basic medical profile, CBC, fasting lipids, and liver enzymes. I've refilled her losartan-HCT and her carvedilol. Continue current medications.  5. Chest pain - normal cath 2008  6. Vertigo / seems to be off balance when she is walking Going to  ENT next week.     Kristeen Miss, MD  06/08/2016 11:55 AM    [facility] [address] [place],  [address] [place], [place]  [address] Pager [phone] Phone: [phone]; Fax: [phone]

## 2016-06-08 NOTE — Patient Instructions (Signed)
Medication Instructions:  STOP Coumadin (patient has run out > 1 month ago) START Xarelto 20 mg once daily at dinnertime   Labwork: TODAY - cholesterol, complete metabolic panel, CBC  Your physician recommends that you return for lab work in: 1 month at CVRR visit for CBC, BMET   Testing/Procedures: None Ordered   Follow-Up: Your physician recommends that you schedule a follow-up appointment in: 1 month with CVRR for new Xarelto start  Your physician wants you to follow-up in: 6 months with Dr. Elease HashimotoNahser.  You will receive a reminder letter in the mail two months in advance. If you don't receive a letter, please call our office to schedule the follow-up appointment.   If you need a refill on your cardiac medications before your next appointment, please call your pharmacy.   Thank you for choosing [facility]! Eligha BridegroomMichelle Jleigh Striplin, RN [phone]

## 2016-06-09 ENCOUNTER — Telehealth: Payer: Self-pay | Admitting: Cardiovascular Disease

## 2016-06-09 LAB — LIPID PANEL
CHOL/HDL RATIO: 3.1 ratio (ref 0.0–4.4)
Cholesterol, Total: 154 mg/dL (ref 100–199)
HDL: 49 mg/dL (ref >39)
LDL Calculated: 69 mg/dL (ref 0–99)
TRIGLYCERIDES: 181 mg/dL — AB (ref 0–149)
VLDL CHOLESTEROL CAL: 36 mg/dL (ref 5–40)

## 2016-06-09 LAB — CBC
Hematocrit: 38.5 % (ref 34.0–46.6)
Hemoglobin: 12.5 g/dL (ref 11.1–15.9)
MCH: 27.2 pg (ref 26.6–33.0)
MCHC: 32.5 g/dL (ref 31.5–35.7)
MCV: 84 fL (ref 79–97)
Platelets: 331 x10E3/uL (ref 150–379)
RBC: 4.59 x10E6/uL (ref 3.77–5.28)
RDW: 14.8 % (ref 12.3–15.4)
WBC: 12.1 x10E3/uL — ABNORMAL HIGH (ref 3.4–10.8)

## 2016-06-09 LAB — COMPREHENSIVE METABOLIC PANEL WITH GFR
ALT: 9 IU/L (ref 0–32)
AST: 12 IU/L (ref 0–40)
Albumin/Globulin Ratio: 1.4 (ref 1.2–2.2)
Albumin: 4.2 g/dL (ref 3.5–4.8)
Alkaline Phosphatase: 61 IU/L (ref 39–117)
BUN/Creatinine Ratio: 15 (ref 12–28)
BUN: 17 mg/dL (ref 8–27)
Bilirubin Total: 0.3 mg/dL (ref 0.0–1.2)
CO2: 25 mmol/L (ref 18–29)
Calcium: 9.3 mg/dL (ref 8.7–10.3)
Chloride: 99 mmol/L (ref 96–106)
Creatinine, Ser: 1.12 mg/dL — ABNORMAL HIGH (ref 0.57–1.00)
GFR calc Af Amer: 55 mL/min/1.73 — ABNORMAL LOW
GFR calc non Af Amer: 48 mL/min/1.73 — ABNORMAL LOW
Globulin, Total: 3.1 g/dL (ref 1.5–4.5)
Glucose: 86 mg/dL (ref 65–99)
Potassium: 4.5 mmol/L (ref 3.5–5.2)
Sodium: 140 mmol/L (ref 134–144)
Total Protein: 7.3 g/dL (ref 6.0–8.5)

## 2016-06-09 NOTE — Telephone Encounter (Signed)
Spoke with patient and advised her that I will work on finding out which of the NOACs will be most affordable for her. She states her out of pocket will be $200 per month for Xarelto and she cannot afford that. She quit taking coumadin prior to this and does not want to go back on it. I advised her to continue Xarelto with the samples and the free 30 day supply we gave her yesterday and I will call her next week to discuss options. She verbalized understanding and agreement with plan and thanked me for the call.

## 2016-06-09 NOTE — Telephone Encounter (Signed)
New message      Pt c/o medication issue:  1. Name of Medication:  xarelto 2. How are you currently taking this medication (dosage and times per day)? 20mg  daily 3. Are you having a reaction (difficulty breathing--STAT)? no 4. What is your medication issue? Pt cannot afford xarelto.  It is $200 a month with ins.  Is there something else she can take that is cheaper?

## 2016-06-16 NOTE — Telephone Encounter (Signed)
Spoke with [employer] who advised me on out of pocket cost of Xarelto for patient. She advised patient's cost will be approximately $40/month after the first month of $186 due to not meeting deductible. She advised that eliquis would cost the same.  I advised patient of this information. She states she likes taking the 1 pill daily and thinks she can manage the $40 per month. She thanked me for my help and will call back with questions or concerns.

## 2016-07-04 ENCOUNTER — Ambulatory Visit (INDEPENDENT_AMBULATORY_CARE_PROVIDER_SITE_OTHER): Payer: Medicare HMO | Admitting: *Deleted

## 2016-07-04 ENCOUNTER — Other Ambulatory Visit: Payer: Medicare HMO

## 2016-07-04 DIAGNOSIS — I2782 Chronic pulmonary embolism: Secondary | ICD-10-CM | POA: Diagnosis not present

## 2016-07-04 DIAGNOSIS — I2699 Other pulmonary embolism without acute cor pulmonale: Secondary | ICD-10-CM

## 2016-07-04 DIAGNOSIS — E782 Mixed hyperlipidemia: Secondary | ICD-10-CM | POA: Diagnosis not present

## 2016-07-04 LAB — CBC
HEMOGLOBIN: 11.5 g/dL (ref 11.1–15.9)
Hematocrit: 35.5 % (ref 34.0–46.6)
MCH: 27.4 pg (ref 26.6–33.0)
MCHC: 32.4 g/dL (ref 31.5–35.7)
MCV: 85 fL (ref 79–97)
PLATELETS: 251 10*3/uL (ref 150–379)
RBC: 4.2 x10E6/uL (ref 3.77–5.28)
RDW: 14.9 % (ref 12.3–15.4)
WBC: 7.9 10*3/uL (ref 3.4–10.8)

## 2016-07-04 LAB — BASIC METABOLIC PANEL
BUN/Creatinine Ratio: 20 (ref 12–28)
BUN: 19 mg/dL (ref 8–27)
CALCIUM: 8.7 mg/dL (ref 8.7–10.3)
CO2: 26 mmol/L (ref 18–29)
Chloride: 102 mmol/L (ref 96–106)
Creatinine, Ser: 0.95 mg/dL (ref 0.57–1.00)
GFR calc Af Amer: 67 mL/min/{1.73_m2} (ref >59)
GFR, EST NON AFRICAN AMERICAN: 58 mL/min/{1.73_m2} — AB (ref >59)
GLUCOSE: 106 mg/dL — AB (ref 65–99)
POTASSIUM: 4.5 mmol/L (ref 3.5–5.2)
SODIUM: 141 mmol/L (ref 134–144)

## 2016-07-04 NOTE — Progress Notes (Signed)
Pt was switched from Coumadin to Xarelto. Started on Xarelto  for PE on 06/03/16.  Pt. States she is having a nasal/sinus procedure on 07/23/16 with Dr Richardson Landry ENT at [facility]. 07/04/16  telephoned Dr Kathi Der office, staff discussed Xarelto with Dr Christell Constant and upcoming surgery. They stated its a procedure on her soft tissue turbinates and she, per Dr Christell Constant does not need to hold her Xarelto.   Reviewed patients medication list.  Pt is not  currently on any combined P-gp and strong CYP3A4 inhibitors/inducers (ketoconazole, traconazole, ritonavir, carbamazepine, phenytoin, rifampin, St. John's wort).  Reviewed labs.  SCr 0.95, Weight 83.5 Kg, CrCl- 66.75mL/min.  Dose of  of Xarelto appropriate based on CrCl.   Hgb and HCT 11.5/35.5.  A full discussion of the nature of anticoagulants has been carried out.  A benefit/risk analysis has been presented to the patient, so that they understand the justification for choosing anticoagulation with Xarelto at this time.  The need for compliance is stressed.  Pt is aware to take the medication once daily with the largest meal of the day.  Side effects of potential bleeding are discussed, including unusual colored urine or stools, coughing up blood or coffee ground emesis, nose bleeds or serious fall or head trauma.  Discussed signs and symptoms of stroke. The patient should avoid any OTC items containing aspirin or ibuprofen.  Avoid alcohol consumption.   Call if any signs of abnormal bleeding.  Discussed financial obligations and resolved any difficulty in obtaining medication. 07/05/16 spoke with pt and Next lab test in 6 months scheduled, lab results given.

## 2017-01-18 ENCOUNTER — Ambulatory Visit (INDEPENDENT_AMBULATORY_CARE_PROVIDER_SITE_OTHER): Payer: Medicare HMO

## 2017-01-18 ENCOUNTER — Telehealth: Payer: Self-pay

## 2017-01-18 DIAGNOSIS — I2699 Other pulmonary embolism without acute cor pulmonale: Secondary | ICD-10-CM | POA: Diagnosis not present

## 2017-01-18 DIAGNOSIS — Z5181 Encounter for therapeutic drug level monitoring: Secondary | ICD-10-CM | POA: Diagnosis not present

## 2017-01-18 NOTE — Telephone Encounter (Signed)
**Note De-Identified Caleb Prigmore Obfuscation** The pt was in the office today for an INR check While here she stated that she spoke with someone in this office who told we would leave her some Xarelto samples in the front office that she can pick up today. There is no notes in her chart concerning her need for samples of Xarelto.  I called the pts pharmacy, [facility], and was advised that the pt just picked up her Xarelto refill and that a 30 day supply is costing her $42 a month.  The pt is advised that if she cannot afford Xarelto she can apply for pt assistance but approval in unlikely as she does has insurance and is not in the donut hole or we can talk about switching to a less expensive medication with Dr Elease HashimotoNahser.  She states that she will let us know if she decides to apply for pt assistance or if she wants to switch to a less expensive medication.

## 2017-01-18 NOTE — Patient Instructions (Signed)
A full discussion of the nature of anticoagulants has been carried out.  A benefit/risk analysis has been presented to the patient, so that they understand the justification for choosing anticoagulation with Xarelto at this time.  The need for compliance is stressed.  Pt is aware to take the medication once daily with the largest meal of the day.  Side effects of potential bleeding are discussed, including unusual colored urine or stools, coughing up blood or coffee ground emesis, nose bleeds or serious fall or head trauma.  Discussed signs and symptoms of stroke. The patient should avoid any OTC items containing aspirin or ibuprofen.  Avoid alcohol consumption.   Call if any signs of abnormal bleeding.  Discussed financial obligations and resolved any difficulty in obtaining medication.  Next lab test in 6 months.   

## 2017-01-18 NOTE — Progress Notes (Signed)
Pt was started on Xarelto 20mg  QD for PE on 06/03/16.    Reviewed patients medication list.  Pt is not currently on any combined P-gp and strong CYP3A4 inhibitors/inducers (ketoconazole, traconazole, ritonavir, carbamazepine, phenytoin, rifampin, St. John's wort).  Reviewed labs.  SCr 1.04, Weight 82.6kg, CrCl- 59.07.  Dose appropriate based on CrCl.   Hgb and HCT 11.3/35.2.   A full discussion of the nature of anticoagulants has been carried out.  A benefit/risk analysis has been presented to the patient, so that they understand the justification for choosing anticoagulation with Xarelto at this time.  The need for compliance is stressed.  Pt is aware to take the medication once daily with the largest meal of the day.  Side effects of potential bleeding are discussed, including unusual colored urine or stools, coughing up blood or coffee ground emesis, nose bleeds or serious fall or head trauma.  Discussed signs and symptoms of stroke. The patient should avoid any OTC items containing aspirin or ibuprofen.  Avoid alcohol consumption.   Call if any signs of abnormal bleeding.  Discussed financial obligations and resolved any difficulty in obtaining medication.  Next lab test in 6 months.

## 2017-01-19 LAB — BASIC METABOLIC PANEL WITH GFR
BUN/Creatinine Ratio: 23 (ref 12–28)
BUN: 24 mg/dL (ref 8–27)
CO2: 26 mmol/L (ref 20–29)
Calcium: 9.7 mg/dL (ref 8.7–10.3)
Chloride: 100 mmol/L (ref 96–106)
Creatinine, Ser: 1.04 mg/dL — ABNORMAL HIGH (ref 0.57–1.00)
GFR calc Af Amer: 60 mL/min/{1.73_m2}
GFR calc non Af Amer: 52 mL/min/{1.73_m2} — ABNORMAL LOW
Glucose: 126 mg/dL — ABNORMAL HIGH (ref 65–99)
Potassium: 4 mmol/L (ref 3.5–5.2)
Sodium: 140 mmol/L (ref 134–144)

## 2017-01-19 LAB — CBC
Hematocrit: 35.2 % (ref 34.0–46.6)
Hemoglobin: 11.3 g/dL (ref 11.1–15.9)
MCH: 26 pg — ABNORMAL LOW (ref 26.6–33.0)
MCHC: 32.1 g/dL (ref 31.5–35.7)
MCV: 81 fL (ref 79–97)
Platelets: 324 10*3/uL (ref 150–379)
RBC: 4.34 x10E6/uL (ref 3.77–5.28)
RDW: 14.6 % (ref 12.3–15.4)
WBC: 6.9 10*3/uL (ref 3.4–10.8)

## 2017-06-15 ENCOUNTER — Other Ambulatory Visit: Payer: Self-pay | Admitting: Cardiovascular Disease

## 2017-06-15 NOTE — Telephone Encounter (Signed)
Xarelto 20mg  refill request received; pt is [age]yrs old, wt-81.6kg, Crea-1.04 on 01/18/17, last seen by Dr. Elease HashimotoNahser on 06/08/16 & is due for follow up, also, pt is due to see the Anticoagulation Clinic in May 2019, CrCl-58.1936ml/min. Will send in refill to requested pharmacy.

## 2017-08-01 ENCOUNTER — Other Ambulatory Visit: Payer: Self-pay | Admitting: Cardiovascular Disease

## 2017-08-24 ENCOUNTER — Other Ambulatory Visit: Payer: Self-pay | Admitting: Cardiovascular Disease

## 2017-09-05 ENCOUNTER — Telehealth: Payer: Self-pay | Admitting: Cardiovascular Disease

## 2017-09-05 NOTE — Telephone Encounter (Signed)
Spoke with patient who states she was paying $47 for Xarelto previously and now her co-pay is $141.08. She states she currently has 2 pills at home with her. She has Medicare and [employer] insurance. I asked her if she can come to the office to pick up samples as we work on getting affordable medication and she agrees. I advised I will forward message to our Patient Care advocates for assistance with cost. She verbalized understanding.   States she would also like to discuss bilateral foot swelling. States she has lost 14 lbs in last 1 1/2 months since starting Trulicity and that is the only new medication. I asked about diet and she admits to eating more salt recently. I encouraged her to elevate her legs above level of heart when sitting. She states in the past she held her amlodipine and noticed improvement in leg swelling. She states she has been monitoring BP at home but with a wrist cuff. I advised her to d/c Amlodipine and encouraged her to get an arm cuff and continue to monitor BP. She verbalized understanding and agreement and states she has an upcoming appointment with PCP. She will call back if BP is elevated and I scheduled her to see Dr. Elease HashimotoNahser on 7/29 since she is overdue.

## 2017-09-05 NOTE — Telephone Encounter (Signed)
New message    Pt said she needs to talk to someone about her medication. She said she can not afford her medication any longer. She went to pick it up and her part was 141 dollars. Please call.

## 2017-09-06 ENCOUNTER — Telehealth: Payer: Self-pay

## 2017-09-06 NOTE — Telephone Encounter (Signed)
See phone note from 09/06/17 (Medication management for Xarelto).

## 2017-09-06 NOTE — Telephone Encounter (Addendum)
See phone note from 09/05/17 with Dr Harvie BridgeNahser's nurse, Marcelino DusterMichelle.  I have done a Xarelto tier exception through covermymeds in hopes of lowering the cost for the pt.  Key: [id number]

## 2017-09-07 NOTE — Telephone Encounter (Signed)
Received fax from [employer] regarding tier exception for XARELTO, it was denied. "xarelto is a brand name medication, it is already on the lowest possible copay level for a brand drug"  Supported by [employer] guidelines.  Spoke with patient at length about this issue, she states she is in the donut hole and her copay for the xarelto is $142.00 month, she states she cant afford this. She states she has run out of her xarelto TODAY, she cannot pick up samples until tomorrow. We also talked about patient assistance for this medication, I will provide her the [employer] forms. She states if she has to go back on coumadin she would consider this.   Will route to Dr Elease HashimotoNahser and his nurse.  I have placed provider form in Dr Namon CirriNahsers box.

## 2017-09-07 NOTE — Telephone Encounter (Signed)
She may take Xarelto, Eliquis , or coumadin. I do not have an opinion in which she chooses. Dosing per coumadin clinic

## 2017-09-08 NOTE — Telephone Encounter (Signed)
Recommend having pt pick up Xarelto samples and seeing if she qualifies for patient assistance through the donut hole through [employer]. If not, would need to switch pt to Coumadin.

## 2017-09-12 ENCOUNTER — Other Ambulatory Visit: Payer: Self-pay | Admitting: Cardiovascular Disease

## 2017-09-12 NOTE — Telephone Encounter (Signed)
Spoke with Virginia Summers and pt is in process of application. She did get samples. Virginia Summers will let us know if denied and needs to be transitioned to warfarin.

## 2017-09-27 NOTE — Telephone Encounter (Addendum)
**Note De-Identified Tuwana Kapaun Obfuscation** I s/w the pt concerning the Xarelto samples that she has not picked up yet or the [employer] Pt Asst application.  She states that the reason she has not picked up her samples of Xarelto or the Pt Asst application is because she has been having "medical issues". She also states that her PCP told her on 7/16 (Monday) to call and get a sooner appt with Dr Elease HashimotoNahser but she did not call. I stressed the importance of her taking Xarelto or one of the other anticoagulants that was offered to her as directed.  I have mailed the pt a [employer] pt asst application to fill out and return to us and have left the Xarelto samples in the front office for her to pick up. She states that it is difficult for her to get to the office but she will try to pick up the Xarelto samples tomorrow.  After we finished our conversation I sent her call to scheduling to see if she can be seen sooner.  I am forwarding this call to our pharmacist Nicholaus BloomKelley and Aundra MilletMegan as an update.

## 2017-10-09 ENCOUNTER — Encounter: Payer: Self-pay | Admitting: Cardiovascular Disease

## 2017-10-09 ENCOUNTER — Ambulatory Visit: Payer: Medicare HMO | Admitting: Cardiovascular Disease

## 2017-10-09 VITALS — BP 144/80 | HR 82 | Ht 59.0 in | Wt 184.4 lb

## 2017-10-09 DIAGNOSIS — R55 Syncope and collapse: Secondary | ICD-10-CM | POA: Diagnosis not present

## 2017-10-09 DIAGNOSIS — I10 Essential (primary) hypertension: Secondary | ICD-10-CM

## 2017-10-09 MED ORDER — WARFARIN SODIUM 5 MG PO TABS: 5.0000 mg | ORAL_TABLET | Freq: Every day | ORAL | 0 refills | Status: DC

## 2017-10-09 NOTE — Patient Instructions (Signed)
Medication Instructions:  Your physician has recommended you make the following change in your medication:   START Warfarin (Coumadin) 5 mg once daily   Labwork: None Ordered   Testing/Procedures: Your physician has requested that you have a carotid duplex. This test is an ultrasound of the carotid arteries in your neck. It looks at blood flow through these arteries that supply the brain with blood. Allow one hour for this exam. There are no restrictions or special instructions.  Your physician has requested that you have an echocardiogram. Echocardiography is a painless test that uses sound waves to create images of your heart. It provides your doctor with information about the size and shape of your heart and how well your heart's chambers and valves are working. This procedure takes approximately one hour. There are no restrictions for this procedure.    Follow-Up: Your physician recommends that you schedule a follow-up appointment in: 1 week with Coumadin Clinic    Your physician recommends that you schedule a follow-up appointment in: 3 months with Dr. Elease HashimotoNahser   If you need a refill on your cardiac medications before your next appointment, please call your pharmacy.   Thank you for choosing [facility]! Eligha BridegroomMichelle Bassheva Flury, RN [phone]

## 2017-10-09 NOTE — Progress Notes (Signed)
Virginia Summers Date of Birth  1940/04/28   [address]    [address]     [place], [place]  [address]      Problem list: 1. Hypothyroidism 2. Deep vein thrombosis 3. Pulmonary embolus 4. Hypertension 5. 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  This has been present since her PE.  She has noticed loss of indigestion-like pain for the past several months.  This happens typically at night when she is lying down.  She has been trying to lose weight but has not been able to lose weight consistently.  Nov. 20, 2013 - Virginia Summers has been about the same. She continues to have some shortness breath. She fell this past summer and broke her left ankle in 2 spots.  She also has some episodes of lightheadedness or dizziness.  Some aspects of these symptoms sound like vertigo.   These symptoms are not related to orthostasis.    September 12, 2012:  Virginia Summers is very emotional today - her husband passed away several months ago.  He was in the hospital for lung problems and apparently developed c-diff colitis.    He has continued to have worsening dyspnea.   She has also had some unsteadyness when walking  Nov. 9, 2015:  She ran out of her BP meds and restarted it back a week She has been on Prednisone for 5-6 months and has gained ~ 30 lbs.  Feb. 2, 2017:  Virginia Summers is doing ok Has had some swelling with amlodipine 10 mg  - she cut the dose to 5 mg and is feeling better. Has occasional palpitations that sound like PVCs  Has occasional jaw pain. Stopped taking her coumadin  - didn't like the cost and trouble of getting levels checked  We discussed  DOACs  Exercising  - walks her dog 3 times a day ( [place] =- poodle mix )   June 08, 2016: Doing well .  Has lost 20 + lbs over the past several months  She ran out of her BP meds several weeks ago - BP is elevated.   October 09, 2017: Virginia Summers is seen today for follow-up visit. Has had several syncopal episodes since I last saw  her  Is having palpitations.  Does not exercise  Syncopal episodes.   Is out for a few seconds.  Has a 14 day event monitor on currently .   Feels like her heart races and then she gets lighthead.  Not related to exercise,  Not related to eating or   Also has some vertigo symptoms and symptoms or orthostasis .   No CP ,     Hx of of DVT and PE.    Was on Xarelto but she stopped due to cost.    Current Outpatient Medications on File Prior to Visit  Medication Sig Dispense Refill  . acetaminophen (TYLENOL) 500 MG tablet Take 1,000 mg by mouth every 6 (six) hours as needed. For pain.    Marland Kitchen albuterol (PROVENTIL HFA) 108 (90 BASE) MCG/ACT inhaler Inhale 2 puffs into the lungs every 4 (four) hours as needed. For shortness of breath.    Marland Kitchen atorvastatin (LIPITOR) 20 MG tablet Take 20 mg by mouth daily.    . carvedilol (COREG) 6.25 MG tablet TAKE 1 TABLET BY MOUTH TWICE DAILY WITH A MEAL Please keep upcoming appointment for further refills 60 tablet 0  . citalopram (CELEXA) 20 MG tablet  Take 1 tablet by mouth daily.    Marland Kitchen levothyroxine (SYNTHROID, LEVOTHROID) 25 MCG tablet TAKE 1 TABLET (25 MCG TOTAL) BY MOUTH DAILY. 30 tablet 0  . losartan-hydrochlorothiazide (HYZAAR) 100-25 MG tablet Take 1 tablet by mouth daily. Reported on 04/16/2015 90 tablet 3  . metFORMIN (GLUCOPHAGE) 1000 MG tablet Take 1 tablet by mouth 2 (two) times daily.    . traZODone (DESYREL) 50 MG tablet TAKE 1 TABLET BY MOUTH NIGHTLY FOR SLEEP  5  . XARELTO 20 MG TABS tablet TAKE 1 TABLET BY MOUTH ONCE DAILY WITH SUPPER 30 tablet 11   No current facility-administered medications on file prior to visit.     Allergies  Allergen Reactions  . Penicillins Swelling and Rash  . Sulfa Drugs Cross Reactors Swelling and Rash    Past Medical History:  Diagnosis Date  . Anticoagulant long-term use   . Chest pain   . Diabetes mellitus   . Diverticulitis   . DVT (deep venous thrombosis) (HCC)    BILATERAL, March 2012  . GERD  (gastroesophageal reflux disease)   . HTN (hypertension)   . Hypothyroidism   . Pulmonary embolism [facility])     Past Surgical History:  Procedure Laterality Date  . APPENDECTOMY  AGE 31  . BACK SURGERY  2000   DISC  . PARTIAL HYSTERECTOMY  AGE 5's APPROX.  Marland Kitchen SECONDARY INTRAOCULAR LENSE IMPLANTATION  2005,2006   OU  . SHOULDER SURGERY  5-6 YRS OLD   PINS    Social History   Tobacco Use  Smoking Status Former Smoker  . Packs/day: 0.50  . Years: 30.00  . Pack years: 15.00  . Types: Cigarettes  . Last attempt to quit: 03/15/2003  . Years since quitting: 14.5  Smokeless Tobacco Never Used    Social History   Substance and Sexual Activity  Alcohol Use No    Family History  Problem Relation Age of Onset  . Transient ischemic attack Father   . Coronary artery disease Father   . Stroke Mother   . Diabetes Mother   . Diabetes Sister   . Hypertension Daughter     Reviw of Systems:  Noted in current history, otherwise review of systems is negative.  Physical Exam: Blood pressure (!) 144/80, pulse 82, height 4\' 11"  (1.499 m), weight 184 lb 6.4 oz (83.6 kg), SpO2 94 %.  GEN:  Elderly female,   Moderately obese.   NAD  HEENT: Normal NECK: No JVD; No carotid bruits LYMPHATICS: No lymphadenopathy CARDIAC: RR,   RESPIRATORY:  Clear to auscultation without rales, wheezing or rhonchi  ABDOMEN: Soft, non-tender, non-distended MUSCULOSKELETAL:  No edema; No deformity  SKIN: Warm and dry NEUROLOGIC:  Alert and oriented x 3   ECG: October 09, 2017: Normal sinus rhythm.  Left bundle branch block.   Assessment / Plan:   1.  Syncope: Virginia Summers presents with several episodes of syncope.  These seem to be preceded by her rapid heart rate.  She has an event monitor on. Will get an echo and carotid duplex   2. Deep vein thrombosis :    She stopped Xarelto because she could not afford it.  We will restart her Coumadin.  We will start 5 mg a day and she will see the Coumadin clinic in  approximately 1 week.  3. Pulmonary embolus -     We will be restarting Coumadin.  Further management per the Coumadin clinic.  4. Hypertension-    BP is fairly well controlled.  6. Vertigo    Kristeen MissPhilip Shelbie Franken, MD  10/09/2017 12:18 PM    [facility] [address],  [address] [place], [place]  [address] Pager [phone]- [phone] Phone: [phone]; Fax: [phone]

## 2017-10-19 ENCOUNTER — Ambulatory Visit: Payer: Medicare HMO | Admitting: *Deleted

## 2017-10-19 ENCOUNTER — Other Ambulatory Visit: Payer: Self-pay

## 2017-10-19 ENCOUNTER — Ambulatory Visit (HOSPITAL_BASED_OUTPATIENT_CLINIC_OR_DEPARTMENT_OTHER): Payer: Medicare HMO

## 2017-10-19 ENCOUNTER — Ambulatory Visit (HOSPITAL_COMMUNITY)
Admission: RE | Admit: 2017-10-19 | Discharge: 2017-10-19 | Disposition: A | Discharge status: Home / Self Care | Payer: Medicare HMO | Source: Ambulatory Visit | Attending: Cardiology | Admitting: Cardiology

## 2017-10-19 DIAGNOSIS — I2699 Other pulmonary embolism without acute cor pulmonale: Secondary | ICD-10-CM | POA: Diagnosis not present

## 2017-10-19 DIAGNOSIS — Z5181 Encounter for therapeutic drug level monitoring: Secondary | ICD-10-CM

## 2017-10-19 DIAGNOSIS — R55 Syncope and collapse: Secondary | ICD-10-CM | POA: Insufficient documentation

## 2017-10-19 LAB — POCT INR: INR: 1.9 — AB (ref 2.0–3.0)

## 2017-10-19 MED ORDER — WARFARIN SODIUM 5 MG PO TABS: 5.0000 mg | ORAL_TABLET | ORAL | 0 refills | Status: DC

## 2017-10-19 NOTE — Patient Instructions (Signed)
Description   Start taking 1 tablet daily except 1.5 tablets on Sundays and Thursdays. Recheck in 2 weeks. Call us with any medication changes or concerns # [phone] [facility]. Main # [phone].

## 2017-10-23 ENCOUNTER — Telehealth: Payer: Self-pay | Admitting: Cardiovascular Disease

## 2017-10-23 NOTE — Telephone Encounter (Signed)
Reviewed results of carotid duplex and echo with patient who verbalized understanding. She asks if the results of the carotid duplex would show cause for headache. She states she wakes up with a headache every morning. I asked about hydration and she admits to not drinking enough water through the day. She states she does not know if she snores or has sleep disturbances. I advised her to f/u with her PCP regarding her headaches. She is aware to call back with questions or concerns prior to her appointment with Dr. Elease HashimotoNahser in October. She thanked me for the call.

## 2017-10-23 NOTE — Telephone Encounter (Signed)
New Message   Pt returning call for nurse regarding echo results

## 2017-11-26 ENCOUNTER — Other Ambulatory Visit: Payer: Self-pay | Admitting: Cardiovascular Disease

## 2017-12-07 ENCOUNTER — Ambulatory Visit (INDEPENDENT_AMBULATORY_CARE_PROVIDER_SITE_OTHER): Payer: Medicare HMO | Admitting: *Deleted

## 2017-12-07 DIAGNOSIS — I2699 Other pulmonary embolism without acute cor pulmonale: Secondary | ICD-10-CM

## 2017-12-07 DIAGNOSIS — Z5181 Encounter for therapeutic drug level monitoring: Secondary | ICD-10-CM

## 2017-12-07 LAB — POCT INR: INR: 1 — AB (ref 2.0–3.0)

## 2017-12-07 MED ORDER — WARFARIN SODIUM 5 MG PO TABS: 5.0000 mg | ORAL_TABLET | ORAL | 0 refills | Status: DC

## 2017-12-07 NOTE — Patient Instructions (Signed)
Description   Tonight take 2 tablets, tomorrow take 1.5 tablets, then resume taking 1 tablet daily except 1.5 tablets on Sundays and Thursdays.  Recheck in 1 week. Call us with any medication changes or concerns # [phone] Cou[facility]in Clinic. Main # [phone].

## 2018-01-11 ENCOUNTER — Ambulatory Visit: Payer: Medicare HMO | Admitting: Cardiovascular Disease

## 2018-01-15 ENCOUNTER — Ambulatory Visit: Payer: Medicare HMO | Admitting: Cardiovascular Disease

## 2018-04-06 ENCOUNTER — Telehealth: Payer: Self-pay

## 2018-04-06 NOTE — Telephone Encounter (Signed)
Called pt left msg overdue inr 

## 2018-04-12 ENCOUNTER — Telehealth: Payer: Self-pay

## 2018-04-12 NOTE — Telephone Encounter (Signed)
Left msg overdue inr 

## 2018-04-27 ENCOUNTER — Telehealth: Payer: Self-pay

## 2018-04-27 NOTE — Telephone Encounter (Signed)
3rd try calling overdue coumadin left message

## 2019-05-09 ENCOUNTER — Telehealth: Payer: Self-pay | Admitting: Cardiovascular Disease

## 2019-05-09 MED ORDER — CHLORTHALIDONE 25 MG PO TABS: 12.5000 mg | ORAL_TABLET | Freq: Every day | ORAL | 11 refills | Status: DC

## 2019-05-09 MED ORDER — VALSARTAN 320 MG PO TABS: 320.0000 mg | ORAL_TABLET | Freq: Every day | ORAL | 11 refills | Status: DC

## 2019-05-09 MED ORDER — POTASSIUM CHLORIDE ER 10 MEQ PO TBCR: 10.0000 meq | EXTENDED_RELEASE_TABLET | Freq: Every day | ORAL | 11 refills | Status: DC

## 2019-05-09 NOTE — Telephone Encounter (Signed)
New message   Pt c/o BP issue: STAT if pt c/o blurred vision, one-sided weakness or slurred speech  1. What are your last 5 BP readings?217/80   2. Are you having any other symptoms (ex. Dizziness, headache, blurred vision, passed out)? Sob, blurred vision   3. What is your BP issue? Patient's b/p is elevated

## 2019-05-09 NOTE — Telephone Encounter (Signed)
Received call directly from operator from patient with elevated BP and blurred vision. She has not been seen by Dr. Elease Hashimoto since 2019. She states she has been confined to her home since the onset of Covid pandemic and has only seen PCP for her autoimmune disease. States BP has been elevated for a while and she does not agree with her the management offered by PCP.  Reports leg swelling and weight gain related to chronic steroid therapy for an autoimmune disease, states swelling is worse over the last week. Medications were reviewed and updated and she is working with doctors to wean off steroids. Patient took an additional 50 mg of losartan on her own advice which I advised her not to do again because max daily dose is 100 mg. I reviewed appropriate technique for checking BP and she states she is compliant and put new batteries in her wrist cuff last night.  Current BP readings are: Lt arm 190/83 mmHg, pulse 71 Rt arm 191/82 mmHg, pulse 70 She requests Dr. Harvie Bridge advice regarding her BP. I advised that she will need to follow-up with him in the office soon if he offers advice regarding her medications. She states she will comply with his advice and is aware I will call her back later. She thanked me for the call.

## 2019-05-09 NOTE — Telephone Encounter (Signed)
I spoke with Dr. Elease Hashimoto after he replied to this message and he agrees with plan to make one medication change now and schedule patient for a follow-up soon if patient prefers.  Left message for patient to call back for Dr. Harvie Bridge advice.

## 2019-05-09 NOTE — Telephone Encounter (Signed)
Reviewed Dr. Harvie Bridge advice with patient. She agrees to stop losartan tomorrow (Fri. 2/25) and start valsartan 320 mg. On Monday 3/1, she will stop HCTZ and start chlorthalidone 12.5 mg and kdur 10 mEq. She is scheduled to see Dr. Elease Hashimoto on 3/23 and is aware we will get lab work at that visit. I advised her to call back next week with questions or concerns. She verbalized understanding and agreement with plan and thanked me for the call.

## 2019-05-09 NOTE — Telephone Encounter (Signed)
I think that her blood pressure elevations are due to her weight gain and the steroid therapy.  The best answer is for her to work on diet, exercise, and weight loss.  Meanwhile lets discontinue losartan and try valsartan 320 mg a day.  Discontinue HCTZ and start chlorthalidone 25 mg a day.  Lets also add potassium chloride 10 mEq a day.  Check basic metabolic profile in 2 to 3 weeks.

## 2019-05-10 ENCOUNTER — Encounter (HOSPITAL_COMMUNITY): Payer: Self-pay | Admitting: Emergency Medicine

## 2019-05-10 ENCOUNTER — Inpatient Hospital Stay (HOSPITAL_COMMUNITY)
Admission: EM | Admit: 2019-05-10 | Discharge: 2019-05-14 | DRG: 190 | Disposition: A | Discharge status: Home Health | Payer: Medicare HMO | Attending: Family Medicine | Admitting: Family Medicine

## 2019-05-10 ENCOUNTER — Emergency Department (HOSPITAL_COMMUNITY): Payer: Medicare HMO

## 2019-05-10 ENCOUNTER — Other Ambulatory Visit: Payer: Self-pay

## 2019-05-10 ENCOUNTER — Inpatient Hospital Stay (HOSPITAL_COMMUNITY): Payer: Medicare HMO

## 2019-05-10 DIAGNOSIS — Z8249 Family history of ischemic heart disease and other diseases of the circulatory system: Secondary | ICD-10-CM

## 2019-05-10 DIAGNOSIS — I110 Hypertensive heart disease with heart failure: Secondary | ICD-10-CM | POA: Diagnosis present

## 2019-05-10 DIAGNOSIS — J449 Chronic obstructive pulmonary disease, unspecified: Secondary | ICD-10-CM | POA: Diagnosis present

## 2019-05-10 DIAGNOSIS — J209 Acute bronchitis, unspecified: Secondary | ICD-10-CM | POA: Diagnosis present

## 2019-05-10 DIAGNOSIS — I5032 Chronic diastolic (congestive) heart failure: Secondary | ICD-10-CM | POA: Diagnosis present

## 2019-05-10 DIAGNOSIS — Z9049 Acquired absence of other specified parts of digestive tract: Secondary | ICD-10-CM

## 2019-05-10 DIAGNOSIS — I10 Essential (primary) hypertension: Secondary | ICD-10-CM | POA: Diagnosis present

## 2019-05-10 DIAGNOSIS — I160 Hypertensive urgency: Secondary | ICD-10-CM | POA: Diagnosis present

## 2019-05-10 DIAGNOSIS — T380X5A Adverse effect of glucocorticoids and synthetic analogues, initial encounter: Secondary | ICD-10-CM | POA: Diagnosis present

## 2019-05-10 DIAGNOSIS — Z833 Family history of diabetes mellitus: Secondary | ICD-10-CM

## 2019-05-10 DIAGNOSIS — I272 Pulmonary hypertension, unspecified: Secondary | ICD-10-CM | POA: Diagnosis present

## 2019-05-10 DIAGNOSIS — R079 Chest pain, unspecified: Secondary | ICD-10-CM | POA: Diagnosis not present

## 2019-05-10 DIAGNOSIS — Z86718 Personal history of other venous thrombosis and embolism: Secondary | ICD-10-CM | POA: Diagnosis not present

## 2019-05-10 DIAGNOSIS — E1165 Type 2 diabetes mellitus with hyperglycemia: Secondary | ICD-10-CM | POA: Diagnosis present

## 2019-05-10 DIAGNOSIS — E669 Obesity, unspecified: Secondary | ICD-10-CM | POA: Diagnosis present

## 2019-05-10 DIAGNOSIS — E039 Hypothyroidism, unspecified: Secondary | ICD-10-CM | POA: Diagnosis present

## 2019-05-10 DIAGNOSIS — K219 Gastro-esophageal reflux disease without esophagitis: Secondary | ICD-10-CM | POA: Diagnosis present

## 2019-05-10 DIAGNOSIS — H532 Diplopia: Secondary | ICD-10-CM | POA: Diagnosis present

## 2019-05-10 DIAGNOSIS — F329 Major depressive disorder, single episode, unspecified: Secondary | ICD-10-CM | POA: Diagnosis present

## 2019-05-10 DIAGNOSIS — F419 Anxiety disorder, unspecified: Secondary | ICD-10-CM | POA: Diagnosis present

## 2019-05-10 DIAGNOSIS — Z86711 Personal history of pulmonary embolism: Secondary | ICD-10-CM

## 2019-05-10 DIAGNOSIS — Z7984 Long term (current) use of oral hypoglycemic drugs: Secondary | ICD-10-CM

## 2019-05-10 DIAGNOSIS — J440 Chronic obstructive pulmonary disease with acute lower respiratory infection: Secondary | ICD-10-CM | POA: Diagnosis present

## 2019-05-10 DIAGNOSIS — J9601 Acute respiratory failure with hypoxia: Secondary | ICD-10-CM

## 2019-05-10 DIAGNOSIS — Z880 Allergy status to penicillin: Secondary | ICD-10-CM

## 2019-05-10 DIAGNOSIS — Z7951 Long term (current) use of inhaled steroids: Secondary | ICD-10-CM

## 2019-05-10 DIAGNOSIS — Z90711 Acquired absence of uterus with remaining cervical stump: Secondary | ICD-10-CM | POA: Diagnosis not present

## 2019-05-10 DIAGNOSIS — Z79899 Other long term (current) drug therapy: Secondary | ICD-10-CM

## 2019-05-10 DIAGNOSIS — I081 Rheumatic disorders of both mitral and tricuspid valves: Secondary | ICD-10-CM | POA: Diagnosis present

## 2019-05-10 DIAGNOSIS — I447 Left bundle-branch block, unspecified: Secondary | ICD-10-CM | POA: Diagnosis present

## 2019-05-10 DIAGNOSIS — Z20822 Contact with and (suspected) exposure to covid-19: Secondary | ICD-10-CM | POA: Diagnosis present

## 2019-05-10 DIAGNOSIS — Z7952 Long term (current) use of systemic steroids: Secondary | ICD-10-CM

## 2019-05-10 DIAGNOSIS — Z79890 Hormone replacement therapy (postmenopausal): Secondary | ICD-10-CM

## 2019-05-10 DIAGNOSIS — R0602 Shortness of breath: Secondary | ICD-10-CM | POA: Diagnosis present

## 2019-05-10 DIAGNOSIS — J441 Chronic obstructive pulmonary disease with (acute) exacerbation: Principal | ICD-10-CM | POA: Diagnosis present

## 2019-05-10 DIAGNOSIS — Z87891 Personal history of nicotine dependence: Secondary | ICD-10-CM

## 2019-05-10 DIAGNOSIS — Z6838 Body mass index (BMI) 38.0-38.9, adult: Secondary | ICD-10-CM | POA: Diagnosis not present

## 2019-05-10 DIAGNOSIS — R0902 Hypoxemia: Secondary | ICD-10-CM

## 2019-05-10 DIAGNOSIS — Z882 Allergy status to sulfonamides status: Secondary | ICD-10-CM

## 2019-05-10 LAB — CBC WITH DIFFERENTIAL/PLATELET
Abs Immature Granulocytes: 0.05 10*3/uL (ref 0.00–0.07)
Basophils Absolute: 0 10*3/uL (ref 0.0–0.1)
Basophils Relative: 0 %
Eosinophils Absolute: 0 10*3/uL (ref 0.0–0.5)
Eosinophils Relative: 0 %
HCT: 37.1 % (ref 36.0–46.0)
Hemoglobin: 11 g/dL — ABNORMAL LOW (ref 12.0–15.0)
Immature Granulocytes: 1 %
Lymphocytes Relative: 17 %
Lymphs Abs: 1.7 10*3/uL (ref 0.7–4.0)
MCH: 25.4 pg — ABNORMAL LOW (ref 26.0–34.0)
MCHC: 29.6 g/dL — ABNORMAL LOW (ref 30.0–36.0)
MCV: 85.7 fL (ref 80.0–100.0)
Monocytes Absolute: 0.6 10*3/uL (ref 0.1–1.0)
Monocytes Relative: 6 %
Neutro Abs: 7.7 10*3/uL (ref 1.7–7.7)
Neutrophils Relative %: 76 %
Platelets: 320 10*3/uL (ref 150–400)
RBC: 4.33 MIL/uL (ref 3.87–5.11)
RDW: 14.2 % (ref 11.5–15.5)
WBC: 10.1 10*3/uL (ref 4.0–10.5)
nRBC: 0 % (ref 0.0–0.2)

## 2019-05-10 LAB — CBC
HCT: 37 % (ref 36.0–46.0)
Hemoglobin: 11 g/dL — ABNORMAL LOW (ref 12.0–15.0)
MCH: 25.5 pg — ABNORMAL LOW (ref 26.0–34.0)
MCHC: 29.7 g/dL — ABNORMAL LOW (ref 30.0–36.0)
MCV: 85.8 fL (ref 80.0–100.0)
Platelets: 298 10*3/uL (ref 150–400)
RBC: 4.31 MIL/uL (ref 3.87–5.11)
RDW: 14.4 % (ref 11.5–15.5)
WBC: 10.7 10*3/uL — ABNORMAL HIGH (ref 4.0–10.5)
nRBC: 0 % (ref 0.0–0.2)

## 2019-05-10 LAB — BASIC METABOLIC PANEL
Anion gap: 10 (ref 5–15)
BUN: 16 mg/dL (ref 8–23)
CO2: 29 mmol/L (ref 22–32)
Calcium: 9.3 mg/dL (ref 8.9–10.3)
Chloride: 101 mmol/L (ref 98–111)
Creatinine, Ser: 1.18 mg/dL — ABNORMAL HIGH (ref 0.44–1.00)
GFR calc Af Amer: 51 mL/min — ABNORMAL LOW (ref >60)
GFR calc non Af Amer: 44 mL/min — ABNORMAL LOW (ref >60)
Glucose, Bld: 153 mg/dL — ABNORMAL HIGH (ref 70–99)
Potassium: 3.7 mmol/L (ref 3.5–5.1)
Sodium: 140 mmol/L (ref 135–145)

## 2019-05-10 LAB — TROPONIN I (HIGH SENSITIVITY)
Troponin I (High Sensitivity): 91 ng/L — ABNORMAL HIGH (ref <18)
Troponin I (High Sensitivity): 75 ng/L — ABNORMAL HIGH (ref <18)

## 2019-05-10 LAB — HEMOGLOBIN A1C
Hgb A1c MFr Bld: 6.8 % — ABNORMAL HIGH (ref 4.8–5.6)
Mean Plasma Glucose: 148.46 mg/dL

## 2019-05-10 LAB — BRAIN NATRIURETIC PEPTIDE: B Natriuretic Peptide: 361 pg/mL — ABNORMAL HIGH (ref 0.0–100.0)

## 2019-05-10 LAB — RESPIRATORY PANEL BY RT PCR (FLU A&B, COVID)
Influenza A by PCR: NEGATIVE
Influenza B by PCR: NEGATIVE
SARS Coronavirus 2 by RT PCR: NEGATIVE

## 2019-05-10 LAB — GLUCOSE, CAPILLARY
Glucose-Capillary: 153 mg/dL — ABNORMAL HIGH (ref 70–99)
Glucose-Capillary: 259 mg/dL — ABNORMAL HIGH (ref 70–99)

## 2019-05-10 LAB — TSH: TSH: 0.994 u[IU]/mL (ref 0.350–4.500)

## 2019-05-10 MED ORDER — METHYLPREDNISOLONE SODIUM SUCC 125 MG IJ SOLR
125.0000 mg | Freq: Once | INTRAMUSCULAR | Status: AC
  Administered 2019-05-10: 125 mg via INTRAVENOUS
  Filled 2019-05-10: qty 2

## 2019-05-10 MED ORDER — CHLORTHALIDONE 25 MG PO TABS
12.5000 mg | ORAL_TABLET | Freq: Every day | ORAL | Status: DC
  Administered 2019-05-10 – 2019-05-14 (×5): 12.5 mg via ORAL
  Filled 2019-05-10 (×6): qty 0.5

## 2019-05-10 MED ORDER — ALBUTEROL SULFATE HFA 108 (90 BASE) MCG/ACT IN AERS
2.0000 | INHALATION_SPRAY | Freq: Once | RESPIRATORY_TRACT | Status: AC
  Administered 2019-05-10: 2 via RESPIRATORY_TRACT
  Filled 2019-05-10: qty 6.7

## 2019-05-10 MED ORDER — POTASSIUM CHLORIDE ER 10 MEQ PO TBCR
10.0000 meq | EXTENDED_RELEASE_TABLET | Freq: Every day | ORAL | Status: DC
  Administered 2019-05-10 – 2019-05-14 (×5): 10 meq via ORAL
  Filled 2019-05-10 (×10): qty 1

## 2019-05-10 MED ORDER — CARVEDILOL 12.5 MG PO TABS
12.5000 mg | ORAL_TABLET | Freq: Two times a day (BID) | ORAL | Status: DC
  Administered 2019-05-10 – 2019-05-14 (×8): 12.5 mg via ORAL
  Filled 2019-05-10 (×8): qty 1

## 2019-05-10 MED ORDER — IPRATROPIUM-ALBUTEROL 0.5-2.5 (3) MG/3ML IN SOLN
3.0000 mL | Freq: Four times a day (QID) | RESPIRATORY_TRACT | Status: DC
  Administered 2019-05-10: 3 mL via RESPIRATORY_TRACT
  Filled 2019-05-10: qty 3

## 2019-05-10 MED ORDER — AMMONIUM LACTATE 12 % EX LOTN
TOPICAL_LOTION | CUTANEOUS | Status: DC | PRN
  Filled 2019-05-10: qty 225

## 2019-05-10 MED ORDER — LABETALOL HCL 5 MG/ML IV SOLN
20.0000 mg | Freq: Once | INTRAVENOUS | Status: AC
  Administered 2019-05-10: 20 mg via INTRAVENOUS
  Filled 2019-05-10: qty 4

## 2019-05-10 MED ORDER — LORAZEPAM 2 MG/ML IJ SOLN
1.0000 mg | Freq: Once | INTRAMUSCULAR | Status: AC
  Administered 2019-05-10: 1 mg via INTRAVENOUS
  Filled 2019-05-10: qty 1

## 2019-05-10 MED ORDER — ALBUTEROL SULFATE (2.5 MG/3ML) 0.083% IN NEBU: 2.5000 mg | INHALATION_SOLUTION | RESPIRATORY_TRACT | Status: DC | PRN

## 2019-05-10 MED ORDER — ACETAMINOPHEN 650 MG RE SUPP: 650.0000 mg | Freq: Four times a day (QID) | RECTAL | Status: DC | PRN

## 2019-05-10 MED ORDER — AEROCHAMBER PLUS FLO-VU MEDIUM MISC
1.0000 | Freq: Once | Status: AC
  Administered 2019-05-10: 1
  Filled 2019-05-10: qty 1

## 2019-05-10 MED ORDER — POLYETHYLENE GLYCOL 3350 17 G PO PACK: 17.0000 g | PACK | Freq: Every day | ORAL | Status: DC | PRN

## 2019-05-10 MED ORDER — TRAZODONE HCL 50 MG PO TABS
50.0000 mg | ORAL_TABLET | Freq: Every evening | ORAL | Status: DC | PRN
  Administered 2019-05-11 – 2019-05-13 (×4): 50 mg via ORAL
  Filled 2019-05-10 (×4): qty 1

## 2019-05-10 MED ORDER — ENOXAPARIN SODIUM 40 MG/0.4ML ~~LOC~~ SOLN
40.0000 mg | SUBCUTANEOUS | Status: DC
  Administered 2019-05-10 – 2019-05-12 (×3): 40 mg via SUBCUTANEOUS
  Filled 2019-05-10 (×3): qty 0.4

## 2019-05-10 MED ORDER — LORAZEPAM 2 MG/ML IJ SOLN
0.5000 mg | Freq: Once | INTRAMUSCULAR | Status: AC
  Administered 2019-05-10: 0.5 mg via INTRAVENOUS
  Filled 2019-05-10: qty 1

## 2019-05-10 MED ORDER — BISACODYL 5 MG PO TBEC
5.0000 mg | DELAYED_RELEASE_TABLET | Freq: Every day | ORAL | Status: DC | PRN
  Filled 2019-05-10 (×2): qty 1

## 2019-05-10 MED ORDER — AEROCHAMBER PLUS FLO-VU LARGE MISC
Status: AC
  Filled 2019-05-10: qty 1

## 2019-05-10 MED ORDER — METHYLPREDNISOLONE SODIUM SUCC 125 MG IJ SOLR
60.0000 mg | Freq: Four times a day (QID) | INTRAMUSCULAR | Status: DC
  Administered 2019-05-10 – 2019-05-11 (×3): 60 mg via INTRAVENOUS
  Filled 2019-05-10 (×3): qty 2

## 2019-05-10 MED ORDER — ALBUTEROL (5 MG/ML) CONTINUOUS INHALATION SOLN
10.0000 mg/h | INHALATION_SOLUTION | Freq: Once | RESPIRATORY_TRACT | Status: AC
  Administered 2019-05-10: 10 mg/h via RESPIRATORY_TRACT
  Filled 2019-05-10: qty 20

## 2019-05-10 MED ORDER — LEVOTHYROXINE SODIUM 25 MCG PO TABS
25.0000 ug | ORAL_TABLET | Freq: Every day | ORAL | Status: DC
  Administered 2019-05-11 – 2019-05-14 (×4): 25 ug via ORAL
  Filled 2019-05-10 (×4): qty 1

## 2019-05-10 MED ORDER — ACETAMINOPHEN 325 MG PO TABS
650.0000 mg | ORAL_TABLET | Freq: Four times a day (QID) | ORAL | Status: DC | PRN
  Administered 2019-05-11: 650 mg via ORAL
  Filled 2019-05-10: qty 2

## 2019-05-10 MED ORDER — IOHEXOL 350 MG/ML SOLN
75.0000 mL | Freq: Once | INTRAVENOUS | Status: AC | PRN
  Administered 2019-05-10: 75 mL via INTRAVENOUS

## 2019-05-10 MED ORDER — CITALOPRAM HYDROBROMIDE 20 MG PO TABS
20.0000 mg | ORAL_TABLET | Freq: Every day | ORAL | Status: DC
  Administered 2019-05-11 – 2019-05-14 (×4): 20 mg via ORAL
  Filled 2019-05-10 (×4): qty 1

## 2019-05-10 MED ORDER — IRBESARTAN 300 MG PO TABS
300.0000 mg | ORAL_TABLET | Freq: Every day | ORAL | Status: DC
  Administered 2019-05-11 – 2019-05-14 (×4): 300 mg via ORAL
  Filled 2019-05-10 (×4): qty 1

## 2019-05-10 MED ORDER — INSULIN ASPART 100 UNIT/ML ~~LOC~~ SOLN
0.0000 [IU] | Freq: Three times a day (TID) | SUBCUTANEOUS | Status: DC
  Administered 2019-05-10: 2 [IU] via SUBCUTANEOUS
  Administered 2019-05-11: 3 [IU] via SUBCUTANEOUS
  Administered 2019-05-11 – 2019-05-13 (×7): 2 [IU] via SUBCUTANEOUS
  Administered 2019-05-13: 3 [IU] via SUBCUTANEOUS
  Administered 2019-05-14 (×2): 2 [IU] via SUBCUTANEOUS

## 2019-05-10 NOTE — ED Triage Notes (Signed)
Pt here from doctors office for SOB, HTN, and dizziness that began 3 weeks ago. At office, pt O2 sat was low 80s on RA, BP was 230/103. Pt on BP meds.EMS placed pt on 4L via [place], O2 sat 97%. Pt has hx of PE, not on blood thinners. Pt reports cramping under L breasted that began on the way here. AOx4, VSS.

## 2019-05-10 NOTE — H&P (Signed)
History and Physical:    Virginia Summers   NUU:[id number] DOB: [birth year] DOA: 05/10/2019  Referring MD/provider: Dr. Particia Nearing PCP: Eather Colas, FNP   Patient coming from: Home  Chief Complaint: Shortness of breath  History of Present Illness:   Virginia Summers is an 79 y.o. 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 Elease Hashimoto, and hypothyroidism who was seen in her PCP office today for routine management of difficult to control hypertension.  At that time she admitted to feeling short of breath and was noted to have O2 saturations in the 80s on room air along with audible wheezing.  Patient was sent to the ED for evaluation and treatment.  Patient states she has been short of breath for "a long time now honey", more than 1 year.  She states that she intermittently gets very short of breath with minimal activity and has to rest frequently.  She denies history of COPD as far she knows but does admit to having inhalers at home which she uses.  She also complains of intermittent palpitations and associated left-sided chest discomfort which come and go for the past year.  She notes she was told she had "blockages but they did not need to do anything".  This morning patient states that she was "worse than I usually a.m.".  Denies recent fevers or chills or cough.  Denies PND.  Admits to a third of a pack a day tobacco use for only 8 to 9 years, quit many many years ago.  Of note she did live with her husband for 56 years and he smoked 3 packs of cigarettes per day.  ED Course:  The patient was noted to be hypoxic with minimal respiratory distress.  She was noted to have audible wheezing throughout all lung fields.  CT angiogram was negative for PE or any space-occupying opacifications.  Covid PCR was negative.  She was treated with Solu-Medrol and inhaled bronchodilators.  ROS:     ROS   Review of Systems: General: Denies fever, chills, malaise,  Endocrine: Denies heat/cold intolerance, polyuria or weight loss. GI: Denies nausea, vomiting, diarrhea or constipation GU: Denies dysuria, frequency or hematuria Blood/lymphatics: Denies easy bruising or bleeding Mood/affect: Denies anxiety/depression    Past Medical History:   Past Medical History:  Diagnosis Date  . Anticoagulant long-term use   . Chest pain   . Diabetes mellitus   . Diverticulitis   . DVT (deep venous thrombosis) (HCC)    BILATERAL, March 2012  . GERD (gastroesophageal reflux disease)   . HTN (hypertension)   . Hypothyroidism   . Pulmonary embolism [facility])     Past Surgical History:   Past Surgical History:  Procedure Laterality Date  . APPENDECTOMY  AGE 89  . BACK SURGERY  2000   DISC  . PARTIAL HYSTERECTOMY  AGE 57's APPROX.  Marland Kitchen SECONDARY INTRAOCULAR LENSE IMPLANTATION  2005,2006   OU  . SHOULDER SURGERY  5-6 YRS OLD   PINS    Social History:   Social History   Socioeconomic History  . Marital status: Married    Spouse name: Not on file  . Number of children: Not on file  . Years of education: Not on file  . Highest education level: Not on file  Occupational History  . Not on file  Tobacco Use  . Smoking status: Former Smoker    Packs/day:  0.50    Years: 30.00    Pack years: 15.00    Types: Cigarettes    Quit date: 03/15/2003    Years since quitting: 16.1  . Smokeless tobacco: Never Used  Substance and Sexual Activity  . Alcohol use: No  . Drug use: No  . Sexual activity: Not on file  Other Topics Concern  . Not on file  Social History Narrative  . Not on file   Social Determinants of Health   Financial Resource Strain:   . Difficulty of Paying Living Expenses: Not on file  Food Insecurity:   . Worried About Programme researcher, broadcasting/film/video in the Last Year: Not on file  . Ran Out of Food in the Last Year: Not on file  Transportation Needs:   . Lack of  Transportation (Medical): Not on file  . Lack of Transportation (Non-Medical): Not on file  Physical Activity:   . Days of Exercise per Week: Not on file  . Minutes of Exercise per Session: Not on file  Stress:   . Feeling of Stress : Not on file  Social Connections:   . Frequency of Communication with Friends and Family: Not on file  . Frequency of Social Gatherings with Friends and Family: Not on file  . Attends Religious Services: Not on file  . Active Member of Clubs or Organizations: Not on file  . Attends Banker Meetings: Not on file  . Marital Status: Not on file  Intimate Partner Violence:   . Fear of Current or Ex-Partner: Not on file  . Emotionally Abused: Not on file  . Physically Abused: Not on file  . Sexually Abused: Not on file    Allergies   Penicillins and Sulfa drugs cross reactors  Family history:   Family History  Problem Relation Age of Onset  . Transient ischemic attack Father   . Coronary artery disease Father   . Stroke Mother   . Diabetes Mother   . Diabetes Sister   . Hypertension Daughter     Current Medications:   Prior to Admission medications   Medication Sig Start Date End Date Taking? Authorizing Provider  albuterol (PROVENTIL HFA) 108 (90 BASE) MCG/ACT inhaler Inhale 2 puffs into the lungs every 4 (four) hours as needed. For shortness of breath.   Yes [provider]  budesonide-formoterol (SYMBICORT) 80-4.5 MCG/ACT inhaler Inhale 2 puffs into the lungs 2 (two) times daily.   Yes [provider]  carvedilol (COREG) 12.5 MG tablet Take 12.5 mg by mouth 2 (two) times daily with a meal.   Yes [provider]  citalopram (CELEXA) 20 MG tablet Take 20 mg by mouth daily.  04/08/15  Yes [provider]  fluticasone (FLONASE) 50 MCG/ACT nasal spray Place 2 sprays into both nostrils daily. 09/26/18  Yes [provider]  levothyroxine (SYNTHROID, LEVOTHROID) 25 MCG tablet TAKE 1 TABLET (25  MCG TOTAL) BY MOUTH DAILY. Patient taking differently: Take 25 mcg by mouth daily before breakfast.  03/08/13  Yes Nahser, Deloris Ping, MD  metFORMIN (GLUCOPHAGE-XR) 500 MG 24 hr tablet Take 500 mg by mouth 2 (two) times daily before a meal.   Yes [provider]  predniSONE (DELTASONE) 5 MG tablet Take 10 mg by mouth daily with breakfast.    Yes [provider]  traZODone (DESYREL) 50 MG tablet Take 50 mg by mouth at bedtime as needed for sleep.  09/18/17  Yes [provider]  valsartan (DIOVAN) 320 MG tablet  Take 1 tablet (320 mg total) by mouth daily. 05/09/19  Yes Nahser, Wonda Cheng, MD  chlorthalidone (HYGROTON) 25 MG tablet Take 0.5 tablets (12.5 mg total) by mouth daily. 05/09/19   Nahser, Wonda Cheng, MD  potassium chloride (KLOR-CON) 10 MEQ tablet Take 1 tablet (10 mEq total) by mouth daily. 05/09/19   Nahser, Wonda Cheng, MD    Physical Exam:   Vitals:   05/10/19 1502 05/10/19 1503 05/10/19 1515 05/10/19 1536  BP: (!) 197/76 (!) 197/76 (!) 171/82 132/84  Pulse:  85 70 70  Resp:  16 20 (!) 28  Temp:      TempSrc:      SpO2:  99% 100% 99%    Physical Exam: Blood pressure 132/84, pulse 70, temperature 98 F (36.7 C), temperature source Oral, resp. rate (!) 28, SpO2 99 %. Gen: Pleasant, friendly female in good spirits sitting up in stretcher with minimal respiratory distress.  She needs to take a breath between short sentences, but is voluble.  No accessory muscle use. Eyes: sclera anicteric, conjuctiva clear CVS: Distant heart sounds, regular, no murmurs. Respiratory: Barrel chested, decreased air entry bilaterally with rare scattered high-pitched expiratory wheeze. GI: Obese, firm but not hard, she does have some tenderness in her left upper quadrant.  I am unable to palpate any masses.?  Rebound focally at left upper quadrant.   LE: No edema. No cyanosis Neuro: A/O x 3, Moving all extremities equally with normal strength, CN 3-12 intact, grossly nonfocal.  Psych:  patient is logical and coherent, judgement and insight appear normal, mood and affect appropriate to situation. Skin: no rashes or lesions or ulcers,    Data Review:    Labs: Basic Metabolic Panel: Recent Labs  Lab 05/10/19 1324  NA 140  K 3.7  CL 101  CO2 29  GLUCOSE 153*  BUN 16  CREATININE 1.18*  CALCIUM 9.3   Liver Function Tests: No results for input(s): AST, ALT, ALKPHOS, BILITOT, PROT, ALBUMIN in the last 168 hours. No results for input(s): LIPASE, AMYLASE in the last 168 hours. No results for input(s): AMMONIA in the last 168 hours. CBC: Recent Labs  Lab 05/10/19 1324  WBC 10.1  NEUTROABS 7.7  HGB 11.0*  HCT 37.1  MCV 85.7  PLT 320   Cardiac Enzymes: No results for input(s): CKTOTAL, CKMB, CKMBINDEX, TROPONINI in the last 168 hours.  BNP (last 3 results) No results for input(s): PROBNP in the last 8760 hours. CBG: No results for input(s): GLUCAP in the last 168 hours.  Urinalysis No results found for: COLORURINE, APPEARANCEUR, LABSPEC, PHURINE, GLUCOSEU, HGBUR, BILIRUBINUR, KETONESUR, PROTEINUR, UROBILINOGEN, NITRITE, LEUKOCYTESUR    Radiographic Studies: CT Angio Chest PE W and/or Wo Contrast  Result Date: 05/10/2019 CLINICAL DATA:  Shortness of breath and chest pain. History of pulmonary embolus. EXAM: CT ANGIOGRAPHY CHEST WITH CONTRAST TECHNIQUE: Multidetector CT imaging of the chest was performed using the standard protocol during bolus administration of intravenous contrast. Multiplanar CT image reconstructions and MIPs were obtained to evaluate the vascular anatomy. CONTRAST:  75 mL OMNIPAQUE IOHEXOL 350 MG/ML SOLN COMPARISON:  CT chest 05/26/2010. Single-view of the chest earlier today. FINDINGS: Cardiovascular: Satisfactory opacification of the pulmonary arteries to the segmental level. No evidence of pulmonary embolism. Normal heart size. No pericardial effusion. Aortic atherosclerosis noted. Mediastinum/Nodes: No enlarged mediastinal, hilar, or  axillary lymph nodes. Thyroid gland, trachea, and esophagus demonstrate no significant findings. A small hiatal hernia is seen. Lungs/Pleura: No pleural effusion. Mild dependent atelectasis. The lungs are  otherwise clear. Upper Abdomen: Negative. Musculoskeletal: No acute or focal bony abnormality. Review of the MIP images confirms the above findings. IMPRESSION: 1. Negative for pulmonary embolus. No acute disease. 2. Small hiatal hernia. Aortic Atherosclerosis (ICD10-I70.0). Electronically Signed   By: Drusilla Kanner M.D.   On: 05/10/2019 15:05   DG Chest Port 1 View  Result Date: 05/10/2019 CLINICAL DATA:  Shortness of breath for 3 weeks EXAM: PORTABLE CHEST 1 VIEW COMPARISON:  09/26/2018 FINDINGS: There is bibasilar hazy airspace disease concerning for atelectasis versus pneumonia. There is no pleural effusion or pneumothorax. The heart and mediastinal contours are unremarkable. There is no acute osseous abnormality. IMPRESSION: Bibasilar hazy airspace disease concerning for atelectasis versus pneumonia. Electronically Signed   By: Elige Ko   On: 05/10/2019 14:16    EKG: Independently reviewed.  Sinus rhythm at 80.  LBBB.   Assessment/Plan:   Principal Problem:   COPD with acute exacerbation (HCC) Active Problems:   HTN (hypertension)   Pulmonary HTN (HCC)   Hypothyroidism   History of pulmonary embolus (PE)  79 year old with COPD,  and hypertension presents with acute exacerbation and hypertensive urgency.  She had some atypical, intermittent chest pain on and off for the past year, is followed closely by Dr. Elease Hashimoto.  COPD Minimal wheezing on my exam, although she does have decreased air entry bilaterally We will treat with oxygen, Solu-Medrol and duo nebs  CHEST PAIN EKG with LBBB which is old. Check troponin now and in 2 hours.  HTN Patient with hypertensive urgency on presentation. She was treated with labetalol with marked decrease in blood pressure in ED. Continue  carvedilol, chlorthalidone and irbesartan per home doses Further titration of BP meds can occur either as an inpatient or outpatient as warranted.  LUQ TENDERNESS Patient with some left upper quadrant tenderness with may be some focal rebound Patient states it has been "touchy for a while". Will order abdominal CT, noncontrasted as patient had a CTA chest earlier today.  H/O VTE Patient is apparently not on any anticoagulation due to cost per outpatient note by Dr. Elease Hashimoto. CTA today negative for PE.  ANXIETY AND DEPRESSION Continue Celexa and trazodone  DM2 Hold metformin, SSI AC at bedtime   Other information:   DVT prophylaxis: Enoxaparin ordered. Code Status: Full Family Communication: Patient states there is no need to call family, she has been talking to them Disposition Plan: Home Consults called: None Admission status: Inpatient  Natanael Saladin Tublu Daina Cara [facility]  If 7PM-7AM, please contact night-coverage [email or web address] Password [facility] 05/10/2019, 3:43 PM

## 2019-05-10 NOTE — ED Provider Notes (Signed)
[facility]: [facility] EMERGENCY DEPARTMENT Provider Note   CSN: [id number] Arrival date & time: 05/10/19  1310     History Chief Complaint  Patient presents with  . Shortness of Breath  . Chest Pain  . Hypertension    Virginia Summers is a 79 y.o. female.  Pt presents to the ED from her doctor's office.  Pt has been having sob and high bp.  Pt's RA O2 sat 87%, so she was put on 4L and EMS was called to bring her here.  On 4L, O2 sat has improved to the mid-90s.  Pt has a hx of PE, but is not currently on anticoagulants.  Yesterday, Dr. Elease Hashimoto told her to stop losartan tomorrow and to start valsartan and to stop hctz on 3/1 and start chlorthalidone.  She has not changed her bp meds yet.  She took her nl am meds today.  Pt feels anxious as well.         Past Medical History:  Diagnosis Date  . Anticoagulant long-term use   . Chest pain   . Diabetes mellitus   . Diverticulitis   . DVT (deep venous thrombosis) (HCC)    BILATERAL, March 2012  . GERD (gastroesophageal reflux disease)   . HTN (hypertension)   . Hypothyroidism   . Pulmonary embolism [facility])     Patient Active Problem List   Diagnosis Date Noted  . COPD with acute exacerbation (HCC) 05/10/2019  . History of pulmonary embolus (PE) 05/10/2019  . Hiatal hernia 05/12/2011  . Hypothyroidism 08/10/2010  . Cramp 06/09/2010  . Pulmonary HTN (HCC) 06/09/2010  . Pulmonary embolism (HCC)   . HTN (hypertension)   . Anticoagulant long-term use     Past Surgical History:  Procedure Laterality Date  . APPENDECTOMY  AGE 55  . BACK SURGERY  2000   DISC  . PARTIAL HYSTERECTOMY  AGE 52's APPROX.  Marland Kitchen SECONDARY INTRAOCULAR LENSE IMPLANTATION  2005,2006   OU  . SHOULDER SURGERY  5-6 YRS OLD   PINS     OB History   No obstetric history on file.     Family History  Problem Relation Age of Onset  . Transient ischemic attack Father   . Coronary artery disease Father   . Stroke Mother   . Diabetes Mother   .  Diabetes Sister   . Hypertension Daughter     Social History   Tobacco Use  . Smoking status: Former Smoker    Packs/day: 0.50    Years: 30.00    Pack years: 15.00    Types: Cigarettes    Quit date: 03/15/2003    Years since quitting: 16.1  . Smokeless tobacco: Never Used  Substance Use Topics  . Alcohol use: No  . Drug use: No    Home Medications Prior to Admission medications   Medication Sig Start Date End Date Taking? Authorizing Provider  albuterol (PROVENTIL HFA) 108 (90 BASE) MCG/ACT inhaler Inhale 2 puffs into the lungs every 4 (four) hours as needed. For shortness of breath.   Yes [provider]  budesonide-formoterol (SYMBICORT) 80-4.5 MCG/ACT inhaler Inhale 2 puffs into the lungs 2 (two) times daily.   Yes [provider]  carvedilol (COREG) 12.5 MG tablet Take 12.5 mg by mouth 2 (two) times daily with a meal.   Yes [provider]  citalopram (CELEXA) 20 MG tablet Take 20 mg by mouth daily.  04/08/15  Yes [provider]  fluticasone (FLONASE) 50 MCG/ACT nasal  spray Place 2 sprays into both nostrils daily. 09/26/18  Yes [provider]  levothyroxine (SYNTHROID, LEVOTHROID) 25 MCG tablet TAKE 1 TABLET (25 MCG TOTAL) BY MOUTH DAILY. Patient taking differently: Take 25 mcg by mouth daily before breakfast.  03/08/13  Yes Nahser, Deloris Ping, MD  metFORMIN (GLUCOPHAGE-XR) 500 MG 24 hr tablet Take 500 mg by mouth 2 (two) times daily before a meal.   Yes [provider]  predniSONE (DELTASONE) 5 MG tablet Take 10 mg by mouth daily with breakfast.    Yes [provider]  traZODone (DESYREL) 50 MG tablet Take 50 mg by mouth at bedtime as needed for sleep.  09/18/17  Yes [provider]  valsartan (DIOVAN) 320 MG tablet Take 1 tablet (320 mg total) by mouth daily. 05/09/19  Yes Nahser, Deloris Ping, MD  chlorthalidone (HYGROTON) 25 MG tablet Take 0.5 tablets (12.5 mg total) by mouth daily. 05/09/19   Nahser, Deloris Ping, MD    potassium chloride (KLOR-CON) 10 MEQ tablet Take 1 tablet (10 mEq total) by mouth daily. 05/09/19   Nahser, Deloris Ping, MD    Allergies    Penicillins and Sulfa drugs cross reactors  Review of Systems   Review of Systems  Respiratory: Positive for shortness of breath.   All other systems reviewed and are negative.   Physical Exam Updated Vital Signs BP (!) 130/92   Pulse 69   Temp 98 F (36.7 C) (Oral)   Resp (!) 25   SpO2 94%   Physical Exam Vitals and nursing note reviewed.  Constitutional:      Appearance: She is well-developed. She is obese.  HENT:     Head: Normocephalic and atraumatic.     Mouth/Throat:     Mouth: Mucous membranes are moist.     Pharynx: Oropharynx is clear.  Eyes:     Extraocular Movements: Extraocular movements intact.     Pupils: Pupils are equal, round, and reactive to light.  Cardiovascular:     Rate and Rhythm: Normal rate and regular rhythm.  Pulmonary:     Effort: Tachypnea present.     Breath sounds: Wheezing present.  Abdominal:     General: Bowel sounds are normal.     Palpations: Abdomen is soft.  Musculoskeletal:        General: Normal range of motion.     Cervical back: Normal range of motion and neck supple.     Right lower leg: Edema present.     Left lower leg: Edema present.  Skin:    General: Skin is warm.     Capillary Refill: Capillary refill takes less than 2 seconds.  Neurological:     General: No focal deficit present.     Mental Status: She is alert and oriented to person, place, and time.  Psychiatric:        Mood and Affect: Mood normal.        Behavior: Behavior normal.     ED Results / Procedures / Treatments   Labs (all labs ordered are listed, but only abnormal results are displayed) Labs Reviewed  BASIC METABOLIC PANEL - Abnormal; Notable for the following components:      Result Value   Glucose, Bld 153 (*)    Creatinine, Ser 1.18 (*)    GFR calc non Af Amer 44 (*)    GFR calc Af Amer 51 (*)     All other components within normal limits  CBC WITH DIFFERENTIAL/PLATELET - Abnormal; Notable for the  following components:   Hemoglobin 11.0 (*)    MCH 25.4 (*)    MCHC 29.6 (*)    All other components within normal limits  BRAIN NATRIURETIC PEPTIDE - Abnormal; Notable for the following components:   B Natriuretic Peptide 361.0 (*)    All other components within normal limits  RESPIRATORY PANEL BY RT PCR (FLU A&B, COVID)    EKG EKG Interpretation  Date/Time:  Friday May 10 2019 13:16:26 EST Ventricular Rate:  84 PR Interval:    QRS Duration: 140 QT Interval:  421 QTC Calculation: 498 R Axis:   -39 Text Interpretation: Sinus rhythm Left bundle branch block No significant change since last tracing Confirmed by Jacalyn Lefevre [phone]) on 05/10/2019 1:38:15 PM   Radiology CT Angio Chest PE W and/or Wo Contrast  Result Date: 05/10/2019 CLINICAL DATA:  Shortness of breath and chest pain. History of pulmonary embolus. EXAM: CT ANGIOGRAPHY CHEST WITH CONTRAST TECHNIQUE: Multidetector CT imaging of the chest was performed using the standard protocol during bolus administration of intravenous contrast. Multiplanar CT image reconstructions and MIPs were obtained to evaluate the vascular anatomy. CONTRAST:  75 mL OMNIPAQUE IOHEXOL 350 MG/ML SOLN COMPARISON:  CT chest 05/26/2010. Single-view of the chest earlier today. FINDINGS: Cardiovascular: Satisfactory opacification of the pulmonary arteries to the segmental level. No evidence of pulmonary embolism. Normal heart size. No pericardial effusion. Aortic atherosclerosis noted. Mediastinum/Nodes: No enlarged mediastinal, hilar, or axillary lymph nodes. Thyroid gland, trachea, and esophagus demonstrate no significant findings. A small hiatal hernia is seen. Lungs/Pleura: No pleural effusion. Mild dependent atelectasis. The lungs are otherwise clear. Upper Abdomen: Negative. Musculoskeletal: No acute or focal bony abnormality. Review of the MIP  images confirms the above findings. IMPRESSION: 1. Negative for pulmonary embolus. No acute disease. 2. Small hiatal hernia. Aortic Atherosclerosis (ICD10-I70.0). Electronically Signed   By: Drusilla Kanner M.D.   On: 05/10/2019 15:05   DG Chest Port 1 View  Result Date: 05/10/2019 CLINICAL DATA:  Shortness of breath for 3 weeks EXAM: PORTABLE CHEST 1 VIEW COMPARISON:  09/26/2018 FINDINGS: There is bibasilar hazy airspace disease concerning for atelectasis versus pneumonia. There is no pleural effusion or pneumothorax. The heart and mediastinal contours are unremarkable. There is no acute osseous abnormality. IMPRESSION: Bibasilar hazy airspace disease concerning for atelectasis versus pneumonia. Electronically Signed   By: Elige Ko   On: 05/10/2019 14:16    Procedures Procedures (including critical care time)  Medications Ordered in ED Medications  albuterol (PROVENTIL,VENTOLIN) solution continuous neb (has no administration in time range)  LORazepam (ATIVAN) injection 0.5 mg (0.5 mg Intravenous Given 05/10/19 1334)  iohexol (OMNIPAQUE) 350 MG/ML injection 75 mL (75 mLs Intravenous Contrast Given 05/10/19 1451)  LORazepam (ATIVAN) injection 1 mg (1 mg Intravenous Given 05/10/19 1501)  labetalol (NORMODYNE) injection 20 mg (20 mg Intravenous Given 05/10/19 1512)  methylPREDNISolone sodium succinate (SOLU-MEDROL) 125 mg/2 mL injection 125 mg (125 mg Intravenous Given 05/10/19 1519)  albuterol (VENTOLIN HFA) 108 (90 Base) MCG/ACT inhaler 2 puff (2 puffs Inhalation Given 05/10/19 1523)  AeroChamber Plus Flo-Vu Medium MISC 1 each ( Other Not Given 05/10/19 1538)    ED Course  I have reviewed the triage vital signs and the nursing notes.  Pertinent labs & imaging results that were available during my care of the patient were reviewed by me and considered in my medical decision making (see chart for details).    MDM Rules/Calculators/A&P  Pt does not have a PE.  She has no  pna.  Covid negative.  Pt given solumedrol and albuterol.  Pt is wheezing and so likely has undiagnosed COPD.  The pt d/w Dr. Jamse Arn ([facility]) for admission.  Virginia Summers was evaluated in Emergency Department on 05/10/2019 for the symptoms described in the history of present illness. She was evaluated in the context of the global COVID-19 pandemic, which necessitated consideration that the patient might be at risk for infection with the SARS-CoV-2 virus that causes COVID-19. Institutional protocols and algorithms that pertain to the evaluation of patients at risk for COVID-19 are in a state of rapid change based on information released by regulatory bodies including the CDC and federal and state organizations. These policies and algorithms were followed during the patient's care in the ED.  Final Clinical Impression(s) / ED Diagnoses Final diagnoses:  Acute respiratory failure with hypoxia [facility])    Rx / DC Orders ED Discharge Orders    None       Isla Pence, MD 05/10/19 [phone]

## 2019-05-10 NOTE — Plan of Care (Signed)

## 2019-05-11 DIAGNOSIS — I10 Essential (primary) hypertension: Secondary | ICD-10-CM

## 2019-05-11 LAB — CBC
HCT: 33.3 % — ABNORMAL LOW (ref 36.0–46.0)
Hemoglobin: 10.1 g/dL — ABNORMAL LOW (ref 12.0–15.0)
MCH: 25.6 pg — ABNORMAL LOW (ref 26.0–34.0)
MCHC: 30.3 g/dL (ref 30.0–36.0)
MCV: 84.5 fL (ref 80.0–100.0)
Platelets: 278 10*3/uL (ref 150–400)
RBC: 3.94 MIL/uL (ref 3.87–5.11)
RDW: 14.3 % (ref 11.5–15.5)
WBC: 6.5 10*3/uL (ref 4.0–10.5)
nRBC: 0 % (ref 0.0–0.2)

## 2019-05-11 LAB — BASIC METABOLIC PANEL
Anion gap: 12 (ref 5–15)
BUN: 20 mg/dL (ref 8–23)
CO2: 27 mmol/L (ref 22–32)
Calcium: 8.9 mg/dL (ref 8.9–10.3)
Chloride: 98 mmol/L (ref 98–111)
Creatinine, Ser: 0.95 mg/dL (ref 0.44–1.00)
GFR calc Af Amer: >60 mL/min (ref >60)
GFR calc non Af Amer: 57 mL/min — ABNORMAL LOW (ref >60)
Glucose, Bld: 240 mg/dL — ABNORMAL HIGH (ref 70–99)
Potassium: 3.6 mmol/L (ref 3.5–5.1)
Sodium: 137 mmol/L (ref 135–145)

## 2019-05-11 LAB — GLUCOSE, CAPILLARY
Glucose-Capillary: 183 mg/dL — ABNORMAL HIGH (ref 70–99)
Glucose-Capillary: 232 mg/dL — ABNORMAL HIGH (ref 70–99)
Glucose-Capillary: 188 mg/dL — ABNORMAL HIGH (ref 70–99)
Glucose-Capillary: 189 mg/dL — ABNORMAL HIGH (ref 70–99)
Glucose-Capillary: 299 mg/dL — ABNORMAL HIGH (ref 70–99)

## 2019-05-11 MED ORDER — SODIUM CHLORIDE 0.9 % IV SOLN
1.0000 g | INTRAVENOUS | Status: DC
  Filled 2019-05-11: qty 10

## 2019-05-11 MED ORDER — BUTALBITAL-APAP-CAFFEINE 50-325-40 MG PO TABS
1.0000 | ORAL_TABLET | ORAL | Status: DC | PRN
  Administered 2019-05-12 – 2019-05-14 (×8): 1 via ORAL
  Filled 2019-05-11 (×8): qty 1

## 2019-05-11 MED ORDER — PANTOPRAZOLE SODIUM 40 MG PO TBEC
40.0000 mg | DELAYED_RELEASE_TABLET | Freq: Every day | ORAL | Status: DC
  Administered 2019-05-11 – 2019-05-14 (×4): 40 mg via ORAL
  Filled 2019-05-11 (×4): qty 1

## 2019-05-11 MED ORDER — ALPRAZOLAM 0.25 MG PO TABS: 0.2500 mg | ORAL_TABLET | Freq: Two times a day (BID) | ORAL | Status: DC | PRN

## 2019-05-11 MED ORDER — INSULIN GLARGINE 100 UNIT/ML ~~LOC~~ SOLN
6.0000 [IU] | Freq: Every day | SUBCUTANEOUS | Status: DC
  Administered 2019-05-11 – 2019-05-12 (×2): 6 [IU] via SUBCUTANEOUS
  Filled 2019-05-11 (×4): qty 0.06

## 2019-05-11 MED ORDER — METHYLPREDNISOLONE SODIUM SUCC 40 MG IJ SOLR
40.0000 mg | Freq: Three times a day (TID) | INTRAMUSCULAR | Status: DC
  Administered 2019-05-11 – 2019-05-14 (×8): 40 mg via INTRAVENOUS
  Filled 2019-05-11 (×8): qty 1

## 2019-05-11 MED ORDER — IPRATROPIUM-ALBUTEROL 0.5-2.5 (3) MG/3ML IN SOLN
3.0000 mL | Freq: Three times a day (TID) | RESPIRATORY_TRACT | Status: DC
  Administered 2019-05-11 (×3): 3 mL via RESPIRATORY_TRACT
  Filled 2019-05-11 (×3): qty 3

## 2019-05-11 MED ORDER — SODIUM CHLORIDE 0.9 % IV SOLN
500.0000 mg | INTRAVENOUS | Status: DC
  Administered 2019-05-11 – 2019-05-14 (×4): 500 mg via INTRAVENOUS
  Filled 2019-05-11 (×4): qty 500

## 2019-05-11 MED ORDER — HYPROMELLOSE (GONIOSCOPIC) 2.5 % OP SOLN
1.0000 [drp] | OPHTHALMIC | Status: DC | PRN
  Administered 2019-05-11: 1 [drp] via OPHTHALMIC
  Filled 2019-05-11 (×2): qty 15

## 2019-05-11 MED ORDER — METHYLPREDNISOLONE SODIUM SUCC 125 MG IJ SOLR
60.0000 mg | Freq: Three times a day (TID) | INTRAMUSCULAR | Status: DC
  Administered 2019-05-11: 60 mg via INTRAVENOUS
  Filled 2019-05-11: qty 2

## 2019-05-11 MED ORDER — BUDESONIDE 0.5 MG/2ML IN SUSP
0.5000 mg | Freq: Two times a day (BID) | RESPIRATORY_TRACT | Status: DC
  Administered 2019-05-12 – 2019-05-14 (×5): 0.5 mg via RESPIRATORY_TRACT
  Filled 2019-05-11 (×6): qty 2

## 2019-05-11 NOTE — Progress Notes (Signed)
PROGRESS NOTE                                                                                                                                                                                                             Patient Demographics:    Virginia Summers, is a 79 y.o. female, DOB - 04-08-1940, OZH:[id number]  Admit date - 05/10/2019   Admitting Physician Pieter Partridge, MD  Outpatient Primary MD for the patient is Eather Colas, FNP  LOS - 1   Chief Complaint  Patient presents with  . Shortness of Breath  . Chest Pain  . Hypertension       Brief Narrative    79 y.o. 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 Elease Hashimoto, and hypothyroidism who was seen in her PCP office today for routine management of difficult to control hypertension.  At that time she admitted to feeling short of breath and was noted to have O2 saturations in the 80s on room air along with audible wheezing.  Patient was sent to the ED for evaluation and treatment, her work-up significant for COPD and CAP.    Subjective:    Virginia Summers today for some dyspnea, cough, productive mainly in the morning, as well she reports headache .    Assessment  & Plan :    Principal Problem:   COPD with acute exacerbation (HCC) Active Problems:   HTN (hypertension)   Pulmonary HTN (HCC)   Hypothyroidism   History of pulmonary embolus (PE)  COPD exacerbation -Patient with significant wheezing, continue with IV steroids, continue with nebs, he remains with significant wheezing will add with desonide. -Patient reports cough, productive,  will start on azithromycin.  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 -Troponins non-ACS pattern 91>75 -Nontypical  HTN - Patient with hypertensive urgency on presentation.   much improved - Continue carvedilol,  chlorthalidone and irbesartan per home doses  LUQ TENDERNESS - resolved, CT abdomen pelvis with no acute finding  H/O VTE - Patient is apparently not on any anticoagulation due to cost per outpatient note by Dr. Elease Hashimoto. - CTA  negative for PE on admission.  ANXIETY AND DEPRESSION - Continue Celexa and trazodone -She appears with significant anxiety, will add low-dose Xanax  as needed  DM2 - Hold metformin, SSI AC at bedtime -BG uncontrolled, this is secondary to steroids, will start on low-dose Lantus.    COVID-19 Labs  No results for input(s): DDIMER, FERRITIN, LDH, CRP in the last 72 hours.  Lab Results  Component Value Date   SARSCOV2NAA NEGATIVE 05/10/2019     Code Status : Full  Family Communication  : None at bedside  Disposition Plan  : Home  Barriers For Discharge : remains hypoxic, with significant wheezing  Consults  :  None  Procedures  : None  DVT Prophylaxis  :  [place] lovenox  Lab Results  Component Value Date   PLT 278 05/11/2019    Antibiotics  :    Anti-infectives (From admission, onward)   None        Objective:   Vitals:   05/10/19 2303 05/11/19 0823 05/11/19 0848 05/11/19 1441  BP: (!) 178/77 (!) 153/71    Pulse: 77 87    Resp: 18 17    Temp: 98.1 F (36.7 C) 98.3 F (36.8 C)    TempSrc: Oral Oral    SpO2: 98% 99% 97% 98%  Weight:      Height:        Wt Readings from Last 3 Encounters:  05/10/19 90 kg  10/09/17 83.6 kg  06/08/16 81.6 kg    No intake or output data in the 24 hours ending 05/11/19 1449   Physical Exam  Awake Alert, Oriented X 3, No new F.N deficits, Normal affect  Symmetrical Chest wall movement, Good air movement bilaterally, with significant wheezing bilaterally RRR,No Gallops,Rubs or new Murmurs, No Parasternal Heave +ve B.Sounds, Abd Soft, No tenderness, No rebound - guarding or rigidity. No Cyanosis, Clubbing or edema, No new Rash or bruise     Data Review:    CBC Recent Labs  Lab  05/10/19 1324 05/10/19 1640 05/11/19 0235  WBC 10.1 10.7* 6.5  HGB 11.0* 11.0* 10.1*  HCT 37.1 37.0 33.3*  PLT 320 298 278  MCV 85.7 85.8 84.5  MCH 25.4* 25.5* 25.6*  MCHC 29.6* 29.7* 30.3  RDW 14.2 14.4 14.3  LYMPHSABS 1.7  --   --   MONOABS 0.6  --   --   EOSABS 0.0  --   --   BASOSABS 0.0  --   --     Chemistries  Recent Labs  Lab 05/10/19 1324 05/11/19 0235  NA 140 137  K 3.7 3.6  CL 101 98  CO2 29 27  GLUCOSE 153* 240*  BUN 16 20  CREATININE 1.18* 0.95  CALCIUM 9.3 8.9   ------------------------------------------------------------------------------------------------------------------ No results for input(s): CHOL, HDL, LDLCALC, TRIG, CHOLHDL, LDLDIRECT in the last 72 hours.  Lab Results  Component Value Date   HGBA1C 6.8 (H) 05/10/2019   ------------------------------------------------------------------------------------------------------------------ Recent Labs    05/10/19 1707  TSH 0.994   ------------------------------------------------------------------------------------------------------------------ No results for input(s): VITAMINB12, FOLATE, FERRITIN, TIBC, IRON, RETICCTPCT in the last 72 hours.  Coagulation profile No results for input(s): INR, PROTIME in the last 168 hours.  No results for input(s): DDIMER in the last 72 hours.  Cardiac Enzymes No results for input(s): CKMB, TROPONINI, MYOGLOBIN in the last 168 hours.  Invalid input(s): CK ------------------------------------------------------------------------------------------------------------------    Component Value Date/Time   BNP 361.0 (H) 05/10/2019 1324    Inpatient Medications  Scheduled Meds: . carvedilol  12.5 mg Oral BID WC  . chlorthalidone  12.5 mg Oral Daily  . citalopram  20 mg Oral Daily  .  enoxaparin (LOVENOX) injection  40 mg Subcutaneous Q24H  . insulin aspart  0-9 Units Subcutaneous TID WC  . ipratropium-albuterol  3 mL Nebulization TID  . irbesartan  300 mg  Oral Daily  . levothyroxine  25 mcg Oral QAC breakfast  . methylPREDNISolone (SOLU-MEDROL) injection  60 mg Intravenous Q8H  . pantoprazole  40 mg Oral Daily  . potassium chloride  10 mEq Oral Daily   Continuous Infusions: PRN Meds:.acetaminophen **OR** acetaminophen, albuterol, ammonium lactate, bisacodyl, polyethylene glycol, traZODone  Micro Results Recent Results (from the past 240 hour(s))  Respiratory Panel by RT PCR (Flu A&B, Covid) - Nasopharyngeal Swab     Status: None   Collection Time: 05/10/19  2:08 PM   Specimen: Nasopharyngeal Swab  Result Value Ref Range Status   SARS Coronavirus 2 by RT PCR NEGATIVE NEGATIVE Final    Comment: (NOTE) SARS-CoV-2 target nucleic acids are NOT DETECTED. The SARS-CoV-2 RNA is generally detectable in upper respiratoy specimens during the acute phase of infection. The lowest concentration of SARS-CoV-2 viral copies this assay can detect is 131 copies/mL. A negative result does not preclude SARS-Cov-2 infection and should not be used as the sole basis for treatment or other patient management decisions. A negative result may occur with  improper specimen collection/handling, submission of specimen other than nasopharyngeal swab, presence of viral mutation(s) within the areas targeted by this assay, and inadequate number of viral copies (<131 copies/mL). A negative result must be combined with clinical observations, patient history, and epidemiological information. The expected result is Negative. Fact Sheet for Patients:  PinkCheek.be Fact Sheet for Healthcare Providers:  GravelBags.it This test is not yet ap proved or cleared by the [place] FDA and  has been authorized for detection and/or diagnosis of SARS-CoV-2 by FDA under an Emergency Use Authorization (EUA). This EUA will remain  in effect (meaning this test can be used) for the duration of the COVID-19 declaration under  Section 564(b)(1) of the Act, 21 U.S.C. section 360bbb-3(b)(1), unless the authorization is terminated or revoked sooner.    Influenza A by PCR NEGATIVE NEGATIVE Final   Influenza B by PCR NEGATIVE NEGATIVE Final    Comment: (NOTE) The Xpert Xpress SARS-CoV-2/FLU/RSV assay is intended as an aid in  the diagnosis of influenza from Nasopharyngeal swab specimens and  should not be used as a sole basis for treatment. Nasal washings and  aspirates are unacceptable for Xpert Xpress SARS-CoV-2/FLU/RSV  testing. Fact Sheet for Patients: PinkCheek.be Fact Sheet for Healthcare Providers: GravelBags.it This test is not yet approved or cleared by the [place] FDA and  has been authorized for detection and/or diagnosis of SARS-CoV-2 by  FDA under an Emergency Use Authorization (EUA). This EUA will remain  in effect (meaning this test can be used) for the duration of the  Covid-19 declaration under Section 564(b)(1) of the Act, 21  U.S.C. section 360bbb-3(b)(1), unless the authorization is  terminated or revoked. Performed at [facility], [place] [address], [place],  [address]     Radiology Reports CT ABDOMEN WO CONTRAST  Result Date: 05/10/2019 CLINICAL DATA:  Left upper quadrant abdominal pain. EXAM: CT ABDOMEN WITHOUT CONTRAST TECHNIQUE: Multidetector CT imaging of the abdomen was performed following the standard protocol without IV contrast. COMPARISON:  None. FINDINGS: Lower chest: The lung bases are clear. The heart size is normal. Hepatobiliary: The liver is normal. Normal gallbladder.There is no biliary ductal dilation. Pancreas: Normal contours without ductal dilatation. No peripancreatic fluid collection. Spleen: No splenic  laceration or hematoma. Adrenals/Urinary Tract: --Adrenal glands: No adrenal hemorrhage. --Right kidney/ureter: No hydronephrosis or perinephric hematoma. --Left kidney/ureter: There is an  indeterminate 2.8 cm hypoattenuating structure in the interpolar region of the left kidney. This is favored to represent a proteinaceous or hemorrhagic cyst as seen on the patient's prior abdominal ultrasound. --Urinary bladder: Outside the field of view. Stomach/Bowel: --Stomach/Duodenum: No hiatal hernia or other gastric abnormality. Normal duodenal course and caliber. --Small bowel: No dilatation or inflammation. --Colon: There is scattered colonic diverticula without CT evidence for diverticulitis. --Appendix: Outside the field of view. Vascular/Lymphatic: Atherosclerotic calcification is present within the non-aneurysmal abdominal aorta, without hemodynamically significant stenosis. --No retroperitoneal lymphadenopathy. --No mesenteric lymphadenopathy. Other: No ascites or free air. The abdominal wall is normal. Musculoskeletal. No acute displaced fractures. IMPRESSION: 1. No acute abnormality identified to explain the patient's left upper quadrant abdominal pain. 2. Scattered colonic diverticula without CT evidence for diverticulitis. Electronically Signed   By: Katherine Mantle M.D.   On: 05/10/2019 22:50   CT Angio Chest PE W and/or Wo Contrast  Result Date: 05/10/2019 CLINICAL DATA:  Shortness of breath and chest pain. History of pulmonary embolus. EXAM: CT ANGIOGRAPHY CHEST WITH CONTRAST TECHNIQUE: Multidetector CT imaging of the chest was performed using the standard protocol during bolus administration of intravenous contrast. Multiplanar CT image reconstructions and MIPs were obtained to evaluate the vascular anatomy. CONTRAST:  75 mL OMNIPAQUE IOHEXOL 350 MG/ML SOLN COMPARISON:  CT chest 05/26/2010. Single-view of the chest earlier today. FINDINGS: Cardiovascular: Satisfactory opacification of the pulmonary arteries to the segmental level. No evidence of pulmonary embolism. Normal heart size. No pericardial effusion. Aortic atherosclerosis noted. Mediastinum/Nodes: No enlarged mediastinal, hilar,  or axillary lymph nodes. Thyroid gland, trachea, and esophagus demonstrate no significant findings. A small hiatal hernia is seen. Lungs/Pleura: No pleural effusion. Mild dependent atelectasis. The lungs are otherwise clear. Upper Abdomen: Negative. Musculoskeletal: No acute or focal bony abnormality. Review of the MIP images confirms the above findings. IMPRESSION: 1. Negative for pulmonary embolus. No acute disease. 2. Small hiatal hernia. Aortic Atherosclerosis (ICD10-I70.0). Electronically Signed   By: Drusilla Kanner M.D.   On: 05/10/2019 15:05   DG Chest Port 1 View  Result Date: 05/10/2019 CLINICAL DATA:  Shortness of breath for 3 weeks EXAM: PORTABLE CHEST 1 VIEW COMPARISON:  09/26/2018 FINDINGS: There is bibasilar hazy airspace disease concerning for atelectasis versus pneumonia. There is no pleural effusion or pneumothorax. The heart and mediastinal contours are unremarkable. There is no acute osseous abnormality. IMPRESSION: Bibasilar hazy airspace disease concerning for atelectasis versus pneumonia. Electronically Signed   By: Elige Ko   On: 05/10/2019 14:16     Huey Bienenstock M.D on 05/11/2019 at 2:49 PM  Between 7am to 7pm - Pager - [phone]  After 7pm go to [email or web address] - password [facility]  [facility] -  Office  [phone]

## 2019-05-12 ENCOUNTER — Inpatient Hospital Stay (HOSPITAL_COMMUNITY): Payer: Medicare HMO

## 2019-05-12 DIAGNOSIS — J9601 Acute respiratory failure with hypoxia: Secondary | ICD-10-CM

## 2019-05-12 LAB — BASIC METABOLIC PANEL
Anion gap: 10 (ref 5–15)
BUN: 28 mg/dL — ABNORMAL HIGH (ref 8–23)
CO2: 29 mmol/L (ref 22–32)
Calcium: 9.2 mg/dL (ref 8.9–10.3)
Chloride: 97 mmol/L — ABNORMAL LOW (ref 98–111)
Creatinine, Ser: 1.08 mg/dL — ABNORMAL HIGH (ref 0.44–1.00)
GFR calc Af Amer: 57 mL/min — ABNORMAL LOW (ref >60)
GFR calc non Af Amer: 49 mL/min — ABNORMAL LOW (ref >60)
Glucose, Bld: 214 mg/dL — ABNORMAL HIGH (ref 70–99)
Potassium: 4.4 mmol/L (ref 3.5–5.1)
Sodium: 136 mmol/L (ref 135–145)

## 2019-05-12 LAB — GLUCOSE, CAPILLARY
Glucose-Capillary: 195 mg/dL — ABNORMAL HIGH (ref 70–99)
Glucose-Capillary: 193 mg/dL — ABNORMAL HIGH (ref 70–99)
Glucose-Capillary: 194 mg/dL — ABNORMAL HIGH (ref 70–99)
Glucose-Capillary: 237 mg/dL — ABNORMAL HIGH (ref 70–99)

## 2019-05-12 LAB — CBC
HCT: 33.6 % — ABNORMAL LOW (ref 36.0–46.0)
Hemoglobin: 10 g/dL — ABNORMAL LOW (ref 12.0–15.0)
MCH: 25.6 pg — ABNORMAL LOW (ref 26.0–34.0)
MCHC: 29.8 g/dL — ABNORMAL LOW (ref 30.0–36.0)
MCV: 85.9 fL (ref 80.0–100.0)
Platelets: 308 10*3/uL (ref 150–400)
RBC: 3.91 MIL/uL (ref 3.87–5.11)
RDW: 14.6 % (ref 11.5–15.5)
WBC: 12 10*3/uL — ABNORMAL HIGH (ref 4.0–10.5)
nRBC: 0 % (ref 0.0–0.2)

## 2019-05-12 LAB — BRAIN NATRIURETIC PEPTIDE: B Natriuretic Peptide: 293 pg/mL — ABNORMAL HIGH (ref 0.0–100.0)

## 2019-05-12 MED ORDER — HYDRALAZINE HCL 20 MG/ML IJ SOLN
5.0000 mg | Freq: Four times a day (QID) | INTRAMUSCULAR | Status: DC | PRN
  Administered 2019-05-12: 5 mg via INTRAVENOUS
  Filled 2019-05-12: qty 1

## 2019-05-12 MED ORDER — AMLODIPINE 1 MG/ML ORAL SUSPENSION
5.0000 mg | Freq: Every day | ORAL | Status: DC
  Filled 2019-05-12: qty 5

## 2019-05-12 MED ORDER — AMLODIPINE BESYLATE 5 MG PO TABS
5.0000 mg | ORAL_TABLET | Freq: Once | ORAL | Status: AC
  Administered 2019-05-12: 5 mg via ORAL
  Filled 2019-05-12: qty 1

## 2019-05-12 MED ORDER — AMLODIPINE BESYLATE 10 MG PO TABS
10.0000 mg | ORAL_TABLET | Freq: Every day | ORAL | Status: DC
  Administered 2019-05-13 – 2019-05-14 (×2): 10 mg via ORAL
  Filled 2019-05-12 (×2): qty 1

## 2019-05-12 MED ORDER — FUROSEMIDE 10 MG/ML IJ SOLN
40.0000 mg | Freq: Four times a day (QID) | INTRAMUSCULAR | Status: AC
  Administered 2019-05-12 (×2): 40 mg via INTRAVENOUS
  Filled 2019-05-12 (×2): qty 4

## 2019-05-12 MED ORDER — LORATADINE 10 MG PO TABS
10.0000 mg | ORAL_TABLET | Freq: Every day | ORAL | Status: DC
  Administered 2019-05-12 – 2019-05-14 (×3): 10 mg via ORAL
  Filled 2019-05-12 (×2): qty 1

## 2019-05-12 MED ORDER — AMLODIPINE BESYLATE 5 MG PO TABS
5.0000 mg | ORAL_TABLET | Freq: Every day | ORAL | Status: DC
  Administered 2019-05-12: 5 mg via ORAL

## 2019-05-12 MED ORDER — GUAIFENESIN ER 600 MG PO TB12
1200.0000 mg | ORAL_TABLET | Freq: Two times a day (BID) | ORAL | Status: DC
  Administered 2019-05-12 – 2019-05-14 (×5): 1200 mg via ORAL
  Filled 2019-05-12 (×5): qty 2

## 2019-05-12 MED ORDER — IPRATROPIUM-ALBUTEROL 0.5-2.5 (3) MG/3ML IN SOLN
3.0000 mL | Freq: Four times a day (QID) | RESPIRATORY_TRACT | Status: DC
  Administered 2019-05-12 – 2019-05-14 (×10): 3 mL via RESPIRATORY_TRACT
  Filled 2019-05-12 (×11): qty 3

## 2019-05-12 NOTE — Evaluation (Addendum)
Physical Therapy Evaluation Patient Details Name: Virginia Summers MRN: [id number] DOB: 30-Dec-1940 Today's Date: 05/12/2019   History of Present Illness  Pt is a 79 y.o. female admitted 05/10/19 with SOB, hypxoia and difficult to control HTN. Work-up significant for COPD exacerbation and CAP. CT negative for PE. PMH includes HTN, COPD, DM2, anxiety, depression.    Clinical Impression  Pt presents with an overall decrease in functional mobility secondary to above. PTA, pt independent and lives alone; supportive daughters live nearby, pt may stay with one at d/c for initial assist. Today, pt limited by generalized weakness, SOB and decreased activity tolerance. Trialled gait training with and without DME, pt requiring intermittent minA to maintain balance. SpO2 down to 85% on RA, maintaining >/92% on 2L O2 (see saturations qualifications note). Pt would benefit from continued acute PT services to maximize functional mobility and independence prior to d/c with HHPT services.    Follow Up Recommendations Home health PT;Supervision for mobility/OOB    Equipment Recommendations  (TBD - rollator vs. rolling walker)    Recommendations for Other Services       Precautions / Restrictions Precautions Precautions: Fall;Other (comment) Precaution Comments: Watch SpO2 Restrictions Weight Bearing Restrictions: No      Mobility  Bed Mobility Overal bed mobility: Modified Independent             General bed mobility comments: HOB elevated  Transfers Overall transfer level: Needs assistance Equipment used: None;Rolling walker (2 wheeled) Transfers: Sit to/from Stand Sit to Stand: Supervision            Ambulation/Gait Ambulation/Gait assistance: Min guard;Min assist Gait Distance (Feet): 100 Feet Assistive device: None;1 person hand held assist;Rolling walker (2 wheeled) Gait Pattern/deviations: Step-through pattern;Decreased stride length;Staggering right;Staggering left Gait  velocity: Decreased   General Gait Details: Performed 3x bouts of gait training with seated rest in between due to SOB, fatigue and/or c/o dizziness; initially pt walking without DME, requiring close min guard and 2x minA to prevent LOB; trialled with RW which pt reports "unstable"; final gait trial without DME, no LOB with close min guard; SpO2 down to 85% on RA, maintaining >/92% on 2L  Stairs            Wheelchair Mobility    Modified Rankin (Stroke Patients Only)       Balance Overall balance assessment: Needs assistance   Sitting balance-Leahy Scale: Fair Sitting balance - Comments: Assist to don bilateral socks, pt reports can typically reach feet but belly is currently swollen     Standing balance-Leahy Scale: Fair                               Pertinent Vitals/Pain Pain Assessment: No/denies pain    Home Living Family/patient expects to be discharged to:: Private residence Living Arrangements: Alone Available Help at Discharge: Family;Available PRN/intermittently Type of Home: House Home Access: Stairs to enter Entrance Stairs-Rails: Right Entrance Stairs-Number of Steps: 3-4 Home Layout: One level Home Equipment: Shower seat Additional Comments: 3 daughters live nearby but work during day; pt may or may not stay with one for a few days, or one of them stay with her    Prior Function Level of Independence: Independent               Hand Dominance        Extremity/Trunk Assessment   Upper Extremity Assessment Upper Extremity Assessment: Overall WFL for tasks assessed  Lower Extremity Assessment Lower Extremity Assessment: Generalized weakness       Communication   Communication: No difficulties  Cognition Arousal/Alertness: Awake/alert Behavior During Therapy: WFL for tasks assessed/performed Overall Cognitive Status: Within Functional Limits for tasks assessed                                 General  Comments: WFL for simple tasks; although talkative with poor attention related to this, required frequent redirection to conversation      General Comments      Exercises     Assessment/Plan    PT Assessment Patient needs continued PT services  PT Problem List Decreased strength;Decreased activity tolerance;Decreased balance;Decreased mobility;Decreased knowledge of use of DME;Cardiopulmonary status limiting activity       PT Treatment Interventions DME instruction;Gait training;Stair training;Functional mobility training;Therapeutic activities;Therapeutic exercise;Balance training;Patient/family education    PT Goals (Current goals can be found in the Care Plan section)  Acute Rehab PT Goals Patient Stated Goal: Return home and keep independence PT Goal Formulation: With patient Time For Goal Achievement: 05/26/19 Potential to Achieve Goals: Good    Frequency Min 3X/week   Barriers to discharge Decreased caregiver support      Co-evaluation               AM-PAC PT "6 Clicks" Mobility  Outcome Measure Help needed turning from your back to your side while in a flat bed without using bedrails?: None Help needed moving from lying on your back to sitting on the side of a flat bed without using bedrails?: None Help needed moving to and from a bed to a chair (including a wheelchair)?: A Little Help needed standing up from a chair using your arms (e.g., wheelchair or bedside chair)?: None Help needed to walk in hospital room?: A Little Help needed climbing 3-5 steps with a railing? : A Little 6 Click Score: 21    End of Session Equipment Utilized During Treatment: Gait belt;Oxygen Activity Tolerance: Patient tolerated treatment well Patient left: in chair;with call bell/phone within reach Nurse Communication: Mobility status PT Visit Diagnosis: Other abnormalities of gait and mobility (R26.89);Muscle weakness (generalized) (M62.81)    Time: 6644-0347 PT Time  Calculation (min) (ACUTE ONLY): 27 min   Charges:   PT Evaluation $PT Eval Moderate Complexity: 1 Mod PT Treatments $Gait Training: 8-22 mins   Ina Homes, PT, DPT Acute Rehabilitation Services  Pager [phone] Office [phone]  Malachy Chamber 05/12/2019, 11:53 AM

## 2019-05-12 NOTE — Telephone Encounter (Signed)
Agree with note by Michelle Swinyer, RN  

## 2019-05-12 NOTE — Progress Notes (Signed)
PROGRESS NOTE                                                                                                                                                                                                             Patient Demographics:    Virginia Summers, is a 79 y.o. female, DOB - [birth year], JJO:[id number]  Admit date - 05/10/2019   Admitting Physician Virginia Hey, MD  Outpatient Primary MD for the patient is Virginia Chalk, FNP  LOS - 2   Chief Complaint  Patient presents with  . Shortness of Breath  . Chest Pain  . Hypertension       Brief Narrative    79 y.o. 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 Acie Summers, and hypothyroidism who was seen in her PCP office today for routine management of difficult to control hypertension.  At that time she admitted to feeling short of breath and was noted to have O2 saturations in the 80s on room air along with audible wheezing.  Patient was sent to the ED for evaluation and treatment, her work-up significant for COPD .   Subjective:    Virginia Summers today for some dyspnea, cough, productive mainly in the morning, as well she reports headache .    Assessment  & Plan :    Principal Problem:   COPD with acute exacerbation ([place]) Active Problems:   HTN (hypertension)   Pulmonary HTN ([facility])   Hypothyroidism   History of pulmonary embolus (PE)  Acute hypoxic respiratory failure/COPD exacerbation/acute bronchitis -Patient with no oxygen requirement at baseline, currently requiring oxygen mainly with exertion. -He is with significant wheezing, continue with IV steroids, continue with nebs, I have added budesonide as well. -Continue with azithromycin given her cough is productive. -Was encouraged use incentive spirometry and flutter valve. -Will start on Mucinex and  Claritin. -She is on chronic prednisone, currently being tapered by her PCP.  Chronic diastolic CHF -Was recently COVID-69 significant for stable EF, but grade 1 diastolic CHF, she had elevated BNP on admission, will give Lasix 40 mg IV every 6 hours, will repeat echo as well to see if this helps with her hypoxia.   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 -Troponins non-ACS pattern  91>75 -Follow on repeat echo  Diplopia -Reports diplopia, will check MRI of the brain  HTN - Patient with hypertensive urgency on presentation.   Remains elevated, despite resuming home Coreg, chlorthalidone and irbesartan, I have added Norvasc, still elevated, will increase Norvasc to 10 mg oral daily and keep on as needed hydralazine as well.  LUQ TENDERNESS - resolved, CT abdomen pelvis with no acute finding  H/O VTE - Patient is apparently not on any anticoagulation due to cost per outpatient note by Dr. Elease Summers. - CTA  negative for PE on admission.  ANXIETY AND DEPRESSION - Continue Celexa and trazodone -She appears with significant anxiety, will add low-dose Xanax as needed  DM2 - Hold metformin, SSI AC at bedtime -CBG uncontrolled, this is secondary to steroids, continue with sliding scale and low-dose Lantus    COVID-19 Labs  No results for input(s): DDIMER, FERRITIN, LDH, CRP in the last 72 hours.  Lab Results  Component Value Date   SARSCOV2NAA NEGATIVE 05/10/2019     Code Status : Full  Family Communication  : None at bedside, patient is awake, alert and coherent, discussed at length with the patient  Disposition Plan  : Home  Barriers For Discharge : remains hypoxic, with significant wheezing  Consults  :  None  Procedures  : None  DVT Prophylaxis  :  [place] lovenox  Lab Results  Component Value Date   PLT 308 05/12/2019    Antibiotics  :    Anti-infectives (From admission, onward)   Start     Dose/Rate Route Frequency Ordered Stop   05/11/19 1500   azithromycin (ZITHROMAX) 500 mg in sodium chloride 0.9 % 250 mL IVPB     500 mg 250 mL/hr over 60 Minutes Intravenous Every 24 hours 05/11/19 1455     05/11/19 1500  cefTRIAXone (ROCEPHIN) 1 g in sodium chloride 0.9 % 100 mL IVPB  Status:  Discontinued     1 g 200 mL/hr over 30 Minutes Intravenous Every 24 hours 05/11/19 1455 05/11/19 1504        Objective:   Vitals:   05/11/19 2243 05/12/19 0804 05/12/19 0837 05/12/19 1159  BP: (!) 156/68 (!) 180/77    Pulse: 73 72    Resp: 18 18    Temp: 98.4 F (36.9 C) 99.4 F (37.4 C)    TempSrc: Oral     SpO2: 97% 96% 95% 96%  Weight:      Height:        Wt Readings from Last 3 Encounters:  05/10/19 90 kg  10/09/17 83.6 kg  06/08/16 81.6 kg     Intake/Output Summary (Last 24 hours) at 05/12/2019 1336 Last data filed at 05/12/2019 0701 Gross per 24 hour  Intake 740 ml  Output 1700 ml  Net -960 ml     Physical Exam  Awake Alert, Oriented X 3, No new F.N deficits, Normal affect Symmetrical Chest wall movement, Good air movement bilaterally, remains with significant wheezing, but has improved RRR,No Gallops,Rubs or new Murmurs, No Parasternal Heave +ve B.Sounds, Abd Soft, No tenderness, No rebound - guarding or rigidity. No Cyanosis, Clubbing or edema, No new Rash or bruise       Data Review:    CBC Recent Labs  Lab 05/10/19 1324 05/10/19 1640 05/11/19 0235 05/12/19 0256  WBC 10.1 10.7* 6.5 12.0*  HGB 11.0* 11.0* 10.1* 10.0*  HCT 37.1 37.0 33.3* 33.6*  PLT 320 298 278 308  MCV 85.7 85.8 84.5 85.9  MCH 25.4*  25.5* 25.6* 25.6*  MCHC 29.6* 29.7* 30.3 29.8*  RDW 14.2 14.4 14.3 14.6  LYMPHSABS 1.7  --   --   --   MONOABS 0.6  --   --   --   EOSABS 0.0  --   --   --   BASOSABS 0.0  --   --   --     Chemistries  Recent Labs  Lab 05/10/19 1324 05/11/19 0235 05/12/19 0256  NA 140 137 136  K 3.7 3.6 4.4  CL 101 98 97*  CO2 29 27 29   GLUCOSE 153* 240* 214*  BUN 16 20 28*  CREATININE 1.18* 0.95 1.08*    CALCIUM 9.3 8.9 9.2   ------------------------------------------------------------------------------------------------------------------ No results for input(s): CHOL, HDL, LDLCALC, TRIG, CHOLHDL, LDLDIRECT in the last 72 hours.  Lab Results  Component Value Date   HGBA1C 6.8 (H) 05/10/2019   ------------------------------------------------------------------------------------------------------------------ Recent Labs    05/10/19 1707  TSH 0.994   ------------------------------------------------------------------------------------------------------------------ No results for input(s): VITAMINB12, FOLATE, FERRITIN, TIBC, IRON, RETICCTPCT in the last 72 hours.  Coagulation profile No results for input(s): INR, PROTIME in the last 168 hours.  No results for input(s): DDIMER in the last 72 hours.  Cardiac Enzymes No results for input(s): CKMB, TROPONINI, MYOGLOBIN in the last 168 hours.  Invalid input(s): CK ------------------------------------------------------------------------------------------------------------------    Component Value Date/Time   BNP 361.0 (H) 05/10/2019 1324    Inpatient Medications  Scheduled Meds: . amLODipine  5 mg Oral Daily  . budesonide (PULMICORT) nebulizer solution  0.5 mg Nebulization BID  . carvedilol  12.5 mg Oral BID WC  . chlorthalidone  12.5 mg Oral Daily  . citalopram  20 mg Oral Daily  . enoxaparin (LOVENOX) injection  40 mg Subcutaneous Q24H  . guaiFENesin  1,200 mg Oral BID  . insulin aspart  0-9 Units Subcutaneous TID WC  . insulin glargine  6 Units Subcutaneous Daily  . ipratropium-albuterol  3 mL Nebulization QID  . irbesartan  300 mg Oral Daily  . levothyroxine  25 mcg Oral QAC breakfast  . methylPREDNISolone (SOLU-MEDROL) injection  40 mg Intravenous Q8H  . pantoprazole  40 mg Oral Daily  . potassium chloride  10 mEq Oral Daily   Continuous Infusions: . azithromycin 500 mg (05/11/19 1527)   PRN Meds:.acetaminophen  **OR** acetaminophen, albuterol, ALPRAZolam, ammonium lactate, bisacodyl, butalbital-acetaminophen-caffeine, hydroxypropyl methylcellulose / hypromellose, polyethylene glycol, traZODone  Micro Results Recent Results (from the past 240 hour(s))  Respiratory Panel by RT PCR (Flu A&B, Covid) - Nasopharyngeal Swab     Status: None   Collection Time: 05/10/19  2:08 PM   Specimen: Nasopharyngeal Swab  Result Value Ref Range Status   SARS Coronavirus 2 by RT PCR NEGATIVE NEGATIVE Final    Comment: (NOTE) SARS-CoV-2 target nucleic acids are NOT DETECTED. The SARS-CoV-2 RNA is generally detectable in upper respiratoy specimens during the acute phase of infection. The lowest concentration of SARS-CoV-2 viral copies this assay can detect is 131 copies/mL. A negative result does not preclude SARS-Cov-2 infection and should not be used as the sole basis for treatment or other patient management decisions. A negative result may occur with  improper specimen collection/handling, submission of specimen other than nasopharyngeal swab, presence of viral mutation(s) within the areas targeted by this assay, and inadequate number of viral copies (<131 copies/mL). A negative result must be combined with clinical observations, patient history, and epidemiological information. The expected result is Negative. Fact Sheet for Patients:  05/12/19 Fact Sheet for Healthcare Providers:  https://www.young.biz/ This test is not yet ap proved or cleared by the [place] and  has been authorized for detection and/or diagnosis of SARS-CoV-2 by FDA under an Emergency Use Authorization (EUA). This EUA will remain  in effect (meaning this test can be used) for the duration of the COVID-19 declaration under Section 564(b)(1) of the Act, 21 U.S.C. section 360bbb-3(b)(1), unless the authorization is terminated or revoked sooner.    Influenza A by PCR NEGATIVE  NEGATIVE Final   Influenza B by PCR NEGATIVE NEGATIVE Final    Comment: (NOTE) The Xpert Xpress SARS-CoV-2/FLU/RSV assay is intended as an aid in  the diagnosis of influenza from Nasopharyngeal swab specimens and  should not be used as a sole basis for treatment. Nasal washings and  aspirates are unacceptable for Xpert Xpress SARS-CoV-2/FLU/RSV  testing. Fact Sheet for Patients: https://www.moore.com/ Fact Sheet for Healthcare Providers: https://www.young.biz/ This test is not yet approved or cleared by the [place] FDA and  has been authorized for detection and/or diagnosis of SARS-CoV-2 by  FDA under an Emergency Use Authorization (EUA). This EUA will remain  in effect (meaning this test can be used) for the duration of the  Covid-19 declaration under Section 564(b)(1) of the Act, 21  U.S.C. section 360bbb-3(b)(1), unless the authorization is  terminated or revoked. Performed at [facility], [address] [place], [place] [address]     Radiology Reports CT ABDOMEN WO CONTRAST  Result Date: 05/10/2019 CLINICAL DATA:  Left upper quadrant abdominal pain. EXAM: CT ABDOMEN WITHOUT CONTRAST TECHNIQUE: Multidetector CT imaging of the abdomen was performed following the standard protocol without IV contrast. COMPARISON:  None. FINDINGS: Lower chest: The lung bases are clear. The heart size is normal. Hepatobiliary: The liver is normal. Normal gallbladder.There is no biliary ductal dilation. Pancreas: Normal contours without ductal dilatation. No peripancreatic fluid collection. Spleen: No splenic laceration or hematoma. Adrenals/Urinary Tract: --Adrenal glands: No adrenal hemorrhage. --Right kidney/ureter: No hydronephrosis or perinephric hematoma. --Left kidney/ureter: There is an indeterminate 2.8 cm hypoattenuating structure in the interpolar region of the left kidney. This is favored to represent a proteinaceous or hemorrhagic cyst as seen  on the patient's prior abdominal ultrasound. --Urinary bladder: Outside the field of view. Stomach/Bowel: --Stomach/Duodenum: No hiatal hernia or other gastric abnormality. Normal duodenal course and caliber. --Small bowel: No dilatation or inflammation. --Colon: There is scattered colonic diverticula without CT evidence for diverticulitis. --Appendix: Outside the field of view. Vascular/Lymphatic: Atherosclerotic calcification is present within the non-aneurysmal abdominal aorta, without hemodynamically significant stenosis. --No retroperitoneal lymphadenopathy. --No mesenteric lymphadenopathy. Other: No ascites or free air. The abdominal wall is normal. Musculoskeletal. No acute displaced fractures. IMPRESSION: 1. No acute abnormality identified to explain the patient's left upper quadrant abdominal pain. 2. Scattered colonic diverticula without CT evidence for diverticulitis. Electronically Signed   By: Katherine Mantle M.D.   On: 05/10/2019 22:50   CT Angio Chest PE W and/or Wo Contrast  Result Date: 05/10/2019 CLINICAL DATA:  Shortness of breath and chest pain. History of pulmonary embolus. EXAM: CT ANGIOGRAPHY CHEST WITH CONTRAST TECHNIQUE: Multidetector CT imaging of the chest was performed using the standard protocol during bolus administration of intravenous contrast. Multiplanar CT image reconstructions and MIPs were obtained to evaluate the vascular anatomy. CONTRAST:  75 mL OMNIPAQUE IOHEXOL 350 MG/ML SOLN COMPARISON:  CT chest 05/26/2010. Single-view of the chest earlier today. FINDINGS: Cardiovascular: Satisfactory opacification of the pulmonary arteries to the segmental level. No evidence of pulmonary embolism. Normal heart size.  No pericardial effusion. Aortic atherosclerosis noted. Mediastinum/Nodes: No enlarged mediastinal, hilar, or axillary lymph nodes. Thyroid gland, trachea, and esophagus demonstrate no significant findings. A small hiatal hernia is seen. Lungs/Pleura: No pleural  effusion. Mild dependent atelectasis. The lungs are otherwise clear. Upper Abdomen: Negative. Musculoskeletal: No acute or focal bony abnormality. Review of the MIP images confirms the above findings. IMPRESSION: 1. Negative for pulmonary embolus. No acute disease. 2. Small hiatal hernia. Aortic Atherosclerosis (ICD10-I70.0). Electronically Signed   By: Drusilla Kanner M.D.   On: 05/10/2019 15:05   DG Chest Port 1 View  Result Date: 05/10/2019 CLINICAL DATA:  Shortness of breath for 3 weeks EXAM: PORTABLE CHEST 1 VIEW COMPARISON:  09/26/2018 FINDINGS: There is bibasilar hazy airspace disease concerning for atelectasis versus pneumonia. There is no pleural effusion or pneumothorax. The heart and mediastinal contours are unremarkable. There is no acute osseous abnormality. IMPRESSION: Bibasilar hazy airspace disease concerning for atelectasis versus pneumonia. Electronically Signed   By: Elige Ko   On: 05/10/2019 14:16     Huey Bienenstock M.D on 05/12/2019 at 1:36 PM  Between 7am to 7pm - Pager - [phone]  After 7pm go to [email or web address] - password [facility]  [facility] -  Office  [phone]

## 2019-05-12 NOTE — Progress Notes (Signed)
SATURATION QUALIFICATIONS: (This note is used to comply with regulatory documentation for home oxygen)  Patient Saturations on Room Air at Rest = 90%  Patient Saturations on Room Air while Ambulating = 85%  Patient Saturations on 2 Liters of oxygen while Ambulating = 92%  Please briefly explain why patient needs home oxygen: Pt required supplemental oxygen to maintain SpO2 >/88% with ambulation.  Ina Homes, PT, DPT Acute Rehabilitation Services  Pager [phone] Office [phone]

## 2019-05-13 ENCOUNTER — Inpatient Hospital Stay (HOSPITAL_COMMUNITY): Payer: Medicare HMO

## 2019-05-13 DIAGNOSIS — J441 Chronic obstructive pulmonary disease with (acute) exacerbation: Principal | ICD-10-CM

## 2019-05-13 DIAGNOSIS — R0602 Shortness of breath: Secondary | ICD-10-CM

## 2019-05-13 LAB — CBC
HCT: 35.5 % — ABNORMAL LOW (ref 36.0–46.0)
Hemoglobin: 10.7 g/dL — ABNORMAL LOW (ref 12.0–15.0)
MCH: 25.5 pg — ABNORMAL LOW (ref 26.0–34.0)
MCHC: 30.1 g/dL (ref 30.0–36.0)
MCV: 84.7 fL (ref 80.0–100.0)
Platelets: 322 10*3/uL (ref 150–400)
RBC: 4.19 MIL/uL (ref 3.87–5.11)
RDW: 14.5 % (ref 11.5–15.5)
WBC: 11 10*3/uL — ABNORMAL HIGH (ref 4.0–10.5)
nRBC: 0 % (ref 0.0–0.2)

## 2019-05-13 LAB — GLUCOSE, CAPILLARY
Glucose-Capillary: 163 mg/dL — ABNORMAL HIGH (ref 70–99)
Glucose-Capillary: 176 mg/dL — ABNORMAL HIGH (ref 70–99)
Glucose-Capillary: 213 mg/dL — ABNORMAL HIGH (ref 70–99)
Glucose-Capillary: 318 mg/dL — ABNORMAL HIGH (ref 70–99)

## 2019-05-13 LAB — BASIC METABOLIC PANEL
Anion gap: 14 (ref 5–15)
BUN: 43 mg/dL — ABNORMAL HIGH (ref 8–23)
CO2: 30 mmol/L (ref 22–32)
Calcium: 9.5 mg/dL (ref 8.9–10.3)
Chloride: 96 mmol/L — ABNORMAL LOW (ref 98–111)
Creatinine, Ser: 1.25 mg/dL — ABNORMAL HIGH (ref 0.44–1.00)
GFR calc Af Amer: 47 mL/min — ABNORMAL LOW (ref >60)
GFR calc non Af Amer: 41 mL/min — ABNORMAL LOW (ref >60)
Glucose, Bld: 180 mg/dL — ABNORMAL HIGH (ref 70–99)
Potassium: 4 mmol/L (ref 3.5–5.1)
Sodium: 140 mmol/L (ref 135–145)

## 2019-05-13 LAB — ECHOCARDIOGRAM COMPLETE
Height: 59 in
Weight: 3047.64 oz

## 2019-05-13 LAB — BRAIN NATRIURETIC PEPTIDE: B Natriuretic Peptide: 216.3 pg/mL — ABNORMAL HIGH (ref 0.0–100.0)

## 2019-05-13 MED ORDER — INSULIN GLARGINE 100 UNIT/ML ~~LOC~~ SOLN
12.0000 [IU] | Freq: Every day | SUBCUTANEOUS | Status: DC
  Administered 2019-05-13 – 2019-05-14 (×2): 12 [IU] via SUBCUTANEOUS
  Filled 2019-05-13 (×2): qty 0.12

## 2019-05-13 MED ORDER — FUROSEMIDE 10 MG/ML IJ SOLN
40.0000 mg | Freq: Once | INTRAMUSCULAR | Status: AC
  Administered 2019-05-13: 40 mg via INTRAVENOUS
  Filled 2019-05-13: qty 4

## 2019-05-13 MED ORDER — POLYVINYL ALCOHOL 1.4 % OP SOLN
1.0000 [drp] | OPHTHALMIC | Status: DC | PRN
  Filled 2019-05-13 (×2): qty 15

## 2019-05-13 NOTE — Progress Notes (Signed)
  Echocardiogram 2D Echocardiogram has been performed.  Pieter Partridge 05/13/2019, 11:54 AM

## 2019-05-13 NOTE — Progress Notes (Addendum)
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 Elease Hashimoto, and hypothyroidism who was seen in her PCP office today for routine management of difficult to control hypertension. At that time she admitted to feeling short of breath and was noted to have O2 saturations in the 80s on room air along with audible wheezing. Patient was sent to the ED for evaluation and treatment, her work-up significant for COPD .  Assessment & Plan:   Principal Problem:   COPD with acute exacerbation (HCC) Active Problems:   HTN (hypertension)   Pulmonary HTN (HCC)   Hypothyroidism   History of pulmonary embolus (PE)   Acute hypoxic respiratory failure/COPD exacerbation/chronic bronchitis -Patient with no oxygen requirement at baseline, currently requiring oxygen mainly with exertion. -No wheezes or crackles on my exam today.  Continue with IV steroids, duo nebs, azithromycin and budesonide.  Not using enough of the spirometry.  Encouraged that. -Continue with azithromycin given her cough is productive. -She is on chronic prednisone, currently being tapered by her PCP.  Chronic diastolic CHF -History of grade 1 diastolic CHF, she had elevated BNP on admission, received 2 doses of Lasix 40 mg IV every 6 hours yesterday.  Repeat echo pending.  Since she feels better than yesterday so I will give her 1 more dose of IV Lasix 40 today.  Her BNP has improved.   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 -Troponins non-ACS pattern 91>75 -Follow on repeat echo  Diplopia: Reported diplopia yesterday but did not mention it to me.  MRI negative for acute stroke but positive for possible PRES. no headaches.  Hypertensive urgency: - Patient with hypertensive urgency  on presentation.   Remained elevated, despite resuming home Coreg, chlorthalidone and irbesartan. Norvasc added and then increased to 10 mg.  Blood pressure much better now.  Continue current regimen.  LUQ TENDERNESS - resolved, CT abdomen pelvis with no acute finding  H/O VTE - Patient is apparently not on any anticoagulation due to cost per outpatient note by Dr. Elease Hashimoto. - CTA  negative for PE on admission.  ANXIETY AND DEPRESSION - Continue Celexa and trazodone -She appears with significant anxiety, will add low-dose Xanax as needed  DM2 - Hold metformin, SSI AC at bedtime -CBG uncontrolled, this is secondary to steroids, continue with sliding scale and increase Lantus to 12 units.  DVT prophylaxis: Lovenox Code Status: Full code Family Communication:  None present at bedside.  Plan of care discussed with patient in length and he verbalized understanding and agreed with it. Patient is from: Home Disposition Plan: Home with home health Barriers to discharge: Symptomatic and oxygen requirement and echo pending   Estimated body mass index is 38.47 kg/m as calculated from the following:   Height as of this encounter: 4\' 11"  (1.499 m).   Weight as of this encounter: 86.4 kg.      Nutritional status:            Consultants:   None  Procedures:   None  Antimicrobials:   None   Subjective: Seen and examined.  Breathing feels better but still has significant shortness of breath and feels weak.  Objective: Vitals:   05/13/19 0500 05/13/19 0759 05/13/19 0824 05/13/19 1222  BP:  (!) 154/62    Pulse:  68  Resp:      Temp:  97.8 F (36.6 C)    TempSrc:      SpO2:  97% 96% 97%  Weight: 86.4 kg     Height:        Intake/Output Summary (Last 24 hours) at 05/13/2019 1357 Last data filed at 05/13/2019 0515 Gross per 24 hour  Intake 240 ml  Output 1500 ml  Net -1260 ml   Filed Weights   05/10/19 1727 05/13/19 0500  Weight: 90 kg 86.4 kg     Examination:  General exam: Appears calm and comfortable  Respiratory system: Clear to auscultation. Respiratory effort normal. Cardiovascular system: S1 & S2 heard, RRR. No JVD, murmurs, rubs, gallops or clicks.  Trace pitting edema bilateral lower extremity Gastrointestinal system: Abdomen is nondistended, soft and nontender. No organomegaly or masses felt. Normal bowel sounds heard. Central nervous system: Alert and oriented. No focal neurological deficits. Extremities: Symmetric 5 x 5 power. Skin: No rashes, lesions or ulcers Psychiatry: Judgement and insight appear normal. Mood & affect appropriate.    Data Reviewed: I have personally reviewed following labs and imaging studies  CBC: Recent Labs  Lab 05/10/19 1324 05/10/19 1640 05/11/19 0235 05/12/19 0256 05/13/19 0343  WBC 10.1 10.7* 6.5 12.0* 11.0*  NEUTROABS 7.7  --   --   --   --   HGB 11.0* 11.0* 10.1* 10.0* 10.7*  HCT 37.1 37.0 33.3* 33.6* 35.5*  MCV 85.7 85.8 84.5 85.9 84.7  PLT 320 298 278 308 322   Basic Metabolic Panel: Recent Labs  Lab 05/10/19 1324 05/11/19 0235 05/12/19 0256 05/13/19 0343  NA 140 137 136 140  K 3.7 3.6 4.4 4.0  CL 101 98 97* 96*  CO2 29 27 29 30   GLUCOSE 153* 240* 214* 180*  BUN 16 20 28* 43*  CREATININE 1.18* 0.95 1.08* 1.25*  CALCIUM 9.3 8.9 9.2 9.5   GFR: Estimated Creatinine Clearance: 34.9 mL/min (A) (by C-G formula based on SCr of 1.25 mg/dL (H)). Liver Function Tests: No results for input(s): AST, ALT, ALKPHOS, BILITOT, PROT, ALBUMIN in the last 168 hours. No results for input(s): LIPASE, AMYLASE in the last 168 hours. No results for input(s): AMMONIA in the last 168 hours. Coagulation Profile: No results for input(s): INR, PROTIME in the last 168 hours. Cardiac Enzymes: No results for input(s): CKTOTAL, CKMB, CKMBINDEX, TROPONINI in the last 168 hours. BNP (last 3 results) No results for input(s): PROBNP in the last 8760 hours. HbA1C: Recent Labs     05/10/19 1640  HGBA1C 6.8*   CBG: Recent Labs  Lab 05/12/19 1120 05/12/19 1819 05/12/19 2115 05/13/19 0808 05/13/19 1219  GLUCAP 193* 194* 237* 163* 176*   Lipid Profile: No results for input(s): CHOL, HDL, LDLCALC, TRIG, CHOLHDL, LDLDIRECT in the last 72 hours. Thyroid Function Tests: Recent Labs    05/10/19 1707  TSH 0.994   Anemia Panel: No results for input(s): VITAMINB12, FOLATE, FERRITIN, TIBC, IRON, RETICCTPCT in the last 72 hours. Sepsis Labs: No results for input(s): PROCALCITON, LATICACIDVEN in the last 168 hours.  Recent Results (from the past 240 hour(s))  Respiratory Panel by RT PCR (Flu A&B, Covid) - Nasopharyngeal Swab     Status: None   Collection Time: 05/10/19  2:08 PM   Specimen: Nasopharyngeal Swab  Result Value Ref Range Status   SARS Coronavirus 2 by RT PCR NEGATIVE NEGATIVE Final    Comment: (NOTE) SARS-CoV-2 target nucleic acids are NOT DETECTED. The SARS-CoV-2 RNA is generally detectable in upper  respiratoy specimens during the acute phase of infection. The lowest concentration of SARS-CoV-2 viral copies this assay can detect is 131 copies/mL. A negative result does not preclude SARS-Cov-2 infection and should not be used as the sole basis for treatment or other patient management decisions. A negative result may occur with  improper specimen collection/handling, submission of specimen other than nasopharyngeal swab, presence of viral mutation(s) within the areas targeted by this assay, and inadequate number of viral copies (<131 copies/mL). A negative result must be combined with clinical observations, patient history, and epidemiological information. The expected result is Negative. Fact Sheet for Patients:  PinkCheek.be Fact Sheet for Healthcare Providers:  GravelBags.it This test is not yet ap proved or cleared by the [place] FDA and  has been authorized for detection and/or  diagnosis of SARS-CoV-2 by FDA under an Emergency Use Authorization (EUA). This EUA will remain  in effect (meaning this test can be used) for the duration of the COVID-19 declaration under Section 564(b)(1) of the Act, 21 U.S.C. section 360bbb-3(b)(1), unless the authorization is terminated or revoked sooner.    Influenza A by PCR NEGATIVE NEGATIVE Final   Influenza B by PCR NEGATIVE NEGATIVE Final    Comment: (NOTE) The Xpert Xpress SARS-CoV-2/FLU/RSV assay is intended as an aid in  the diagnosis of influenza from Nasopharyngeal swab specimens and  should not be used as a sole basis for treatment. Nasal washings and  aspirates are unacceptable for Xpert Xpress SARS-CoV-2/FLU/RSV  testing. Fact Sheet for Patients: PinkCheek.be Fact Sheet for Healthcare Providers: GravelBags.it This test is not yet approved or cleared by the [place] FDA and  has been authorized for detection and/or diagnosis of SARS-CoV-2 by  FDA under an Emergency Use Authorization (EUA). This EUA will remain  in effect (meaning this test can be used) for the duration of the  Covid-19 declaration under Section 564(b)(1) of the Act, 21  U.S.C. section 360bbb-3(b)(1), unless the authorization is  terminated or revoked. Performed at [facility], [place] [address] [place], [place] [address]       Radiology Studies: MR BRAIN WO CONTRAST  Result Date: 05/12/2019 CLINICAL DATA:  Diplopia. Hypertensive urgency on presentation. EXAM: MRI HEAD WITHOUT CONTRAST TECHNIQUE: Multiplanar, multiecho pulse sequences of the brain and surrounding structures were obtained without intravenous contrast. COMPARISON:  None. FINDINGS: Brain: No acute infarct, mass, midline shift, or extra-axial fluid collection is identified. A single punctate focus of susceptibility artifact in the left occipital lobe suggests a chronic microhemorrhage. There is a mildly expanded  partially empty sella. There is mild hazy T2 FLAIR hyperintensity in the cerebral white matter bilaterally with slightly more prominent hyperintensity in the subcortical white matter of both occipital lobes without definite cortical involvement and without swelling. Generalized cerebral atrophy is within normal limits for age. Vascular: Major intracranial vascular flow voids are preserved. Skull and upper cervical spine: Unremarkable bone marrow signal. Sinuses/Orbits: Bilateral cataract extraction. Paranasal sinuses and mastoid air cells are clear. Other: None. IMPRESSION: 1. Mild cerebral white matter T2 signal changes, likely reflective of chronic small vessel ischemia although very mild changes of posterior reversible encephalopathy syndrome (PRES) are not excluded in the occipital lobes. 2. No acute infarct. Electronically Signed   By: Logan Bores M.D.   On: 05/12/2019 18:55   DG CHEST PORT 1 VIEW  Result Date: 05/13/2019 CLINICAL DATA:  Hypoxia EXAM: PORTABLE CHEST 1 VIEW COMPARISON:  05/10/2019 FINDINGS: Cardiomegaly. No confluent opacities, effusions or edema. No acute bony abnormality. IMPRESSION:  No active disease. Electronically Signed   By: Charlett Nose M.D.   On: 05/13/2019 11:04    Scheduled Meds: . amLODipine  10 mg Oral Daily  . budesonide (PULMICORT) nebulizer solution  0.5 mg Nebulization BID  . carvedilol  12.5 mg Oral BID WC  . chlorthalidone  12.5 mg Oral Daily  . citalopram  20 mg Oral Daily  . enoxaparin (LOVENOX) injection  40 mg Subcutaneous Q24H  . guaiFENesin  1,200 mg Oral BID  . insulin aspart  0-9 Units Subcutaneous TID WC  . insulin glargine  12 Units Subcutaneous Daily  . ipratropium-albuterol  3 mL Nebulization QID  . irbesartan  300 mg Oral Daily  . levothyroxine  25 mcg Oral QAC breakfast  . loratadine  10 mg Oral Daily  . methylPREDNISolone (SOLU-MEDROL) injection  40 mg Intravenous Q8H  . pantoprazole  40 mg Oral Daily  . potassium chloride  10 mEq Oral  Daily   Continuous Infusions: . azithromycin 500 mg (05/12/19 1435)     LOS: 3 days   Time spent: 30 minutes   Hughie Closs, MD [facility]  05/13/2019, 1:57 PM   To contact the attending provider between 7A-7P or the covering provider during after hours 7P-7A, please log into the web site [email or web address].

## 2019-05-13 NOTE — Care Management Important Message (Signed)
Important Message  Patient Details  Name: Virginia Summers MRN: [id number] Date of Birth: [birth year]   Medicare Important Message Given:  Yes     Overton Boggus Stefan Church 05/13/2019, 12:49 PM

## 2019-05-13 NOTE — TOC Initial Note (Signed)
Transition of Care [facility]) - Initial/Assessment Note    Patient Details  Name: Virginia Summers MRN: [id number] Date of Birth: 30-Sep-1940  [facility]) CM/SW Contact:    Maryclare Labrador, RN Phone Number: 05/13/2019, 4:29 PM  Clinical Narrative:  Per pt she is independent from home.  Pt has PCP and denied barriers with paying for medications.  CM offered medicare.gov HH choice - pt did not have a preference.  [facility] can accept pt - pt in agreement with agency.                   Expected Discharge Plan: [place] Barriers to Discharge: Continued Medical Work up   Patient Goals and CMS Choice Patient states their goals for this hospitalization and ongoing recovery are:: " I''m just ready to get back to feeling better" CMS Medicare.gov Compare Post Acute Care list provided to:: Patient Choice offered to / list presented to : Patient  Expected Discharge Plan and Services Expected Discharge Plan: [place]       Living arrangements for the past 2 months: Single Family Home                           HH Arranged: PT, OT HH Agency: [facility] Date [place]: 05/13/19 Time [place]: 1628 Representative spoke with at [place]: [place] Arrangements/Services Living arrangements for the past 2 months: [place]   Patient language and need for interpreter reviewed:: Yes Do you feel safe going back to the place where you live?: Yes      Need for Family Participation in Patient Care: Yes (Comment) Care giver support system in place?: Yes (comment)   Criminal Activity/Legal Involvement Pertinent to Current Situation/Hospitalization: No - Comment as needed  Activities of Daily Living Home Assistive Devices/Equipment: None ADL Screening (condition at time of admission) Patient's cognitive ability adequate to safely complete daily activities?: Yes Is the patient deaf or have  difficulty hearing?: Yes Does the patient have difficulty seeing, even when wearing glasses/contacts?: No Does the patient have difficulty concentrating, remembering, or making decisions?: No Patient able to express need for assistance with ADLs?: Yes Does the patient have difficulty dressing or bathing?: No Independently performs ADLs?: Yes (appropriate for developmental age) Does the patient have difficulty walking or climbing stairs?: Yes Weakness of Legs: Both Weakness of Arms/Hands: None  Permission Sought/Granted   Permission granted to share information with : Yes, Verbal Permission Granted              Emotional Assessment   Attitude/Demeanor/Rapport: Gracious, Self-Confident, Engaged Affect (typically observed): Accepting Orientation: : Oriented to Self, Oriented to Place, Oriented to  Time, Oriented to Situation   Psych Involvement: No (comment)  Admission diagnosis:  Acute respiratory failure with hypoxia ([place]) [J96.01] COPD with acute exacerbation ([place]) [J44.1] Patient Active Problem List   Diagnosis Date Noted  . COPD with acute exacerbation ([place]) 05/10/2019  . History of pulmonary embolus (PE) 05/10/2019  . Hiatal hernia 05/12/2011  . Hypothyroidism 08/10/2010  . Cramp 06/09/2010  . Pulmonary HTN ([place]) 06/09/2010  . Pulmonary embolism ([place])   . HTN (hypertension)   . Anticoagulant long-term use    PCP:  Deborah Chalk, FNP Pharmacy:   [facility] [address] ([address] [place] - [address] [place]) [place] [address] Phone: [phone] Fax:  [phone]     Social Determinants of Health (SDOH) Interventions    Readmission Risk Interventions No flowsheet data found.

## 2019-05-13 NOTE — Evaluation (Signed)
Occupational Therapy Evaluation Patient Details Name: Virginia Summers MRN: [id number] DOB: 02-05-1941 Today's Date: 05/13/2019    History of Present Illness Pt is a 79 y.o. female admitted 05/10/19 with SOB, hypxoia and difficult to control HTN. Work-up significant for COPD exacerbation and CAP. CT negative for PE. PMH includes HTN, COPD, DM2, anxiety, depression.   Clinical Impression   Pt is typically independent. Complains of light headedness upon initially standing, but resolves quickly. Pt needing min assist for LB ADL, but otherwise set up to min guard for ADL. Pt on 1L 02 with Sp02 of 93% during exertion. Safer with RW, although no overt LOB.     Follow Up Recommendations  Home health OT    Equipment Recommendations  None recommended by OT    Recommendations for Other Services       Precautions / Restrictions Precautions Precautions: Fall Precaution Comments: Watch SpO2 Restrictions Weight Bearing Restrictions: No      Mobility Bed Mobility Overal bed mobility: Modified Independent             General bed mobility comments: HOB elevated  Transfers Overall transfer level: Needs assistance Equipment used: Rolling walker (2 wheeled) Transfers: Sit to/from Stand Sit to Stand: Min guard              Balance Overall balance assessment: Needs assistance   Sitting balance-Leahy Scale: Good       Standing balance-Leahy Scale: Fair                             ADL either performed or assessed with clinical judgement   ADL Overall ADL's : Needs assistance/impaired Eating/Feeding: Independent;Bed level   Grooming: Brushing hair;Oral care;Wash/dry hands;Standing;Min guard   Upper Body Bathing: Set up;Sitting   Lower Body Bathing: Minimal assistance;Sit to/from stand   Upper Body Dressing : Set up;Sitting   Lower Body Dressing: Minimal assistance;Sit to/from stand   Toilet Transfer: Min guard;Ambulation;Regular Toilet;RW   Toileting-  Architect and Hygiene: Min guard;Sit to/from stand       Functional mobility during ADLs: Min guard;Rolling walker       Vision Patient Visual Report: No change from baseline       Perception     Praxis      Pertinent Vitals/Pain Pain Assessment: No/denies pain     Hand Dominance Right   Extremity/Trunk Assessment Upper Extremity Assessment Upper Extremity Assessment: Overall WFL for tasks assessed(tremulous with fatigue)   Lower Extremity Assessment Lower Extremity Assessment: Defer to PT evaluation   Cervical / Trunk Assessment Cervical / Trunk Assessment: Normal   Communication Communication Communication: No difficulties   Cognition Arousal/Alertness: Awake/alert Behavior During Therapy: WFL for tasks assessed/performed Overall Cognitive Status: Within Functional Limits for tasks assessed                                 General Comments: distractible and forgets what she was doing   General Comments       Exercises     Shoulder Instructions      Home Living Family/patient expects to be discharged to:: Private residence Living Arrangements: Alone Available Help at Discharge: Family;Available PRN/intermittently Type of Home: House Home Access: Stairs to enter [employer] of Steps: 3-4 Entrance Stairs-Rails: Right Home Layout: One level     Bathroom Shower/Tub: Chief Strategy Officer: Standard     Home  Equipment: Building services engineer Comments: 3 daughters live nearby but work during day; pt may or may not stay with one for a few days, or one of them stay with her      Prior Functioning/Environment Level of Independence: Independent                 OT Problem List: Decreased strength;Decreased activity tolerance;Impaired balance (sitting and/or standing);Decreased knowledge of use of DME or AE      OT Treatment/Interventions: Self-care/ADL training;DME and/or AE instruction;Energy  conservation;Patient/family education;Balance training;Therapeutic activities    OT Goals(Current goals can be found in the care plan section) Acute Rehab OT Goals Patient Stated Goal: Return home and keep independence OT Goal Formulation: With patient Time For Goal Achievement: 05/27/19 Potential to Achieve Goals: Good  OT Frequency: Min 2X/week   Barriers to D/C:            Co-evaluation              AM-PAC OT "6 Clicks" Daily Activity     Outcome Measure Help from another person eating meals?: None Help from another person taking care of personal grooming?: A Little Help from another person toileting, which includes using toliet, bedpan, or urinal?: A Little Help from another person bathing (including washing, rinsing, drying)?: A Little Help from another person to put on and taking off regular upper body clothing?: None Help from another person to put on and taking off regular lower body clothing?: A Little 6 Click Score: 20   End of Session Equipment Utilized During Treatment: Gait belt;Rolling walker;Oxygen(1L)  Activity Tolerance: Patient tolerated treatment well Patient left: in chair;with call bell/phone within reach;with nursing/sitter in room  OT Visit Diagnosis: Unsteadiness on feet (R26.81);Dizziness and giddiness (R42);Muscle weakness (generalized) (M62.81)                Time: 3149-7026 OT Time Calculation (min): 53 min Charges:  OT General Charges $OT Visit: 1 Visit OT Evaluation $OT Eval Moderate Complexity: 1 Mod OT Treatments $Self Care/Home Management : 23-37 mins  Virginia Summers, OTR/L Acute Rehabilitation Services Pager: [phone] Office: [phone]  Virginia Summers 05/13/2019, 3:16 PM

## 2019-05-14 LAB — BASIC METABOLIC PANEL
Anion gap: 11 (ref 5–15)
BUN: 52 mg/dL — ABNORMAL HIGH (ref 8–23)
CO2: 30 mmol/L (ref 22–32)
Calcium: 9.4 mg/dL (ref 8.9–10.3)
Chloride: 97 mmol/L — ABNORMAL LOW (ref 98–111)
Creatinine, Ser: 1.52 mg/dL — ABNORMAL HIGH (ref 0.44–1.00)
GFR calc Af Amer: 37 mL/min — ABNORMAL LOW (ref >60)
GFR calc non Af Amer: 32 mL/min — ABNORMAL LOW (ref >60)
Glucose, Bld: 172 mg/dL — ABNORMAL HIGH (ref 70–99)
Potassium: 4.1 mmol/L (ref 3.5–5.1)
Sodium: 138 mmol/L (ref 135–145)

## 2019-05-14 LAB — CBC
HCT: 39.6 % (ref 36.0–46.0)
Hemoglobin: 12 g/dL (ref 12.0–15.0)
MCH: 25.5 pg — ABNORMAL LOW (ref 26.0–34.0)
MCHC: 30.3 g/dL (ref 30.0–36.0)
MCV: 84.1 fL (ref 80.0–100.0)
Platelets: 347 10*3/uL (ref 150–400)
RBC: 4.71 MIL/uL (ref 3.87–5.11)
RDW: 14.2 % (ref 11.5–15.5)
WBC: 9.4 10*3/uL (ref 4.0–10.5)
nRBC: 0 % (ref 0.0–0.2)

## 2019-05-14 LAB — GLUCOSE, CAPILLARY
Glucose-Capillary: 180 mg/dL — ABNORMAL HIGH (ref 70–99)
Glucose-Capillary: 191 mg/dL — ABNORMAL HIGH (ref 70–99)

## 2019-05-14 MED ORDER — AMLODIPINE BESYLATE 10 MG PO TABS: 10.0000 mg | ORAL_TABLET | Freq: Every day | ORAL | 0 refills | Status: DC

## 2019-05-14 MED ORDER — PREDNISONE 50 MG PO TABS: ORAL_TABLET | ORAL | 2 refills | Status: AC

## 2019-05-14 MED ORDER — AZITHROMYCIN 250 MG PO TABS: 500.0000 mg | ORAL_TABLET | Freq: Every day | ORAL | Status: DC

## 2019-05-14 MED ORDER — AZITHROMYCIN 250 MG PO TABS: 250.0000 mg | ORAL_TABLET | Freq: Every day | ORAL | 0 refills | Status: AC

## 2019-05-14 MED ORDER — PREDNISONE 5 MG PO TABS: 10.0000 mg | ORAL_TABLET | Freq: Every day | ORAL | Status: DC

## 2019-05-14 NOTE — TOC Transition Note (Addendum)
Transition of Care [facility]) - CM/SW Discharge Note   Patient Details  Name: Virginia Summers MRN: [id number] Date of Birth: [birth year]  [facility] of [facility]) CM/SW Contact:  Lawerance Sabal, RN Phone Number: 05/14/2019, 1:59 PM   Clinical Narrative:    Sherron Monday w patient about DC. Patient verified that she needs RW, ordered through [employer] for delivery to room prior to DC.  Notified [facility] that patient will DC today.  Chip Boer states they are out of network. [facility] is able to accept referral.     Final next level of care: Home w Home Health Services Barriers to Discharge: No Barriers Identified   Patient Goals and CMS Choice Patient states their goals for this hospitalization and ongoing recovery are:: " I''m just ready to get back to feeling better" CMS Medicare.gov Compare Post Acute Care list provided to:: Patient Choice offered to / list presented to : Patient  Discharge Placement                       Discharge Plan and Services                DME Arranged: Walker rolling DME Agency: [facility] Date DME Agency Contacted: 05/14/19 Time DME Agency Contacted: 1359 Representative spoke with at DME Agency: Ian Malkin HH Arranged: PT, OT HH Agency: [facility] Date [facility] Agency Contacted: 05/13/19 Time HH Agency Contacted: 1628 Representative spoke with at [facility] Agency: Kenard Gower  Social Determinants of Health (SDOH) Interventions     Readmission Risk Interventions No flowsheet data found.

## 2019-05-14 NOTE — Progress Notes (Signed)
All discharge information given to pt and family member, IV removed and all questions answered Pts belongings returned and AVS printed and given to pt. Waited for pt to get a FWW walker for discharge.

## 2019-05-14 NOTE — Progress Notes (Signed)
SATURATION QUALIFICATIONS: (This note is used to comply with regulatory documentation for home oxygen)  Patient Saturations on Room Air at Rest = 96%  Patient Saturations on Room Air while Ambulating = 93%  Patient Saturations on 0 Liters of oxygen while Ambulating = 93%  Please briefly explain why patient needs home oxygen: pt was without shortness of breath but did complain of being dizzy

## 2019-05-14 NOTE — Discharge Summary (Signed)
Physician Discharge Summary  Virginia Summers WVP:[id number] DOB: [birth year] DOA: 05/10/2019  PCP: Eather Colas, FNP  Admit date: 05/10/2019 Discharge date: 05/14/2019  Admitted From: Home Disposition: Home  Recommendations for Outpatient Follow-up:  1. Follow up with PCP in 1-2 weeks 2. Follow with your pulmonologist 3. Please obtain BMP/CBC in one week 4. Please follow up on the following pending results:  Home Health: Yes Equipment/Devices: Walker  Discharge Condition: Stable CODE STATUS: Full code Diet recommendation: Cardiac  Subjective: Seen and examined. Feels better. Only complaint is generalized weakness. Breathing has improved.  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 Elease Hashimoto, and hypothyroidism who was seen in her PCP office for routine management of difficult to control hypertension. At that time she admitted to feeling short of breath and was noted to have O2 saturations in the 80s on room air along with audible wheezing. Patient was sent to the ED for evaluation and treatment, her work-up significant for COPD. Patient was admitted to hospital service for acute hypoxic respiratory failure secondary to acute COPD exacerbation/chronic bronchitis exacerbation. She was started on high-dose Solu-Medrol as well as DuoNeb and Pulmicort. Her BNP was also elevated however her chest x-ray did not show any acute pulmonary edema or CHF. She received few doses of IV Lasix which also helped her improve her symptoms. She also found to have significantly elevated blood pressure for which she was started on amlodipine. She then complained of diplopia 2 days ago. She did not have any focal neurological deficit on exam. MRI brain was obtained which ruled out any stroke but it suspected PRES. patient's amlodipine was increased to 10 mg. Her blood  pressure has remained controlled in last 2 days and her headache has resolved. Only complaint she currently has is generalized weakness. She was seen by PT OT and they recommended home health care and rolling walker which has been ordered for her. Her ambulatory oximetry was checked and she saturated 93% with exertion. She is being discharged in stable condition. Prior to admission, she was on prednisone 10 mg p.o. daily which apparently his PCP is trying to taper down. I have discharged this patient on 2 more days of 50 mg p.o. daily prednisone and after that she will resume back at 10 mg p.o. daily. Will defer to his PCP to taper that. I will also continue her azithromycin for 2 more days to complete the course for anti-inflammatory purpose. She is also being prescribed amlodipine 10 mg p.o. daily. Resume all home medications.  Discharge Diagnoses:  Principal Problem:   COPD with acute exacerbation (HCC) Active Problems:   HTN (hypertension)   Pulmonary HTN (HCC)   Hypothyroidism   History of pulmonary embolus (PE)    Discharge Instructions  Discharge Instructions    Discharge patient   Complete by: As directed    Discharge disposition: 06-Home-Health Care Svc   Discharge patient date: 05/14/2019     Allergies as of 05/14/2019      Reactions   Penicillins Swelling, Rash   Did it involve swelling of the face/tongue/throat, SOB, or low BP? No Did it involve sudden or severe rash/hives, skin peeling, or any reaction on the inside of your mouth or nose? No Did you need to seek medical attention at a hospital or doctor's office? No When did it last happen?childhood If all above answers are "NO", may proceed with cephalosporin use.   Sulfa  Drugs Cross Reactors Swelling, Rash      Medication List    TAKE these medications   amLODipine 10 MG tablet Commonly known as: NORVASC Take 1 tablet (10 mg total) by mouth daily. Start taking on: May 15, 2019   azithromycin 250 MG  tablet Commonly known as: ZITHROMAX Take 1 tablet (250 mg total) by mouth daily for 2 days.   budesonide-formoterol 80-4.5 MCG/ACT inhaler Commonly known as: SYMBICORT Inhale 2 puffs into the lungs 2 (two) times daily.   carvedilol 12.5 MG tablet Commonly known as: COREG Take 12.5 mg by mouth 2 (two) times daily with a meal.   chlorthalidone 25 MG tablet Commonly known as: HYGROTON Take 0.5 tablets (12.5 mg total) by mouth daily.   citalopram 20 MG tablet Commonly known as: CELEXA Take 20 mg by mouth daily.   fluticasone 50 MCG/ACT nasal spray Commonly known as: FLONASE Place 2 sprays into both nostrils daily.   levothyroxine 25 MCG tablet Commonly known as: SYNTHROID TAKE 1 TABLET (25 MCG TOTAL) BY MOUTH DAILY. What changed: See the new instructions.   metFORMIN 500 MG 24 hr tablet Commonly known as: GLUCOPHAGE-XR Take 500 mg by mouth 2 (two) times daily before a meal.   potassium chloride 10 MEQ tablet Commonly known as: KLOR-CON Take 1 tablet (10 mEq total) by mouth daily.   predniSONE 50 MG tablet Commonly known as: DELTASONE Last dose tomorrow on 05/15/2019. Following this, please take your regular prednisone 10 mg p.o. daily. What changed: You were already taking a medication with the same name, and this prescription was added. Make sure you understand how and when to take each.   predniSONE 5 MG tablet Commonly known as: DELTASONE Take 2 tablets (10 mg total) by mouth daily with breakfast. Start taking on: May 16, 2019 What changed: These instructions start on May 16, 2019. If you are unsure what to do until then, ask your doctor or other care provider.   Proventil HFA 108 (90 Base) MCG/ACT inhaler Generic drug: albuterol Inhale 2 puffs into the lungs every 4 (four) hours as needed. For shortness of breath.   traZODone 50 MG tablet Commonly known as: DESYREL Take 50 mg by mouth at bedtime as needed for sleep.   valsartan 320 MG tablet Commonly known as:  DIOVAN Take 1 tablet (320 mg total) by mouth daily.            Durable Medical Equipment  (From admission, onward)         Start     Ordered   05/14/19 1121  For home use only DME Walker rolling  Once    Question Answer Comment  Walker: With 5 Inch Wheels   Patient needs a walker to treat with the following condition Balance problem      05/14/19 1120         Follow-up Information    [facility].   Why: Homehealth       Hunter, Megan A, FNP Follow up in 1 week(s).   Specialty: Nurse Practitioner Contact information: [address] [place] [id number] [phone]        Nahser, Deloris PingPhilip J, MD .   Specialty: Cardiology Contact information: [address] [place] [address] [phone]          Allergies  Allergen Reactions  . Penicillins Swelling and Rash    Did it involve swelling of the face/tongue/throat, SOB, or low BP? No Did  it involve sudden or severe rash/hives, skin peeling, or any reaction on the inside of your mouth or nose? No Did you need to seek medical attention at a hospital or doctor's office? No When did it last happen?childhood If all above answers are "NO", may proceed with cephalosporin use.  . Sulfa Drugs Cross Reactors Swelling and Rash    Consultations: None   Procedures/Studies: CT ABDOMEN WO CONTRAST  Result Date: 05/10/2019 CLINICAL DATA:  Left upper quadrant abdominal pain. EXAM: CT ABDOMEN WITHOUT CONTRAST TECHNIQUE: Multidetector CT imaging of the abdomen was performed following the standard protocol without IV contrast. COMPARISON:  None. FINDINGS: Lower chest: The lung bases are clear. The heart size is normal. Hepatobiliary: The liver is normal. Normal gallbladder.There is no biliary ductal dilation. Pancreas: Normal contours without ductal dilatation. No peripancreatic fluid collection. Spleen: No splenic laceration or hematoma. Adrenals/Urinary Tract:  --Adrenal glands: No adrenal hemorrhage. --Right kidney/ureter: No hydronephrosis or perinephric hematoma. --Left kidney/ureter: There is an indeterminate 2.8 cm hypoattenuating structure in the interpolar region of the left kidney. This is favored to represent a proteinaceous or hemorrhagic cyst as seen on the patient's prior abdominal ultrasound. --Urinary bladder: Outside the field of view. Stomach/Bowel: --Stomach/Duodenum: No hiatal hernia or other gastric abnormality. Normal duodenal course and caliber. --Small bowel: No dilatation or inflammation. --Colon: There is scattered colonic diverticula without CT evidence for diverticulitis. --Appendix: Outside the field of view. Vascular/Lymphatic: Atherosclerotic calcification is present within the non-aneurysmal abdominal aorta, without hemodynamically significant stenosis. --No retroperitoneal lymphadenopathy. --No mesenteric lymphadenopathy. Other: No ascites or free air. The abdominal wall is normal. Musculoskeletal. No acute displaced fractures. IMPRESSION: 1. No acute abnormality identified to explain the patient's left upper quadrant abdominal pain. 2. Scattered colonic diverticula without CT evidence for diverticulitis. Electronically Signed   By: Katherine Mantle M.D.   On: 05/10/2019 22:50   CT Angio Chest PE W and/or Wo Contrast  Result Date: 05/10/2019 CLINICAL DATA:  Shortness of breath and chest pain. History of pulmonary embolus. EXAM: CT ANGIOGRAPHY CHEST WITH CONTRAST TECHNIQUE: Multidetector CT imaging of the chest was performed using the standard protocol during bolus administration of intravenous contrast. Multiplanar CT image reconstructions and MIPs were obtained to evaluate the vascular anatomy. CONTRAST:  75 mL OMNIPAQUE IOHEXOL 350 MG/ML SOLN COMPARISON:  CT chest 05/26/2010. Single-view of the chest earlier today. FINDINGS: Cardiovascular: Satisfactory opacification of the pulmonary arteries to the segmental level. No evidence of  pulmonary embolism. Normal heart size. No pericardial effusion. Aortic atherosclerosis noted. Mediastinum/Nodes: No enlarged mediastinal, hilar, or axillary lymph nodes. Thyroid gland, trachea, and esophagus demonstrate no significant findings. A small hiatal hernia is seen. Lungs/Pleura: No pleural effusion. Mild dependent atelectasis. The lungs are otherwise clear. Upper Abdomen: Negative. Musculoskeletal: No acute or focal bony abnormality. Review of the MIP images confirms the above findings. IMPRESSION: 1. Negative for pulmonary embolus. No acute disease. 2. Small hiatal hernia. Aortic Atherosclerosis (ICD10-I70.0). Electronically Signed   By: Drusilla Kanner M.D.   On: 05/10/2019 15:05   MR BRAIN WO CONTRAST  Result Date: 05/12/2019 CLINICAL DATA:  Diplopia. Hypertensive urgency on presentation. EXAM: MRI HEAD WITHOUT CONTRAST TECHNIQUE: Multiplanar, multiecho pulse sequences of the brain and surrounding structures were obtained without intravenous contrast. COMPARISON:  None. FINDINGS: Brain: No acute infarct, mass, midline shift, or extra-axial fluid collection is identified. A single punctate focus of susceptibility artifact in the left occipital lobe suggests a chronic microhemorrhage. There is a mildly expanded partially empty sella. There is mild hazy T2 FLAIR  hyperintensity in the cerebral white matter bilaterally with slightly more prominent hyperintensity in the subcortical white matter of both occipital lobes without definite cortical involvement and without swelling. Generalized cerebral atrophy is within normal limits for age. Vascular: Major intracranial vascular flow voids are preserved. Skull and upper cervical spine: Unremarkable bone marrow signal. Sinuses/Orbits: Bilateral cataract extraction. Paranasal sinuses and mastoid air cells are clear. Other: None. IMPRESSION: 1. Mild cerebral white matter T2 signal changes, likely reflective of chronic small vessel ischemia although very mild  changes of posterior reversible encephalopathy syndrome (PRES) are not excluded in the occipital lobes. 2. No acute infarct. Electronically Signed   By: Sebastian Ache M.D.   On: 05/12/2019 18:55   DG CHEST PORT 1 VIEW  Result Date: 05/13/2019 CLINICAL DATA:  Hypoxia EXAM: PORTABLE CHEST 1 VIEW COMPARISON:  05/10/2019 FINDINGS: Cardiomegaly. No confluent opacities, effusions or edema. No acute bony abnormality. IMPRESSION: No active disease. Electronically Signed   By: Charlett Nose M.D.   On: 05/13/2019 11:04   DG Chest Port 1 View  Result Date: 05/10/2019 CLINICAL DATA:  Shortness of breath for 3 weeks EXAM: PORTABLE CHEST 1 VIEW COMPARISON:  09/26/2018 FINDINGS: There is bibasilar hazy airspace disease concerning for atelectasis versus pneumonia. There is no pleural effusion or pneumothorax. The heart and mediastinal contours are unremarkable. There is no acute osseous abnormality. IMPRESSION: Bibasilar hazy airspace disease concerning for atelectasis versus pneumonia. Electronically Signed   By: Elige Ko   On: 05/10/2019 14:16   ECHOCARDIOGRAM COMPLETE  Result Date: 05/13/2019    ECHOCARDIOGRAM REPORT   Patient Name:   Virginia Summers Sesay Date of Exam: 05/13/2019 Medical Rec #:  [id number]    Height:       59.0 in Accession #:    [id number]   Weight:       190.5 lb Date of Birth:  April 05, 1940   BSA:          1.807 m Patient Age:    78 years     BP:           154/62 mmHg Patient Gender: F            HR:           68 bpm. Exam Location:  Inpatient Procedure: 2D Echo, Cardiac Doppler and Color Doppler Indications:    Dyspnea  History:        Patient has prior history of Echocardiogram examinations, most                 recent 10/19/2017. CHF, COPD, Arrythmias:LBBB; Risk                 Factors:Hypertension, Diabetes and Obesity. Pul. embolus.  Sonographer:    Lavenia Atlas Referring Phys: [id number] DA[address] [place] IMPRESSIONS  1. Left ventricular ejection fraction, by estimation, is 55 to 60%. The left ventricle  has normal function. The left ventricle has no regional wall motion abnormalities. There is severe concentric left ventricular hypertrophy. Left ventricular diastolic  parameters are consistent with Grade I diastolic dysfunction (impaired relaxation).  2. Right ventricular systolic function is normal. The right ventricular size is normal. There is normal pulmonary artery systolic pressure.  3. The mitral valve is normal in structure and function. Trivial mitral valve regurgitation. No evidence of mitral stenosis.  4. The aortic valve is normal in structure and function. Aortic valve regurgitation is not visualized. No aortic stenosis is present. FINDINGS  Left Ventricle: Left ventricular ejection fraction, by estimation,  is 55 to 60%. The left ventricle has normal function. The left ventricle has no regional wall motion abnormalities. The left ventricular internal cavity size was normal in size. There is  severe concentric left ventricular hypertrophy. Left ventricular diastolic parameters are consistent with Grade I diastolic dysfunction (impaired relaxation). Right Ventricle: The right ventricular size is normal. No increase in right ventricular wall thickness. Right ventricular systolic function is normal. There is normal pulmonary artery systolic pressure. The tricuspid regurgitant velocity is 2.33 m/s, and  with an assumed right atrial pressure of 8 mmHg, the estimated right ventricular systolic pressure is 29.7 mmHg. Left Atrium: Left atrial size was normal in size. Right Atrium: Right atrial size was normal in size. Pericardium: There is no evidence of pericardial effusion. Mitral Valve: The mitral valve is normal in structure and function. Trivial mitral valve regurgitation. No evidence of mitral valve stenosis. Tricuspid Valve: The tricuspid valve is normal in structure. Tricuspid valve regurgitation is trivial. Aortic Valve: The aortic valve is normal in structure and function. Aortic valve regurgitation  is not visualized. No aortic stenosis is present. Pulmonic Valve: The pulmonic valve was grossly normal. Pulmonic valve regurgitation is not visualized. Aorta: The aortic root and ascending aorta are structurally normal, with no evidence of dilitation. IAS/Shunts: The atrial septum is grossly normal.  LEFT VENTRICLE PLAX 2D LVIDd:         4.19 cm  Diastology LVIDs:         3.17 cm  LV e' lateral:   5.00 cm/s LV PW:         1.43 cm  LV E/e' lateral: 15.3 LV IVS:        1.60 cm  LV e' medial:    4.90 cm/s LVOT diam:     1.60 cm  LV E/e' medial:  15.7 LV SV:         33 LV SV Index:   18 LVOT Area:     2.01 cm  RIGHT VENTRICLE RV Basal diam:  3.08 cm RV S prime:     10.30 cm/s TAPSE (M-mode): 4.0 cm LEFT ATRIUM           Index       RIGHT ATRIUM           Index LA diam:      3.80 cm 2.10 cm/m  RA Area:     16.10 cm LA Vol (A2C): 31.5 ml 17.44 ml/m RA Volume:   39.80 ml  22.03 ml/m LA Vol (A4C): 53.2 ml 29.45 ml/m  AORTIC VALVE LVOT Vmax:   79.60 cm/s LVOT Vmean:  56.000 cm/s LVOT VTI:    0.165 m  AORTA Ao Root diam: 2.70 cm MITRAL VALVE               TRICUSPID VALVE MV Area (PHT): 2.87 cm    TR Peak grad:   21.7 mmHg MV Decel Time: 264 msec    TR Vmax:        233.00 cm/s MV E velocity: 76.70 cm/s MV A velocity: 96.00 cm/s  SHUNTS MV E/A ratio:  0.80        Systemic VTI:  0.16 m                            Systemic Diam: 1.60 cm Kristeen Miss MD Electronically signed by Kristeen Miss MD Signature Date/Time: 05/13/2019/3:23:57 PM    Final       Discharge Exam:  Vitals:   05/14/19 0810 05/14/19 1119  BP:  (!) 143/67  Pulse:  65  Resp:  18  Temp:  97.7 F (36.5 C)  SpO2: 93% 93%   Vitals:   05/13/19 2329 05/14/19 0807 05/14/19 0810 05/14/19 1119  BP: (!) 143/65 (!) 181/93  (!) 143/67  Pulse: (!) 58 70  65  Resp: 20 20  18   Temp: 98.2 F (36.8 C) 98.9 F (37.2 C)  97.7 F (36.5 C)  TempSrc:      SpO2: 91% 94% 93% 93%  Weight:      Height:        General: Pt is alert, awake, not in acute  distress Cardiovascular: RRR, S1/S2 +, no rubs, no gallops Respiratory: CTA bilaterally, no wheezing, no rhonchi Abdominal: Soft, NT, ND, bowel sounds + Extremities: no edema, no cyanosis    The results of significant diagnostics from this hospitalization (including imaging, microbiology, ancillary and laboratory) are listed below for reference.     Microbiology: Recent Results (from the past 240 hour(s))  Respiratory Panel by RT PCR (Flu A&B, Covid) - Nasopharyngeal Swab     Status: None   Collection Time: 05/10/19  2:08 PM   Specimen: Nasopharyngeal Swab  Result Value Ref Range Status   SARS Coronavirus 2 by RT PCR NEGATIVE NEGATIVE Final    Comment: (NOTE) SARS-CoV-2 target nucleic acids are NOT DETECTED. The SARS-CoV-2 RNA is generally detectable in upper respiratoy specimens during the acute phase of infection. The lowest concentration of SARS-CoV-2 viral copies this assay can detect is 131 copies/mL. A negative result does not preclude SARS-Cov-2 infection and should not be used as the sole basis for treatment or other patient management decisions. A negative result may occur with  improper specimen collection/handling, submission of specimen other than nasopharyngeal swab, presence of viral mutation(s) within the areas targeted by this assay, and inadequate number of viral copies (<131 copies/mL). A negative result must be combined with clinical observations, patient history, and epidemiological information. The expected result is Negative. Fact Sheet for Patients:  05/12/19 Fact Sheet for Healthcare Providers:  https://www.moore.com/ This test is not yet ap proved or cleared by the https://www.young.biz/ FDA and  has been authorized for detection and/or diagnosis of SARS-CoV-2 by FDA under an Emergency Use Authorization (EUA). This EUA will remain  in effect (meaning this test can be used) for the duration of the COVID-19  declaration under Section 564(b)(1) of the Act, 21 U.S.C. section 360bbb-3(b)(1), unless the authorization is terminated or revoked sooner.    Influenza A by PCR NEGATIVE NEGATIVE Final   Influenza B by PCR NEGATIVE NEGATIVE Final    Comment: (NOTE) The Xpert Xpress SARS-CoV-2/FLU/RSV assay is intended as an aid in  the diagnosis of influenza from Nasopharyngeal swab specimens and  should not be used as a sole basis for treatment. Nasal washings and  aspirates are unacceptable for Xpert Xpress SARS-CoV-2/FLU/RSV  testing. Fact Sheet for Patients: [place] Fact Sheet for Healthcare Providers: https://www.moore.com/ This test is not yet approved or cleared by the https://www.young.biz/ FDA and  has been authorized for detection and/or diagnosis of SARS-CoV-2 by  FDA under an Emergency Use Authorization (EUA). This EUA will remain  in effect (meaning this test can be used) for the duration of the  Covid-19 declaration under Section 564(b)(1) of the Act, 21  U.S.C. section 360bbb-3(b)(1), unless the authorization is  terminated or revoked. Performed at [facility], [address] [place], [place]  Labs: BNP (last 3 results) Recent Labs    05/10/19 1324 05/12/19 0256 05/13/19 0343  BNP 361.0* 293.0* 216.3*   Basic Metabolic Panel: Recent Labs  Lab 05/10/19 1324 05/11/19 0235 05/12/19 0256 05/13/19 0343 05/14/19 0527  NA 140 137 136 140 138  K 3.7 3.6 4.4 4.0 4.1  CL 101 98 97* 96* 97*  CO2 29 27 29 30 30   GLUCOSE 153* 240* 214* 180* 172*  BUN 16 20 28* 43* 52*  CREATININE 1.18* 0.95 1.08* 1.25* 1.52*  CALCIUM 9.3 8.9 9.2 9.5 9.4   Liver Function Tests: No results for input(s): AST, ALT, ALKPHOS, BILITOT, PROT, ALBUMIN in the last 168 hours. No results for input(s): LIPASE, AMYLASE in the last 168 hours. No results for input(s): AMMONIA in the last 168 hours. CBC: Recent Labs  Lab 05/10/19 1324  05/10/19 1324 05/10/19 1640 05/11/19 0235 05/12/19 0256 05/13/19 0343 05/14/19 0527  WBC 10.1   < > 10.7* 6.5 12.0* 11.0* 9.4  NEUTROABS 7.7  --   --   --   --   --   --   HGB 11.0*   < > 11.0* 10.1* 10.0* 10.7* 12.0  HCT 37.1   < > 37.0 33.3* 33.6* 35.5* 39.6  MCV 85.7   < > 85.8 84.5 85.9 84.7 84.1  PLT 320   < > 298 278 308 322 347   < > = values in this interval not displayed.   Cardiac Enzymes: No results for input(s): CKTOTAL, CKMB, CKMBINDEX, TROPONINI in the last 168 hours. BNP: Invalid input(s): POCBNP CBG: Recent Labs  Lab 05/13/19 0808 05/13/19 1219 05/13/19 1554 05/13/19 2040 05/14/19 0757  GLUCAP 163* 176* 213* 318* 180*   D-Dimer No results for input(s): DDIMER in the last 72 hours. Hgb A1c No results for input(s): HGBA1C in the last 72 hours. Lipid Profile No results for input(s): CHOL, HDL, LDLCALC, TRIG, CHOLHDL, LDLDIRECT in the last 72 hours. Thyroid function studies No results for input(s): TSH, T4TOTAL, T3FREE, THYROIDAB in the last 72 hours.  Invalid input(s): FREET3 Anemia work up No results for input(s): VITAMINB12, FOLATE, FERRITIN, TIBC, IRON, RETICCTPCT in the last 72 hours. Urinalysis No results found for: COLORURINE, APPEARANCEUR, LABSPEC, PHURINE, GLUCOSEU, HGBUR, BILIRUBINUR, KETONESUR, PROTEINUR, UROBILINOGEN, NITRITE, LEUKOCYTESUR Sepsis Labs Invalid input(s): PROCALCITONIN,  WBC,  LACTICIDVEN Microbiology Recent Results (from the past 240 hour(s))  Respiratory Panel by RT PCR (Flu A&B, Covid) - Nasopharyngeal Swab     Status: None   Collection Time: 05/10/19  2:08 PM   Specimen: Nasopharyngeal Swab  Result Value Ref Range Status   SARS Coronavirus 2 by RT PCR NEGATIVE NEGATIVE Final    Comment: (NOTE) SARS-CoV-2 target nucleic acids are NOT DETECTED. The SARS-CoV-2 RNA is generally detectable in upper respiratoy specimens during the acute phase of infection. The lowest concentration of SARS-CoV-2 viral copies this assay can  detect is 131 copies/mL. A negative result does not preclude SARS-Cov-2 infection and should not be used as the sole basis for treatment or other patient management decisions. A negative result may occur with  improper specimen collection/handling, submission of specimen other than nasopharyngeal swab, presence of viral mutation(s) within the areas targeted by this assay, and inadequate number of viral copies (<131 copies/mL). A negative result must be combined with clinical observations, patient history, and epidemiological information. The expected result is Negative. Fact Sheet for Patients:  https://www.moore.com/https://www.fda.gov/media/142436/download Fact Sheet for Healthcare Providers:  https://www.young.biz/https://www.fda.gov/media/142435/download This test is not yet ap proved or cleared by the  [place] Architectural technologist and  has been authorized for detection and/or diagnosis of SARS-CoV-2 by FDA under an Print production planner (EUA). This EUA will remain  in effect (meaning this test can be used) for the duration of the COVID-19 declaration under Section 564(b)(1) of the Act, 21 U.S.C. section 360bbb-3(b)(1), unless the authorization is terminated or revoked sooner.    Influenza A by PCR NEGATIVE NEGATIVE Final   Influenza B by PCR NEGATIVE NEGATIVE Final    Comment: (NOTE) The Xpert Xpress SARS-CoV-2/FLU/RSV assay is intended as an aid in  the diagnosis of influenza from Nasopharyngeal swab specimens and  should not be used as a sole basis for treatment. Nasal washings and  aspirates are unacceptable for Xpert Xpress SARS-CoV-2/FLU/RSV  testing. Fact Sheet for Patients: PinkCheek.be Fact Sheet for Healthcare Providers: GravelBags.it This test is not yet approved or cleared by the [place] FDA and  has been authorized for detection and/or diagnosis of SARS-CoV-2 by  FDA under an Emergency Use Authorization (EUA). This EUA will remain  in effect (meaning  this test can be used) for the duration of the  Covid-19 declaration under Section 564(b)(1) of the Act, 21  U.S.C. section 360bbb-3(b)(1), unless the authorization is  terminated or revoked. Performed at [facility], [place] [address] [place], [place] [address]      Time coordinating discharge: Over 30 minutes  SIGNED:   Darliss Cheney, MD  [facility] 05/14/2019, 11:27 AM  If 7PM-7AM, please contact night-coverage [email or web address]

## 2019-05-14 NOTE — Discharge Instructions (Signed)
Acute Respiratory Failure, Adult  Acute respiratory failure occurs when there is not enough oxygen passing from your lungs to your body. When this happens, your lungs have trouble removing carbon dioxide from the blood. This causes your blood oxygen level to drop too low as carbon dioxide builds up. Acute respiratory failure is a medical emergency. It can develop quickly, but it is temporary if treated promptly. Your lung capacity, or how much air your lungs can hold, may improve with time, exercise, and treatment. What are the causes? There are many possible causes of acute respiratory failure, including:  Lung injury.  Chest injury or damage to the ribs or tissues near the lungs.  Lung conditions that affect the flow of air and blood into and out of the lungs, such as pneumonia, acute respiratory distress syndrome, and cystic fibrosis.  Medical conditions, such as strokes or spinal cord injuries, that affect the muscles and nerves that control breathing.  Blood infection (sepsis).  Inflammation of the pancreas (pancreatitis).  A blood clot in the lungs (pulmonary embolism).  A large-volume blood transfusion.  Burns.  Near-drowning.  Seizure.  Smoke inhalation.  Reaction to medicines.  Alcohol or drug overdose. What increases the risk? This condition is more likely to develop in people who have:  A blocked airway.  Asthma.  A condition or disease that damages or weakens the muscles, nerves, bones, or tissues that are involved in breathing.  A serious infection.  A health problem that blocks the unconscious reflex that is involved in breathing, such as hypothyroidism or sleep apnea.  A lung injury or trauma. What are the signs or symptoms? Trouble breathing is the main symptom of acute respiratory failure. Symptoms may also include:  Rapid breathing.  Restlessness or anxiety.  Skin, lips, or fingernails that appear blue (cyanosis).  Rapid heart  rate.  Abnormal heart rhythms (arrhythmias).  Confusion or changes in behavior.  Tiredness or loss of energy.  Feeling sleepy or having a loss of consciousness. How is this diagnosed? Your health care provider can diagnose acute respiratory failure with a medical history and physical exam. During the exam, your health care provider will listen to your heart and check for crackling or wheezing sounds in your lungs. Your may also have tests to confirm the diagnosis and determine what is causing respiratory failure. These tests may include:  Measuring the amount of oxygen in your blood (pulse oximetry). The measurement comes from a small device that is placed on your finger, earlobe, or toe.  Other blood tests to measure blood gases and to look for signs of infection.  Sampling your cerebral spinal fluid or tracheal fluid to check for infections.  Chest X-ray to look for fluid in spaces that should be filled with air.  Electrocardiogram (ECG) to look at the heart's electrical activity. How is this treated? Treatment for this condition usually takes places in a hospital intensive care unit (ICU). Treatment depends on what is causing the condition. It may include one or more treatments until your symptoms improve. Treatment may include:  Supplemental oxygen. Extra oxygen is given through a tube in the nose, a face mask, or a hood.  A device such as a continuous positive airway pressure (CPAP) or bi-level positive airway pressure (BiPAP or BPAP) machine. This treatment uses mild air pressure to keep the airways open. A mask or other device will be placed over your nose or mouth. A tube that is connected to a motor will deliver oxygen through the   mask.  Ventilator. This treatment helps move air into and out of the lungs. This may be done with a bag and mask or a machine. For this treatment, a tube is placed in your windpipe (trachea) so air and oxygen can flow to the lungs.  Extracorporeal  membrane oxygenation (ECMO). This treatment temporarily takes over the function of the heart and lungs, supplying oxygen and removing carbon dioxide. ECMO gives the lungs a chance to recover. It may be used if a ventilator is not effective.  Tracheostomy. This is a procedure that creates a hole in the neck to insert a breathing tube.  Receiving fluids and medicines.  Rocking the bed to help breathing. Follow these instructions at home:  Take over-the-counter and prescription medicines only as told by your health care provider.  Return to normal activities as told by your health care provider. Ask your health care provider what activities are safe for you.  Keep all follow-up visits as told by your health care provider. This is important. How is this prevented? Treating infections and medical conditions that may lead to acute respiratory failure can help prevent the condition from developing. Contact a health care provider if:  You have a fever.  Your symptoms do not improve or they get worse. Get help right away if:  You are having trouble breathing.  You lose consciousness.  Your have cyanosis or turn blue.  You develop a rapid heart rate.  You are confused. These symptoms may represent a serious problem that is an emergency. Do not wait to see if the symptoms will go away. Get medical help right away. Call your local emergency services (911 in the [place] Do not drive yourself to the hospital. This information is not intended to replace advice given to you by your health care provider. Make sure you discuss any questions you have with your health care provider. Document Revised: 02/10/2017 Document Reviewed: 09/16/2015 Elsevier Patient Education  2020 Elsevier Inc.  

## 2019-05-14 NOTE — Progress Notes (Signed)
Inpatient Diabetes Program Recommendations  AACE/ADA: New Consensus Statement on Inpatient Glycemic Control (2015)  Target Ranges:  Prepandial:   less than 140 mg/dL      Peak postprandial:   less than 180 mg/dL (1-2 hours)      Critically ill patients:  140 - 180 mg/dL   Lab Results  Component Value Date   GLUCAP 180 (H) 05/14/2019   HGBA1C 6.8 (H) 05/10/2019    Review of Glycemic Control Results for JERICKA, KADAR (MRN [id number]) as of 05/14/2019 11:18  Ref. Range 05/13/2019 08:08 05/13/2019 12:19 05/13/2019 15:54 05/13/2019 20:40 05/14/2019 07:57  Glucose-Capillary Latest Ref Range: 70 - 99 mg/dL 403 (H) 474 (H) 259 (H) 318 (H) 180 (H)   Diabetes history: DM 2 Outpatient Diabetes medications:  Metformin 500 mg bid Current orders for Inpatient glycemic control:  Novolog sensitive tid with meals Lantus 12 units daily Solumedrol 40 mg IV q 8 hours  Inpatient Diabetes Program Recommendations:   If appropriate, consider adding Novolog 3 units tid with meals (hold if patient eats less than 50%).   Thanks  Beryl Meager, RN, BC-ADM Inpatient Diabetes Coordinator Pager [phone] (8a-5p)

## 2019-06-04 ENCOUNTER — Ambulatory Visit: Payer: Medicare HMO | Admitting: Cardiovascular Disease

## 2019-06-04 ENCOUNTER — Other Ambulatory Visit: Payer: Self-pay

## 2019-06-04 ENCOUNTER — Encounter: Payer: Self-pay | Admitting: Cardiovascular Disease

## 2019-06-04 VITALS — BP 128/62 | HR 76 | Ht 59.0 in | Wt 196.8 lb

## 2019-06-04 DIAGNOSIS — I2699 Other pulmonary embolism without acute cor pulmonale: Secondary | ICD-10-CM

## 2019-06-04 DIAGNOSIS — I10 Essential (primary) hypertension: Secondary | ICD-10-CM | POA: Diagnosis not present

## 2019-06-04 DIAGNOSIS — I2782 Chronic pulmonary embolism: Secondary | ICD-10-CM | POA: Diagnosis not present

## 2019-06-04 DIAGNOSIS — Z5181 Encounter for therapeutic drug level monitoring: Secondary | ICD-10-CM | POA: Diagnosis not present

## 2019-06-04 MED ORDER — RIVAROXABAN 10 MG PO TABS: 10.0000 mg | ORAL_TABLET | Freq: Every day | ORAL | 11 refills | Status: DC

## 2019-06-04 MED ORDER — CHLORTHALIDONE 25 MG PO TABS: 25.0000 mg | ORAL_TABLET | Freq: Every day | ORAL | 3 refills | Status: DC

## 2019-06-04 MED ORDER — AMLODIPINE BESYLATE 5 MG PO TABS: 5.0000 mg | ORAL_TABLET | Freq: Every day | ORAL | 3 refills | Status: DC

## 2019-06-04 NOTE — Patient Instructions (Addendum)
Medication Instructions:  Your physician has recommended you make the following change in your medication:  INCREASE Chlorthalidone to 25 mg once daily DECREASE Amlodipine besylate (Norvasc) to 5 mg once daily START Xarelto 10 mg once daily  *If you need a refill on your cardiac medications before your next appointment, please call your pharmacy*   Lab Work: Your physician recommends that you return for lab work in: 4 weeks for basic metabolic panel, CBC on the same day you come into the Coumadin Clinic  If you have labs (blood work) drawn today and your tests are completely normal, you will receive your results only by: Marland Kitchen MyChart Message (if you have MyChart) OR . A paper copy in the mail If you have any lab test that is abnormal or we need to change your treatment, we will call you to review the results.   Testing/Procedures: None Ordered   Follow-Up: Your physician recommends that you schedule a follow-up appointment in: 1 month with Coumadin clinic for new start Xarelto   At [facility], you and your health needs are our priority.  As part of our continuing mission to provide you with exceptional heart care, we have created designated Provider Care Teams.  These Care Teams include your primary Cardiologist (physician) and Advanced Practice Providers (APPs -  Physician Assistants and Nurse Practitioners) who all work together to provide you with the care you need, when you need it.  We recommend signing up for the patient portal called "MyChart".  Sign up information is provided on this After Visit Summary.  MyChart is used to connect with patients for Virtual Visits (Telemedicine).  Patients are able to view lab/test results, encounter notes, upcoming appointments, etc.  Non-urgent messages can be sent to your provider as well.   To learn more about what you can do with MyChart, go to [email or web address].    Your next appointment:   3 month(s)  The format for your next  appointment:   Either In Person or Virtual  Provider:   Tereso Newcomer, PA-C   Other Instructions  Mediterranean Diet A Mediterranean diet refers to food and lifestyle choices that are based on the traditions of countries located on the [place]. This way of eating has been shown to help prevent certain conditions and improve outcomes for people who have chronic diseases, like kidney disease and heart disease. What are tips for following this plan? Lifestyle  Cook and eat meals together with your family, when possible.  Drink enough fluid to keep your urine clear or pale yellow.  Be physically active every day. This includes: ? Aerobic exercise like running or swimming. ? Leisure activities like gardening, walking, or housework.  Get 7-8 hours of sleep each night.  If recommended by your health care provider, drink red wine in moderation. This means 1 glass a day for nonpregnant women and 2 glasses a day for men. A glass of wine equals 5 oz (150 mL). Reading food labels   Check the serving size of packaged foods. For foods such as rice and pasta, the serving size refers to the amount of cooked product, not dry.  Check the total fat in packaged foods. Avoid foods that have saturated fat or trans fats.  Check the ingredients list for added sugars, such as corn syrup. Shopping  At the grocery store, buy most of your food from the areas near the walls of the store. This includes: ? Fresh fruits and vegetables (produce). ? Grains, beans, nuts, and  seeds. Some of these may be available in unpackaged forms or large amounts (in bulk). ? Fresh seafood. ? Poultry and eggs. ? Low-fat dairy products.  Buy whole ingredients instead of prepackaged foods.  Buy fresh fruits and vegetables in-season from local farmers markets.  Buy frozen fruits and vegetables in resealable bags.  If you do not have access to quality fresh seafood, buy precooked frozen shrimp or canned fish,  such as tuna, salmon, or sardines.  Buy small amounts of raw or cooked vegetables, salads, or olives from the deli or salad bar at your store.  Stock your pantry so you always have certain foods on hand, such as olive oil, canned tuna, canned tomatoes, rice, pasta, and beans. Cooking  Cook foods with extra-virgin olive oil instead of using butter or other vegetable oils.  Have meat as a side dish, and have vegetables or grains as your main dish. This means having meat in small portions or adding small amounts of meat to foods like pasta or stew.  Use beans or vegetables instead of meat in common dishes like chili or lasagna.  Experiment with different cooking methods. Try roasting or broiling vegetables instead of steaming or sauteing them.  Add frozen vegetables to soups, stews, pasta, or rice.  Add nuts or seeds for added healthy fat at each meal. You can add these to yogurt, salads, or vegetable dishes.  Marinate fish or vegetables using olive oil, lemon juice, garlic, and fresh herbs. Meal planning   Plan to eat 1 vegetarian meal one day each week. Try to work up to 2 vegetarian meals, if possible.  Eat seafood 2 or more times a week.  Have healthy snacks readily available, such as: ? Vegetable sticks with hummus. ? [place] yogurt. ? Fruit and nut trail mix.  Eat balanced meals throughout the week. This includes: ? Fruit: 2-3 servings a day ? Vegetables: 4-5 servings a day ? Low-fat dairy: 2 servings a day ? Fish, poultry, or lean meat: 1 serving a day ? Beans and legumes: 2 or more servings a week ? Nuts and seeds: 1-2 servings a day ? Whole grains: 6-8 servings a day ? Extra-virgin olive oil: 3-4 servings a day  Limit red meat and sweets to only a few servings a month What are my food choices?  Mediterranean diet ? Recommended  Grains: Whole-grain pasta. Brown rice. Bulgar wheat. Polenta. Couscous. Whole-wheat bread. [place].  Vegetables: Artichokes.  Beets. Broccoli. Cabbage. Carrots. Eggplant. Green beans. Chard. Kale. Spinach. Onions. Leeks. Peas. Squash. Tomatoes. Peppers. Radishes.  Fruits: Apples. Apricots. Avocado. Berries. Bananas. Cherries. Dates. Figs. Grapes. Lemons. Melon. Oranges. Peaches. Plums. Pomegranate.  Meats and other protein foods: Beans. Almonds. Sunflower seeds. Pine nuts. Peanuts. [place]. Salmon. Scallops. Shrimp. [place]. Tilapia. Clams. Oysters. Eggs.  Dairy: Low-fat milk. Cheese. Greek yogurt.  Beverages: Water. Red wine. Herbal tea.  Fats and oils: Extra virgin olive oil. Avocado oil. Grape seed oil.  Sweets and desserts: [place] yogurt with honey. Baked apples. Poached pears. Trail mix.  Seasoning and other foods: Basil. Cilantro. Coriander. Cumin. Mint. Parsley. Sage. Rosemary. Tarragon. Garlic. Oregano. Thyme. Pepper. Balsalmic vinegar. Tahini. Hummus. Tomato sauce. Olives. Mushrooms. ? Limit these  Grains: Prepackaged pasta or rice dishes. Prepackaged cereal with added sugar.  Vegetables: Deep fried potatoes (french fries).  Fruits: Fruit canned in syrup.  Meats and other protein foods: Beef. Pork. Lamb. Poultry with skin. Hot dogs. Berniece Salines.  Dairy: Ice cream. Sour cream. Whole milk.  Beverages: Juice. Sugar-sweetened soft drinks.  Beer. Liquor and spirits.  Fats and oils: Butter. Canola oil. Vegetable oil. Beef fat (tallow). Lard.  Sweets and desserts: Cookies. Cakes. Pies. Candy.  Seasoning and other foods: Mayonnaise. Premade sauces and marinades. The items listed may not be a complete list. Talk with your dietitian about what dietary choices are right for you. Summary  The Mediterranean diet includes both food and lifestyle choices.  Eat a variety of fresh fruits and vegetables, beans, nuts, seeds, and whole grains.  Limit the amount of red meat and sweets that you eat.  Talk with your health care provider about whether it is safe for you to drink red wine in moderation. This means 1 glass a day  for nonpregnant women and 2 glasses a day for men. A glass of wine equals 5 oz (150 mL). This information is not intended to replace advice given to you by your health care provider. Make sure you discuss any questions you have with your health care provider. Document Revised: 10/29/2015 Document Reviewed: 10/22/2015 Elsevier Patient Education  2020 [employer].

## 2019-06-04 NOTE — Progress Notes (Signed)
Virginia Summers Date of Birth  [birth year]   [address]    [place]     [place], [place]  [address]      Problem list: 1. Hypothyroidism 2. Deep vein thrombosis 3. Pulmonary embolus 4. Hypertension 5. 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  This has been present since her PE.  She has noticed loss of indigestion-like pain for the past several months.  This happens typically at night when she is lying down.  She has been trying to lose weight but has not been able to lose weight consistently.  Nov. 20, 2013 - Virginia Summers has been about the same. She continues to have some shortness breath. She fell this past summer and broke her left ankle in 2 spots.  She also has some episodes of lightheadedness or dizziness.  Some aspects of these symptoms sound like vertigo.   These symptoms are not related to orthostasis.    September 12, 2012:  Virginia Summers is very emotional today - her husband passed away several months ago.  He was in the hospital for lung problems and apparently developed c-diff colitis.    He has continued to have worsening dyspnea.   She has also had some unsteadyness when walking  Nov. 9, 2015:  She ran out of her BP meds and restarted it back a week She has been on Prednisone for 5-6 months and has gained ~ 30 lbs.  Feb. 2, 2017:  Virginia Summers is doing ok Has had some swelling with amlodipine 10 mg  - she cut the dose to 5 mg and is feeling better. Has occasional palpitations that sound like PVCs  Has occasional jaw pain. Stopped taking her coumadin  - didn't like the cost and trouble of getting levels checked  We discussed  DOACs  Exercising  - walks her dog 3 times a day ( [place] =- poodle mix )   Virginia 28, 2018: Doing well .  Has lost 20 + lbs over the past several months  She ran out of her BP meds several weeks ago - BP is elevated.   October 09, 2017: Virginia Summers is seen today for follow-up visit. Has had several syncopal episodes since I last saw  her  Is having palpitations.  Does not exercise  Syncopal episodes.   Is out for a few seconds.  Has a 14 day event monitor on currently .   Feels like her heart races and then she gets lighthead.  Not related to exercise,  Not related to eating or   Also has some vertigo symptoms and symptoms or orthostasis .   No CP ,     Hx of of DVT and PE.    Was on Xarelto but she stopped due to cost.   Virginia 23, 2021:  Virginia Summers is seen today . Having some double vision.    Is having DOE  - does not exercise  Cleans her house Wt is 196 lbs.  ( up 12 lbs since last office visit)   Hx of pulmonary embolus.  She is not on anticoagulation  We have given her coumadin, xarelto, eliquis in the past .   She was hospitalized with respiratory distress.  Was thought to be a COPD exacerbation.  She was started on prednisone and antibiotics and has made some progress. Is on a prednisone taper.    Was started on amlodipine 10 mg a day .  She has had significant ankle swelling  Eats bacon weekly .     Current Outpatient Medications on File Prior to Visit  Medication Sig Dispense Refill  . albuterol (PROVENTIL HFA) 108 (90 BASE) MCG/ACT inhaler Inhale 2 puffs into the lungs every 4 (four) hours as needed. For shortness of breath.    Marland Kitchen amLODipine (NORVASC) 10 MG tablet Take 1 tablet (10 mg total) by mouth daily. 30 tablet 0  . atorvastatin (LIPITOR) 20 MG tablet Take 20 mg by mouth daily.    . budesonide-formoterol (SYMBICORT) 80-4.5 MCG/ACT inhaler Inhale 2 puffs into the lungs 2 (two) times daily.    . carvedilol (COREG) 12.5 MG tablet Take 12.5 mg by mouth 2 (two) times daily with a meal.    . chlorthalidone (HYGROTON) 25 MG tablet Take 0.5 tablets (12.5 mg total) by mouth daily. 15 tablet 11  . citalopram (CELEXA) 20 MG tablet Take 20 mg by mouth daily.     . DULoxetine (CYMBALTA) 30 MG capsule Take 30 mg by mouth daily.    . fluticasone (FLONASE) 50 MCG/ACT nasal spray Place 2 sprays into both  nostrils daily.    Marland Kitchen levothyroxine (SYNTHROID, LEVOTHROID) 25 MCG tablet TAKE 1 TABLET (25 MCG TOTAL) BY MOUTH DAILY. 30 tablet 0  . metFORMIN (GLUCOPHAGE-XR) 500 MG 24 hr tablet Take 500 mg by mouth 2 (two) times daily before a meal.    . potassium chloride (KLOR-CON) 10 MEQ tablet Take 1 tablet (10 mEq total) by mouth daily. 30 tablet 11  . predniSONE (DELTASONE) 1 MG tablet Take 2 tablets by mouth as directed. take 2 tablets by mouth once daily for 4 weeks, then 1 tablet once daily for 4 weeks,, then 1/2 tablet once for 4 weeks, then stop    . traZODone (DESYREL) 50 MG tablet Take 50 mg by mouth at bedtime as needed for sleep.   5  . valsartan (DIOVAN) 320 MG tablet Take 1 tablet (320 mg total) by mouth daily. 30 tablet 11   No current facility-administered medications on file prior to visit.    Allergies  Allergen Reactions  . Penicillins Swelling and Rash    Did it involve swelling of the face/tongue/throat, SOB, or low BP? No Did it involve sudden or severe rash/hives, skin peeling, or any reaction on the inside of your mouth or nose? No Did you need to seek medical attention at a hospital or doctor's office? No When did it last happen?childhood If all above answers are "NO", may proceed with cephalosporin use.  . Sulfa Drugs Cross Reactors Swelling and Rash    Past Medical History:  Diagnosis Date  . Anticoagulant long-term use   . Chest pain   . Diabetes mellitus   . Diverticulitis   . DVT (deep venous thrombosis) (HCC)    BILATERAL, [place] 2012  . GERD (gastroesophageal reflux disease)   . HTN (hypertension)   . Hypothyroidism   . Pulmonary embolism [facility])     Past Surgical History:  Procedure Laterality Date  . APPENDECTOMY  AGE 35  . BACK SURGERY  2000   DISC  . PARTIAL HYSTERECTOMY  AGE 13's APPROX.  Marland Kitchen SECONDARY INTRAOCULAR LENSE IMPLANTATION  2005,2006   OU  . SHOULDER SURGERY  5-6 YRS OLD   PINS    Social History   Tobacco Use  Smoking Status  Former Smoker  . Packs/day: 0.50  . Years: 30.00  . Pack years: 15.00  . Types: Cigarettes  . Quit date: 03/15/2003  .  Years since quitting: 16.2  Smokeless Tobacco Never Used    Social History   Substance and Sexual Activity  Alcohol Use No    Family History  Problem Relation Age of Onset  . Transient ischemic attack Father   . Coronary artery disease Father   . Stroke Mother   . Diabetes Mother   . Diabetes Sister   . Hypertension Daughter     Reviw of Systems:  Noted in current history, otherwise review of systems is negative.  Physical Exam: Blood pressure 128/62, pulse 76, height 4\' 11"  (1.499 m), weight 196 lb 12 oz (89.2 kg), SpO2 98 %. Body mass index is 39.74 kg/m.  GEN:   Elderly , moderately obese female   HEENT: Normal NECK: No JVD; No carotid bruits LYMPHATICS: No lymphadenopathy CARDIAC: RRR  RESPIRATORY:  Clear to auscultation without rales, wheezing or rhonchi  ABDOMEN: Soft, non-tender, non-distended MUSCULOSKELETAL  :  2 + bilat pitting edema  SKIN: Warm and dry NEUROLOGIC:  Alert and oriented x 3    ECG: Virginia 23, 2021: Normal sinus rhythm at 79.  Left bundle branch block.  Assessment / Plan:   1.    Hypertension-    BP is fairly well controlled but she has significant leg edema on the high-dose amlodipine which is not surprising.  We will decrease the amlodipine to 5 mg a day.  We will increase the chlorthalidone to 25 mg a day.  Continue current potassium chloride supplementation.  We will check a basic metabolic profile in 3 weeks for follow-up visit.  She is still eating salty foods.  She was told that she could eat bacon once a week.  I advised her to stay away from all salty foods including bacon, ham, sausage, bologna.  She is to grilled or baked her foods.  We will have her follow-up with Virginia 25, 2021, PA in 3 months.  2. Deep vein thrombosis / Pulmonary embolus -       She agrees to take Xarelto.   Will start Xarelto 10 mg a day for  DVT prophylaxis.      Tereso Newcomer, MD  06/04/2019 12:07 PM    [facility] [address] [place],  [address] [place], [place]  [place] Pager [phone] Phone: [phone]; Fax: [phone]

## 2019-06-26 ENCOUNTER — Telehealth: Payer: Self-pay | Admitting: Cardiovascular Disease

## 2019-06-26 NOTE — Telephone Encounter (Signed)
I spoke with patient and gave her information from Dr Elease Hashimoto.  I scheduled patient to see Dr Elease Hashimoto on May 25,2021 at 10:20.  She has previously scheduled appointment with Tereso Newcomer, PA on 6/23.  I told patient I would keep this appointment scheduled and it could be canceled after May 25 appointment if no longer needed. Patient reports she checked BP this afternoon and it is 143/60

## 2019-06-26 NOTE — Telephone Encounter (Signed)
Pt c/o swelling: STAT is pt has developed SOB within 24 hours  1) How much weight have you gained and in what time span? Gained 12 lbs in a year   2) If swelling, where is the swelling located? Feet & Ankles   3) Are you currently taking a fluid pill? Yes   4) Are you currently SOB? Yes, but not from swelling   5) Do you have a log of your daily weights (if so, list)? No   6) Have you gained 3 pounds in a day or 5 pounds in a week? Doesn't know   7) Have you traveled recently? No   Virginia Summers is calling stating she has been experiencing swelling in her ankles and feet. She states she noticed the swelling began when she started taking amlodipine. She is experiencing SOB, but not from the swelling. Please advise.

## 2019-06-26 NOTE — Telephone Encounter (Signed)
When I saw her a month ago. She was not active and was eating bacon and other salty foods.  She needs to avoid ALL salty foods and processed meats.   She should elevate her legs for 30 min a day using the   Lounge Doctor leg rest  ( available on [employer] or [employer]  She should be exercising - walking 2 - 3 miles a day several days a week .   Please see if she can be worked into a free spot to see me or an APP in several weeks .

## 2019-06-26 NOTE — Telephone Encounter (Signed)
I spoke with patient. She reports continued swelling in feet and ankles. Same as when she saw Dr Elease Hashimoto last month. No swelling in AM but develops as day goes on. She is very active.  I asked her to try and keep legs elevated during the day when possible.  She states BP has been elevated. Reports the following readings- 166/71,164/73,162/74, 153-157/70-71.  These are all taken prior to taking AM medications. She reports when she checks BP later in the day it is better but she has not been recording these readings. I asked patient to check BP and heart rate about 2-3 hours after taking AM medications and keep record. She will call these readings to the office next week. Patient is carefully watching her salt intake. She does not weigh daily and I asked her to begin weighing every AM.  She thinks she has lost weight recently. She is asking if she should continue amlodipine. I told her to continue and I would send message to Dr Elease Hashimoto regarding swelling/BP.  I told patient we would call her back if any changes recommended

## 2019-07-09 ENCOUNTER — Ambulatory Visit: Payer: Medicare HMO

## 2019-07-09 ENCOUNTER — Other Ambulatory Visit: Payer: Medicare HMO

## 2019-07-17 ENCOUNTER — Ambulatory Visit: Payer: Medicare HMO

## 2019-07-17 ENCOUNTER — Other Ambulatory Visit: Payer: Medicare HMO

## 2019-07-24 ENCOUNTER — Telehealth: Payer: Self-pay | Admitting: Cardiovascular Disease

## 2019-07-24 MED ORDER — HYDRALAZINE HCL 25 MG PO TABS: 25.0000 mg | ORAL_TABLET | Freq: Three times a day (TID) | ORAL | 11 refills | Status: DC

## 2019-07-24 NOTE — Telephone Encounter (Signed)
Patient c/o Palpitations:  High priority if patient c/o lightheadedness, shortness of breath, or chest pain  1) How long have you had palpitations/irregular HR/ Afib? Are you having the symptoms now? Palpitations began 30 minutes ago. However, she states she is not currently experiencing any symptoms.  2) Are you currently experiencing lightheadedness, SOB or CP? Lightheadedness  3) Do you have a history of afib (atrial fibrillation) or irregular heart rhythm? No  4) Have you checked your BP or HR? (document readings if available):  BP: 177/77   5) Are you experiencing any other symptoms? Dizziness    STAT if patient feels like he/she is going to faint   1) Are you dizzy now? Patient states she feels "a little" dizzy  2) Do you feel faint or have you passed out? No  3) Do you have any other symptoms? No  4) Have you checked your HR and BP (record if available)? BP: 177/77

## 2019-07-24 NOTE — Telephone Encounter (Signed)
Called patient and reviewed Dr. Harvie Bridge advice with her. She reports she is careful about dietary sodium and is trying to exercise more but she has been unable to walk over the past few days due to foot swelling. She has purchased the lounge doctor leg rest and agrees to use it when she is sedentary.  I advised her to stop amlodipine and start hydralazine 25 mg 3 times per day. She is using a wrist BP cuff. I asked if she has one for her upper arm and she states she does but she has to use it on the lower part of her arm due to the size of her arm. Reports SBP has gradually increased since she saw Dr. Elease Hashimoto but she gets better readings after she takes her medications. I advised her to bring it with her to her appointment on 5/25 so that we can compare it to ours. She asks about a heart monitor and I advised her to call back prior to appointment if she has additional episodes of palpitations. She verbalized understanding and agreement with plan and thanked me for the call.

## 2019-07-24 NOTE — Telephone Encounter (Signed)
Her blood pressure was normal just 2 months ago when I saw her.  She states that she is cut out her salt.  It does not make sense that her blood pressure is this high.  I suspect that she may have some problems with her blood pressure cuff and I would recommend that she get a new one.  We will stop the amlodipine which should help with her leg swelling.  She needs to elevate her legs higher than her heart and also need to get some compression hose.  She needs to ambulate.  We will start her on hydralazine 25 mg 3 times a day.

## 2019-07-24 NOTE — Telephone Encounter (Signed)
Spoke with patient who states she was sitting down and felt "pop pop pop" in her chest. States she also has increased swelling in both lower extremities, feet are so swollen that it is painful to walk on them. She asks if the amlodipine is causing her feet to swell because she has experienced this in the past.  Reports SBP is running >160 mmHg recently and she does not feel like she urinating a lot since starting chlorthalidone.  States she noticed the episode of chest of "popping" this morning after taking her medications at about 0830 and just hasn't felt right all day. I advised that she last wore a heart monitor 2 years ago that showed some SVT but that we do not have a fib documented. She is agreeable to wearing another cardiac monitor. Asks if she could be having a heart attack. She denies chest pain or discomfort. I advised that if she has symptoms that worsen or are concerning to her she should call 911. She would also like to stop amlodipine and take something different for BP. I advised that I will review all of this with Dr. Elease Hashimoto and call her back with his advice. She verbalized understanding and agreement with plan and thanked me for the call.

## 2019-08-06 ENCOUNTER — Ambulatory Visit: Payer: Medicare HMO | Admitting: Cardiovascular Disease

## 2019-09-04 ENCOUNTER — Ambulatory Visit: Payer: Medicare HMO | Admitting: Physician Assistant

## 2019-12-03 ENCOUNTER — Other Ambulatory Visit: Payer: Self-pay

## 2019-12-03 MED ORDER — VALSARTAN 320 MG PO TABS: 320.0000 mg | ORAL_TABLET | Freq: Every day | ORAL | 1 refills | Status: DC

## 2020-04-13 ENCOUNTER — Telehealth: Payer: Self-pay | Admitting: Cardiovascular Disease

## 2020-04-13 MED ORDER — CARVEDILOL 12.5 MG PO TABS: 12.5000 mg | ORAL_TABLET | Freq: Two times a day (BID) | ORAL | 3 refills | Status: DC

## 2020-04-13 NOTE — Telephone Encounter (Signed)
Patient called and wanted Dr. Elease Hashimoto to know that she was having palpitations which started at 8:30-9am today; stated that when she got her groceries delivered this am, she stated to feel dizziness and a floating feeling; she stated at times sitting down will help this but today; her symptoms have not improved. Pt started that she could feel her palpitations in her neck, her b/p this am was 136/62 and her HR is 45. Pt stated that she was not having any other symptoms like N/V; she stated that she has sob, but no cp, states that her SOB is her normal d/t her asthma and bronchitis.Pt stated that she took her medications 45 minutes ago, her b/p this am was 167/76, HR 58. Rn asked if she wanted to come into today, and stated that she didn't have transportation today;but could come in any other time this week,  her daughter provides her transportation. Pt stated that she did not have any syncope or presyncopal episodes. Main compliant is palpitations and SOB. Pt did add that she doesn't drink enough water, and doesn't have much of an appetite, also, stated that her blood sugar was 218 this am, and she had not taken metformin, because she was taken off of it a month ago. Very unclear on how she is managing her diabetes. Pt verbalized that she is not in any acute distress at this time.  Rn stated she would speak to Dr. Elease Hashimoto, and after getting his advise would call her back, if symptoms got worse to please call back or go to ER.

## 2020-04-13 NOTE — Telephone Encounter (Signed)
Returned call to patient. She states she is feeling a little better than earlier this morning. I asked about medications and she states she has run out of some of her medications - she is unsure which ones except she knows she is out of carvedilol. I advised that stopping carvedilol could be the cause of the increased palpitations and advised that I am sending her a refill. I offered her an appointment tomorrow which she is unable to accept due to transportation. I scheduled her to see Dr. Elease Hashimoto on Friday 2/4. I asked her to bring her prescription bottles so that we can verify she has correct medications. She verbalized understanding and agreement with plan and thanked me for the call.

## 2020-04-13 NOTE — Telephone Encounter (Signed)
Patient c/o Palpitations:  High priority if patient c/o lightheadedness, shortness of breath, or chest pain  1) How long have you had palpitations/irregular HR/ Afib? Are you having the symptoms now? Yes, last week  2) Are you currently experiencing lightheadedness, SOB or CP? Lightheaded, SOB but also has asthma and bronchitis   3) Do you have a history of afib (atrial fibrillation) or irregular heart rhythm? yes  4) Have you checked your BP or HR? (document readings if available): 167/76 HR 58  5) Are you experiencing any other symptoms? No   Patient states since last week she has been feeling her heart beating funny. She states she is not sure if it is skipping beats or palpitations. She states she does feel lightheaded and is sitting down. She states she feels like if she stands up she will get more dizzy. She states when her symptoms start she feels hot. She states she was not sure if it was her medications, but she has waited to take them and still gets symptoms. She states she also has not eaten today.

## 2020-04-16 ENCOUNTER — Encounter: Payer: Self-pay | Admitting: Cardiovascular Disease

## 2020-04-16 NOTE — Progress Notes (Signed)
Virginia Summers Date of Birth  [birth year]   [address]    [place]     [place], [place]  [address]      Problem list: 1. Hypothyroidism 2. Deep vein thrombosis 3. Pulmonary embolus 4. Hypertension 5. 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  This has been present since her PE.  She has noticed loss of indigestion-like pain for the past several months.  This happens typically at night when she is lying down.  She has been trying to lose weight but has not been able to lose weight consistently.  Nov. 20, 2013 - [place] Summers has been about the same. She continues to have some shortness breath. She fell this past summer and broke her left ankle in 2 spots.  She also has some episodes of lightheadedness or dizziness.  Some aspects of these symptoms sound like vertigo.   These symptoms are not related to orthostasis.    September 12, 2012:  Virginia Summers is very emotional today - her husband passed away several months ago.  He was in the hospital for lung problems and apparently developed c-diff colitis.    He has continued to have worsening dyspnea.   She has also had some unsteadyness when walking  Nov. 9, 2015:  She ran out of her BP meds and restarted it back a week She has been on Prednisone for 5-6 months and has gained ~ 30 lbs.  Feb. 2, 2017:  Virginia Summers is doing ok Has had some swelling with amlodipine 10 mg  - she cut the dose to 5 mg and is feeling better. Has occasional palpitations that sound like PVCs  Has occasional jaw pain. Stopped taking her coumadin  - didn't like the cost and trouble of getting levels checked  We discussed  DOACs  Exercising  - walks her dog 3 times a day ( [place] =- poodle mix )   Virginia 28, 2018: Doing well .  Has lost 20 + lbs over the past several months  She ran out of her BP meds several weeks ago - BP is elevated.   October 09, 2017: [place] Summers is seen today for follow-up visit. Has had several syncopal episodes since I last saw  her  Is having palpitations.  Does not exercise  Syncopal episodes.   Is out for a few seconds.  Has a 14 day event monitor on currently .   Feels like her heart races and then she gets lighthead.  Not related to exercise,  Not related to eating or   Also has some vertigo symptoms and symptoms or orthostasis .   No CP ,     Hx of of DVT and PE.    Was on Xarelto but she stopped due to cost.   Virginia 23, 2021:  [place]ia Summers is seen today . Having some double vision.    Is having DOE  - does not exercise  Cleans her house Wt is 196 lbs.  ( up 12 lbs since last office visit)   Hx of pulmonary embolus.  She is not on anticoagulation  We have given her coumadin, xarelto, eliquis in the past .   She was hospitalized with respiratory distress.  Was thought to be a COPD exacerbation.  She was started on prednisone and antibiotics and has made some progress. Is on a prednisone taper.    Was started on amlodipine 10 mg a day .  She has had significant ankle swelling  Eats bacon weekly .   Feb. 4, 2022 [place] Summers is here as a work in visit She has been having more palpitations  Wt. =  190 lbs   These palpitations  Occur spontaneously .   Last for 1-3 minutes.  Associated with dizziness and near syncope  occus about every day   Avoids salty Is not exercising  Is eating a litter better     Current Outpatient Medications on File Prior to Visit  Medication Sig Dispense Refill  . albuterol (PROVENTIL HFA) 108 (90 BASE) MCG/ACT inhaler Inhale 2 puffs into the lungs every 4 (four) hours as needed. For shortness of breath.    Marland Kitchen atorvastatin (LIPITOR) 20 MG tablet Take 20 mg by mouth daily.    . budesonide-formoterol (SYMBICORT) 80-4.5 MCG/ACT inhaler Inhale 2 puffs into the lungs 2 (two) times daily.    . carvedilol (COREG) 12.5 MG tablet Take 1 tablet (12.5 mg total) by mouth 2 (two) times daily with a meal. 180 tablet 3  . chlorthalidone (HYGROTON) 25 MG tablet Take 1 tablet (25 mg total) by  mouth daily. 90 tablet 3  . citalopram (CELEXA) 20 MG tablet Take 20 mg by mouth daily.     . DULoxetine (CYMBALTA) 30 MG capsule Take 30 mg by mouth daily.    . fluticasone (FLONASE) 50 MCG/ACT nasal spray Place 2 sprays into both nostrils daily.    . hydrALAZINE (APRESOLINE) 25 MG tablet Take 1 tablet (25 mg total) by mouth 3 (three) times daily. 90 tablet 11  . levothyroxine (SYNTHROID, LEVOTHROID) 25 MCG tablet TAKE 1 TABLET (25 MCG TOTAL) BY MOUTH DAILY. 30 tablet 0  . metFORMIN (GLUCOPHAGE-XR) 500 MG 24 hr tablet Take 500 mg by mouth 2 (two) times daily before a meal.    . potassium chloride (KLOR-CON) 10 MEQ tablet Take 1 tablet (10 mEq total) by mouth daily. 30 tablet 11  . predniSONE (DELTASONE) 1 MG tablet Take 2 tablets by mouth as directed. take 2 tablets by mouth once daily for 4 weeks, then 1 tablet once daily for 4 weeks,, then 1/2 tablet once for 4 weeks, then stop    . rivaroxaban (XARELTO) 10 MG TABS tablet Take 1 tablet (10 mg total) by mouth daily. 30 tablet 11  . traZODone (DESYREL) 50 MG tablet Take 50 mg by mouth at bedtime as needed for sleep.   5  . valsartan (DIOVAN) 320 MG tablet Take 1 tablet (320 mg total) by mouth daily. 90 tablet 1   No current facility-administered medications on file prior to visit.    Allergies  Allergen Reactions  . Penicillins Swelling and Rash    Did it involve swelling of the face/tongue/throat, SOB, or low BP? No Did it involve sudden or severe rash/hives, skin peeling, or any reaction on the inside of your mouth or nose? No Did you need to seek medical attention at a hospital or doctor's office? No When did it last happen?childhood If all above answers are "NO", may proceed with cephalosporin use.  . Sulfa Drugs Cross Reactors Swelling and Rash    Past Medical History:  Diagnosis Date  . Anticoagulant long-term use   . Chest pain   . Diabetes mellitus   . Diverticulitis   . DVT (deep venous thrombosis) (HCC)     BILATERAL, [place] 2012  . GERD (gastroesophageal reflux disease)   . HTN (hypertension)   . Hypothyroidism   . Pulmonary embolism (HCC)  Past Surgical History:  Procedure Laterality Date  . APPENDECTOMY  AGE 3  . BACK SURGERY  2000   DISC  . PARTIAL HYSTERECTOMY  AGE 36's APPROX.  Marland Kitchen SECONDARY INTRAOCULAR LENSE IMPLANTATION  2005,2006   OU  . SHOULDER SURGERY  5-6 YRS OLD   PINS    Social History   Tobacco Use  Smoking Status Former Smoker  . Packs/day: 0.50  . Years: 30.00  . Pack years: 15.00  . Types: Cigarettes  . Quit date: 03/15/2003  . Years since quitting: 17.1  Smokeless Tobacco Never Used    Social History   Substance and Sexual Activity  Alcohol Use No    Family History  Problem Relation Age of Onset  . Transient ischemic attack Father   . Coronary artery disease Father   . Stroke Mother   . Diabetes Mother   . Diabetes Sister   . Hypertension Daughter     Reviw of Systems:  Noted in current history, otherwise review of systems is negative.  Physical Exam: There were no vitals taken for this visit.  GEN:  Well nourished, well developed in no acute distress HEENT: Normal NECK: No JVD; No carotid bruits LYMPHATICS: No lymphadenopathy CARDIAC: RRR , no murmurs, rubs, gallops RESPIRATORY:  Clear to auscultation without rales, wheezing or rhonchi  ABDOMEN: Soft, non-tender, non-distended MUSCULOSKELETAL:  No edema; No deformity  SKIN: Warm and dry NEUROLOGIC:  Alert and oriented x 3     ECG: April 17, 2020: Normal sinus rhythm at 84.  Left bundle branch block.  No changes from previous EKG.   Assessment / Plan:   1.    Hypertension-     Blood pressures fairly well controlled.  Continue current medications.  2. Deep vein thrombosis / Pulmonary embolus -      Continue  Xarelto.  3.  Palpitations: Has been having intermittent episodes of palpitations.  These last for several minutes.  We will check a TSH, basic metabolic profile,  ALT, lipids. We will place a 14-day monitor on her for further evaluation.  She will see an APP in 3 months    Kristeen Miss, MD  04/16/2020 4:12 PM    [facility] [address] [place],  [address] [place], [place]  [address] Pager [phone] Phone: [phone]; Fax: [phone]

## 2020-04-17 ENCOUNTER — Encounter: Payer: Self-pay | Admitting: Radiology

## 2020-04-17 ENCOUNTER — Other Ambulatory Visit: Payer: Self-pay

## 2020-04-17 ENCOUNTER — Encounter: Payer: Self-pay | Admitting: Cardiovascular Disease

## 2020-04-17 ENCOUNTER — Ambulatory Visit (INDEPENDENT_AMBULATORY_CARE_PROVIDER_SITE_OTHER): Payer: Medicare HMO

## 2020-04-17 ENCOUNTER — Ambulatory Visit: Payer: Medicare HMO | Admitting: Cardiovascular Disease

## 2020-04-17 VITALS — BP 144/70 | HR 84 | Ht 59.0 in | Wt 190.0 lb

## 2020-04-17 DIAGNOSIS — I2699 Other pulmonary embolism without acute cor pulmonale: Secondary | ICD-10-CM | POA: Diagnosis not present

## 2020-04-17 DIAGNOSIS — Z5181 Encounter for therapeutic drug level monitoring: Secondary | ICD-10-CM

## 2020-04-17 DIAGNOSIS — I10 Essential (primary) hypertension: Secondary | ICD-10-CM

## 2020-04-17 DIAGNOSIS — R002 Palpitations: Secondary | ICD-10-CM

## 2020-04-17 NOTE — Patient Instructions (Signed)
Medication Instructions:  Your physician recommends that you continue on your current medications as directed. Please refer to the Current Medication list given to you today.  *If you need a refill on your cardiac medications before your next appointment, please call your pharmacy*   Lab Work: Today: TSH, BMP, Lipids, ALT If you have labs (blood work) drawn today and your tests are completely normal, you will receive your results only by: Marland Kitchen MyChart Message (if you have MyChart) OR . A paper copy in the mail If you have any lab test that is abnormal or we need to change your treatment, we will call you to review the results.   Testing/Procedures: Your physician has recommended that you wear an event monitor. Event monitors are medical devices that record the heart's electrical activity. Doctors most often [place] these monitors to diagnose arrhythmias. Arrhythmias are problems with the speed or rhythm of the heartbeat. The monitor is a small, portable device. You can wear one while you do your normal daily activities. This is usually used to diagnose what is causing palpitations/syncope (passing out).    Follow-Up: At [facility], you and your health needs are our priority.  As part of our continuing mission to provide you with exceptional heart care, we have created designated Provider Care Teams.  These Care Teams include your primary Cardiologist (physician) and Advanced Practice Providers (APPs -  Physician Assistants and Nurse Practitioners) who all work together to provide you with the care you need, when you need it.  Your next appointment:   3 month(s)  The format for your next appointment:   In Person  Provider:   You will see one of the following Advanced Practice Providers on your designated Care Team:    Tereso Newcomer, PA-C  Vin Bhagat, [place]    Other Instructions ZIO XT- Long Term Monitor Instructions   Your physician has requested you wear your ZIO patch monitor 14  days.   This is a single patch monitor.  [employer] supplies one patch monitor per enrollment.  Additional stickers are not available.   Please do not apply patch if you will be having a Nuclear Stress Test, Echocardiogram, Cardiac CT, MRI, or Chest Xray during the time frame you would be wearing the monitor. The patch cannot be worn during these tests.  You cannot remove and re-apply the ZIO XT patch monitor.   Your ZIO patch monitor will be sent USPS Priority mail from [facility] directly to your home address. The monitor may also be mailed to a PO BOX if home delivery is not available.   It may take 3-5 days to receive your monitor after you have been enrolled.   Once you have received you monitor, please review enclosed instructions.  Your monitor has already been registered assigning a specific monitor serial # to you.   Applying the monitor   Shave hair from upper left chest.   Hold abrader disc by orange tab.  Rub abrader in 40 strokes over left upper chest as indicated in your monitor instructions.   Clean area with 4 enclosed alcohol pads .  Use all pads to assure are is cleaned thoroughly.  Let dry.   Apply patch as indicated in monitor instructions.  Patch will be place under collarbone on left side of chest with arrow pointing upward.   Rub patch adhesive wings for 2 minutes.Remove white label marked "1".  Remove white label marked "2".  Rub patch adhesive wings for 2 additional minutes.  While looking in a mirror, press and release button in center of patch.  A small green light will flash 3-4 times .  This will be your only indicator the monitor has been turned on.     Do not shower for the first 24 hours.  You may shower after the first 24 hours.   Press button if you feel a symptom. You will hear a small click.  Record Date, Time and Symptom in the Patient Log Book.   When you are ready to remove patch, follow instructions on last 2 pages of Patient Log Book.   Stick patch monitor onto last page of Patient Log Book.   Place Patient Log Book in Commodore box.  Use locking tab on box and tape box closed securely.  The [employer] and [employer] has [employer] on it.  Please place in mailbox as soon as possible.  Your physician should have your test results approximately 7 days after the monitor has been mailed back to [facility].   Call [place] at [phone] if you have questions regarding your ZIO XT patch monitor.  Call them immediately if you see an orange light blinking on your monitor.   If your monitor falls off in less than 4 days contact our Monitor department at [phone].  If your monitor becomes loose or falls off after 4 days call [employer] at [phone] for suggestions on securing your monitor.

## 2020-04-17 NOTE — Progress Notes (Signed)
Enrolled patient for a 14 day Zio XT Monitor to be mailed to patients home  

## 2020-04-18 LAB — BASIC METABOLIC PANEL
BUN/Creatinine Ratio: 17 (ref 12–28)
BUN: 20 mg/dL (ref 8–27)
CO2: 27 mmol/L (ref 20–29)
Calcium: 9.1 mg/dL (ref 8.7–10.3)
Chloride: 100 mmol/L (ref 96–106)
Creatinine, Ser: 1.19 mg/dL — ABNORMAL HIGH (ref 0.57–1.00)
GFR calc Af Amer: 50 mL/min/{1.73_m2} — ABNORMAL LOW (ref >59)
GFR calc non Af Amer: 43 mL/min/{1.73_m2} — ABNORMAL LOW (ref >59)
Glucose: 181 mg/dL — ABNORMAL HIGH (ref 65–99)
Potassium: 4 mmol/L (ref 3.5–5.2)
Sodium: 141 mmol/L (ref 134–144)

## 2020-04-18 LAB — LIPID PANEL
Chol/HDL Ratio: 4.3 ratio (ref 0.0–4.4)
Cholesterol, Total: 175 mg/dL (ref 100–199)
HDL: 41 mg/dL (ref >39)
LDL Chol Calc (NIH): 71 mg/dL (ref 0–99)
Triglycerides: 404 mg/dL — ABNORMAL HIGH (ref 0–149)
VLDL Cholesterol Cal: 63 mg/dL — ABNORMAL HIGH (ref 5–40)

## 2020-04-18 LAB — TSH: TSH: 0.671 u[IU]/mL (ref 0.450–4.500)

## 2020-04-18 LAB — ALT: ALT: 12 IU/L (ref 0–32)

## 2020-04-20 ENCOUNTER — Telehealth: Payer: Self-pay

## 2020-04-20 DIAGNOSIS — E782 Mixed hyperlipidemia: Secondary | ICD-10-CM

## 2020-04-20 DIAGNOSIS — Z79899 Other long term (current) drug therapy: Secondary | ICD-10-CM

## 2020-04-20 MED ORDER — FENOFIBRATE 145 MG PO TABS: 145.0000 mg | ORAL_TABLET | Freq: Every day | ORAL | 3 refills | Status: DC

## 2020-04-20 NOTE — Telephone Encounter (Signed)
-----   Message from Vesta Mixer, MD sent at 04/20/2020  8:56 AM EST ----- TSH is normal Glucose is elevated BMP is otherwise stable Trigs remain extremely elevated.   She needs to watch her intake of carbs and fats.   Start Fenofibrate 145 mg a day  Check lipids, ALT, BMP in 3 months

## 2020-04-20 NOTE — Telephone Encounter (Signed)
The patient has been notified of the result and verbalized understanding.  All questions (if any) were answered. Ethelda Chick, RN 04/20/2020 5:00 PM   Order in for Fenofibrate 145 mg daily and lab work.

## 2020-05-03 DIAGNOSIS — I10 Essential (primary) hypertension: Secondary | ICD-10-CM | POA: Diagnosis not present

## 2020-05-03 DIAGNOSIS — R002 Palpitations: Secondary | ICD-10-CM

## 2020-05-03 DIAGNOSIS — Z5181 Encounter for therapeutic drug level monitoring: Secondary | ICD-10-CM

## 2020-05-09 ENCOUNTER — Other Ambulatory Visit: Payer: Self-pay | Admitting: Cardiovascular Disease

## 2020-06-20 ENCOUNTER — Other Ambulatory Visit: Payer: Self-pay | Admitting: Cardiovascular Disease

## 2020-06-22 NOTE — Telephone Encounter (Signed)
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

## 2020-07-15 ENCOUNTER — Ambulatory Visit: Payer: Medicare HMO | Admitting: Physician Assistant

## 2020-07-18 ENCOUNTER — Other Ambulatory Visit: Payer: Self-pay | Admitting: Cardiovascular Disease

## 2020-09-09 ENCOUNTER — Other Ambulatory Visit: Payer: Self-pay | Admitting: Cardiovascular Disease

## 2020-09-28 ENCOUNTER — Ambulatory Visit: Payer: Medicare HMO | Admitting: Physician Assistant

## 2020-11-17 ENCOUNTER — Emergency Department (HOSPITAL_COMMUNITY): Payer: Medicare HMO

## 2020-11-17 ENCOUNTER — Encounter (HOSPITAL_COMMUNITY): Payer: Self-pay | Admitting: Internal Medicine

## 2020-11-17 ENCOUNTER — Inpatient Hospital Stay (HOSPITAL_COMMUNITY)
Admission: EM | Admit: 2020-11-17 | Discharge: 2020-11-26 | DRG: 304 | Disposition: A | Discharge status: Home Health | Payer: Medicare HMO | Attending: Internal Medicine | Admitting: Internal Medicine

## 2020-11-17 ENCOUNTER — Other Ambulatory Visit: Payer: Self-pay

## 2020-11-17 ENCOUNTER — Telehealth: Payer: Self-pay | Admitting: Cardiovascular Disease

## 2020-11-17 ENCOUNTER — Emergency Department (HOSPITAL_BASED_OUTPATIENT_CLINIC_OR_DEPARTMENT_OTHER): Payer: Medicare HMO

## 2020-11-17 DIAGNOSIS — Z6838 Body mass index (BMI) 38.0-38.9, adult: Secondary | ICD-10-CM

## 2020-11-17 DIAGNOSIS — I2699 Other pulmonary embolism without acute cor pulmonale: Secondary | ICD-10-CM | POA: Diagnosis present

## 2020-11-17 DIAGNOSIS — Z833 Family history of diabetes mellitus: Secondary | ICD-10-CM

## 2020-11-17 DIAGNOSIS — I160 Hypertensive urgency: Principal | ICD-10-CM | POA: Diagnosis present

## 2020-11-17 DIAGNOSIS — E1122 Type 2 diabetes mellitus with diabetic chronic kidney disease: Secondary | ICD-10-CM | POA: Diagnosis present

## 2020-11-17 DIAGNOSIS — J449 Chronic obstructive pulmonary disease, unspecified: Secondary | ICD-10-CM | POA: Diagnosis present

## 2020-11-17 DIAGNOSIS — N179 Acute kidney failure, unspecified: Secondary | ICD-10-CM | POA: Diagnosis present

## 2020-11-17 DIAGNOSIS — R072 Precordial pain: Secondary | ICD-10-CM | POA: Insufficient documentation

## 2020-11-17 DIAGNOSIS — U071 COVID-19: Secondary | ICD-10-CM | POA: Diagnosis not present

## 2020-11-17 DIAGNOSIS — B961 Klebsiella pneumoniae [K. pneumoniae] as the cause of diseases classified elsewhere: Secondary | ICD-10-CM | POA: Diagnosis present

## 2020-11-17 DIAGNOSIS — N390 Urinary tract infection, site not specified: Secondary | ICD-10-CM | POA: Diagnosis present

## 2020-11-17 DIAGNOSIS — Z7901 Long term (current) use of anticoagulants: Secondary | ICD-10-CM

## 2020-11-17 DIAGNOSIS — Z79890 Hormone replacement therapy (postmenopausal): Secondary | ICD-10-CM

## 2020-11-17 DIAGNOSIS — M79604 Pain in right leg: Secondary | ICD-10-CM | POA: Diagnosis not present

## 2020-11-17 DIAGNOSIS — E11649 Type 2 diabetes mellitus with hypoglycemia without coma: Secondary | ICD-10-CM | POA: Diagnosis not present

## 2020-11-17 DIAGNOSIS — E1169 Type 2 diabetes mellitus with other specified complication: Secondary | ICD-10-CM | POA: Diagnosis present

## 2020-11-17 DIAGNOSIS — Z8249 Family history of ischemic heart disease and other diseases of the circulatory system: Secondary | ICD-10-CM

## 2020-11-17 DIAGNOSIS — E669 Obesity, unspecified: Secondary | ICD-10-CM | POA: Diagnosis present

## 2020-11-17 DIAGNOSIS — Z7984 Long term (current) use of oral hypoglycemic drugs: Secondary | ICD-10-CM

## 2020-11-17 DIAGNOSIS — Z87891 Personal history of nicotine dependence: Secondary | ICD-10-CM

## 2020-11-17 DIAGNOSIS — J9601 Acute respiratory failure with hypoxia: Secondary | ICD-10-CM | POA: Diagnosis not present

## 2020-11-17 DIAGNOSIS — E039 Hypothyroidism, unspecified: Secondary | ICD-10-CM | POA: Diagnosis present

## 2020-11-17 DIAGNOSIS — Z86718 Personal history of other venous thrombosis and embolism: Secondary | ICD-10-CM

## 2020-11-17 DIAGNOSIS — R609 Edema, unspecified: Secondary | ICD-10-CM

## 2020-11-17 DIAGNOSIS — E871 Hypo-osmolality and hyponatremia: Secondary | ICD-10-CM | POA: Diagnosis present

## 2020-11-17 DIAGNOSIS — J9690 Respiratory failure, unspecified, unspecified whether with hypoxia or hypercapnia: Secondary | ICD-10-CM | POA: Diagnosis present

## 2020-11-17 DIAGNOSIS — Z8616 Personal history of COVID-19: Secondary | ICD-10-CM

## 2020-11-17 DIAGNOSIS — T502X5A Adverse effect of carbonic-anhydrase inhibitors, benzothiadiazides and other diuretics, initial encounter: Secondary | ICD-10-CM | POA: Diagnosis present

## 2020-11-17 DIAGNOSIS — E860 Dehydration: Secondary | ICD-10-CM | POA: Diagnosis present

## 2020-11-17 DIAGNOSIS — E041 Nontoxic single thyroid nodule: Secondary | ICD-10-CM | POA: Diagnosis present

## 2020-11-17 DIAGNOSIS — K219 Gastro-esophageal reflux disease without esophagitis: Secondary | ICD-10-CM | POA: Diagnosis present

## 2020-11-17 DIAGNOSIS — Z86711 Personal history of pulmonary embolism: Secondary | ICD-10-CM

## 2020-11-17 DIAGNOSIS — N1831 Chronic kidney disease, stage 3a: Secondary | ICD-10-CM | POA: Diagnosis present

## 2020-11-17 DIAGNOSIS — Z9049 Acquired absence of other specified parts of digestive tract: Secondary | ICD-10-CM

## 2020-11-17 DIAGNOSIS — D631 Anemia in chronic kidney disease: Secondary | ICD-10-CM | POA: Diagnosis present

## 2020-11-17 DIAGNOSIS — Z79899 Other long term (current) drug therapy: Secondary | ICD-10-CM

## 2020-11-17 DIAGNOSIS — Z9114 Patient's other noncompliance with medication regimen: Secondary | ICD-10-CM

## 2020-11-17 DIAGNOSIS — R030 Elevated blood-pressure reading, without diagnosis of hypertension: Secondary | ICD-10-CM

## 2020-11-17 DIAGNOSIS — I129 Hypertensive chronic kidney disease with stage 1 through stage 4 chronic kidney disease, or unspecified chronic kidney disease: Secondary | ICD-10-CM | POA: Diagnosis present

## 2020-11-17 DIAGNOSIS — Z90711 Acquired absence of uterus with remaining cervical stump: Secondary | ICD-10-CM

## 2020-11-17 DIAGNOSIS — E875 Hyperkalemia: Secondary | ICD-10-CM | POA: Diagnosis present

## 2020-11-17 DIAGNOSIS — Z880 Allergy status to penicillin: Secondary | ICD-10-CM

## 2020-11-17 DIAGNOSIS — Z823 Family history of stroke: Secondary | ICD-10-CM

## 2020-11-17 DIAGNOSIS — M1711 Unilateral primary osteoarthritis, right knee: Secondary | ICD-10-CM | POA: Diagnosis present

## 2020-11-17 DIAGNOSIS — E662 Morbid (severe) obesity with alveolar hypoventilation: Secondary | ICD-10-CM | POA: Diagnosis present

## 2020-11-17 DIAGNOSIS — E785 Hyperlipidemia, unspecified: Secondary | ICD-10-CM | POA: Diagnosis present

## 2020-11-17 DIAGNOSIS — I447 Left bundle-branch block, unspecified: Secondary | ICD-10-CM | POA: Diagnosis present

## 2020-11-17 LAB — BRAIN NATRIURETIC PEPTIDE: B Natriuretic Peptide: 258.7 pg/mL — ABNORMAL HIGH (ref 0.0–100.0)

## 2020-11-17 LAB — LIPASE, BLOOD: Lipase: 34 U/L (ref 11–51)

## 2020-11-17 LAB — CBC WITH DIFFERENTIAL/PLATELET
Abs Immature Granulocytes: 0.03 10*3/uL (ref 0.00–0.07)
Basophils Absolute: 0.1 10*3/uL (ref 0.0–0.1)
Basophils Relative: 1 %
Eosinophils Absolute: 0.1 10*3/uL (ref 0.0–0.5)
Eosinophils Relative: 1 %
HCT: 36.3 % (ref 36.0–46.0)
Hemoglobin: 11.1 g/dL — ABNORMAL LOW (ref 12.0–15.0)
Immature Granulocytes: 0 %
Lymphocytes Relative: 29 %
Lymphs Abs: 2.6 10*3/uL (ref 0.7–4.0)
MCH: 26.1 pg (ref 26.0–34.0)
MCHC: 30.6 g/dL (ref 30.0–36.0)
MCV: 85.4 fL (ref 80.0–100.0)
Monocytes Absolute: 0.6 10*3/uL (ref 0.1–1.0)
Monocytes Relative: 7 %
Neutro Abs: 5.6 10*3/uL (ref 1.7–7.7)
Neutrophils Relative %: 62 %
Platelets: 293 10*3/uL (ref 150–400)
RBC: 4.25 MIL/uL (ref 3.87–5.11)
RDW: 14.3 % (ref 11.5–15.5)
WBC: 9 10*3/uL (ref 4.0–10.5)
nRBC: 0 % (ref 0.0–0.2)

## 2020-11-17 LAB — CBG MONITORING, ED: Glucose-Capillary: 159 mg/dL — ABNORMAL HIGH (ref 70–99)

## 2020-11-17 LAB — BASIC METABOLIC PANEL
Anion gap: 6 (ref 5–15)
BUN: 24 mg/dL — ABNORMAL HIGH (ref 8–23)
CO2: 23 mmol/L (ref 22–32)
Calcium: 8 mg/dL — ABNORMAL LOW (ref 8.9–10.3)
Chloride: 109 mmol/L (ref 98–111)
Creatinine, Ser: 1.05 mg/dL — ABNORMAL HIGH (ref 0.44–1.00)
GFR, Estimated: 54 mL/min — ABNORMAL LOW (ref >60)
Glucose, Bld: 153 mg/dL — ABNORMAL HIGH (ref 70–99)
Potassium: 4.8 mmol/L (ref 3.5–5.1)
Sodium: 138 mmol/L (ref 135–145)

## 2020-11-17 LAB — RESP PANEL BY RT-PCR (FLU A&B, COVID) ARPGX2
Influenza A by PCR: NEGATIVE
Influenza B by PCR: NEGATIVE
SARS Coronavirus 2 by RT PCR: POSITIVE — AB

## 2020-11-17 LAB — HEPATIC FUNCTION PANEL
ALT: 11 U/L (ref 0–44)
AST: 25 U/L (ref 15–41)
Albumin: 2.9 g/dL — ABNORMAL LOW (ref 3.5–5.0)
Alkaline Phosphatase: 49 U/L (ref 38–126)
Bilirubin, Direct: 0.2 mg/dL (ref 0.0–0.2)
Indirect Bilirubin: 0.1 mg/dL — ABNORMAL LOW (ref 0.3–0.9)
Total Bilirubin: 0.3 mg/dL (ref 0.3–1.2)
Total Protein: 6 g/dL — ABNORMAL LOW (ref 6.5–8.1)

## 2020-11-17 LAB — TROPONIN I (HIGH SENSITIVITY)
Troponin I (High Sensitivity): 16 ng/L (ref <18)
Troponin I (High Sensitivity): 18 ng/L — ABNORMAL HIGH (ref <18)
Troponin I (High Sensitivity): 18 ng/L — ABNORMAL HIGH (ref <18)

## 2020-11-17 MED ORDER — CARVEDILOL 12.5 MG PO TABS
12.5000 mg | ORAL_TABLET | Freq: Two times a day (BID) | ORAL | Status: DC
  Administered 2020-11-18 – 2020-11-19 (×2): 12.5 mg via ORAL
  Filled 2020-11-17 (×3): qty 1

## 2020-11-17 MED ORDER — LORAZEPAM 1 MG PO TABS
0.5000 mg | ORAL_TABLET | Freq: Once | ORAL | Status: AC
  Administered 2020-11-17: 0.5 mg via ORAL
  Filled 2020-11-17: qty 1

## 2020-11-17 MED ORDER — FENTANYL CITRATE PF 50 MCG/ML IJ SOSY
25.0000 ug | PREFILLED_SYRINGE | Freq: Once | INTRAMUSCULAR | Status: AC
Start: 2020-11-17 — End: 2020-11-17
  Administered 2020-11-17: 25 ug via INTRAVENOUS
  Filled 2020-11-17: qty 1

## 2020-11-17 MED ORDER — FUROSEMIDE 10 MG/ML IJ SOLN
40.0000 mg | Freq: Once | INTRAMUSCULAR | Status: AC
  Administered 2020-11-17: 40 mg via INTRAVENOUS
  Filled 2020-11-17: qty 4

## 2020-11-17 MED ORDER — TRAZODONE HCL 50 MG PO TABS
50.0000 mg | ORAL_TABLET | Freq: Every evening | ORAL | Status: DC | PRN
  Administered 2020-11-18 – 2020-11-20 (×3): 50 mg via ORAL
  Filled 2020-11-17 (×3): qty 1

## 2020-11-17 MED ORDER — DULOXETINE HCL 30 MG PO CPEP
30.0000 mg | ORAL_CAPSULE | Freq: Every day | ORAL | Status: DC
  Administered 2020-11-18 – 2020-11-25 (×8): 30 mg via ORAL
  Filled 2020-11-17 (×10): qty 1

## 2020-11-17 MED ORDER — ALBUTEROL SULFATE HFA 108 (90 BASE) MCG/ACT IN AERS
2.0000 | INHALATION_SPRAY | RESPIRATORY_TRACT | Status: DC | PRN
  Administered 2020-11-22: 2 via RESPIRATORY_TRACT
  Filled 2020-11-17: qty 6.7

## 2020-11-17 MED ORDER — LEVOTHYROXINE SODIUM 25 MCG PO TABS
25.0000 ug | ORAL_TABLET | Freq: Every day | ORAL | Status: DC
  Administered 2020-11-18 – 2020-11-25 (×8): 25 ug via ORAL
  Filled 2020-11-17 (×8): qty 1

## 2020-11-17 MED ORDER — INSULIN ASPART 100 UNIT/ML IJ SOLN
0.0000 [IU] | Freq: Three times a day (TID) | INTRAMUSCULAR | Status: DC
  Administered 2020-11-18 – 2020-11-19 (×4): 2 [IU] via SUBCUTANEOUS
  Administered 2020-11-19 – 2020-11-20 (×2): 3 [IU] via SUBCUTANEOUS
  Administered 2020-11-20: 2 [IU] via SUBCUTANEOUS
  Administered 2020-11-20: 3 [IU] via SUBCUTANEOUS
  Administered 2020-11-21: 2 [IU] via SUBCUTANEOUS
  Administered 2020-11-21 (×2): 3 [IU] via SUBCUTANEOUS
  Administered 2020-11-22 (×2): 2 [IU] via SUBCUTANEOUS
  Administered 2020-11-22: 3 [IU] via SUBCUTANEOUS
  Administered 2020-11-23: 2 [IU] via SUBCUTANEOUS

## 2020-11-17 MED ORDER — FENTANYL CITRATE PF 50 MCG/ML IJ SOSY
25.0000 ug | PREFILLED_SYRINGE | Freq: Once | INTRAMUSCULAR | Status: AC
  Administered 2020-11-17: 25 ug via INTRAVENOUS
  Filled 2020-11-17: qty 1

## 2020-11-17 MED ORDER — IRBESARTAN 300 MG PO TABS
300.0000 mg | ORAL_TABLET | Freq: Every day | ORAL | Status: DC
  Administered 2020-11-18 – 2020-11-20 (×3): 300 mg via ORAL
  Filled 2020-11-17 (×4): qty 1

## 2020-11-17 MED ORDER — RIVAROXABAN 10 MG PO TABS
10.0000 mg | ORAL_TABLET | Freq: Every day | ORAL | Status: DC
  Administered 2020-11-18 – 2020-11-25 (×7): 10 mg via ORAL
  Filled 2020-11-17 (×10): qty 1

## 2020-11-17 MED ORDER — MOMETASONE FURO-FORMOTEROL FUM 100-5 MCG/ACT IN AERO
2.0000 | INHALATION_SPRAY | Freq: Two times a day (BID) | RESPIRATORY_TRACT | Status: DC
  Administered 2020-11-17 – 2020-11-25 (×16): 2 via RESPIRATORY_TRACT
  Filled 2020-11-17: qty 8.8

## 2020-11-17 MED ORDER — ACETAMINOPHEN 325 MG PO TABS: 650.0000 mg | ORAL_TABLET | Freq: Four times a day (QID) | ORAL | Status: DC | PRN

## 2020-11-17 MED ORDER — FENOFIBRATE 160 MG PO TABS
160.0000 mg | ORAL_TABLET | Freq: Every day | ORAL | Status: DC
  Administered 2020-11-18 – 2020-11-25 (×7): 160 mg via ORAL
  Filled 2020-11-17 (×10): qty 1

## 2020-11-17 MED ORDER — ACETAMINOPHEN 650 MG RE SUPP: 650.0000 mg | Freq: Four times a day (QID) | RECTAL | Status: DC | PRN

## 2020-11-17 MED ORDER — HYDRALAZINE HCL 25 MG PO TABS
25.0000 mg | ORAL_TABLET | Freq: Three times a day (TID) | ORAL | Status: DC
  Administered 2020-11-17 – 2020-11-19 (×5): 25 mg via ORAL
  Filled 2020-11-17 (×7): qty 1

## 2020-11-17 MED ORDER — HYDRALAZINE HCL 25 MG PO TABS
25.0000 mg | ORAL_TABLET | Freq: Once | ORAL | Status: AC
  Administered 2020-11-17: 25 mg via ORAL
  Filled 2020-11-17: qty 1

## 2020-11-17 MED ORDER — CHLORTHALIDONE 25 MG PO TABS
25.0000 mg | ORAL_TABLET | Freq: Every day | ORAL | Status: DC
  Administered 2020-11-19: 25 mg via ORAL
  Filled 2020-11-17 (×4): qty 1

## 2020-11-17 MED ORDER — HYDRALAZINE HCL 20 MG/ML IJ SOLN
10.0000 mg | INTRAMUSCULAR | Status: DC | PRN
  Administered 2020-11-17: 10 mg via INTRAVENOUS
  Filled 2020-11-17: qty 1

## 2020-11-17 MED ORDER — ATORVASTATIN CALCIUM 10 MG PO TABS
20.0000 mg | ORAL_TABLET | Freq: Every day | ORAL | Status: DC
  Administered 2020-11-17 – 2020-11-25 (×9): 20 mg via ORAL
  Filled 2020-11-17 (×9): qty 2

## 2020-11-17 NOTE — H&P (Addendum)
History and Physical    Virginia Summers:[id number] DOB: 05/28/1940 DOA: 11/17/2020  PCP: Eather Colas, FNP  Patient coming from: Home.  Chief Complaint: Shortness of breath.  HPI: Virginia Summers is a 80 y.o. female with history of hypertension, PE, hypothyroidism, diabetes mellitus presents to the ER with complaint of shortness of breath.  Patient states she has been getting more short of breath over the last 3 to 4 days.  Shortness of breath worsens on exertion.  Has not been taking her medicines for last few weeks due to she ran out of it.  Patient also has been having some chest pressure at times.  Denies any productive cough fever or chills.  Patient also has been having right knee pain after she had abnormal movement when she twisted her leg about 3 weeks ago.  Patient was diagnosed with COVID infection about a month ago when patient had fever and upper respiratory symptoms for 10 days.  ED Course: In the ER patient was found to have blood pressure more than 200 systolic.  Patient was afebrile.  Chest x-ray was unremarkable.  Since patient's lower extremity was slightly swollen Dopplers were done which were negative for PE.  Labs show creatinine 1.05 hemoglobin 9.1 BNP of 258 high sensitive troponins were 16 and 18.  COVID test was positive.  Patient was given Lasix 40 mg IV and IV hydralazine.  Following which blood pressure improved and patient admitted for further observation.  Review of Systems: As per HPI, rest all negative.   Past Medical History:  Diagnosis Date   Anticoagulant long-term use    Chest pain    Diabetes mellitus    Diverticulitis    DVT (deep venous thrombosis) (HCC)    BILATERAL, March 2012   GERD (gastroesophageal reflux disease)    HTN (hypertension)    Hypothyroidism    Pulmonary embolism (HCC)     Past Surgical History:  Procedure Laterality Date   APPENDECTOMY  AGE 24   BACK SURGERY  2000   DISC   PARTIAL HYSTERECTOMY  AGE 37's APPROX.    SECONDARY INTRAOCULAR LENSE IMPLANTATION  2005,2006   OU   SHOULDER SURGERY  5-6 YRS OLD   PINS     reports that she quit smoking about 17 years ago. Her smoking use included cigarettes. She has a 15.00 pack-year smoking history. She has never used smokeless tobacco. She reports that she does not drink alcohol and does not use drugs.  Allergies  Allergen Reactions   Penicillins Swelling and Rash    Did it involve swelling of the face/tongue/throat, SOB, or low BP? No Did it involve sudden or severe rash/hives, skin peeling, or any reaction on the inside of your mouth or nose? No Did you need to seek medical attention at a hospital or doctor's office? No When did it last happen?    childhood   If all above answers are "NO", may proceed with cephalosporin use.   Sulfa Drugs Cross Reactors Swelling and Rash    Family History  Problem Relation Age of Onset   Transient ischemic attack Father    Coronary artery disease Father    Stroke Mother    Diabetes Mother    Diabetes Sister    Hypertension Daughter     Prior to Admission medications   Medication Sig Start Date End Date Taking? Authorizing Provider  albuterol (VENTOLIN HFA) 108 (90 Base) MCG/ACT inhaler Inhale 2 puffs into the lungs every 4 (four)  hours as needed. For shortness of breath.    [provider]  atorvastatin (LIPITOR) 20 MG tablet Take 20 mg by mouth daily. 05/29/19   [provider]  budesonide-formoterol (SYMBICORT) 80-4.5 MCG/ACT inhaler Inhale 2 puffs into the lungs 2 (two) times daily.    [provider]  carvedilol (COREG) 12.5 MG tablet Take 1 tablet (12.5 mg total) by mouth 2 (two) times daily with a meal. 04/13/20   Nahser, Deloris Ping, MD  chlorthalidone (HYGROTON) 25 MG tablet Take 1 tablet by mouth once daily 07/20/20   Nahser, Deloris Ping, MD  citalopram (CELEXA) 20 MG tablet Take 20 mg by mouth daily.  04/08/15   [provider]  DULoxetine (CYMBALTA) 30 MG capsule Take 30 mg by  mouth daily. 05/30/19   [provider]  fenofibrate (TRICOR) 145 MG tablet Take 1 tablet (145 mg total) by mouth daily. 04/20/20   Nahser, Deloris Ping, MD  fluticasone (FLONASE) 50 MCG/ACT nasal spray Place 2 sprays into both nostrils daily. 09/26/18   [provider]  hydrALAZINE (APRESOLINE) 25 MG tablet TAKE 1 TABLET BY MOUTH THREE TIMES DAILY 09/09/20   Nahser, Deloris Ping, MD  levothyroxine (SYNTHROID, LEVOTHROID) 25 MCG tablet TAKE 1 TABLET (25 MCG TOTAL) BY MOUTH DAILY. 03/08/13   Nahser, Deloris Ping, MD  metFORMIN (GLUCOPHAGE-XR) 500 MG 24 hr tablet Take 500 mg by mouth 2 (two) times daily before a meal.    [provider]  potassium chloride (KLOR-CON) 10 MEQ tablet Take 1 tablet by mouth once daily 05/11/20   Nahser, Deloris Ping, MD  predniSONE (DELTASONE) 1 MG tablet Take 2 tablets by mouth as directed. take 2 tablets by mouth once daily for 4 weeks, then 1 tablet once daily for 4 weeks,, then 1/2 tablet once for 4 weeks, then stop 05/29/19   [provider]  traZODone (DESYREL) 50 MG tablet Take 50 mg by mouth at bedtime as needed for sleep.  09/18/17   [provider]  valsartan (DIOVAN) 320 MG tablet Take 1 tablet by mouth once daily 05/11/20   Nahser, Deloris Ping, MD  XARELTO 10 MG TABS tablet Take 1 tablet by mouth once daily 06/22/20   Nahser, Deloris Ping, MD    Physical Exam: Constitutional: Moderately built and nourished. Vitals:   11/17/20 1945 11/17/20 2100 11/17/20 2153 11/17/20 2158  BP: (!) 177/65 (!) 180/68 (!) 175/64   Pulse: 78 72 72 73  Resp: 16 20 14 17   Temp:      TempSrc:      SpO2: 98% 100% 100% 100%   Eyes: Anicteric no pallor. ENMT: No discharge from the ears eyes nose and mouth. Neck: JVD elevated no mass felt. Respiratory: No rhonchi or crepitations. Cardiovascular: S1-S2 heard. Abdomen: Soft nontender bowel sound present. Musculoskeletal: Right knee pain on flexing. Skin: No rash. Neurologic: Alert awake oriented to time place and  person.  Moves all extremities. Psychiatric: Appears normal.  Normal affect.    Labs on Admission: I have personally reviewed following labs and imaging studies  CBC: Recent Labs  Lab 11/17/20 1553  WBC 9.0  NEUTROABS 5.6  HGB 11.1*  HCT 36.3  MCV 85.4  PLT 293   Basic Metabolic Panel: Recent Labs  Lab 11/17/20 1711  NA 138  K 4.8  CL 109  CO2 23  GLUCOSE 153*  BUN 24*  CREATININE 1.05*  CALCIUM 8.0*   GFR: CrCl cannot be calculated (Unknown ideal weight.). Liver Function Tests: Recent Labs  Lab  11/17/20 1635  AST 25  ALT 11  ALKPHOS 49  BILITOT 0.3  PROT 6.0*  ALBUMIN 2.9*   Recent Labs  Lab 11/17/20 1635  LIPASE 34   No results for input(s): AMMONIA in the last 168 hours. Coagulation Profile: No results for input(s): INR, PROTIME in the last 168 hours. Cardiac Enzymes: No results for input(s): CKTOTAL, CKMB, CKMBINDEX, TROPONINI in the last 168 hours. BNP (last 3 results) No results for input(s): PROBNP in the last 8760 hours. HbA1C: No results for input(s): HGBA1C in the last 72 hours. CBG: No results for input(s): GLUCAP in the last 168 hours. Lipid Profile: No results for input(s): CHOL, HDL, LDLCALC, TRIG, CHOLHDL, LDLDIRECT in the last 72 hours. Thyroid Function Tests: No results for input(s): TSH, T4TOTAL, FREET4, T3FREE, THYROIDAB in the last 72 hours. Anemia Panel: No results for input(s): VITAMINB12, FOLATE, FERRITIN, TIBC, IRON, RETICCTPCT in the last 72 hours. Urine analysis: No results found for: COLORURINE, APPEARANCEUR, LABSPEC, PHURINE, GLUCOSEU, HGBUR, BILIRUBINUR, KETONESUR, PROTEINUR, UROBILINOGEN, NITRITE, LEUKOCYTESUR Sepsis Labs: (procalcitonin:4,lacticidven:4) ) Recent Results (from the past 240 hour(s))  Resp Panel by RT-PCR (Flu A&B, Covid) Nasopharyngeal Swab     Status: Abnormal   Collection Time: 11/17/20  4:05 PM   Specimen: Nasopharyngeal Swab; Nasopharyngeal(NP) swabs in vial transport medium  Result  Value Ref Range Status   SARS Coronavirus 2 by RT PCR POSITIVE (A) NEGATIVE Final    Comment: RESULT CALLED TO, READ BACK BY AND VERIFIED WITH: [place] NICKERSON RN 11/17/20  BY JW (NOTE) SARS-CoV-2 target nucleic acids are DETECTED.  The SARS-CoV-2 RNA is generally detectable in upper respiratory specimens during the acute phase of infection. Positive results are indicative of the presence of the identified virus, but do not rule out bacterial infection or co-infection with other pathogens not detected by the test. Clinical correlation with patient history and other diagnostic information is necessary to determine patient infection status. The expected result is Negative.  Fact Sheet for Patients: BloggerCourse.com  Fact Sheet for Healthcare Providers: SeriousBroker.it  This test is not yet approved or cleared by the [place] FDA and  has been authorized for detection and/or diagnosis of SARS-CoV-2 by FDA under an Emergency Use Authorization (EUA).  This EUA will remain in effect (meaning this test c an be used) for the duration of  the COVID-19 declaration under Section 564(b)(1) of the Act, 21 U.S.C. section 360bbb-3(b)(1), unless the authorization is terminated or revoked sooner.     Influenza A by PCR NEGATIVE NEGATIVE Final   Influenza B by PCR NEGATIVE NEGATIVE Final    Comment: (NOTE) The Xpert Xpress SARS-CoV-2/FLU/RSV plus assay is intended as an aid in the diagnosis of influenza from Nasopharyngeal swab specimens and should not be used as a sole basis for treatment. Nasal washings and aspirates are unacceptable for Xpert Xpress SARS-CoV-2/FLU/RSV testing.  Fact Sheet for Patients: BloggerCourse.com  Fact Sheet for Healthcare Providers: SeriousBroker.it  This test is not yet approved or cleared by the [place] FDA and has been authorized for detection  and/or diagnosis of SARS-CoV-2 by FDA under an Emergency Use Authorization (EUA). This EUA will remain in effect (meaning this test can be used) for the duration of the COVID-19 declaration under Section 564(b)(1) of the Act, 21 U.S.C. section 360bbb-3(b)(1), unless the authorization is terminated or revoked.  Performed at [facility], [address] [place], [place] [address]      Radiological Exams on Admission: DG Chest [facility] 1 View  Result Date:  11/17/2020 CLINICAL DATA:  Shortness of breath EXAM: PORTABLE CHEST 1 VIEW COMPARISON:  Chest radiograph 05/13/2019 FINDINGS: The heart is mildly enlarged, unchanged. The mediastinal contours are stable. There is no focal consolidation or pulmonary edema. There is no pleural effusion or pneumothorax. There is no acute osseous abnormality. IMPRESSION: Stable cardiomegaly. Otherwise, no radiographic evidence of acute cardiopulmonary process. Electronically Signed   By: Lesia Hausen M.D.   On: 11/17/2020 16:31   VAS [place] LOWER EXTREMITY VENOUS (DVT) (ONLY [facility] & [facility] 7a-7p)  Result Date: 11/17/2020  Lower Venous DVT Study Patient Name:  Virginia Summers  Date of Exam:   11/17/2020 Medical Rec #: [id number]     Accession #:    [id number] Date of Birth: Aug 10, 1940    Patient Gender: F Patient Age:   53 years Exam Location:  [facility] Procedure:      VAS [place] LOWER EXTREMITY VENOUS (DVT) Referring Phys: JOSHUA LONG --------------------------------------------------------------------------------  Indications: Pain, and Edema.  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 Limitations: Body habitus and poor ultrasound/tissue interface. Comparison Study: Previous exam 05/26/2010 Positive for DVT RLE - PTV & LLE - PTV                   and PopV Performing Technologist: Jody Hill RVT, RDMS  Examination Guidelines: A complete evaluation includes B-mode imaging, spectral Doppler, color Doppler, and power Doppler as needed of all accessible portions of each  vessel. Bilateral testing is considered an integral part of a complete examination. Limited examinations for reoccurring indications may be performed as noted. The reflux portion of the exam is performed with the patient in reverse Trendelenburg.  +---------+---------------+---------+-----------+----------+-------------------+ RIGHT    CompressibilityPhasicitySpontaneityPropertiesThrombus Aging      +---------+---------------+---------+-----------+----------+-------------------+ CFV      Full           Yes      Yes                                      +---------+---------------+---------+-----------+----------+-------------------+ SFJ      Full                                                             +---------+---------------+---------+-----------+----------+-------------------+ FV Prox  Full           Yes      Yes                                      +---------+---------------+---------+-----------+----------+-------------------+ FV Mid   Full           Yes      Yes                                      +---------+---------------+---------+-----------+----------+-------------------+ FV DistalFull           Yes      Yes                                      +---------+---------------+---------+-----------+----------+-------------------+  PFV      Full                                                             +---------+---------------+---------+-----------+----------+-------------------+ POP      Full           Yes      Yes                                      +---------+---------------+---------+-----------+----------+-------------------+ PTV      Full                                         Not well visualized +---------+---------------+---------+-----------+----------+-------------------+ PERO     Full                                         Not well visualized +---------+---------------+---------+-----------+----------+-------------------+    +----+---------------+---------+-----------+----------+--------------+ LEFTCompressibilityPhasicitySpontaneityPropertiesThrombus Aging +----+---------------+---------+-----------+----------+--------------+ CFV Full           Yes      Yes                                 +----+---------------+---------+-----------+----------+--------------+     Summary: RIGHT: - There is no evidence of deep vein thrombosis in the lower extremity. - There is no evidence of superficial venous thrombosis.  - A cystic structure is found in the popliteal fossa. Subcutaneous edema in calf area.  LEFT: - No evidence of common femoral vein obstruction.  *See table(s) above for measurements and observations. Electronically signed by Coral Else MD on 11/17/2020 at 7:18:44 PM.    Final     EKG: Independently reviewed.  Normal sinus rhythm with LBBB.  Assessment/Plan Principal Problem:   Acute respiratory failure with hypoxemia (HCC) Active Problems:   Pulmonary embolism (HCC)   Hypertensive urgency   Diabetes mellitus type 2 in obese (HCC)   Acute respiratory failure with hypoxia (HCC)   COVID-19 virus infection    Acute respiratory failure with hypoxia presently on 2 L oxygen which is new for the patient likely from hypertensive urgency for which patient was given Lasix and IV hydralazine.  Her respiratory status is improving after Lasix and hydralazine. Hypertensive urgency likely from patient missing her medications.  I have restarted her home medications including Coreg hydralazine ARB hydrochlorothiazide and patient was given 1 dose of IV Lasix.  Closely follow blood pressure trends. Chest pressure likely from uncontrolled hypertension.  Will trend cardiac markers check 2D echo consult cardiology. COVID-19 infection positive patient was recently positive about 4 weeks ago.  Presently asymptomatic with regard to COVID.  I think patient's symptoms are likely from hypertension uncontrolled. Right knee pain  -patient has been having right knee pain for the last 3 weeks after she twisted her knee while walking.  X-ray of the right knee pending. History of prior PE and DVT on Xarelto which patient states she did not miss the dose.  Dopplers were negative for DVT. Diabetes  mellitus type 2 we will keep patient on sliding scale coverage. Chronic kidney disease stage III creatinine appears to be at baseline. Chronic anemia likely from kidney disease hemoglobin appears to be at baseline. Hypothyroidism on Synthroid. Hyperlipidemia on statins.   DVT prophylaxis: Xarelto. Code Status: Full code. Family Communication: Patient's daughter. Disposition Plan: Home. Consults called: Cardiology. Admission status: Observation.   Eduard ClosArshad N Mrk Buzby MD [facility] Pager [phone].  If 7PM-7AM, please contact night-coverage [email or web address] Password [email or web address]  11/17/2020, 10:00 PM

## 2020-11-17 NOTE — ED Notes (Signed)
MD at bedside assessing pt. Daughter at bedside.

## 2020-11-17 NOTE — ED Provider Notes (Signed)
Emergency Department Provider Note   I have reviewed the triage vital signs and the nursing notes.   HISTORY  Chief Complaint Shortness of Breath and Chest Pain   HPI Virginia Summers is a 80 y.o. female with past medical history reviewed below including prior PE on Xarelto, diabetes, DVT, and HTN presents to the emergency department with 3 days of worsening shortness of breath with chest tightness.  Symptoms have been progressively worsening over that time.  She denies any fevers or chills.  She has a central chest tightness without radiation.  No ripping/tearing pain.  She been using her inhalers thinking she might be getting COPD type symptoms but those were not working.  She has been strictly compliant with her home medicines including blood pressure medication and Xarelto.  She has had several weeks of right leg swelling and pain which she attributes mainly to knee discomfort but has noticed some swelling in that location as well.  She ultimately called EMS after she developed worsening symptoms along with near syncope at home.  EMS arrived to find the patient with oxygen saturation in the 80s and started on nonrebreather were able to bring down to 6 L by facemask.  The patient does not typically wear oxygen at home.  She did have COVID 1 month ago but made a recovery without hospitalization.  She denies any abdominal or back pain.    Past Medical History:  Diagnosis Date   Anticoagulant Arianie Couse-term use    Chest pain    Diabetes mellitus    Diverticulitis    DVT (deep venous thrombosis) (HCC)    BILATERAL, March 2012   GERD (gastroesophageal reflux disease)    HTN (hypertension)    Hypothyroidism    Pulmonary embolism [facility])     Patient Active Problem List   Diagnosis Date Noted   Acute respiratory failure with hypoxemia (HCC) 11/17/2020   Hypertensive urgency 11/17/2020   Diabetes mellitus type 2 in obese (HCC) 11/17/2020   Acute respiratory failure with hypoxia (HCC) 11/17/2020    COVID-19 virus infection 11/17/2020   Precordial chest pain    Palpitations 04/17/2020   Morbid obesity (HCC) 06/04/2019   COPD with acute exacerbation (HCC) 05/10/2019   History of pulmonary embolus (PE) 05/10/2019   Hiatal hernia 05/12/2011   Hypothyroidism 08/10/2010   Cramp 06/09/2010   Pulmonary HTN (HCC) 06/09/2010   Pulmonary embolism (HCC)    HTN (hypertension)    Anticoagulant Bernd Crom-term use     Past Surgical History:  Procedure Laterality Date   APPENDECTOMY  AGE 89   BACK SURGERY  2000   DISC   PARTIAL HYSTERECTOMY  AGE 63's APPROX.   SECONDARY INTRAOCULAR LENSE IMPLANTATION  2005,2006   OU   SHOULDER SURGERY  5-6 YRS OLD   PINS    Allergies Penicillins and Sulfa drugs cross reactors  Family History  Problem Relation Age of Onset   Transient ischemic attack Father    Coronary artery disease Father    Stroke Mother    Diabetes Mother    Diabetes Sister    Hypertension Daughter     Social History Social History   Tobacco Use   Smoking status: Former    Packs/day: 0.50    Years: 30.00    Pack years: 15.00    Types: Cigarettes    Quit date: 03/15/2003    Years since quitting: 17.6   Smokeless tobacco: Never  Substance Use Topics   Alcohol use: No   Drug use:  No    Review of Systems  Constitutional: No fever/chills Eyes: No visual changes. ENT: No sore throat. Cardiovascular: Positive chest pain. Positive near syncope.  Respiratory: Positive shortness of breath. Gastrointestinal: No abdominal pain.  No nausea, no vomiting.  No diarrhea.  No constipation. Genitourinary: Negative for dysuria. Musculoskeletal: Negative for back pain. Skin: Negative for rash. Neurological: Negative for headaches, focal weakness or numbness.  10-point ROS otherwise negative.  ____________________________________________   PHYSICAL EXAM:  VITAL SIGNS: ED Triage Vitals  Enc Vitals Group     BP 11/17/20 1515 (!) 222/78     Pulse Rate 11/17/20 1515 (!) 57      Resp 11/17/20 1515 (!) 21     Temp --      Temp src --      SpO2 11/17/20 1511 98 %   Constitutional: Alert and oriented. Well appearing and in no acute distress. Eyes: Conjunctivae are normal.  Head: Atraumatic. Nose: No congestion/rhinnorhea. Mouth/Throat: Mucous membranes are moist.   Neck: No stridor.   Cardiovascular: Normal rate, regular rhythm. Good peripheral circulation. Grossly normal heart sounds.   Respiratory: Increased respiratory effort.  No retractions. Lungs w/o appreciable wheezing. Diminished at the bases with some crackles bilaterally.  Gastrointestinal: Soft and nontender. No distention.  Musculoskeletal: No lower extremity tenderness. Asymmetric right leg pain/swelling. No cellulitis. No gross deformities of extremities. Neurologic:  Normal speech and language. No gross focal neurologic deficits are appreciated.  Skin:  Skin is warm, dry and intact. No rash noted.   ____________________________________________   LABS (all labs ordered are listed, but only abnormal results are displayed)  Labs Reviewed  RESP PANEL BY RT-PCR (FLU A&B, COVID) ARPGX2 - Abnormal; Notable for the following components:      Result Value   SARS Coronavirus 2 by RT PCR POSITIVE (*)    All other components within normal limits  CBC WITH DIFFERENTIAL/PLATELET - Abnormal; Notable for the following components:   Hemoglobin 11.1 (*)    All other components within normal limits  BRAIN NATRIURETIC PEPTIDE - Abnormal; Notable for the following components:   B Natriuretic Peptide 258.7 (*)    All other components within normal limits  HEPATIC FUNCTION PANEL - Abnormal; Notable for the following components:   Total Protein 6.0 (*)    Albumin 2.9 (*)    Indirect Bilirubin 0.1 (*)    All other components within normal limits  BASIC METABOLIC PANEL - Abnormal; Notable for the following components:   Glucose, Bld 153 (*)    BUN 24 (*)    Creatinine, Ser 1.05 (*)    Calcium 8.0 (*)     GFR, Estimated 54 (*)    All other components within normal limits  CBG MONITORING, ED - Abnormal; Notable for the following components:   Glucose-Capillary 159 (*)    All other components within normal limits  TROPONIN I (HIGH SENSITIVITY) - Abnormal; Notable for the following components:   Troponin I (High Sensitivity) 18 (*)    All other components within normal limits  LIPASE, BLOOD  TROPONIN I (HIGH SENSITIVITY)  TROPONIN I (HIGH SENSITIVITY)   ____________________________________________  EKG   EKG Interpretation  Date/Time:  Tuesday November 17 2020 15:52:46 EDT Ventricular Rate:  54 PR Interval:  109 QRS Duration: 124 QT Interval:  458 QTC Calculation: 434 R Axis:   -27 Text Interpretation: Sinus or ectopic atrial rhythm Atrial premature complex Short PR interval Left bundle branch block since last tracing no significant change Confirmed by Mancel Bale (  [id number]) on 11/17/2020 3:54:29 PM        ____________________________________________  RADIOLOGY  DG Chest Port 1 View  Result Date: 11/17/2020 CLINICAL DATA:  Shortness of breath EXAM: PORTABLE CHEST 1 VIEW COMPARISON:  Chest radiograph 05/13/2019 FINDINGS: The heart is mildly enlarged, unchanged. The mediastinal contours are stable. There is no focal consolidation or pulmonary edema. There is no pleural effusion or pneumothorax. There is no acute osseous abnormality. IMPRESSION: Stable cardiomegaly. Otherwise, no radiographic evidence of acute cardiopulmonary process. Electronically Signed   By: Lesia HausenPeter  Noone M.D.   On: 11/17/2020 16:31   DG Knee Complete 4 Views Right  Result Date: 11/17/2020 CLINICAL DATA:  Knee pain EXAM: RIGHT KNEE - COMPLETE 4+ VIEW COMPARISON:  None. FINDINGS: Small suprapatellar joint effusion. No acute fracture or dislocation identified. There is sharpening of the tibial spines. Patellofemoral and medial compartment joint space narrowing is identified. IMPRESSION: 1. Small suprapatellar joint  effusion. 2. Patellofemoral and medial compartment osteoarthritis. Electronically Signed   By: Signa Kellaylor  Stroud M.D.   On: 11/17/2020 17:54   VAS US LOWER EXTREMITY VENOUS (DVT) (ONLY [facility] & [facility] 7a-7p)  Result Date: 11/17/2020  Lower Venous DVT Study Patient Name:  Virginia Summers  Date of Exam:   11/17/2020 Medical Rec #: [id number]     Accession #:    [id number] Date of Birth: 1940/08/13    Patient Gender: F Patient Age:   [age] years [facility] Procedure:      VAS US LOWER EXTREMITY VENOUS (DVT) Referring Phys: Tawonda Legaspi --------------------------------------------------------------------------------  Indications: Pain, and Edema.  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 Limitations: Body habitus and poor ultrasound/tissue interface. Comparison Study: Previous exam 05/26/2010 Positive for DVT RLE - PTV & LLE - PTV                   and PopV Performing Technologist: Jody Hill RVT, RDMS  Examination Guidelines: A complete evaluation includes B-mode imaging, spectral Doppler, color Doppler, and power Doppler as needed of all accessible portions of each vessel. Bilateral testing is considered an integral part of a complete examination. Limited examinations for reoccurring indications may be performed as noted. The reflux portion of the exam is performed with the patient in reverse Trendelenburg.  +---------+---------------+---------+-----------+----------+-------------------+ RIGHT    CompressibilityPhasicitySpontaneityPropertiesThrombus Aging      +---------+---------------+---------+-----------+----------+-------------------+ CFV      Full           Yes      Yes                                      +---------+---------------+---------+-----------+----------+-------------------+ SFJ      Full                                                             +---------+---------------+---------+-----------+----------+-------------------+ FV Prox  Full           Yes       Yes                                      +---------+---------------+---------+-----------+----------+-------------------+ FV Mid  Full           Yes      Yes                                      +---------+---------------+---------+-----------+----------+-------------------+ FV DistalFull           Yes      Yes                                      +---------+---------------+---------+-----------+----------+-------------------+ PFV      Full                                                             +---------+---------------+---------+-----------+----------+-------------------+ POP      Full           Yes      Yes                                      +---------+---------------+---------+-----------+----------+-------------------+ PTV      Full                                         Not well visualized +---------+---------------+---------+-----------+----------+-------------------+ PERO     Full                                         Not well visualized +---------+---------------+---------+-----------+----------+-------------------+   +----+---------------+---------+-----------+----------+--------------+ LEFTCompressibilityPhasicitySpontaneityPropertiesThrombus Aging +----+---------------+---------+-----------+----------+--------------+ CFV Full           Yes      Yes                                 +----+---------------+---------+-----------+----------+--------------+     Summary: RIGHT: - There is no evidence of deep vein thrombosis in the lower extremity. - There is no evidence of superficial venous thrombosis.  - A cystic structure is found in the popliteal fossa. Subcutaneous edema in calf area.  LEFT: - No evidence of common femoral vein obstruction.  *See table(s) above for measurements and observations. Electronically signed by Coral Else MD on 11/17/2020 at 7:18:44 PM.    Final      ____________________________________________   PROCEDURES  Procedure(s) performed:   Procedures  CRITICAL CARE Performed by: Maia Plan Total critical care time: 35 minutes Critical care time was exclusive of separately billable procedures and treating other patients. Critical care was necessary to treat or prevent imminent or life-threatening deterioration. Critical care was time spent personally by me on the following activities: development of treatment plan with patient and/or surrogate as well as nursing, discussions with consultants, evaluation of patient's response to treatment, examination of patient, obtaining history from patient or surrogate, ordering and performing treatments and interventions, ordering and review of laboratory studies, ordering and review of radiographic studies, pulse oximetry and re-evaluation of patient's condition.  Alona Bene, MD Emergency Medicine  ____________________________________________   INITIAL IMPRESSION / ASSESSMENT AND PLAN / ED COURSE  Pertinent labs & imaging results that were available during my care of the patient were reviewed by me and considered in my medical decision making (see chart for details).   Patient presents to the emergency department with chest tightness and shortness of breath. More gradual worsening symptoms over days rather than abrupt onset symptoms. No fever. Notes recent COVID.   Differential includes all life-threatening causes for chest pain. This includes but is not exclusive to acute coronary syndrome, aortic dissection, pulmonary embolism, cardiac tamponade, community-acquired pneumonia, pericarditis, musculoskeletal chest wall pain, etc.  Patient on Xarelto and compliant per patient and daughter at bedside. DVT is lower probability in that case but will perform RLE [place] with unilateral swelling. PE similarly lower on my differential. Patient did self report some building anxiety since arriving in the  ED. Will given PO ativan and have added some hydralazine PO with very high BP here. HTN urgency/emergency also a consideration.   Labs resulting with mildly elevated BNP. COVID positive but likely residual from her diagnosis 1 month prior. CXR without infiltrate. Suspect HTN urgency with mild fluid overload. Not consistent with COPD exasperation. Will admit for BP mgmt and diuresis. Hold on PE workup for now with patient clinically improving with BP reduction and lasix. She is strictly compliant with her Xarelto.    Discussed patient's case with [facility] to request admission. Patient and family (if present) updated with plan. Care transferred to [facility]  service.  I reviewed all nursing notes, vitals, pertinent old records, EKGs, labs, imaging (as available).  ____________________________________________  FINAL CLINICAL IMPRESSION(S) / ED DIAGNOSES  Final diagnoses:  Acute respiratory failure with hypoxia (HCC)  Elevated blood pressure reading  Precordial chest pain     MEDICATIONS GIVEN DURING THIS VISIT:  Medications  atorvastatin (LIPITOR) tablet 20 mg (20 mg Oral Given 11/17/20 2234)  carvedilol (COREG) tablet 12.5 mg (has no administration in time range)  chlorthalidone (HYGROTON) tablet 25 mg (has no administration in time range)  fenofibrate tablet 160 mg (has no administration in time range)  hydrALAZINE (APRESOLINE) tablet 25 mg (25 mg Oral Given 11/17/20 2234)  irbesartan (AVAPRO) tablet 300 mg (has no administration in time range)  DULoxetine (CYMBALTA) DR capsule 30 mg (has no administration in time range)  traZODone (DESYREL) tablet 50 mg (has no administration in time range)  levothyroxine (SYNTHROID) tablet 25 mcg (has no administration in time range)  rivaroxaban (XARELTO) tablet 10 mg (has no administration in time range)  albuterol (VENTOLIN HFA) 108 (90 Base) MCG/ACT inhaler 2 puff (has no administration in time range)  mometasone-formoterol (DULERA) 100-5 MCG/ACT inhaler 2  puff (has no administration in time range)  insulin aspart (novoLOG) injection 0-9 Units (has no administration in time range)  acetaminophen (TYLENOL) tablet 650 mg (has no administration in time range)    Or  acetaminophen (TYLENOL) suppository 650 mg (has no administration in time range)  hydrALAZINE (APRESOLINE) injection 10 mg (has no administration in time range)  LORazepam (ATIVAN) tablet 0.5 mg (0.5 mg Oral Given 11/17/20 1657)  hydrALAZINE (APRESOLINE) tablet 25 mg (25 mg Oral Given 11/17/20 1657)  fentaNYL (SUBLIMAZE) injection 25 mcg (25 mcg Intravenous Given 11/17/20 1855)  furosemide (LASIX) injection 40 mg (40 mg Intravenous Given 11/17/20 2030)  fentaNYL (SUBLIMAZE) injection 25 mcg (25 mcg Intravenous Given 11/17/20 2132)     Note:  This document was prepared using Dragon voice recognition  software and may include unintentional dictation errors.  Alona Bene, MD, [facility] Emergency Medicine    Nathaneil Feagans, Arlyss Repress, MD 11/17/20 2325

## 2020-11-17 NOTE — ED Notes (Signed)
Pt given snacks and ginger-ale  

## 2020-11-17 NOTE — Telephone Encounter (Signed)
Pt c/o of Chest Pain: STAT if CP now or developed within 24 hours  1. Are you having CP right now? yes  2. Are you experiencing any other symptoms (ex. SOB, nausea, vomiting, sweating)? Sob dizziness, nausea  3. How long have you been experiencing CP? 3 days  4. Is your CP continuous or coming and going? Coming and going   5. Have you taken Nitroglycerin? No   STAT if HR is under 50 or over 120 (normal HR is 60-100 beats per minute)  What is your heart rate? In the 40s  Do you have a log of your heart rate readings (document readings)?   Do you have any other symptoms? Tiredness, has the shakes, feel like she having a panic attack    ?

## 2020-11-17 NOTE — ED Notes (Signed)
US at bedside

## 2020-11-17 NOTE — ED Notes (Signed)
Portable Xray at bedside.

## 2020-11-17 NOTE — Telephone Encounter (Signed)
Call sent straight to triage. Patient complained of chest pressure, SOB, and anxiety. Patient stated that she has been having SOB and anxiety for a while. Patient stated she has stopped her anxiety medications about 4 months ago. Patient wants to know if this is the reason she is having SOB and chest discomfort. With patient's history of PE and DVT, encouraged patient to go to ED. Patient stated she feels faint too. Informed patient that she needs to have someone drive her to the ED. Patient stated she does not have any one to drive her right now. Informed patient that she could call 911 if someone cannot take her to the ED. Informed patient that with her history and symptoms she does not need to wait for an office visit and needs to be evaluated right away. Patient agreed to plan.

## 2020-11-17 NOTE — ED Notes (Signed)
Dr. Kakrakandy at bedside. 

## 2020-11-17 NOTE — Progress Notes (Signed)
RLE venous duplex has been completed.  Preliminary results given to Dr. Jacqulyn Bath.   Results can be found under chart review under CV PROC. 11/17/2020 6:07 PM Liana Camerer RVT, RDMS

## 2020-11-17 NOTE — ED Provider Notes (Signed)
MSE was initiated and I personally evaluated the patient and placed orders (if any) at  3:51 PM on November 17, 2020.  Presents by EMS for evaluation of shortness of breath, hypoxia, tachycardia, hypertension.  She was treated with nebulizers, Solu-Medrol, nitroglycerin and aspirin.  During transport she complained of chest pain.  3:48 PM-at this time she is alert, on oxygen by facemask at 100% saturation.  She is complaining of cramps in her legs bilaterally.  There is no increased work of breathing.  Auscultated anteriorly, there is no wheeze.  Chest nontender to palpation.  Abdomen is distended but nontender.  The patient appears stable so that the remainder of the MSE may be completed by another provider.  Suspect COPD exacerbation   Mancel Bale, MD 11/17/20 1556

## 2020-11-17 NOTE — ED Triage Notes (Signed)
BIB [facility] pt c/o of increase SHOB and C/P over the last 3 days. Per EMS, pt sat n 80's on RA on arrival. Pt placed on 6L. Not on 02 at baseline. Given 324 Aspirin and 3 Nitrol SL enroute to hospital .   HX: HTN, blood clots. COPD, asthma.

## 2020-11-18 ENCOUNTER — Observation Stay (HOSPITAL_COMMUNITY): Payer: Medicare HMO

## 2020-11-18 DIAGNOSIS — J9601 Acute respiratory failure with hypoxia: Secondary | ICD-10-CM | POA: Diagnosis not present

## 2020-11-18 DIAGNOSIS — R072 Precordial pain: Secondary | ICD-10-CM | POA: Diagnosis not present

## 2020-11-18 DIAGNOSIS — R079 Chest pain, unspecified: Secondary | ICD-10-CM | POA: Diagnosis not present

## 2020-11-18 DIAGNOSIS — I160 Hypertensive urgency: Secondary | ICD-10-CM | POA: Diagnosis not present

## 2020-11-18 LAB — LIPID PANEL
Cholesterol: 231 mg/dL — ABNORMAL HIGH (ref 0–200)
HDL: 35 mg/dL — ABNORMAL LOW (ref >40)
LDL Cholesterol: 153 mg/dL — ABNORMAL HIGH (ref 0–99)
Total CHOL/HDL Ratio: 6.6 RATIO
Triglycerides: 214 mg/dL — ABNORMAL HIGH (ref <150)
VLDL: 43 mg/dL — ABNORMAL HIGH (ref 0–40)

## 2020-11-18 LAB — ECHOCARDIOGRAM COMPLETE
AR max vel: 1.66 cm2
AV Area VTI: 1.73 cm2
AV Area mean vel: 1.51 cm2
AV Mean grad: 8 mmHg
AV Peak grad: 11.7 mmHg
Ao pk vel: 1.71 m/s
Area-P 1/2: 3.33 cm2
S' Lateral: 2.8 cm
Single Plane A4C EF: 61.8 %

## 2020-11-18 LAB — CBG MONITORING, ED: Glucose-Capillary: 159 mg/dL — ABNORMAL HIGH (ref 70–99)

## 2020-11-18 LAB — URINALYSIS, ROUTINE W REFLEX MICROSCOPIC
Bilirubin Urine: NEGATIVE
Glucose, UA: NEGATIVE mg/dL
Hgb urine dipstick: NEGATIVE
Ketones, ur: NEGATIVE mg/dL
Nitrite: NEGATIVE
Protein, ur: 30 mg/dL — AB
Specific Gravity, Urine: 1.039 — ABNORMAL HIGH (ref 1.005–1.030)
WBC, UA: >50 WBC/hpf — ABNORMAL HIGH (ref 0–5)
pH: 6 (ref 5.0–8.0)

## 2020-11-18 LAB — GLUCOSE, CAPILLARY
Glucose-Capillary: 168 mg/dL — ABNORMAL HIGH (ref 70–99)
Glucose-Capillary: 193 mg/dL — ABNORMAL HIGH (ref 70–99)
Glucose-Capillary: 207 mg/dL — ABNORMAL HIGH (ref 70–99)

## 2020-11-18 LAB — BASIC METABOLIC PANEL
Anion gap: 10 (ref 5–15)
BUN: 22 mg/dL (ref 8–23)
CO2: 28 mmol/L (ref 22–32)
Calcium: 9.3 mg/dL (ref 8.9–10.3)
Chloride: 99 mmol/L (ref 98–111)
Creatinine, Ser: 1.03 mg/dL — ABNORMAL HIGH (ref 0.44–1.00)
GFR, Estimated: 55 mL/min — ABNORMAL LOW (ref >60)
Glucose, Bld: 172 mg/dL — ABNORMAL HIGH (ref 70–99)
Potassium: 4 mmol/L (ref 3.5–5.1)
Sodium: 137 mmol/L (ref 135–145)

## 2020-11-18 LAB — HEMOGLOBIN A1C
Hgb A1c MFr Bld: 8.7 % — ABNORMAL HIGH (ref 4.8–5.6)
Mean Plasma Glucose: 202.99 mg/dL

## 2020-11-18 LAB — MAGNESIUM: Magnesium: 1.5 mg/dL — ABNORMAL LOW (ref 1.7–2.4)

## 2020-11-18 LAB — TROPONIN I (HIGH SENSITIVITY): Troponin I (High Sensitivity): 17 ng/L (ref <18)

## 2020-11-18 MED ORDER — TRAMADOL HCL 50 MG PO TABS
50.0000 mg | ORAL_TABLET | Freq: Four times a day (QID) | ORAL | Status: DC | PRN
Start: 2020-11-18 — End: 2020-11-26
  Administered 2020-11-18 – 2020-11-20 (×7): 50 mg via ORAL
  Filled 2020-11-18 (×8): qty 1

## 2020-11-18 MED ORDER — MAGNESIUM OXIDE -MG SUPPLEMENT 400 (240 MG) MG PO TABS
400.0000 mg | ORAL_TABLET | Freq: Two times a day (BID) | ORAL | Status: AC
  Administered 2020-11-18 – 2020-11-20 (×6): 400 mg via ORAL
  Filled 2020-11-18 (×6): qty 1

## 2020-11-18 MED ORDER — DOXYCYCLINE HYCLATE 100 MG PO TBEC: 100.0000 mg | DELAYED_RELEASE_TABLET | Freq: Two times a day (BID) | ORAL | 0 refills | Status: DC

## 2020-11-18 MED ORDER — OXYCODONE HCL 5 MG PO TABS
5.0000 mg | ORAL_TABLET | Freq: Once | ORAL | Status: AC
  Administered 2020-11-18: 5 mg via ORAL
  Filled 2020-11-18: qty 1

## 2020-11-18 MED ORDER — CIPROFLOXACIN HCL 500 MG PO TABS
500.0000 mg | ORAL_TABLET | Freq: Two times a day (BID) | ORAL | Status: DC
  Administered 2020-11-18 – 2020-11-20 (×5): 500 mg via ORAL
  Filled 2020-11-18 (×6): qty 1

## 2020-11-18 MED ORDER — IOHEXOL 350 MG/ML SOLN
64.0000 mL | Freq: Once | INTRAVENOUS | Status: AC | PRN
  Administered 2020-11-18: 64 mL via INTRAVENOUS

## 2020-11-18 MED ORDER — MELATONIN 3 MG PO TABS
3.0000 mg | ORAL_TABLET | Freq: Every day | ORAL | Status: DC
  Administered 2020-11-18 – 2020-11-25 (×8): 3 mg via ORAL
  Filled 2020-11-18 (×8): qty 1

## 2020-11-18 NOTE — Progress Notes (Signed)
PROGRESS NOTE    Virginia Summers  WUJ:[id number] DOB: 04-02-1940 DOA: 11/17/2020 PCP: [name], Megan A, FNP   Chief Complaint  Patient presents with   Shortness of Breath   Chest Pain    Brief Narrative:  80 year old female with history of hypertension, PE, hypothyroidism, diabetes presented with shortness of breath over the last 3 days 4 days, worse with exertion, has not been taking her medication for last 2 weeks due to running out of them.  Also complaining of right knee pain after she had abnormal movement when she twisted her leg about 3 weeks ago.  Recently diagnosed with COVID a month ago when she had a fever and upper respiratory tract symptoms for 10 days.  In the ED, found to have blood pressure more than 200 systolic.  Patient was afebrile.  Chest x-ray was unremarkable.  Since patient's lower extremity was slightly swollen Dopplers were done which were negative for PE.  Labs show creatinine 1.05 hemoglobin 9.1 BNP of 258 high sensitive troponins were 16 and 18.  COVID test was positive.  Patient was given Lasix 40 mg IV and IV hydralazine.  Following which blood pressure improved and patient admitted for further observation Cardiology was consulted.Serial troponin 16>18>18>17.  Blood pressure stabilizing resume home meds.  Still on oxygen and weaning down.   Subjective: Seen and examined this morning in the ED not feeling whole lot better but no chest pain. Overnight blood pressure has stabilized, on nasal cannula oxygen Serial troponin 16>18>18>17  Assessment & Plan:  Acute respiratory failure with hypoxemia  Hypertensive urgency Chest pain Work-up with mildly positive troponin no delta, bnp mildly up at 258 from 216. chest x-ray with stable cardiomegaly.  Getting echocardiogram EF no wall motion abnormalities cardiology planning for outpatient nuclear stress test.  Patient describes the pain similar to her previous episode of PE, getting CT angio chest.  Continue to wean down  oxygen.  Continue hypertension management.  CT images reviewed no acute PE, possible groundglass changes inflammation versus infection, add doxycycline.  Hypertension urgency: BP stabilized, on multiple home meds including Coreg, hydralazine, ARB, HCTZ.  Status post IV Lasix and daily  Chest pressure likely from uncontrolled hypertension 2D echo pending.roponin flat minimally elevated.  If no wall motion abnormalities on echocardiogram no plan to pursue stress testing as inpatient will be done as outpatient by cardiology  Right knee pain-x-ray showed osteoarthritis.    History of DVT/PE Dopplers negative for DVT.  Cont her xarelto  Chronic anemia likely from chronic kidney disease  CKD stage IIIa-stable Recent Labs    04/17/20 1503 11/17/20 1711  BUN 20 24*  CREATININE 1.19* 1.05*    HLD on statin Hypothyroidism on Synthroid Diabetes mellitus type 2 in obese: Stable on home meds. Recent Labs  Lab 11/17/20 2235 11/18/20 0926  GLUCAP 159* 159*    Recent COVID-19 virus infection a month ago.  No active infection- no need for isolation.  Thyroid nodules: Ultrasound thyroid.  Diet Order             Diet heart healthy/carb modified Room service appropriate? Yes; Fluid consistency: Thin; Fluid restriction: 1200 mL Fluid  Diet effective now                    Patient's There is no height or weight on file to calculate BMI.  DVT prophylaxis: rivaroxaban (XARELTO) tablet 10 mg Start: 11/18/20 1000 Code Status:   Code Status: Full Code  Family Communication: plan of care  discussed with patient at bedside. Status is: Observation Remains hospitalized for ongoing management Dispo: The patient is from: Home              Anticipated d/c is to: Home              Patient currently is not medically stable to d/c.   Difficult to place patient No  Unresulted Labs (From admission, onward)    None      Medications reviewed:  Scheduled Meds:  atorvastatin  20 mg Oral Daily    carvedilol  12.5 mg Oral BID WC   chlorthalidone  25 mg Oral Daily   DULoxetine  30 mg Oral Daily   fenofibrate  160 mg Oral Daily   hydrALAZINE  25 mg Oral TID   insulin aspart  0-9 Units Subcutaneous TID WC   irbesartan  300 mg Oral Daily   levothyroxine  25 mcg Oral Q0600   mometasone-formoterol  2 puff Inhalation BID   rivaroxaban  10 mg Oral Daily   Continuous Infusions: Consultants:see note  Procedures:see note Antimicrobials: Anti-infectives (From admission, onward)    None      Culture/Microbiology No results found for: SDES, SPECREQUEST, CULT, REPTSTATUS  Other culture-see note  Objective: Vitals: Today's Vitals   11/18/20 0615 11/18/20 0630 11/18/20 0645 11/18/20 0700  BP: (!) 129/52 (!) 143/50 (!) 130/50 (!) 121/35  Pulse: 72 75 70 72  Resp: 13 (!) 22 14 15   Temp:      TempSrc:      SpO2: 99% 99% 100% 97%  PainSc:        Intake/Output Summary (Last 24 hours) at 11/18/2020 0735 Last data filed at 11/17/2020 2309 Gross per 24 hour  Intake --  Output 1000 ml  Net -1000 ml   There were no vitals filed for this visit. Weight change:   Intake/Output from previous day: 09/06 0701 - 09/07 0700 In: -  Out: 1000 [Urine:1000] Intake/Output this shift: No intake/output data recorded. There were no vitals filed for this visit.  Examination: General exam: AAOx3, pleasant older than stated age, weak appearing. HEENT:Oral mucosa moist, Ear/Nose WNL grossly,dentition normal. Respiratory system: bilaterally diminished, no use of accessory muscle, non tender. Cardiovascular system: S1 & S2 +,No JVD. Gastrointestinal system: Abdomen soft, NT,ND, BS+. Nervous System:Alert, awake, moving extremities Extremities: no edema, distal peripheral pulses palpable.  Skin: No rashes,no icterus. MSK: Normal muscle bulk,tone, power  Data Reviewed: I have personally reviewed following labs and imaging studies CBC: Recent Labs  Lab 11/17/20 1553  WBC 9.0  NEUTROABS 5.6   HGB 11.1*  HCT 36.3  MCV 85.4  PLT 293   Basic Metabolic Panel: Recent Labs  Lab 11/17/20 1711  NA 138  K 4.8  CL 109  CO2 23  GLUCOSE 153*  BUN 24*  CREATININE 1.05*  CALCIUM 8.0*   GFR: CrCl cannot be calculated (Unknown ideal weight.). Liver Function Tests: Recent Labs  Lab 11/17/20 1635  AST 25  ALT 11  ALKPHOS 49  BILITOT 0.3  PROT 6.0*  ALBUMIN 2.9*   Recent Labs  Lab 11/17/20 1635  LIPASE 34   No results for input(s): AMMONIA in the last 168 hours. Coagulation Profile: No results for input(s): INR, PROTIME in the last 168 hours. Cardiac Enzymes: No results for input(s): CKTOTAL, CKMB, CKMBINDEX, TROPONINI in the last 168 hours. BNP (last 3 results) No results for input(s): PROBNP in the last 8760 hours. HbA1C: No results for input(s): HGBA1C in the last 72  hours. CBG: Recent Labs  Lab 11/17/20 2235  GLUCAP 159*   Lipid Profile: No results for input(s): CHOL, HDL, LDLCALC, TRIG, CHOLHDL, LDLDIRECT in the last 72 hours. Thyroid Function Tests: No results for input(s): TSH, T4TOTAL, FREET4, T3FREE, THYROIDAB in the last 72 hours. Anemia Panel: No results for input(s): VITAMINB12, FOLATE, FERRITIN, TIBC, IRON, RETICCTPCT in the last 72 hours. Sepsis Labs: No results for input(s): PROCALCITON, LATICACIDVEN in the last 168 hours.  Recent Results (from the past 240 hour(s))  Resp Panel by RT-PCR (Flu A&B, Covid) Nasopharyngeal Swab     Status: Abnormal   Collection Time: 11/17/20  4:05 PM   Specimen: Nasopharyngeal Swab; Nasopharyngeal(NP) swabs in vial transport medium  Result Value Ref Range Status   SARS Coronavirus 2 by RT PCR POSITIVE (A) NEGATIVE Final    Comment: RESULT CALLED TO, READ BACK BY AND VERIFIED WITH: [place] NICKERSON RN 11/17/20  BY JW (NOTE) SARS-CoV-2 target nucleic acids are DETECTED.  The SARS-CoV-2 RNA is generally detectable in upper respiratory specimens during the acute phase of infection. Positive results  are indicative of the presence of the identified virus, but do not rule out bacterial infection or co-infection with other pathogens not detected by the test. Clinical correlation with patient history and other diagnostic information is necessary to determine patient infection status. The expected result is Negative.  Fact Sheet for Patients: BloggerCourse.com  Fact Sheet for Healthcare Providers: SeriousBroker.it  This test is not yet approved or cleared by the [place] FDA and  has been authorized for detection and/or diagnosis of SARS-CoV-2 by FDA under an Emergency Use Authorization (EUA).  This EUA will remain in effect (meaning this test c an be used) for the duration of  the COVID-19 declaration under Section 564(b)(1) of the Act, 21 U.S.C. section 360bbb-3(b)(1), unless the authorization is terminated or revoked sooner.     Influenza A by PCR NEGATIVE NEGATIVE Final   Influenza B by PCR NEGATIVE NEGATIVE Final    Comment: (NOTE) The Xpert Xpress SARS-CoV-2/FLU/RSV plus assay is intended as an aid in the diagnosis of influenza from Nasopharyngeal swab specimens and should not be used as a sole basis for treatment. Nasal washings and aspirates are unacceptable for Xpert Xpress SARS-CoV-2/FLU/RSV testing.  Fact Sheet for Patients: BloggerCourse.com  Fact Sheet for Healthcare Providers: SeriousBroker.it  This test is not yet approved or cleared by the [place] FDA and has been authorized for detection and/or diagnosis of SARS-CoV-2 by FDA under an Emergency Use Authorization (EUA). This EUA will remain in effect (meaning this test can be used) for the duration of the COVID-19 declaration under Section 564(b)(1) of the Act, 21 U.S.C. section 360bbb-3(b)(1), unless the authorization is terminated or revoked.  Performed at [facility], [address]  [place], [place] [address]      Radiology Studies: DG Chest Port 1 View  Result Date: 11/17/2020 CLINICAL DATA:  Shortness of breath EXAM: PORTABLE CHEST 1 VIEW COMPARISON:  Chest radiograph 05/13/2019 FINDINGS: The heart is mildly enlarged, unchanged. The mediastinal contours are stable. There is no focal consolidation or pulmonary edema. There is no pleural effusion or pneumothorax. There is no acute osseous abnormality. IMPRESSION: Stable cardiomegaly. Otherwise, no radiographic evidence of acute cardiopulmonary process. Electronically Signed   By: Lesia Hausen M.D.   On: 11/17/2020 16:31   DG Knee Complete 4 Views Right  Result Date: 11/17/2020 CLINICAL DATA:  Knee pain EXAM: RIGHT KNEE - COMPLETE 4+ VIEW COMPARISON:  None. FINDINGS: Small  suprapatellar joint effusion. No acute fracture or dislocation identified. There is sharpening of the tibial spines. Patellofemoral and medial compartment joint space narrowing is identified. IMPRESSION: 1. Small suprapatellar joint effusion. 2. Patellofemoral and medial compartment osteoarthritis. Electronically Signed   By: Signa Kell M.D.   On: 11/17/2020 17:54   VAS [place] LOWER EXTREMITY VENOUS (DVT) (ONLY [facility] & [facility] 7a-7p)  Result Date: 11/17/2020  Lower Venous DVT Study Patient Name:  ANANI GU  Date of Exam:   11/17/2020 Medical Rec #: [id number]     Accession #:    [id number] Date of Birth: 1941-02-07    Patient Gender: F Patient Age:   80 years Exam Location:  [facility] Procedure:      VAS [place] LOWER EXTREMITY VENOUS (DVT) Referring Phys: JOSHUA LONG --------------------------------------------------------------------------------  Indications: Pain, and Edema.  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 Limitations: Body habitus and poor ultrasound/tissue interface. Comparison Study: Previous exam 05/26/2010 Positive for DVT RLE - PTV & LLE - PTV                   and PopV Performing Technologist: Jody Hill RVT, RDMS  Examination Guidelines: A  complete evaluation includes B-mode imaging, spectral Doppler, color Doppler, and power Doppler as needed of all accessible portions of each vessel. Bilateral testing is considered an integral part of a complete examination. Limited examinations for reoccurring indications may be performed as noted. The reflux portion of the exam is performed with the patient in reverse Trendelenburg.  +---------+---------------+---------+-----------+----------+-------------------+ RIGHT    CompressibilityPhasicitySpontaneityPropertiesThrombus Aging      +---------+---------------+---------+-----------+----------+-------------------+ CFV      Full           Yes      Yes                                      +---------+---------------+---------+-----------+----------+-------------------+ SFJ      Full                                                             +---------+---------------+---------+-----------+----------+-------------------+ FV Prox  Full           Yes      Yes                                      +---------+---------------+---------+-----------+----------+-------------------+ FV Mid   Full           Yes      Yes                                      +---------+---------------+---------+-----------+----------+-------------------+ FV DistalFull           Yes      Yes                                      +---------+---------------+---------+-----------+----------+-------------------+ PFV      Full                                                             +---------+---------------+---------+-----------+----------+-------------------+  POP      Full           Yes      Yes                                      +---------+---------------+---------+-----------+----------+-------------------+ PTV      Full                                         Not well visualized +---------+---------------+---------+-----------+----------+-------------------+ PERO     Full                                          Not well visualized +---------+---------------+---------+-----------+----------+-------------------+   +----+---------------+---------+-----------+----------+--------------+ LEFTCompressibilityPhasicitySpontaneityPropertiesThrombus Aging +----+---------------+---------+-----------+----------+--------------+ CFV Full           Yes      Yes                                 +----+---------------+---------+-----------+----------+--------------+     Summary: RIGHT: - There is no evidence of deep vein thrombosis in the lower extremity. - There is no evidence of superficial venous thrombosis.  - A cystic structure is found in the popliteal fossa. Subcutaneous edema in calf area.  LEFT: - No evidence of common femoral vein obstruction.  *See table(s) above for measurements and observations. Electronically signed by Coral Else MD on 11/17/2020 at 7:18:44 PM.    Final      LOS: 0 days   Lanae Boast, MD [facility]  11/18/2020, 7:35 AM

## 2020-11-18 NOTE — Care Management Obs Status (Signed)
MEDICARE OBSERVATION STATUS NOTIFICATION   Patient Details  Name: Virginia Summers MRN: [id number] Date of Birth: 30-Mar-1940   Medicare Observation Status Notification Given:  Yes    Lorri Frederick, LCSW 11/18/2020, 3:29 PM

## 2020-11-18 NOTE — Plan of Care (Signed)

## 2020-11-18 NOTE — ED Notes (Signed)
Report attempted. Floor states Bed has not been approved yet

## 2020-11-18 NOTE — ED Notes (Signed)
Pt sat up on side of bed for a little while to relieve foot cramps. Pt placed back on bed and purewick changed. Pt given trazadone to help her sleep. Pt denies further needs.

## 2020-11-18 NOTE — ED Notes (Signed)
Pt uncomfortable and readjusted in bed. Medicated for cramps.

## 2020-11-18 NOTE — ED Notes (Signed)
Report given to shannon,rn

## 2020-11-18 NOTE — ED Notes (Signed)
Report given to Shannon, RN

## 2020-11-18 NOTE — ED Notes (Signed)
I introduced myself to pt. Pt AxO x4. GCS 15. Daughter at bedside. Pt placed on/off bedpan. Pt denies further needs.

## 2020-11-18 NOTE — ED Notes (Signed)
Report given to Josh N, RN  

## 2020-11-18 NOTE — Consult Note (Signed)
Cardiology Consultation:   Patient ID: Virginia Summers MRN: [id number]; DOB: Jun 09, 1940  Admit date: 11/17/2020 Date of Consult: 11/18/2020  PCP:  Eather Colas, FNP   [facility] Providers Cardiologist:  Kristeen Miss, MD   {    Patient Profile:   Virginia Summers is a 80 y.o. 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 Toniann Fail.  History of Present Illness:   Virginia Summers follows Dr. Elease Hashimoto outpatient for hypertension.  She had chest pain in the past and underwent cardiac catheterization 05/10/2006 which revealed normal coronary arteries and normal left ventricular systolic function.  Last outpatient Echo was done 05/13/2019 with EF 55 to 60%, no regional wall motion abnormality, grade 1 diastolic dysfunction, trivial MR. She had complained heart palpitation during last office visit 04/17/2020, where 14-day event monitor was ordered, revealing sinus rhythm with left bundle branch block, no life-threatening arrhythmia, several episodes of NSVT noted, 1 episodes occurred at night which suspecting due to OSA, she was recommended a sleep study.  Patient is very hard of hearing. Patient presented to the ER 11/17/2020 complaining shortness of breath worsens with exertion over the past 3 to 4 days.  She had stopped taking some of her antihypertensives for the last 2 weeks due to running out of refill. She had reported history of COVID-19 infection 1 month ago where she did have fever and poor PO intake.   She states past 2 days she has not been feeling well with intermittent dizziness. Yesterday she was sitting on the porch cutting some boxes, suddenly felt very queasy with dizziness, also had midsternal chest pain, described it as pressure feeling like someone sitting on her chest.  Pain was nonradiating, not triggered by exertion.  She took 3 tablets of nitroglycerin, unsure if this helped her  symptoms.  She reports associated air hunger and felt like she could not breathe. She recalls having chest discomfort all day yesterday until she arrived to ER and placed on oxygen.  She is chest pain-free currently.   She states she has chronic bronchitis, asthma, and COPD.  She uses a bronchodilator at baseline.  She denies any significant cough, fever, chills.  She is unsure if her COVID symptom is overall improving.  She had reported right knee pain with recent injury, states that she has been having difficulty ambulation and mostly immobile due to right knee pain/injury for the past 3 weeks.  She had noticed some right lower extremity swelling for a month, denies any significant weight gain.  She never completed the sleep study that Dr. Elease Hashimoto recommended.   Admission diagnostic including CBC revealed hemoglobin 11.1, otherwise unremarkable.  CMP with glucose 153, albumin 2.9, BUN 24, creatinine 1.05, GFR 54.  BNP elevated 258.  High sensitive troponin 16 >18 >18 >17.  COVID-19 positive.  Flu negative.  Chest x-ray reveals stable cardiomegaly, no acute cardiopulmonary process.  EKG revealed junctional bradycardia with ventricular rate of 54 bpm, old LBBB, occasional PAC, no acute ischemic changes.   She was hypertensive with blood pressure up to 222/78, and hypoxic requiring 2 L nasal cannula oxygen at ED. She was given IV Lasix and hydralazine with improvement of respiratory status and hypertensive urgency.  Home antihypertensive has been resumed.  Cardiology is consulted today due to chest pressure.  Echocardiogram repeated on 11/17/2020 remains pending.    Past Medical History:  Diagnosis Date   Anticoagulant long-term use    Chest pain    Diabetes mellitus    Diverticulitis    DVT (deep venous thrombosis) (HCC)    BILATERAL, March 2012   GERD (gastroesophageal reflux disease)    HTN (hypertension)    Hypothyroidism    Pulmonary embolism (HCC)     Past Surgical History:  Procedure  Laterality Date   APPENDECTOMY  AGE 38   BACK SURGERY  2000   DISC   PARTIAL HYSTERECTOMY  AGE 10's APPROX.   SECONDARY INTRAOCULAR LENSE IMPLANTATION  2005,2006   OU   SHOULDER SURGERY  5-6 YRS OLD   PINS     Home Medications:  Prior to Admission medications   Medication Sig Start Date End Date Taking? Authorizing Provider  acetaminophen (TYLENOL) 500 MG tablet Take 1,000 mg by mouth every 6 (six) hours as needed for moderate pain or headache.   Yes [provider]  albuterol (VENTOLIN HFA) 108 (90 Base) MCG/ACT inhaler Inhale 2 puffs into the lungs every 4 (four) hours as needed. For shortness of breath.   Yes [provider]  Artificial Tear Ointment (DRY EYES OP) Apply 1 drop to eye 2 (two) times daily as needed (dry eyes).   Yes [provider]  atorvastatin (LIPITOR) 20 MG tablet Take 20 mg by mouth daily. 05/29/19  Yes [provider]  budesonide-formoterol (SYMBICORT) 80-4.5 MCG/ACT inhaler Inhale 2 puffs into the lungs 2 (two) times daily.   Yes [provider]  carvedilol (COREG) 12.5 MG tablet Take 1 tablet (12.5 mg total) by mouth 2 (two) times daily with a meal. 04/13/20  Yes Nahser, Deloris Ping, MD  chlorthalidone (HYGROTON) 25 MG tablet Take 1 tablet by mouth once daily Patient taking differently: Take 25 mg by mouth daily. 07/20/20  Yes Nahser, Deloris Ping, MD  citalopram (CELEXA) 20 MG tablet Take 20 mg by mouth daily.  04/08/15  Yes [provider]  DULoxetine (CYMBALTA) 60 MG capsule Take 60 mg by mouth daily. 05/30/19  Yes [provider]  fenofibrate (TRICOR) 145 MG tablet Take 1 tablet (145 mg total) by mouth daily. 04/20/20  Yes Nahser, Deloris Ping, MD  fluticasone [facility]) 50 MCG/ACT nasal spray Place 2 sprays into both nostrils daily as needed for allergies or rhinitis. 09/26/18  Yes [provider]  hydrALAZINE (APRESOLINE) 25 MG tablet TAKE 1 TABLET BY MOUTH THREE TIMES DAILY Patient taking differently: Take  25 mg by mouth 3 (three) times daily. 09/09/20  Yes Nahser, Deloris Ping, MD  levothyroxine (SYNTHROID, LEVOTHROID) 25 MCG tablet TAKE 1 TABLET (25 MCG TOTAL) BY MOUTH DAILY. Patient taking differently: Take 25 mcg by mouth daily before breakfast. 03/08/13  Yes Nahser, Deloris Ping, MD  metFORMIN (GLUCOPHAGE-XR) 500 MG 24 hr tablet Take 500 mg by mouth 2 (two) times daily before a meal.   Yes [provider]  Multiple Vitamins-Minerals (AIRBORNE PO) Take 1 tablet by mouth daily as needed (cold symtoms).   Yes [provider]  OVER THE COUNTER MEDICATION Take 1 tablet by mouth daily. beets   Yes [provider]  potassium chloride (KLOR-CON) 10 MEQ tablet Take 1 tablet by mouth once daily Patient taking differently: Take 10 mEq by mouth daily. 05/11/20  Yes Nahser, Deloris Ping, MD  traZODone (DESYREL) 50 MG tablet Take 50 mg by mouth at bedtime as needed for sleep.  09/18/17  Yes [provider]  valsartan (DIOVAN) 320 MG tablet Take 1 tablet by mouth  once daily Patient taking differently: Take 320 mg by mouth daily. 05/11/20  Yes Nahser, Deloris Ping, MD  XARELTO 10 MG TABS tablet Take 1 tablet by mouth once daily Patient taking differently: Take 10 mg by mouth daily. 06/22/20  Yes Nahser, Deloris Ping, MD  predniSONE (DELTASONE) 1 MG tablet Take 2 tablets by mouth as directed. take 2 tablets by mouth once daily for 4 weeks, then 1 tablet once daily for 4 weeks,, then 1/2 tablet once for 4 weeks, then stop Patient not taking: No sig reported 05/29/19   [provider]    Inpatient Medications: Scheduled Meds:  atorvastatin  20 mg Oral Daily   carvedilol  12.5 mg Oral BID WC   chlorthalidone  25 mg Oral Daily   DULoxetine  30 mg Oral Daily   fenofibrate  160 mg Oral Daily   hydrALAZINE  25 mg Oral TID   insulin aspart  0-9 Units Subcutaneous TID WC   irbesartan  300 mg Oral Daily   levothyroxine  25 mcg Oral Q0600   mometasone-formoterol  2 puff Inhalation BID    rivaroxaban  10 mg Oral Daily   Continuous Infusions:  PRN Meds: acetaminophen **OR** acetaminophen, albuterol, hydrALAZINE, traZODone  Allergies:    Allergies  Allergen Reactions   Penicillins Swelling and Rash    Did it involve swelling of the face/tongue/throat, SOB, or low BP? No Did it involve sudden or severe rash/hives, skin peeling, or any reaction on the inside of your mouth or nose? No Did you need to seek medical attention at a hospital or doctor's office? No When did it last happen?    childhood   If all above answers are "NO", may proceed with cephalosporin use.   Sulfa Drugs Cross Reactors Swelling and Rash    Social History:   Social History   Socioeconomic History   Marital status: Married    Spouse name: Not on file   Number of children: Not on file   Years of education: Not on file   Highest education level: Not on file  Occupational History   Not on file  Tobacco Use   Smoking status: Former    Packs/day: 0.50    Years: 30.00    Pack years: 15.00    Types: Cigarettes    Quit date: 03/15/2003    Years since quitting: 17.6   Smokeless tobacco: Never  Substance and Sexual Activity   Alcohol use: No   Drug use: No   Sexual activity: Not on file  Other Topics Concern   Not on file  Social History Narrative   Not on file   Social Determinants of Health   Financial Resource Strain: Not on file  Food Insecurity: Not on file  Transportation Needs: Not on file  Physical Activity: Not on file  Stress: Not on file  Social Connections: Not on file  Intimate Partner Violence: Not on file    Family History:    Family History  Problem Relation Age of Onset   Transient ischemic attack Father    Coronary artery disease Father    Stroke Mother    Diabetes Mother    Diabetes Sister    Hypertension Daughter      ROS:  Constitutional: see HPI  Eyes: Denied vision change or loss Ears/Nose/Mouth/Throat: Denied ear ache, sore throat, coughing, sinus  pain Cardiovascular: see HPI  Respiratory: see HPI  Gastrointestinal: Denied nausea, vomiting, abdominal pain, diarrhea Genital/Urinary: Denied dysuria, hematuria, urinary frequency/urgency Musculoskeletal: see  HPI  Skin: Denied rash, wound Neuro: Denied headache, dizziness, syncope Psych: Denied history of depression/anxiety  Endocrine: history of diabetes   Physical Exam/Data:   Vitals:   11/18/20 0730 11/18/20 0745 11/18/20 0800 11/18/20 0815  BP: (!) 119/37 (!) 118/34 (!) 122/39 (!) 108/44  Pulse: 70 67 73 76  Resp: Temp:      TempSrc:      SpO2: 97% 97% 97% 97%    Intake/Output Summary (Last 24 hours) at 11/18/2020 0921 Last data filed at 11/17/2020 2309 Gross per 24 hour  Intake --  Output 1000 ml  Net -1000 ml   Last 3 Weights 04/17/2020 06/04/2019 05/13/2019  Weight (lbs) 190 lb 196 lb 12 oz 190 lb 7.6 oz  Weight (kg) 86.183 kg 89.245 kg 86.4 kg     There is no height or weight on file to calculate BMI.   Vitals:  Vitals:   11/18/20 0800 11/18/20 0815  BP: (!) 122/39 (!) 108/44  Pulse: 73 76  Resp: 13 16  Temp:    SpO2: 97% 97%   General Appearance: In no apparent distress, laying in bed HEENT: Normocephalic, atraumatic. EOMs intact.  Hard of hearing  neck: Supple, trachea midline, short and thick neck, difficult assess JVD Cardiovascular: Regular rate and rhythm, normal S1-S2,  no murmur/rub/gallop Respiratory: Resting breathing unlabored, lungs sounds with upper airway congestion and a fine crackles bilaterally, no use of accessory muscles. On 2 L nasal cannula oxygen with pulse ox 98%.  Speaks full sentences. Gastrointestinal: Bowel sounds positive, abdomen soft, non-tender, non-distended.  Extremities: Able to move all extremities in bed without difficulty, RLE swelling >LLE swelling, 1- edema noted bilaterally.  Right knee bruise noted.  Right lower extremity tender to touch. Genitourinary:  genital exam not performed Musculoskeletal: Normal  muscle bulk and tone, no focal weakness noted  skin: Intact, warm, dry. No rashes or petechiae noted in exposed areas.  Neurologic: Alert, oriented to person, place and time. Fluent speech,  no cognitive deficit,  no gross focal neuro deficit Psychiatric: Normal affect. Mood is appropriate.    EKG:  The EKG was personally reviewed and demonstrates: Junctional bradycardia with ventricular rate of 54 bpm, occasional PAC, old LBBB  Telemetry:  Telemetry was personally reviewed and demonstrates: Sinus rhythm/sinus tachycardia with ventricular rate of 70-110s   Relevant CV Studies:  Echocardiogram from today is pending  RLE Doppler 11/17/2020 without evidence of DVT  14 days event monitor from 05/28/2020:  Sinus rhythm with bundle branche block Occasional episodes of nonsustained SVT. One episode occured at 2:30 AM and may be due to osbstructive sleep apnea. Suggest sleep study if not already done . No significant arrhythmias   Echocardiogram from 05/13/2019:   1. Left ventricular ejection fraction, by estimation, is 55 to 60%. The  left ventricle has normal function. The left ventricle has no regional  wall motion abnormalities. There is severe concentric left ventricular  hypertrophy. Left ventricular diastolic   parameters are consistent with Grade I diastolic dysfunction (impaired  relaxation).   2. Right ventricular systolic function is normal. The right ventricular  size is normal. There is normal pulmonary artery systolic pressure.   3. The mitral valve is normal in structure and function. Trivial mitral  valve regurgitation. No evidence of mitral stenosis.   4. The aortic valve is normal in structure and function. Aortic valve  regurgitation is not visualized. No aortic stenosis is present.   Laboratory Data:  High Sensitivity Troponin:  Recent Labs  Lab 11/17/20 1711 11/17/20 1943 11/17/20 2159 11/17/20 2347  TROPONINIHS 16 18* 18* 17     Chemistry Recent Labs   Lab 11/17/20 1711  NA 138  K 4.8  CL 109  CO2 23  GLUCOSE 153*  BUN 24*  CREATININE 1.05*  CALCIUM 8.0*  GFRNONAA 54*  ANIONGAP 6    Recent Labs  Lab 11/17/20 1635  PROT 6.0*  ALBUMIN 2.9*  AST 25  ALT 11  ALKPHOS 49  BILITOT 0.3   Hematology Recent Labs  Lab 11/17/20 1553  WBC 9.0  RBC 4.25  HGB 11.1*  HCT 36.3  MCV 85.4  MCH 26.1  MCHC 30.6  RDW 14.3  PLT 293   BNP Recent Labs  Lab 11/17/20 1635  BNP 258.7*    DDimer No results for input(s): DDIMER in the last 168 hours.   Radiology/Studies:  DG Chest Port 1 View  Result Date: 11/17/2020 CLINICAL DATA:  Shortness of breath EXAM: PORTABLE CHEST 1 VIEW COMPARISON:  Chest radiograph 05/13/2019 FINDINGS: The heart is mildly enlarged, unchanged. The mediastinal contours are stable. There is no focal consolidation or pulmonary edema. There is no pleural effusion or pneumothorax. There is no acute osseous abnormality. IMPRESSION: Stable cardiomegaly. Otherwise, no radiographic evidence of acute cardiopulmonary process. Electronically Signed   By: Lesia HausenPeter  Noone M.D.   On: 11/17/2020 16:31   DG Knee Complete 4 Views Right  Result Date: 11/17/2020 CLINICAL DATA:  Knee pain EXAM: RIGHT KNEE - COMPLETE 4+ VIEW COMPARISON:  None. FINDINGS: Small suprapatellar joint effusion. No acute fracture or dislocation identified. There is sharpening of the tibial spines. Patellofemoral and medial compartment joint space narrowing is identified. IMPRESSION: 1. Small suprapatellar joint effusion. 2. Patellofemoral and medial compartment osteoarthritis. Electronically Signed   By: Signa Kellaylor  Stroud M.D.   On: 11/17/2020 17:54   VAS US LOWER EXTREMITY VENOUS (DVT) (ONLY [facility] & [facility] 7a-7p)  Result Date: 11/17/2020  Lower Venous DVT Study Patient Name:  Virginia Summers  Date of Exam:   11/17/2020 Medical Rec #: [id number]     Accession #:    [id number] Date of Birth: Jan 14, 1941    Patient Gender: F Patient Age:   [age] years [facility] Procedure:      VAS US LOWER EXTREMITY VENOUS (DVT) Referring Phys: JOSHUA LONG --------------------------------------------------------------------------------  Indications: Pain, and Edema.  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 Limitations: Body habitus and poor ultrasound/tissue interface. Comparison Study: Previous exam 05/26/2010 Positive for DVT RLE - PTV & LLE - PTV                   and PopV Performing Technologist: Jody Hill RVT, RDMS  Examination Guidelines: A complete evaluation includes B-mode imaging, spectral Doppler, color Doppler, and power Doppler as needed of all accessible portions of each vessel. Bilateral testing is considered an integral part of a complete examination. Limited examinations for reoccurring indications may be performed as noted. The reflux portion of the exam is performed with the patient in reverse Trendelenburg.  +---------+---------------+---------+-----------+----------+-------------------+ RIGHT    CompressibilityPhasicitySpontaneityPropertiesThrombus Aging      +---------+---------------+---------+-----------+----------+-------------------+ CFV      Full           Yes      Yes                                      +---------+---------------+---------+-----------+----------+-------------------+  SFJ      Full                                                             +---------+---------------+---------+-----------+----------+-------------------+ FV Prox  Full           Yes      Yes                                      +---------+---------------+---------+-----------+----------+-------------------+ FV Mid   Full           Yes      Yes                                      +---------+---------------+---------+-----------+----------+-------------------+ FV DistalFull           Yes      Yes                                      +---------+---------------+---------+-----------+----------+-------------------+ PFV       Full                                                             +---------+---------------+---------+-----------+----------+-------------------+ POP      Full           Yes      Yes                                      +---------+---------------+---------+-----------+----------+-------------------+ PTV      Full                                         Not well visualized +---------+---------------+---------+-----------+----------+-------------------+ PERO     Full                                         Not well visualized +---------+---------------+---------+-----------+----------+-------------------+   +----+---------------+---------+-----------+----------+--------------+ LEFTCompressibilityPhasicitySpontaneityPropertiesThrombus Aging +----+---------------+---------+-----------+----------+--------------+ CFV Full           Yes      Yes                                 +----+---------------+---------+-----------+----------+--------------+     Summary: RIGHT: - There is no evidence of deep vein thrombosis in the lower extremity. - There is no evidence of superficial venous thrombosis.  - A cystic structure is found in the popliteal fossa. Subcutaneous edema in calf area.  LEFT: - No evidence of common femoral vein obstruction.  *See table(s) above for measurements and observations. Electronically signed by Faylene Million  Brabham MD on 11/17/2020 at 7:18:44 PM.    Final      Assessment and Plan:   Chest pain DOE -Presented with 2 days onset of dizziness with nausea, midsternum chest pain occurred 11/17/2020 lasting all day, not triggered by exertion, relieved after receiving nitro and oxygen support - Hs trop negative x4 - EKG with junctional bradycardia 50s, old LBBB - Echo pending today  - CXR no acute findings  - Cath from 2008 with normal coronary arteries  - suspect chest discomfort from HTN urgency + COVID-19 infection/postviral pneumonitis - consider PE workup ,  defer to IM - discussed obtaining stress Myovue here for ischemic evaluation, patient agreeable, will review with MD  - will update lipid profile and A1C   Acute hypoxic respiratory failure -None oxygen dependent at baseline, currently requires 2 L  oxygen support at ED - BNP mildly elevated >200s, s/p IV Lasix 40mg  x1, query flash pulmonary edema although CXR clear, Echo is pending to evaluate for acute heart failure - wean off oxygen as tolerated   Hypertensive urgency Medication noncompliance -Patient stopped taking some of her antihypertensives 2 weeks ago due to running out of refills -Home antihypertensive had been resumed including carvedilol, hydralazine, chlorthalidone, irbesartan -BP control significantly improved at this time  HLD - on statin, update lipid panel   History of DVT and PE -Doppler from this admission negative for DVT -Chronic anticoagulation with Xarelto 10 mg daily continued -Given new onset hypoxia, short of breath, chest pain, may consider PE study, defer to IM   COVID-19 infection -Self reports positive since 1 month ago, had fever and poor PO intake  -She does have new onset hypoxia/SOB/chest pressure, consider thromboembolic work-up and postviral component  Right knee pain and injury  Type 2 diabetes CKD stage IIIa Hypothyroidism - managed per IM     Risk Assessment/Risk Scores:   HEAR Score (for undifferentiated chest pain):  HEAR Score: 6    For questions or updates, please contact [facility] Please consult [email or web address] for contact info under  Signed, , NP  11/18/2020 9:21 AM   As above, patient seen and examined.  Briefly she is an 80 year old female with past medical history of hypertension, previous pulmonary embolus on Xarelto, hypothyroidism, diabetes mellitus, hyperlipidemia for evaluation of chest pain and palpitations.  Patient did have a cardiac catheterization in 2008 revealing normal coronary arteries.  Last  echocardiogram in March 2021 showed normal LV function.  Patient states that over the past 4 days she has had intermittent palpitations described as her heart racing.  She also describes dizziness.  She has had the sensation as though she could not take a deep breath.  Yesterday she had chest pressure that was substernal and without radiation.  Also some nausea.  The pain persisted all day and she presented to the emergency room for further evaluation.  Note her pain was not exertional.  Patient does states she had COVID diagnosed 4 weeks ago.  Chest x-ray showed cardiomegaly.  BNP 258.  Troponin I 16, 18, 18 and 17.  Electrocardiogram shows ectopic atrial rhythm with occasional PAC and left bundle branch block which is old.  She is positive for coronavirus.  1 chest pain-symptoms are extremely atypical.  They have been present for 24 hours without completely resolving and enzymes are not consistent with acute coronary syndrome.  Electrocardiogram shows left bundle branch block which is old.  We will await echocardiogram.  If wall motion normal she can have an  outpatient Lexiscan nuclear study for risk stratification.  I do not think she needs further inpatient evaluation.  2 COVID-positive-Per primary service.  3 hypertensive urgency-blood pressure was elevated at time of admission but improved today.  Continue present medications and follow.  4 palpitations-previous monitor showed PVCs and nonsustained ventricular tachycardia.  Continue beta-blocker.  Can consider repeat monitor if her symptoms persist.  Olga Millers, MD

## 2020-11-19 DIAGNOSIS — Z6838 Body mass index (BMI) 38.0-38.9, adult: Secondary | ICD-10-CM | POA: Diagnosis not present

## 2020-11-19 DIAGNOSIS — E662 Morbid (severe) obesity with alveolar hypoventilation: Secondary | ICD-10-CM | POA: Diagnosis present

## 2020-11-19 DIAGNOSIS — Z86711 Personal history of pulmonary embolism: Secondary | ICD-10-CM | POA: Diagnosis not present

## 2020-11-19 DIAGNOSIS — Z79890 Hormone replacement therapy (postmenopausal): Secondary | ICD-10-CM | POA: Diagnosis not present

## 2020-11-19 DIAGNOSIS — N390 Urinary tract infection, site not specified: Secondary | ICD-10-CM | POA: Diagnosis present

## 2020-11-19 DIAGNOSIS — Z86718 Personal history of other venous thrombosis and embolism: Secondary | ICD-10-CM | POA: Diagnosis not present

## 2020-11-19 DIAGNOSIS — Z823 Family history of stroke: Secondary | ICD-10-CM | POA: Diagnosis not present

## 2020-11-19 DIAGNOSIS — J9690 Respiratory failure, unspecified, unspecified whether with hypoxia or hypercapnia: Secondary | ICD-10-CM | POA: Diagnosis present

## 2020-11-19 DIAGNOSIS — E871 Hypo-osmolality and hyponatremia: Secondary | ICD-10-CM | POA: Diagnosis present

## 2020-11-19 DIAGNOSIS — U071 COVID-19: Secondary | ICD-10-CM | POA: Diagnosis not present

## 2020-11-19 DIAGNOSIS — N179 Acute kidney failure, unspecified: Secondary | ICD-10-CM | POA: Diagnosis present

## 2020-11-19 DIAGNOSIS — E875 Hyperkalemia: Secondary | ICD-10-CM | POA: Diagnosis present

## 2020-11-19 DIAGNOSIS — R072 Precordial pain: Secondary | ICD-10-CM | POA: Diagnosis present

## 2020-11-19 DIAGNOSIS — R001 Bradycardia, unspecified: Secondary | ICD-10-CM | POA: Diagnosis not present

## 2020-11-19 DIAGNOSIS — Z833 Family history of diabetes mellitus: Secondary | ICD-10-CM | POA: Diagnosis not present

## 2020-11-19 DIAGNOSIS — I160 Hypertensive urgency: Secondary | ICD-10-CM | POA: Diagnosis present

## 2020-11-19 DIAGNOSIS — N1831 Chronic kidney disease, stage 3a: Secondary | ICD-10-CM | POA: Diagnosis present

## 2020-11-19 DIAGNOSIS — E11649 Type 2 diabetes mellitus with hypoglycemia without coma: Secondary | ICD-10-CM | POA: Diagnosis not present

## 2020-11-19 DIAGNOSIS — Z8616 Personal history of COVID-19: Secondary | ICD-10-CM | POA: Diagnosis not present

## 2020-11-19 DIAGNOSIS — Z8249 Family history of ischemic heart disease and other diseases of the circulatory system: Secondary | ICD-10-CM | POA: Diagnosis not present

## 2020-11-19 DIAGNOSIS — D631 Anemia in chronic kidney disease: Secondary | ICD-10-CM | POA: Diagnosis present

## 2020-11-19 DIAGNOSIS — E1122 Type 2 diabetes mellitus with diabetic chronic kidney disease: Secondary | ICD-10-CM | POA: Diagnosis present

## 2020-11-19 DIAGNOSIS — E039 Hypothyroidism, unspecified: Secondary | ICD-10-CM | POA: Diagnosis present

## 2020-11-19 DIAGNOSIS — J9601 Acute respiratory failure with hypoxia: Secondary | ICD-10-CM | POA: Diagnosis present

## 2020-11-19 DIAGNOSIS — J449 Chronic obstructive pulmonary disease, unspecified: Secondary | ICD-10-CM | POA: Diagnosis present

## 2020-11-19 DIAGNOSIS — E041 Nontoxic single thyroid nodule: Secondary | ICD-10-CM | POA: Diagnosis present

## 2020-11-19 DIAGNOSIS — I129 Hypertensive chronic kidney disease with stage 1 through stage 4 chronic kidney disease, or unspecified chronic kidney disease: Secondary | ICD-10-CM | POA: Diagnosis present

## 2020-11-19 LAB — GLUCOSE, CAPILLARY
Glucose-Capillary: 179 mg/dL — ABNORMAL HIGH (ref 70–99)
Glucose-Capillary: 170 mg/dL — ABNORMAL HIGH (ref 70–99)
Glucose-Capillary: 232 mg/dL — ABNORMAL HIGH (ref 70–99)
Glucose-Capillary: 205 mg/dL — ABNORMAL HIGH (ref 70–99)

## 2020-11-19 MED ORDER — IPRATROPIUM-ALBUTEROL 0.5-2.5 (3) MG/3ML IN SOLN
3.0000 mL | Freq: Four times a day (QID) | RESPIRATORY_TRACT | Status: DC | PRN
  Administered 2020-11-19 – 2020-11-21 (×2): 3 mL via RESPIRATORY_TRACT
  Filled 2020-11-19 (×2): qty 3

## 2020-11-19 MED ORDER — LACTATED RINGERS IV BOLUS
500.0000 mL | Freq: Once | INTRAVENOUS | Status: AC
  Administered 2020-11-19: 500 mL via INTRAVENOUS

## 2020-11-19 MED ORDER — FUROSEMIDE 10 MG/ML IJ SOLN
40.0000 mg | Freq: Once | INTRAMUSCULAR | Status: AC
  Administered 2020-11-19: 40 mg via INTRAVENOUS
  Filled 2020-11-19: qty 4

## 2020-11-19 MED ORDER — PREDNISONE 20 MG PO TABS
20.0000 mg | ORAL_TABLET | Freq: Every day | ORAL | Status: DC
  Administered 2020-11-19 – 2020-11-21 (×3): 20 mg via ORAL
  Filled 2020-11-19 (×3): qty 1

## 2020-11-19 MED ORDER — FLUTICASONE PROPIONATE 50 MCG/ACT NA SUSP
2.0000 | Freq: Every day | NASAL | Status: DC
  Administered 2020-11-19 – 2020-11-23 (×5): 2 via NASAL
  Filled 2020-11-19: qty 16

## 2020-11-19 MED ORDER — GUAIFENESIN 100 MG/5ML PO SOLN
5.0000 mL | ORAL | Status: DC | PRN
  Filled 2020-11-19: qty 5

## 2020-11-19 MED ORDER — ONDANSETRON HCL 4 MG/2ML IJ SOLN
4.0000 mg | Freq: Four times a day (QID) | INTRAMUSCULAR | Status: DC | PRN
  Administered 2020-11-19 – 2020-11-23 (×4): 4 mg via INTRAVENOUS
  Filled 2020-11-19 (×4): qty 2

## 2020-11-19 NOTE — Progress Notes (Addendum)
PROGRESS NOTE    Virginia Summers  Virginia Summers DOB: Jul 11, 1940 DOA: 11/17/2020 PCP: Eather Colas, FNP   Chief Complaint  Patient presents with   Shortness of Breath   Chest Pain    Brief Narrative:  80 year old female with history of hypertension, PE, hypothyroidism, diabetes presented with shortness of breath over the last 3 days 4 days, worse with exertion, has not been taking her medication for last 2 weeks due to running out of them.  Also complaining of right knee pain after she had abnormal movement when she twisted her leg about 3 weeks ago.  Recently diagnosed with COVID a month ago when she had a fever and upper respiratory tract symptoms for 10 days.  In the ED, found to have blood pressure more than 200 systolic.  Patient was afebrile.  Chest x-ray was unremarkable.  Since patient's lower extremity was slightly swollen Dopplers were done which were negative for PE.  Labs show creatinine 1.05 hemoglobin 9.1 BNP of 258 high sensitive troponins were 16 and 18.  COVID test was positive.  Patient was given Lasix 40 mg IV and IV hydralazine.  Following which blood pressure improved and patient admitted for further observation Cardiology was consulted.Serial troponin 16>18>18>17.  Blood pressure stabilizing resume home meds.Still on oxygen and weaning down.   Subjective: Complains of having cough with sputum Dysurea-and her urine was positive for UTI and placed antibiotic yesterday. Still not feeling well does not feel comfortable going home needing oxygen still  Assessment & Plan:  Acute respiratory failure with hypoxemia  Chest pain Cough Recent COVID-19 infection Work-up with mildly positive troponin no delta, bnp mildly up at 258 from 216. chest x-ray with stable cardiomegaly.  echocardiogram shows normal EF, grade 1 diastolic dysfunction, cardiology does not feel need for any inpatient work-up and they will arrange for outpatient nuclear stress test.   CT angio chest no PE but  has scattered areas of mild peripheral groundglass likely infectious or inflammatory in etiology, suspect due to recent COVID.  Given Lasix IV x1 this am, continue bronchodilators, antitussive, antibiotics  and prednisone added to treat possible bronchitis.  Wean oxygen as tolerated  Hypertension urgency: BP stabilized, on multiple home meds including Coreg, hydralazine, ARB, HCTZ.  Status post IV Lasix in the ED and repeated today to help with her breathing   Chest pressure likely from uncontrolled hypertension 2D echo normal EF, troponin flat cardiology input appreciated and plan for outpatient stress test.   UTI patient complaining of dysuria UA abnormal - added on ciprofloxacin as penicillin allergy, pending further culture report continue current antibiotics  Right knee pain-x-ray showed osteoarthritis.    History of DVT/PE Dopplers negative for DVT.  Cont her xarelto  Chronic anemia likely from chronic kidney disease. hb is stable. Recent Labs  Lab 11/17/20 1553  HGB 11.1*  HCT 36.3    CKD stage IIIa-stable Recent Labs    04/17/20 1503 11/17/20 1711 11/18/20 1116  BUN 20 24* 22  CREATININE 1.19* 1.05* 1.03*   Hypomagnesia-repleted. HLD cont statin Hypothyroidism on Synthroid Diabetes mellitus type 2 in obese: Blood sugar well controlled.  Continue sliding scale Recent Labs  Lab 11/18/20 1113 11/18/20 1627 11/18/20 2120 11/19/20 0738 11/19/20 1137  GLUCAP 168* 193* 207* 179* 170*     Recent COVID-19 virus infection a month ago.  No active infection.  Thyroid nodules: Ultrasound thyroid benign no need for biopsy.  Diet Order  Diet heart healthy/carb modified Room service appropriate? Yes; Fluid consistency: Thin  Diet effective now                    Patient's Body mass index is 38.92 kg/m.  DVT prophylaxis: rivaroxaban (XARELTO) tablet 10 mg Start: 11/18/20 1000 Code Status:   Code Status: Full Code  Family Communication: plan of care  discussed with patient at bedside. Status is: Observation Remains hospitalized for ongoing management Dispo: The patient is from: Home              Anticipated d/c is to: Home obtain PT OT evaluation              Patient currently is not medically stable to d/c.   Difficult to place patient No  Unresulted Labs (From admission, onward)     Start     Ordered   11/18/20 1637  Urine Culture  Once,   R       Question:  Indication  Answer:  Dysuria   11/18/20 1636           Medications reviewed:  Scheduled Meds:  atorvastatin  20 mg Oral Daily   carvedilol  12.5 mg Oral BID WC   chlorthalidone  25 mg Oral Daily   ciprofloxacin  500 mg Oral BID   DULoxetine  30 mg Oral Daily   fenofibrate  160 mg Oral Daily   hydrALAZINE  25 mg Oral TID   insulin aspart  0-9 Units Subcutaneous TID WC   irbesartan  300 mg Oral Daily   levothyroxine  25 mcg Oral Q0600   magnesium oxide  400 mg Oral BID   melatonin  3 mg Oral QHS   mometasone-formoterol  2 puff Inhalation BID   predniSONE  20 mg Oral Q breakfast   rivaroxaban  10 mg Oral Daily   Continuous Infusions: Consultants:see note  Procedures:see note Antimicrobials: Anti-infectives (From admission, onward)    Start     Dose/Rate Route Frequency Ordered Stop   11/18/20 2100  ciprofloxacin (CIPRO) tablet 500 mg       Note to Pharmacy: UTI   500 mg Oral 2 times daily 11/18/20 2008 11/21/20 1959   11/18/20 0000  doxycycline (DORYX) 100 MG EC tablet        100 mg Oral 2 times daily 11/18/20 1410 11/25/20 2359      Culture/Microbiology No results found for: SDES, SPECREQUEST, CULT, REPTSTATUS  Other culture-see note  Objective: Vitals: Today's Vitals   11/19/20 0617 11/19/20 0631 11/19/20 0730 11/19/20 1222  BP: (!) 141/49  (!) 138/50   Pulse: 72  73   Resp:      Temp: 98.9 F (37.2 C)  98.1 F (36.7 C)   TempSrc: Oral  Oral   SpO2: 97%  97%   Weight:      PainSc:  3  0-No pain 7     Intake/Output Summary (Last 24  hours) at 11/19/2020 1303 Last data filed at 11/19/2020 0551 Gross per 24 hour  Intake --  Output 550 ml  Net -550 ml    Filed Weights   11/19/20 0500  Weight: 87.4 kg   Weight change:   Intake/Output from previous day: 09/07 0701 - 09/08 0700 In: -  Out: 550 [Urine:550] Intake/Output this shift: No intake/output data recorded. Filed Weights   11/19/20 0500  Weight: 87.4 kg    Examination: General exam: AAOx3, obese pleasant, not in distress, intermittent coughing.  HEENT:Oral mucosa  moist, Ear/Nose WNL grossly, dentition normal. Respiratory system: bilaterally diminished, with expiratory wheezing, no use of accessory muscle Cardiovascular system: S1 & S2 +, No JVD,. Gastrointestinal system: Abdomen soft, NT,ND, BS+ Nervous System:Alert, awake, moving extremities and grossly nonfocal Extremities: no edema, distal peripheral pulses palpable.  Skin: No rashes,no icterus. MSK: Normal muscle bulk,tone, power   Data Reviewed: I have personally reviewed following labs and imaging studies CBC: Recent Labs  Lab 11/17/20 1553  WBC 9.0  NEUTROABS 5.6  HGB 11.1*  HCT 36.3  MCV 85.4  PLT 293    Basic Metabolic Panel: Recent Labs  Lab 11/17/20 1711 11/18/20 1116  NA 138 137  K 4.8 4.0  CL 109 99  CO2 23 28  GLUCOSE 153* 172*  BUN 24* 22  CREATININE 1.05* 1.03*  CALCIUM 8.0* 9.3  MG  --  1.5*    GFR: CrCl cannot be calculated (Unknown ideal weight.). Liver Function Tests: Recent Labs  Lab 11/17/20 1635  AST 25  ALT 11  ALKPHOS 49  BILITOT 0.3  PROT 6.0*  ALBUMIN 2.9*    Recent Labs  Lab 11/17/20 1635  LIPASE 34    No results for input(s): AMMONIA in the last 168 hours. Coagulation Profile: No results for input(s): INR, PROTIME in the last 168 hours. Cardiac Enzymes: No results for input(s): CKTOTAL, CKMB, CKMBINDEX, TROPONINI in the last 168 hours. BNP (last 3 results) No results for input(s): PROBNP in the last 8760 hours. HbA1C: Recent Labs     11/18/20 1116  HGBA1C 8.7*   CBG: Recent Labs  Lab 11/18/20 1113 11/18/20 1627 11/18/20 2120 11/19/20 0738 11/19/20 1137  GLUCAP 168* 193* 207* 179* 170*    Lipid Profile: Recent Labs    11/18/20 1116  CHOL 231*  HDL 35*  LDLCALC 153*  TRIG 214*  CHOLHDL 6.6   Thyroid Function Tests: No results for input(s): TSH, T4TOTAL, FREET4, T3FREE, THYROIDAB in the last 72 hours. Anemia Panel: No results for input(s): VITAMINB12, FOLATE, FERRITIN, TIBC, IRON, RETICCTPCT in the last 72 hours. Sepsis Labs: No results for input(s): PROCALCITON, LATICACIDVEN in the last 168 hours.  Recent Results (from the past 240 hour(s))  Resp Panel by RT-PCR (Flu A&B, Covid) Nasopharyngeal Swab     Status: Abnormal   Collection Time: 11/17/20  4:05 PM   Specimen: Nasopharyngeal Swab; Nasopharyngeal(NP) swabs in vial transport medium  Result Value Ref Range Status   SARS Coronavirus 2 by RT PCR POSITIVE (A) NEGATIVE Final    Comment: RESULT CALLED TO, READ BACK BY AND VERIFIED WITH: [place] NICKERSON RN 11/17/20 @1642  BY JW (NOTE) SARS-CoV-2 target nucleic acids are DETECTED.  The SARS-CoV-2 RNA is generally detectable in upper respiratory specimens during the acute phase of infection. Positive results are indicative of the presence of the identified virus, but do not rule out bacterial infection or co-infection with other pathogens not detected by the test. Clinical correlation with patient history and other diagnostic information is necessary to determine patient infection status. The expected result is Negative.  Fact Sheet for Patients:  Fact Sheet for Healthcare Providers: BloggerCourse.com  This test is not yet approved or cleared by the [place] FDA and  has been authorized for detection and/or diagnosis of SARS-CoV-2 by FDA under an Emergency Use Authorization (EUA).  This EUA will remain in effect (meaning  this test c an be used) for the duration of  the COVID-19 declaration under Section 564(b)(1) of the Act, 21 U.S.C. section 360bbb-3(b)(1), unless the authorization  is terminated or revoked sooner.     Influenza A by PCR NEGATIVE NEGATIVE Final   Influenza B by PCR NEGATIVE NEGATIVE Final    Comment: (NOTE) The Xpert Xpress SARS-CoV-2/FLU/RSV plus assay is intended as an aid in the diagnosis of influenza from Nasopharyngeal swab specimens and should not be used as a sole basis for treatment. Nasal washings and aspirates are unacceptable for Xpert Xpress SARS-CoV-2/FLU/RSV testing.  Fact Sheet for Patients: BloggerCourse.com  Fact Sheet for Healthcare Providers: SeriousBroker.it  This test is not yet approved or cleared by the [place] FDA and has been authorized for detection and/or diagnosis of SARS-CoV-2 by FDA under an Emergency Use Authorization (EUA). This EUA will remain in effect (meaning this test can be used) for the duration of the COVID-19 declaration under Section 564(b)(1) of the Act, 21 U.S.C. section 360bbb-3(b)(1), unless the authorization is terminated or revoked.  Performed at [facility], [address], [place], [place] [address]       Radiology Studies: CT Angio Chest Pulmonary Embolism (PE) W or WO Contrast  Addendum Date: 11/18/2020   ADDENDUM REPORT: 11/18/2020 13:20 ADDENDUM: The following was discussed in the body of the report but not included in the impression section: Scattered areas of mild peripheral ground-glass are likely infectious or inflammatory in etiology. Electronically Signed   By: Leanna Battles M.D.   On: 11/18/2020 13:20   Result Date: 11/18/2020 CLINICAL DATA:  Chest pain, shortness of breath, hypoxia. EXAM: CT ANGIOGRAPHY CHEST WITH CONTRAST TECHNIQUE: Multidetector CT imaging of the chest was performed using the standard protocol during bolus administration of intravenous  contrast. Multiplanar CT image reconstructions and MIPs were obtained to evaluate the vascular anatomy. CONTRAST:  82mL OMNIPAQUE IOHEXOL 350 MG/ML SOLN COMPARISON:  05/10/2019. FINDINGS: Cardiovascular: Negative for pulmonary embolus. Atherosclerotic calcification of the aorta and coronary arteries. Heart is enlarged. No pericardial effusion. Mediastinum/Nodes: Low-attenuation lesions in the thyroid measure up to 1.6 cm on the right. No pathologically enlarged mediastinal, hilar or axillary lymph nodes. Esophagus is grossly unremarkable. Lungs/Pleura: Image quality is degraded by expiratory phase imaging. There are areas of more focal peripheral patchy ground-glass bilaterally, not thought to be due to expiratory phase imaging. Calcified granulomas. Mild dependent atelectasis, right greater than left. No pleural fluid. Airway is unremarkable. Upper Abdomen: There may be a subcentimeter low-attenuation lesion in the dome of the liver, too small to characterize. Visualized portions of the liver, gallbladder, adrenal glands, right kidney, spleen and pancreas are otherwise unremarkable. Proximal gastric wall thickening. Upper abdominal lymph nodes are not enlarged by CT size criteria. Musculoskeletal: Degenerative changes in the spine. No worrisome lytic or sclerotic lesions. Review of the MIP images confirms the above findings. IMPRESSION: 1. Negative for pulmonary embolus. 2. 1.6 cm right thyroid nodule. Recommend thyroid ultrasound. (Ref: J Am Coll Radiol. 2015 Feb;12(2): 143-50). 3. Proximal gastric wall thickening can be seen with gastritis. 4. Aortic atherosclerosis (ICD10-I70.0). Coronary artery calcification. Electronically Signed: By: Leanna Battles M.D. On: 11/18/2020 13:08   DG Chest Port 1 View  Result Date: 11/17/2020 CLINICAL DATA:  Shortness of breath EXAM: PORTABLE CHEST 1 VIEW COMPARISON:  Chest radiograph 05/13/2019 FINDINGS: The heart is mildly enlarged, unchanged. The mediastinal contours are  stable. There is no focal consolidation or pulmonary edema. There is no pleural effusion or pneumothorax. There is no acute osseous abnormality. IMPRESSION: Stable cardiomegaly. Otherwise, no radiographic evidence of acute cardiopulmonary process. Electronically Signed   By: Lesia Hausen M.D.   On: 11/17/2020  16:31   DG Knee Complete 4 Views Right  Result Date: 11/17/2020 CLINICAL DATA:  Knee pain EXAM: RIGHT KNEE - COMPLETE 4+ VIEW COMPARISON:  None. FINDINGS: Small suprapatellar joint effusion. No acute fracture or dislocation identified. There is sharpening of the tibial spines. Patellofemoral and medial compartment joint space narrowing is identified. IMPRESSION: 1. Small suprapatellar joint effusion. 2. Patellofemoral and medial compartment osteoarthritis. Electronically Signed   By: Signa Kell M.D.   On: 11/17/2020 17:54   ECHOCARDIOGRAM COMPLETE  Result Date: 11/18/2020    ECHOCARDIOGRAM REPORT   Patient Name:   AMAZIN PINCOCK Ashkar Date of Exam: 11/18/2020 Medical Rec #:  [id number]    Height:       59.0 in Accession #:    [id number]   Weight:       190.0 lb Date of Birth:  1940-10-21   BSA:          1.805 m Patient Age:    79 years     BP:           163/66 mmHg Patient Gender: F            HR:           78 bpm. Exam Location:  Inpatient Procedure: 2D Echo, Cardiac Doppler and Color Doppler Indications:    R07.9* Chest pain, unspecified  History:        Patient has prior history of Echocardiogram examinations, most                 recent 05/13/2019. Pulmonary HTN and COPD; Risk                 Factors:Hypertension.  Sonographer:    MH Referring Phys: [id number] ARSHAD N KAKRAKANDY IMPRESSIONS  1. Left ventricular ejection fraction, by estimation, is 60 to 65%. The left ventricle has normal function. Left ventricular endocardial border not optimally defined to evaluate regional wall motion. Left ventricular diastolic parameters are consistent with Grade I diastolic dysfunction (impaired relaxation). Elevated left  ventricular end-diastolic pressure.  2. Right ventricular systolic function is normal. The right ventricular size is normal. There is normal pulmonary artery systolic pressure. The estimated right ventricular systolic pressure is 29.0 mmHg.  3. The pericardial effusion is posterior to the left ventricle.  4. The mitral valve is normal in structure. No evidence of mitral valve regurgitation. No evidence of mitral stenosis.  5. The aortic valve is normal in structure. Aortic valve regurgitation is not visualized. No aortic stenosis is present.  6. The inferior vena cava is normal in size with greater than 50% respiratory variability, suggesting right atrial pressure of 3 mmHg. FINDINGS  Left Ventricle: Left ventricular ejection fraction, by estimation, is 60 to 65%. The left ventricle has normal function. Left ventricular endocardial border not optimally defined to evaluate regional wall motion. The left ventricular internal cavity size was normal in size. There is no left ventricular hypertrophy. Left ventricular diastolic parameters are consistent with Grade I diastolic dysfunction (impaired relaxation). Elevated left ventricular end-diastolic pressure. Right Ventricle: The right ventricular size is normal. No increase in right ventricular wall thickness. Right ventricular systolic function is normal. There is normal pulmonary artery systolic pressure. The tricuspid regurgitant velocity is 2.55 m/s, and  with an assumed right atrial pressure of 3 mmHg, the estimated right ventricular systolic pressure is 29.0 mmHg. Left Atrium: Left atrial size was normal in size. Right Atrium: Right atrial size was normal in size. Pericardium: Trivial pericardial effusion is present. The  pericardial effusion is posterior to the left ventricle. Mitral Valve: The mitral valve is normal in structure. No evidence of mitral valve regurgitation. No evidence of mitral valve stenosis. Tricuspid Valve: The tricuspid valve is normal in  structure. Tricuspid valve regurgitation is trivial. No evidence of tricuspid stenosis. Aortic Valve: The aortic valve is normal in structure. Aortic valve regurgitation is not visualized. No aortic stenosis is present. Aortic valve mean gradient measures 8.0 mmHg. Aortic valve peak gradient measures 11.7 mmHg. Aortic valve area, by VTI measures 1.73 cm. Pulmonic Valve: The pulmonic valve was normal in structure. Pulmonic valve regurgitation is not visualized. No evidence of pulmonic stenosis. Aorta: The aortic root is normal in size and structure. Venous: The inferior vena cava is normal in size with greater than 50% respiratory variability, suggesting right atrial pressure of 3 mmHg. IAS/Shunts: The interatrial septum appears to be lipomatous. No atrial level shunt detected by color flow Doppler.  LEFT VENTRICLE PLAX 2D LVIDd:         3.90 cm     Diastology LVIDs:         2.80 cm     LV e' medial:    4.79 cm/s LV PW:         2.20 cm     LV E/e' medial:  16.5 LV IVS:        2.00 cm     LV e' lateral:   6.74 cm/s LVOT diam:     1.50 cm     LV E/e' lateral: 11.8 LV SV:         62 LV SV Index:   34 LVOT Area:     1.77 cm  LV Volumes (MOD) LV vol d, MOD A4C: 54.4 ml LV vol s, MOD A4C: 20.8 ml LV SV MOD A4C:     54.4 ml RIGHT VENTRICLE             IVC RV S prime:     10.70 cm/s  IVC diam: 1.20 cm TAPSE (M-mode): 2.6 cm LEFT ATRIUM             Index       RIGHT ATRIUM           Index LA diam:        3.10 cm 1.72 cm/m  RA Area:     11.10 cm LA Vol (A2C):   17.6 ml 9.75 ml/m  RA Volume:   21.20 ml  11.75 ml/m LA Vol (A4C):   30.4 ml 16.85 ml/m LA Biplane Vol: 24.2 ml 13.41 ml/m  AORTIC VALVE                    PULMONIC VALVE AV Area (Vmax):    1.66 cm     PV Vmax:       1.13 m/s AV Area (Vmean):   1.51 cm     PV Peak grad:  5.1 mmHg AV Area (VTI):     1.73 cm AV Vmax:           171.00 cm/s AV Vmean:          137.000 cm/s AV VTI:            0.357 m AV Peak Grad:      11.7 mmHg AV Mean Grad:      8.0 mmHg LVOT  Vmax:         161.00 cm/s LVOT Vmean:        117.000 cm/s LVOT VTI:  0.349 m LVOT/AV VTI ratio: 0.98  AORTA Ao Root diam: 2.80 cm Ao Asc diam:  2.95 cm MITRAL VALVE                TRICUSPID VALVE MV Area (PHT): 3.33 cm     TR Peak grad:   26.0 mmHg MV E velocity: 79.20 cm/s   TR Vmax:        255.00 cm/s MV A velocity: 115.00 cm/s MV E/A ratio:  0.69         SHUNTS                             Systemic VTI:  0.35 m                             Systemic Diam: 1.50 cm Armanda Magic MD Electronically signed by Armanda Magic MD Signature Date/Time: 11/18/2020/2:46:40 PM    Final    US THYROID  Result Date: 11/19/2020 CLINICAL DATA:  Nodule EXAM: THYROID ULTRASOUND TECHNIQUE: Ultrasound examination of the thyroid gland and adjacent soft tissues was performed. COMPARISON:  None. FINDINGS: Parenchymal Echotexture: Mildly heterogeneous Isthmus: 0.3 cm Right lobe: 2.3 x 1.6 x 1.6 cm Left lobe: 3.2 x 1.7 x 1.5 cm _________________________________________________________ Estimated total number of nodules >/= 1 cm: 1 Number of spongiform nodules >/=  2 cm not described below (TR1): 0 Number of mixed cystic and solid nodules >/= 1.5 cm not described below (TR2): 0 _________________________________________________________ 1.6 x 1.4 x 1.2 cm mixed solid cystic isoechoic nodule in the mid right thyroid lobe does not meet criteria for imaging surveillance or FNA. IMPRESSION: Solitary, mixed solid cystic, isoechoic right thyroid nodule does not meet criteria for FNA or imaging surveillance. The above is in keeping with the ACR TI-RADS recommendations - J Am Coll Radiol 2017;14:587-595. Electronically Signed   By: Acquanetta Belling M.D.   On: 11/19/2020 07:34   VAS [place] LOWER EXTREMITY VENOUS (DVT) (ONLY [facility] & [facility] 7a-7p)  Result Date: 11/17/2020  Lower Venous DVT Study Patient Name:  Virginia Summers  Date of Exam:   11/17/2020 Medical Rec #: [id number]     Accession #:    [id number] Date of Birth: February 24, 1941    Patient Gender: F Patient Age:    43 years Exam Location:  [facility] Procedure:      VAS [place] LOWER EXTREMITY VENOUS (DVT) Referring Phys: JOSHUA LONG --------------------------------------------------------------------------------  Indications: Pain, and Edema.  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 Limitations: Body habitus and poor ultrasound/tissue interface. Comparison Study: Previous exam 05/26/2010 Positive for DVT RLE - PTV & LLE - PTV                   and PopV Performing Technologist: Jody Hill RVT, RDMS  Examination Guidelines: A complete evaluation includes B-mode imaging, spectral Doppler, color Doppler, and power Doppler as needed of all accessible portions of each vessel. Bilateral testing is considered an integral part of a complete examination. Limited examinations for reoccurring indications may be performed as noted. The reflux portion of the exam is performed with the patient in reverse Trendelenburg.  +---------+---------------+---------+-----------+----------+-------------------+ RIGHT    CompressibilityPhasicitySpontaneityPropertiesThrombus Aging      +---------+---------------+---------+-----------+----------+-------------------+ CFV      Full           Yes      Yes                                      +---------+---------------+---------+-----------+----------+-------------------+  SFJ      Full                                                             +---------+---------------+---------+-----------+----------+-------------------+ FV Prox  Full           Yes      Yes                                      +---------+---------------+---------+-----------+----------+-------------------+ FV Mid   Full           Yes      Yes                                      +---------+---------------+---------+-----------+----------+-------------------+ FV DistalFull           Yes      Yes                                       +---------+---------------+---------+-----------+----------+-------------------+ PFV      Full                                                             +---------+---------------+---------+-----------+----------+-------------------+ POP      Full           Yes      Yes                                      +---------+---------------+---------+-----------+----------+-------------------+ PTV      Full                                         Not well visualized +---------+---------------+---------+-----------+----------+-------------------+ PERO     Full                                         Not well visualized +---------+---------------+---------+-----------+----------+-------------------+   +----+---------------+---------+-----------+----------+--------------+ LEFTCompressibilityPhasicitySpontaneityPropertiesThrombus Aging +----+---------------+---------+-----------+----------+--------------+ CFV Full           Yes      Yes                                 +----+---------------+---------+-----------+----------+--------------+     Summary: RIGHT: - There is no evidence of deep vein thrombosis in the lower extremity. - There is no evidence of superficial venous thrombosis.  - A cystic structure is found in the popliteal fossa. Subcutaneous edema in calf area.  LEFT: - No evidence of common femoral vein obstruction.  *See table(s) above for measurements and observations. Electronically signed by Faylene MillionVance  Brabham MD on 11/17/2020 at 7:18:44 PM.    Final      LOS: 0 days   Lanae Boast, MD [facility]  11/19/2020, 1:03 PM

## 2020-11-19 NOTE — Plan of Care (Signed)

## 2020-11-19 NOTE — Progress Notes (Signed)
Notified by RN that DBP is low with MAP of 60-62 with pulse in 50s  Has cramps in legs since had lasix in early afternoon.  Hasn't had electrolytes checked today.   Check BMP. Given LR 500 ml bolus at 250 ml/hr x 2 hours.  Monitor.  Hold evening antihypertensives

## 2020-11-19 NOTE — Progress Notes (Signed)
   Progress Note  Patient Name: [name] Date of Encounter: 11/19/2020  [facility] Cardiologist: Kristeen Miss, MD   Echocardiogram shows normal LV function and troponins not consistent with acute coronary syndrome.  As outlined previously would arrange outpatient Lexiscan nuclear study for risk stratification.  Continue present medications at discharge.  Please call with questions.  For questions or updates, please contact [facility] Please consult [email or web address] for contact info under        Signed, Olga Millers, MD  11/19/2020, 8:53 AM

## 2020-11-19 NOTE — Evaluation (Addendum)
Physical Therapy Evaluation Patient Details Name: Virginia Summers MRN: [id number] DOB: [birth year] Today's Date: 11/19/2020   History of Present Illness  Pt is 80 yo female admitted on 11/17/20 with shortness of breath and near syncope symptoms.  Pt with recent COVID.  Per notes pt has not been taking meds recently  and in HTN urgency - medication restarted and 1 dose lasix (note states will closely monitor BP).  CT Chest with scattered areas of groundglass -suspect related to COVID.  Pt with hx of HTN, PE, hypothyroidism, DM.  She also recently twisted her R leg  Clinical Impression  Pt admitted with above diagnosis. At baseline, pt resides alone and is fairly independent.  She typically does not use AD but has been using RW the last 3 weeks after twisting knee (imaging negative for fx, +joint effusion and arthritis).  She reports recent episodes of near syncope.  During evaluation today, pt initially transferring with min guard and ambulated 70' but during ambulation reports legs feeling weak and the felt diaphoretic and lightheaded.  Returned to room and BP was soft and HR 80's. Pt reports feeling palpitations and noted HR dropped from 80 to 60 when she felt these.  Symptoms initially improved and able to get standing BP but symptoms then returned.   Lightheadedness continued in sitting so reclined pt and BP continued to drop to as low as 97/37 with MAP 54 also noted HR now consistently 55-58 bpm and pt feeling palpitations.  Returned pt to bed and BP up to 114/41 but HR still 55-58 bpm.  Informed RN who is following up with MD.  Do expect pt to progress well as she is medically managed and vitals stabilize. Pt currently with functional limitations due to the deficits listed below (see PT Problem List). Pt will benefit from skilled PT to increase their independence and safety with mobility to allow discharge to the venue listed below.       Follow Up Recommendations Home health PT;Supervision -  Intermittent    Equipment Recommendations  None recommended by PT    Recommendations for Other Services       Precautions / Restrictions Precautions Precautions: Fall Precaution Comments: monitor HR (low) and orthostatic bp      Mobility  Bed Mobility Overal bed mobility: Needs Assistance Bed Mobility: Supine to Sit;Sit to Supine     Supine to sit: Min guard Sit to supine: Min guard        Transfers Overall transfer level: Needs assistance Equipment used: Rolling walker (2 wheeled) Transfers: Sit to/from Stand Sit to Stand: Min guard;Min assist         General transfer comment: Performed x 3 during session.  First 2 with min guard and 3rd with min A due to feeling lightheaded  Ambulation/Gait Ambulation/Gait assistance: Min guard Gait Distance (Feet): 70 Feet Assistive device: Rolling walker (2 wheeled) Gait Pattern/deviations: Step-to pattern;Decreased stride length;Decreased weight shift to right Gait velocity: decreased   General Gait Details: In last 66' pt reports lightheaded and was diaphoretic.  Returned to chair.  See general comments.  Prior to this was requiring min guard for safety and cues for RW proximity  Stairs            Wheelchair Mobility    Modified Rankin (Stroke Patients Only)       Balance Overall balance assessment: Needs assistance Sitting-balance support: No upper extremity supported Sitting balance-Leahy Scale: Good     Standing balance support: Bilateral upper extremity supported;Single  extremity supported Standing balance-Leahy Scale: Fair Standing balance comment: RW to ambulate but could static stand without AD                             Pertinent Vitals/Pain Pain Assessment: Faces Faces Pain Scale: Hurts little more Pain Location: R knee with transitions Pain Descriptors / Indicators: Discomfort Pain Intervention(s): Limited activity within patient's tolerance;Monitored during session;Repositioned     Home Living Family/patient expects to be discharged to:: Private residence Living Arrangements: Alone Available Help at Discharge: Family;Available PRN/intermittently Type of Home: House Home Access: Stairs to enter;Ramped entrance Entrance Stairs-Rails: Right Entrance Stairs-Number of Steps: 4 Home Layout: One level Home Equipment: Emergency planning/management officer - 2 wheels      Prior Function Level of Independence: Needs assistance   Gait / Transfers Assistance Needed: Ambulates without AD  ADL's / Homemaking Assistance Needed: Independent with ADLs and IADLs, does shopping online  Comments: Does not drive b/c double vision;- did twist R knee about 4 weeks ago and had to use walker for a little bit; 1 fall on steps but now has ramp     Hand Dominance   Dominant Hand: Right    Extremity/Trunk Assessment   Upper Extremity Assessment Upper Extremity Assessment:  (ROM WFL; MMT 5/5)    Lower Extremity Assessment Lower Extremity Assessment: LLE deficits/detail;RLE deficits/detail RLE Deficits / Details: ROM WFL; MMT ankle 5/5, knee and hip 3/5 not further tested LLE Deficits / Details: ROM WFL; MMT 5/5    Cervical / Trunk Assessment Cervical / Trunk Assessment: Normal  Communication   Communication: HOH  Cognition Arousal/Alertness: Awake/alert Behavior During Therapy: WFL for tasks assessed/performed;Anxious Overall Cognitive Status: Within Functional Limits for tasks assessed                                 General Comments: Pt anxious in regards to vitals (see general comments)      General Comments General comments (skin integrity, edema, etc.): Pt very pleasant and working with therapy.  Ambulated in hallway and pt reports lightheadedness/diaphoresis in last 20-30'.  Returned to chair and BP was 108/58 and HR 80's. Once symptoms calmed had pt stand and BP was 110/57.  After 3 mins, BP in standing 104/40 and symptoms increased. REturned to sitting. Pt reports  palpatations and noted HR dropped to 60 then back to 80's. BP sitting 99/49.  Reclined chair due to symptoms.  Pt still reports lightheaded.  HR now consistently 55-58 bpm and BP was 97/37 with Map 54.  Called for RN, tech into assist.  Assisted pt back to bed and BP 114/41 but HR still 55-58 bpm.  Pt reports symptoms improving but still feels palpatations. Pt keeps stating "I know whats going on, "  had her specify and she states "I'm not going to make it."  Reassured pt and answered questions within scope of practice.  Discussed HR a little low and BP low but improving in bed.  Discussed could be related to a lot of factors (dehydration, medication, in bed 3 days) but therapist is unable to determine exact cause and MD would need to further discuss.  Pt asked therapist to discuss with daughter - spoke with Clydie Braun on speaker phone about drop in bp and HR with OOB activity but that RN and MD would be informed, pt stable at this time.  Discussed concern of drop in  HR with RN who reports will f/u with MD.  Did note in MD notes that pt had not been taking BP meds at home and they were restarted and would need BP closely monitored.    In regards to O2 sats: Sats were 96% on 2 L rest, tried RA rest and sats 86%. Placed back on 2 L and sats 95% with activity.    Exercises     Assessment/Plan    PT Assessment Patient needs continued PT services  PT Problem List Decreased strength;Decreased mobility;Decreased activity tolerance;Cardiopulmonary status limiting activity;Decreased balance;Decreased knowledge of use of DME;Pain       PT Treatment Interventions DME instruction;Therapeutic activities;Gait training;Therapeutic exercise;Patient/family education;Balance training;Functional mobility training    PT Goals (Current goals can be found in the Care Plan section)  Acute Rehab PT Goals Patient Stated Goal: return home; find out what's going on PT Goal Formulation: With patient Time For Goal Achievement:  12/03/20 Potential to Achieve Goals: Good    Frequency Min 3X/week   Barriers to discharge        Co-evaluation               AM-PAC PT "6 Clicks" Mobility  Outcome Measure Help needed turning from your back to your side while in a flat bed without using bedrails?: A Little Help needed moving from lying on your back to sitting on the side of a flat bed without using bedrails?: A Little Help needed moving to and from a bed to a chair (including a wheelchair)?: A Little Help needed standing up from a chair using your arms (e.g., wheelchair or bedside chair)?: A Little Help needed to walk in hospital room?: A Little Help needed climbing 3-5 steps with a railing? : A Lot 6 Click Score: 17    End of Session Equipment Utilized During Treatment: Gait belt Activity Tolerance: Treatment limited secondary to medical complications (Comment) (low BP/HR) Patient left: in bed;with call bell/phone within reach;with bed alarm set Nurse Communication: Mobility status PT Visit Diagnosis: Other abnormalities of gait and mobility (R26.89);Muscle weakness (generalized) (M62.81);Pain Pain - Right/Left: Right Pain - part of body: Knee    Time: 1219-7588 PT Time Calculation (min) (ACUTE ONLY): 70 min   Charges:   PT Evaluation $PT Eval Moderate Complexity: 1 Mod PT Treatments $Gait Training: 8-22 mins $Therapeutic Activity: 23-37 mins        Anise Salvo, PT Acute Rehab Services Pager [phone] [facility] [phone]   Rayetta Humphrey 11/19/2020, 5:14 PM

## 2020-11-20 DIAGNOSIS — J9601 Acute respiratory failure with hypoxia: Secondary | ICD-10-CM | POA: Diagnosis not present

## 2020-11-20 LAB — BASIC METABOLIC PANEL
Anion gap: 6 (ref 5–15)
BUN: 42 mg/dL — ABNORMAL HIGH (ref 8–23)
CO2: 30 mmol/L (ref 22–32)
Calcium: 8.5 mg/dL — ABNORMAL LOW (ref 8.9–10.3)
Chloride: 93 mmol/L — ABNORMAL LOW (ref 98–111)
Creatinine, Ser: 1.83 mg/dL — ABNORMAL HIGH (ref 0.44–1.00)
GFR, Estimated: 28 mL/min — ABNORMAL LOW (ref >60)
Glucose, Bld: 205 mg/dL — ABNORMAL HIGH (ref 70–99)
Potassium: 4.5 mmol/L (ref 3.5–5.1)
Sodium: 129 mmol/L — ABNORMAL LOW (ref 135–145)

## 2020-11-20 LAB — GLUCOSE, CAPILLARY
Glucose-Capillary: 209 mg/dL — ABNORMAL HIGH (ref 70–99)
Glucose-Capillary: 249 mg/dL — ABNORMAL HIGH (ref 70–99)
Glucose-Capillary: 192 mg/dL — ABNORMAL HIGH (ref 70–99)
Glucose-Capillary: 182 mg/dL — ABNORMAL HIGH (ref 70–99)

## 2020-11-20 MED ORDER — INSULIN GLARGINE-YFGN 100 UNIT/ML ~~LOC~~ SOLN
4.0000 [IU] | Freq: Every day | SUBCUTANEOUS | Status: DC
Start: 2020-11-20 — End: 2020-11-23
  Administered 2020-11-20 – 2020-11-23 (×4): 4 [IU] via SUBCUTANEOUS
  Filled 2020-11-20 (×4): qty 0.04

## 2020-11-20 MED ORDER — SODIUM CHLORIDE 0.9 % IV SOLN: INTRAVENOUS | Status: DC

## 2020-11-20 MED ORDER — CARVEDILOL 6.25 MG PO TABS: 6.2500 mg | ORAL_TABLET | Freq: Two times a day (BID) | ORAL | Status: DC

## 2020-11-20 NOTE — Progress Notes (Signed)
PROGRESS NOTE    Virginia Summers  GNF:[id number] DOB: [birth year] DOA: 11/17/2020 PCP: [name], Megan A, FNP   Chief Complaint  Patient presents with   Shortness of Breath   Chest Pain    Brief Narrative:  80 year old female with history of hypertension, PE, hypothyroidism, diabetes presented with shortness of breath over the last 3 days 4 days, worse with exertion, has not been taking her medication for last 2 weeks due to running out of them.  Also complaining of right knee pain after she had abnormal movement when she twisted her leg about 3 weeks ago.  Recently diagnosed with COVID a month ago when she had a fever and upper respiratory tract symptoms for 10 days.  In the ED, found to have blood pressure more than 200 systolic.  Patient was afebrile.  Chest x-ray was unremarkable.  Since patient's lower extremity was slightly swollen Dopplers were done which were negative for PE.  Labs show creatinine 1.05 hemoglobin 9.1 BNP of 258 high sensitive troponins were 16 and 18.  COVID test was positive.  Patient was given Lasix 40 mg IV and IV hydralazine.  Following which blood pressure improved and patient admitted for further observation Cardiology was consulted.Serial troponin 16>18>18>17.  Blood pressure stabilizing resume home meds.Still on oxygen and weaning down.   Subjective: Orthostatic yesterday and overnight got 500 ml RL during night On 2l [place], moving air better Holding her blood pressure medication  Assessment & Plan:  Acute respiratory failure with hypoxemia  Chest pain Cough Recent COVID-19 infection Work-up with mildly positive troponin no delta, bnp mildly up at 258 from 216. chest x-ray with stable cardiomegaly.  echocardiogram shows normal EF, grade 1 diastolic dysfunction, cardiology does not feel need for any inpatient work-up and they will arrange for outpatient nuclear stress test.   CT angio chest no PE but has scattered areas of mild peripheral groundglass likely  infectious or inflammatory in etiology, suspect due to recent COVID.  Given Lasix IV x1.  Hold further diuretics given her hyponatremia and AKI.  Continue bronchodilators, antitussive, antibiotics oral prednisone.  Continue supplemental oxygen.  Symmetrical home oxygen.  Hypertension urgency: BP stabilized, and actually on softer side - hold ARB and chlorthalidone , Coreg dose decreased and hydralazine with holding parameters.  Cont ivf  Hyponatremia : Hold chlorthalidone and diuretics  Add IV fluids   AKI on CKD stage IIIa: Due to soft BP/dehydration/diuretic and AKI. Given bolus overnight, will continue on IV fluids next 24 hours repeat labs in the morning.  Hold her chlorthalidone and ARB as Recent Labs  Lab 11/17/20 1711 11/18/20 1116 11/20/20 0841  BUN 24* 22 42*  CREATININE 1.05* 1.03* 1.83*    Chest pressure likely from uncontrolled hypertension 2D echo normal EF, troponin flat cardiology input appreciated and plan for outpatient stress test.   UTI  w/ GNR: complaining of dysuria UA abnormal -  cont ciprofloxacin as penicillin allergy, pending further culture report continue current antibiotics  Right knee pain-x-ray showed osteoarthritis.    History of DVT/PE Dopplers negative for DVT.  On home xarelto  Chronic anemia likely from chronic kidney disease. hb is stable. Recent Labs  Lab 11/17/20 1553  HGB 11.1*  HCT 36.3    Hypomagnesia-repleted. HLD cont statin Hypothyroidism on Synthroid Diabetes mellitus type 2 in obese: Blood sugar on higher side added low-dose Lantus given patient on a steroid.  Monitor on sliding scale insulin.   Recent Labs  Lab 11/19/20 [phone] 11/19/20 1137 11/19/20 1607  11/19/20 2019 11/20/20 0746  GLUCAP 179* 170* 232* 205* 209*    Recent COVID-19 virus infection a month ago.  No active infection.  Patient likely has post-COVID effects.  Thyroid nodules: Ultrasound thyroid benign no need for biopsy.  Diet Order             Diet heart  healthy/carb modified Room service appropriate? Yes; Fluid consistency: Thin  Diet effective now                    Patient's Body mass index is 38.92 kg/m.  DVT prophylaxis: rivaroxaban (XARELTO) tablet 10 mg Start: 11/18/20 1000 Code Status:   Code Status: Full Code  Family Communication: plan of care discussed with patient at bedside. Status is: Observation Remains hospitalized for ongoing management Dispo: The patient is from: Home              Anticipated d/c is to: Home obtain PT OT evaluation              Patient currently is not medically stable to d/c.   Difficult to place patient No  Unresulted Labs (From admission, onward)    None      Medications reviewed:  Scheduled Meds:  atorvastatin  20 mg Oral Daily   carvedilol  6.25 mg Oral BID WC   ciprofloxacin  500 mg Oral BID   DULoxetine  30 mg Oral Daily   fenofibrate  160 mg Oral Daily   fluticasone  2 spray Each Nare Daily   hydrALAZINE  25 mg Oral TID   insulin aspart  0-9 Units Subcutaneous TID WC   insulin glargine-yfgn  4 Units Subcutaneous Daily   irbesartan  300 mg Oral Daily   levothyroxine  25 mcg Oral Q0600   magnesium oxide  400 mg Oral BID   melatonin  3 mg Oral QHS   mometasone-formoterol  2 puff Inhalation BID   predniSONE  20 mg Oral Q breakfast   rivaroxaban  10 mg Oral Daily   Continuous Infusions:  sodium chloride     Consultants:see note  Procedures:see note Antimicrobials: Anti-infectives (From admission, onward)    Start     Dose/Rate Route Frequency Ordered Stop   11/18/20 2100  ciprofloxacin (CIPRO) tablet 500 mg       Note to Pharmacy: UTI   500 mg Oral 2 times daily 11/18/20 2008 11/21/20 1959   11/18/20 0000  doxycycline (DORYX) 100 MG EC tablet        100 mg Oral 2 times daily 11/18/20 1410 11/25/20 2359      Culture/Microbiology    Component Value Date/Time   SDES URINE, CLEAN CATCH 11/18/2020 1637   SPECREQUEST NONE 11/18/2020 1637   CULT (A) 11/18/2020 1637     >=100,000 COLONIES/mL KLEBSIELLA PNEUMONIAE SUSCEPTIBILITIES TO FOLLOW Performed at [facility], [address] [place], [place] [address]    REPTSTATUS PENDING 11/18/2020 1637    Other culture-see note  Objective: Vitals: Today's Vitals   11/19/20 2357 11/20/20 0300 11/20/20 0706 11/20/20 0815  BP: (!) 137/45 (!) 111/45    Pulse: 73 74    Resp: 17 12    Temp:  98 F (36.7 C)    TempSrc:  Oral    SpO2:  96%  96%  Weight:      PainSc:   0-No pain     Intake/Output Summary (Last 24 hours) at 11/20/2020 1130 Last data filed at 11/20/2020 0800 Gross per 24 hour  Intake 501.34 ml  Output 1000 ml  Net -498.66 ml   Filed Weights   11/19/20 0500  Weight: 87.4 kg   Weight change:   Intake/Output from previous day: 09/08 0701 - 09/09 0700 In: 501.3 [IV Piggyback:501.3] Out: 500 [Urine:500] Intake/Output this shift: Total I/O In: -  Out: 500 [Urine:500] Filed Weights   11/19/20 0500  Weight: 87.4 kg    Examination: General exam: AAOx 3, pleasant, not in distress, on 2 L nasal cannula  HEENT:Oral mucosa moist, Ear/Nose WNL grossly, dentition normal. Respiratory system: bilaterally air entry present mild expiratory wheezing, no use of accessory muscle Cardiovascular system: S1 & S2 +, No JVD,. Gastrointestinal system: Abdomen soft, NT,ND, BS+ Nervous System:Alert, awake, moving extremities and grossly nonfocal Extremities: no edema, distal peripheral pulses palpable.  Skin: No rashes,no icterus. MSK: Normal muscle bulk,tone, power   Data Reviewed: I have personally reviewed following labs and imaging studies CBC: Recent Labs  Lab 11/17/20 1553  WBC 9.0  NEUTROABS 5.6  HGB 11.1*  HCT 36.3  MCV 85.4  PLT 293   Basic Metabolic Panel: Recent Labs  Lab 11/17/20 1711 11/18/20 1116 11/20/20 0841  NA 138 137 129*  K 4.8 4.0 4.5  CL 109 99 93*  CO2 GLUCOSE 153* 172* 205*  BUN 24* 22 42*  CREATININE 1.05* 1.03* 1.83*  CALCIUM 8.0* 9.3 8.5*   MG  --  1.5*  --    GFR: CrCl cannot be calculated (Unknown ideal weight.). Liver Function Tests: Recent Labs  Lab 11/17/20 1635  AST 25  ALT 11  ALKPHOS 49  BILITOT 0.3  PROT 6.0*  ALBUMIN 2.9*   Recent Labs  Lab 11/17/20 1635  LIPASE 34   No results for input(s): AMMONIA in the last 168 hours. Coagulation Profile: No results for input(s): INR, PROTIME in the last 168 hours. Cardiac Enzymes: No results for input(s): CKTOTAL, CKMB, CKMBINDEX, TROPONINI in the last 168 hours. BNP (last 3 results) No results for input(s): PROBNP in the last 8760 hours. HbA1C: Recent Labs    11/18/20 1116  HGBA1C 8.7*   CBG: Recent Labs  Lab 11/19/20 0738 11/19/20 1137 11/19/20 1607 11/19/20 2019 11/20/20 0746  GLUCAP 179* 170* 232* 205* 209*   Lipid Profile: Recent Labs    11/18/20 1116  CHOL 231*  HDL 35*  LDLCALC 153*  TRIG 214*  CHOLHDL 6.6   Thyroid Function Tests: No results for input(s): TSH, T4TOTAL, FREET4, T3FREE, THYROIDAB in the last 72 hours. Anemia Panel: No results for input(s): VITAMINB12, FOLATE, FERRITIN, TIBC, IRON, RETICCTPCT in the last 72 hours. Sepsis Labs: No results for input(s): PROCALCITON, LATICACIDVEN in the last 168 hours.  Recent Results (from the past 240 hour(s))  Resp Panel by RT-PCR (Flu A&B, Covid) Nasopharyngeal Swab     Status: Abnormal   Collection Time: 11/17/20  4:05 PM   Specimen: Nasopharyngeal Swab; Nasopharyngeal(NP) swabs in vial transport medium  Result Value Ref Range Status   SARS Coronavirus 2 by RT PCR POSITIVE (A) NEGATIVE Final    Comment: RESULT CALLED TO, READ BACK BY AND VERIFIED WITH: [place] NICKERSON RN 11/17/20  BY JW (NOTE) SARS-CoV-2 target nucleic acids are DETECTED.  The SARS-CoV-2 RNA is generally detectable in upper respiratory specimens during the acute phase of infection. Positive results are indicative of the presence of the identified virus, but do not rule out bacterial infection or  co-infection with other pathogens not detected by the test. Clinical correlation with patient history and  other diagnostic information is necessary to determine patient infection status. The expected result is Negative.  Fact Sheet for Patients: BloggerCourse.com  Fact Sheet for Healthcare Providers: SeriousBroker.it  This test is not yet approved or cleared by the [place] FDA and  has been authorized for detection and/or diagnosis of SARS-CoV-2 by FDA under an Emergency Use Authorization (EUA).  This EUA will remain in effect (meaning this test c an be used) for the duration of  the COVID-19 declaration under Section 564(b)(1) of the Act, 21 U.S.C. section 360bbb-3(b)(1), unless the authorization is terminated or revoked sooner.     Influenza A by PCR NEGATIVE NEGATIVE Final   Influenza B by PCR NEGATIVE NEGATIVE Final    Comment: (NOTE) The Xpert Xpress SARS-CoV-2/FLU/RSV plus assay is intended as an aid in the diagnosis of influenza from Nasopharyngeal swab specimens and should not be used as a sole basis for treatment. Nasal washings and aspirates are unacceptable for Xpert Xpress SARS-CoV-2/FLU/RSV testing.  Fact Sheet for Patients: BloggerCourse.com  Fact Sheet for Healthcare Providers: SeriousBroker.it  This test is not yet approved or cleared by the [place] FDA and has been authorized for detection and/or diagnosis of SARS-CoV-2 by FDA under an Emergency Use Authorization (EUA). This EUA will remain in effect (meaning this test can be used) for the duration of the COVID-19 declaration under Section 564(b)(1) of the Act, 21 U.S.C. section 360bbb-3(b)(1), unless the authorization is terminated or revoked.  Performed at [facility], [address], [place], [place] [address]   Urine Culture     Status: Abnormal (Preliminary result)   Collection Time:  11/18/20  4:37 PM   Specimen: Urine, Clean Catch  Result Value Ref Range Status   Specimen Description URINE, CLEAN CATCH  Final   Special Requests NONE  Final   Culture (A)  Final    >=100,000 COLONIES/mL KLEBSIELLA PNEUMONIAE SUSCEPTIBILITIES TO FOLLOW Performed at [facility], [address], [place], [place] [address]    Report Status PENDING  Incomplete      Radiology Studies: CT Angio Chest Pulmonary Embolism (PE) W or WO Contrast  Addendum Date: 11/18/2020   ADDENDUM REPORT: 11/18/2020 13:20 ADDENDUM: The following was discussed in the body of the report but not included in the impression section: Scattered areas of mild peripheral ground-glass are likely infectious or inflammatory in etiology. Electronically Signed   By: Leanna Battles M.D.   On: 11/18/2020 13:20   Result Date: 11/18/2020 CLINICAL DATA:  Chest pain, shortness of breath, hypoxia. EXAM: CT ANGIOGRAPHY CHEST WITH CONTRAST TECHNIQUE: Multidetector CT imaging of the chest was performed using the standard protocol during bolus administration of intravenous contrast. Multiplanar CT image reconstructions and MIPs were obtained to evaluate the vascular anatomy. CONTRAST:  65mL OMNIPAQUE IOHEXOL 350 MG/ML SOLN COMPARISON:  05/10/2019. FINDINGS: Cardiovascular: Negative for pulmonary embolus. Atherosclerotic calcification of the aorta and coronary arteries. Heart is enlarged. No pericardial effusion. Mediastinum/Nodes: Low-attenuation lesions in the thyroid measure up to 1.6 cm on the right. No pathologically enlarged mediastinal, hilar or axillary lymph nodes. Esophagus is grossly unremarkable. Lungs/Pleura: Image quality is degraded by expiratory phase imaging. There are areas of more focal peripheral patchy ground-glass bilaterally, not thought to be due to expiratory phase imaging. Calcified granulomas. Mild dependent atelectasis, right greater than left. No pleural fluid. Airway is unremarkable. Upper Abdomen: There may be  a subcentimeter low-attenuation lesion in the dome of the liver, too small to characterize. Visualized portions of the liver, gallbladder,  adrenal glands, right kidney, spleen and pancreas are otherwise unremarkable. Proximal gastric wall thickening. Upper abdominal lymph nodes are not enlarged by CT size criteria. Musculoskeletal: Degenerative changes in the spine. No worrisome lytic or sclerotic lesions. Review of the MIP images confirms the above findings. IMPRESSION: 1. Negative for pulmonary embolus. 2. 1.6 cm right thyroid nodule. Recommend thyroid ultrasound. (Ref: J Am Coll Radiol. 2015 Feb;12(2): 143-50). 3. Proximal gastric wall thickening can be seen with gastritis. 4. Aortic atherosclerosis (ICD10-I70.0). Coronary artery calcification. Electronically Signed: By: Leanna Battles M.D. On: 11/18/2020 13:08   ECHOCARDIOGRAM COMPLETE  Result Date: 11/18/2020    ECHOCARDIOGRAM REPORT   Patient Name:   Virginia Summers Date of Exam: 11/18/2020 Medical Rec #:  [id number]    Height:       59.0 in Accession #:    [id number]   Weight:       190.0 lb Date of Birth:  1940/05/19   BSA:          1.805 m Patient Age:    79 years     BP:           163/66 mmHg Patient Gender: F            HR:           78 bpm. Exam Location:  Inpatient Procedure: 2D Echo, Cardiac Doppler and Color Doppler Indications:    R07.9* Chest pain, unspecified  History:        Patient has prior history of Echocardiogram examinations, most                 recent 05/13/2019. Pulmonary HTN and COPD; Risk                 Factors:Hypertension.  Sonographer:    MH Referring Phys: [id number] ARSHAD N KAKRAKANDY IMPRESSIONS  1. Left ventricular ejection fraction, by estimation, is 60 to 65%. The left ventricle has normal function. Left ventricular endocardial border not optimally defined to evaluate regional wall motion. Left ventricular diastolic parameters are consistent with Grade I diastolic dysfunction (impaired relaxation). Elevated left ventricular  end-diastolic pressure.  2. Right ventricular systolic function is normal. The right ventricular size is normal. There is normal pulmonary artery systolic pressure. The estimated right ventricular systolic pressure is 29.0 mmHg.  3. The pericardial effusion is posterior to the left ventricle.  4. The mitral valve is normal in structure. No evidence of mitral valve regurgitation. No evidence of mitral stenosis.  5. The aortic valve is normal in structure. Aortic valve regurgitation is not visualized. No aortic stenosis is present.  6. The inferior vena cava is normal in size with greater than 50% respiratory variability, suggesting right atrial pressure of 3 mmHg. FINDINGS  Left Ventricle: Left ventricular ejection fraction, by estimation, is 60 to 65%. The left ventricle has normal function. Left ventricular endocardial border not optimally defined to evaluate regional wall motion. The left ventricular internal cavity size was normal in size. There is no left ventricular hypertrophy. Left ventricular diastolic parameters are consistent with Grade I diastolic dysfunction (impaired relaxation). Elevated left ventricular end-diastolic pressure. Right Ventricle: The right ventricular size is normal. No increase in right ventricular wall thickness. Right ventricular systolic function is normal. There is normal pulmonary artery systolic pressure. The tricuspid regurgitant velocity is 2.55 m/s, and  with an assumed right atrial pressure of 3 mmHg, the estimated right ventricular systolic pressure is 29.0 mmHg. Left Atrium: Left atrial size was normal in size. Right  Atrium: Right atrial size was normal in size. Pericardium: Trivial pericardial effusion is present. The pericardial effusion is posterior to the left ventricle. Mitral Valve: The mitral valve is normal in structure. No evidence of mitral valve regurgitation. No evidence of mitral valve stenosis. Tricuspid Valve: The tricuspid valve is normal in structure.  Tricuspid valve regurgitation is trivial. No evidence of tricuspid stenosis. Aortic Valve: The aortic valve is normal in structure. Aortic valve regurgitation is not visualized. No aortic stenosis is present. Aortic valve mean gradient measures 8.0 mmHg. Aortic valve peak gradient measures 11.7 mmHg. Aortic valve area, by VTI measures 1.73 cm. Pulmonic Valve: The pulmonic valve was normal in structure. Pulmonic valve regurgitation is not visualized. No evidence of pulmonic stenosis. Aorta: The aortic root is normal in size and structure. Venous: The inferior vena cava is normal in size with greater than 50% respiratory variability, suggesting right atrial pressure of 3 mmHg. IAS/Shunts: The interatrial septum appears to be lipomatous. No atrial level shunt detected by color flow Doppler.  LEFT VENTRICLE PLAX 2D LVIDd:         3.90 cm     Diastology LVIDs:         2.80 cm     LV e' medial:    4.79 cm/s LV PW:         2.20 cm     LV E/e' medial:  16.5 LV IVS:        2.00 cm     LV e' lateral:   6.74 cm/s LVOT diam:     1.50 cm     LV E/e' lateral: 11.8 LV SV:         62 LV SV Index:   34 LVOT Area:     1.77 cm  LV Volumes (MOD) LV vol d, MOD A4C: 54.4 ml LV vol s, MOD A4C: 20.8 ml LV SV MOD A4C:     54.4 ml RIGHT VENTRICLE             IVC RV S prime:     10.70 cm/s  IVC diam: 1.20 cm TAPSE (M-mode): 2.6 cm LEFT ATRIUM             Index       RIGHT ATRIUM           Index LA diam:        3.10 cm 1.72 cm/m  RA Area:     11.10 cm LA Vol (A2C):   17.6 ml 9.75 ml/m  RA Volume:   21.20 ml  11.75 ml/m LA Vol (A4C):   30.4 ml 16.85 ml/m LA Biplane Vol: 24.2 ml 13.41 ml/m  AORTIC VALVE                    PULMONIC VALVE AV Area (Vmax):    1.66 cm     PV Vmax:       1.13 m/s AV Area (Vmean):   1.51 cm     PV Peak grad:  5.1 mmHg AV Area (VTI):     1.73 cm AV Vmax:           171.00 cm/s AV Vmean:          137.000 cm/s AV VTI:            0.357 m AV Peak Grad:      11.7 mmHg AV Mean Grad:      8.0 mmHg LVOT Vmax:  161.00 cm/s LVOT Vmean:        117.000 cm/s LVOT VTI:          0.349 m LVOT/AV VTI ratio: 0.98  AORTA Ao Root diam: 2.80 cm Ao Asc diam:  2.95 cm MITRAL VALVE                TRICUSPID VALVE MV Area (PHT): 3.33 cm     TR Peak grad:   26.0 mmHg MV E velocity: 79.20 cm/s   TR Vmax:        255.00 cm/s MV A velocity: 115.00 cm/s MV E/A ratio:  0.69         SHUNTS                             Systemic VTI:  0.35 m                             Systemic Diam: 1.50 cm Armanda Magic MD Electronically signed by Armanda Magic MD Signature Date/Time: 11/18/2020/2:46:40 PM    Final    US THYROID  Result Date: 11/19/2020 CLINICAL DATA:  Nodule EXAM: THYROID ULTRASOUND TECHNIQUE: Ultrasound examination of the thyroid gland and adjacent soft tissues was performed. COMPARISON:  None. FINDINGS: Parenchymal Echotexture: Mildly heterogeneous Isthmus: 0.3 cm Right lobe: 2.3 x 1.6 x 1.6 cm Left lobe: 3.2 x 1.7 x 1.5 cm _________________________________________________________ Estimated total number of nodules >/= 1 cm: 1 Number of spongiform nodules >/=  2 cm not described below (TR1): 0 Number of mixed cystic and solid nodules >/= 1.5 cm not described below (TR2): 0 _________________________________________________________ 1.6 x 1.4 x 1.2 cm mixed solid cystic isoechoic nodule in the mid right thyroid lobe does not meet criteria for imaging surveillance or FNA. IMPRESSION: Solitary, mixed solid cystic, isoechoic right thyroid nodule does not meet criteria for FNA or imaging surveillance. The above is in keeping with the ACR TI-RADS recommendations - J Am Coll Radiol 2017;14:587-595. Electronically Signed   By: Acquanetta Belling M.D.   On: 11/19/2020 07:34     LOS: 1 day   Lanae Boast, MD [facility]  11/20/2020, 11:30 AM

## 2020-11-20 NOTE — Progress Notes (Signed)
Inpatient Diabetes Program Recommendations  AACE/ADA: New Consensus Statement on Inpatient Glycemic Control (2015)  Target Ranges:  Prepandial:   less than 140 mg/dL      Peak postprandial:   less than 180 mg/dL (1-2 hours)      Critically ill patients:  140 - 180 mg/dL   Lab Results  Component Value Date   GLUCAP 209 (H) 11/20/2020   HGBA1C 8.7 (H) 11/18/2020    Review of Glycemic Control Results for Virginia Summers, Virginia Summers (MRN [id number]) as of 11/20/2020 10:56  Ref. Range 11/19/2020 07:38 11/19/2020 11:37 11/19/2020 16:07 11/19/2020 20:19 11/20/2020 07:46  Glucose-Capillary Latest Ref Range: 70 - 99 mg/dL 412 (H) 878 (H) 676 (H) 205 (H) 209 (H)   Diabetes history: DM 2 Outpatient Diabetes medications: Metformin 500 mg bid Current orders for Inpatient glycemic control:  Novolog sensitive tid with meals Semglee 4 units daily Prednisone 20 mg q AM Inpatient Diabetes Program Recommendations:   May consider increasing Semglee to 10 units daily while on steroids.   Will follow.   Thanks,  Beryl Meager, RN, BC-ADM Inpatient Diabetes Coordinator Pager [phone]  (8a-5p)

## 2020-11-20 NOTE — Evaluation (Signed)
Occupational Therapy Evaluation Patient Details Name: Virginia Summers MRN: [id number] DOB: December 03, 1940 Today's Date: 11/20/2020    History of Present Illness Pt is 80 yo female admitted on 11/17/20 with shortness of breath and near syncope symptoms.  Pt with recent COVID.  Per notes pt has not been taking meds recently  and in HTN urgency - medication restarted and 1 dose lasix (note states will closely monitor BP).  CT Chest with scattered areas of groundglass -suspect related to COVID.  Pt with hx of HTN, PE, hypothyroidism, DM.  She also recently twisted her R leg   Clinical Impression   Patient admitted for the diagnosis above.  PTA she lives alone with check-ins from her daughter.  She walked with a RW, but remained fairly independent with ADL/IADL with increased time and effort.  Deficits impacting independence are listed below.  Currently she is needing up to min a for basic mobility at RW level, and up to Mod A for lower body ADL.  OT to follow in the acute setting to maximize her functional status, and HH is recommended for a safe transition home.      Follow Up Recommendations  Home health OT    Equipment Recommendations  Tub/shower seat    Recommendations for Other Services       Precautions / Restrictions Precautions Precautions: Fall Precaution Comments: monitor HR (low) and orthostatic bp.  with x-large cuff remained low 102/33.  changed to adult cuff and was 123/69.  Remains symptomatic with dizziness. Restrictions Weight Bearing Restrictions: No      Mobility Bed Mobility Overal bed mobility: Needs Assistance Bed Mobility: Supine to Sit     Supine to sit: Min assist;HOB elevated       Patient Response: Anxious  Transfers Overall transfer level: Needs assistance Equipment used: Rolling walker (2 wheeled) Transfers: Sit to/from [employer] Sit to Stand: Min guard;Min assist Stand pivot transfers: Min assist            Balance Overall  balance assessment: Needs assistance Sitting-balance support: No upper extremity supported Sitting balance-Leahy Scale: Good     Standing balance support: Bilateral upper extremity supported;Single extremity supported Standing balance-Leahy Scale: Fair Standing balance comment: RW to ambulate but could static stand without AD for stand grooming with seat behind her                           ADL either performed or assessed with clinical judgement   ADL Overall ADL's : Needs assistance/impaired Eating/Feeding: Independent;Sitting   Grooming: Wash/dry hands;Wash/dry face;Oral care;Min guard;Standing   Upper Body Bathing: Sitting;Min guard;Standing   Lower Body Bathing: Minimal assistance;Sitting/lateral leans   Upper Body Dressing : Sitting;Standing;Min guard   Lower Body Dressing: Moderate assistance;Sitting/lateral leans Lower Body Dressing Details (indicate cue type and reason): R knee discomfort Toilet Transfer: Minimal assistance;RW;BSC   Toileting- Clothing Manipulation and Hygiene: Min guard;Sit to/from stand       Functional mobility during ADLs: Minimal assistance;Rolling walker General ADL Comments: remains dizzy     Vision Patient Visual Report: No change from baseline       Perception     Praxis      Pertinent Vitals/Pain Pain Assessment: Faces Faces Pain Scale: Hurts little more Pain Location: R knee with transitions Pain Descriptors / Indicators: Discomfort;Tender Pain Intervention(s): Monitored during session     Hand Dominance Right   Extremity/Trunk Assessment Upper Extremity Assessment Upper Extremity Assessment: Generalized weakness  Lower Extremity Assessment Lower Extremity Assessment: Defer to PT evaluation   Cervical / Trunk Assessment Cervical / Trunk Assessment: Normal   Communication Communication Communication: HOH   Cognition Arousal/Alertness: Awake/alert Behavior During Therapy: WFL for tasks  assessed/performed;Anxious Overall Cognitive Status: Within Functional Limits for tasks assessed                                     General Comments       Exercises     Shoulder Instructions      Home Living Family/patient expects to be discharged to:: Private residence Living Arrangements: Alone Available Help at Discharge: Family;Available PRN/intermittently Type of Home: House Home Access: Stairs to enter;Ramped entrance Entrance Stairs-Number of Steps: 4 Entrance Stairs-Rails: Right Home Layout: One level     Bathroom Shower/Tub: Chief Strategy Officer: Standard     Home Equipment: Emergency planning/management officer - 2 wheels          Prior Functioning/Environment Level of Independence: Needs assistance  Gait / Transfers Assistance Needed: Ambulates without AD ADL's / Homemaking Assistance Needed: Independent with ADLs and IADLs, does shopping online            OT Problem List: Decreased strength;Decreased activity tolerance;Impaired balance (sitting and/or standing)      OT Treatment/Interventions: Self-care/ADL training;DME and/or AE instruction;Balance training;Therapeutic activities    OT Goals(Current goals can be found in the care plan section) Acute Rehab OT Goals Patient Stated Goal: Get up and start moving OT Goal Formulation: With patient Time For Goal Achievement: 12/04/20 Potential to Achieve Goals: Good ADL Goals Pt Will Perform Grooming: with set-up;sitting;standing Pt Will Perform Upper Body Bathing: with set-up;sitting;standing Pt Will Perform Lower Body Bathing: with set-up;sit to/from stand Pt Will Perform Upper Body Dressing: with set-up;standing;sitting Pt Will Transfer to Toilet: with modified independence;ambulating;regular height toilet Pt Will Perform Toileting - Clothing Manipulation and hygiene: with modified independence;sit to/from stand;sitting/lateral leans  OT Frequency: Min 2X/week   Barriers to D/C:     none noted       Co-evaluation              AM-PAC OT "6 Clicks" Daily Activity     Outcome Measure Help from another person eating meals?: None Help from another person taking care of personal grooming?: A Little Help from another person toileting, which includes using toliet, bedpan, or urinal?: A Little Help from another person bathing (including washing, rinsing, drying)?: A Lot Help from another person to put on and taking off regular upper body clothing?: A Little Help from another person to put on and taking off regular lower body clothing?: A Lot 6 Click Score: 17   End of Session Equipment Utilized During Treatment: Gait belt;Rolling walker;Oxygen Nurse Communication: Mobility status  Activity Tolerance: Patient tolerated treatment well Patient left: in chair;with call bell/phone within reach;with family/visitor present  OT Visit Diagnosis: Unsteadiness on feet (R26.81);Muscle weakness (generalized) (M62.81);Dizziness and giddiness (R42)                Time: 1100-1130 OT Time Calculation (min): 30 min Charges:  OT General Charges $OT Visit: 1 Visit OT Evaluation $OT Eval Moderate Complexity: 1 Mod OT Treatments $Self Care/Home Management : 8-22 mins  11/20/2020  RP, OTR/L  Acute Rehabilitation Services  Office:  [phone]   Suzanna Obey 11/20/2020, 12:15 PM

## 2020-11-20 NOTE — Plan of Care (Signed)

## 2020-11-20 NOTE — TOC Initial Note (Signed)
Transition of Care [facility]) - Initial/Assessment Note    Patient Details  Name: Virginia Summers MRN: [id number] Date of Birth: 07-Sep-1940  Transition of Care [facility]) CM/SW Contact:    Lorri Frederick, LCSW Phone Number: 11/20/2020, 10:18 AM  Clinical Narrative:  CSW spoke with pt about recommendation for [facility].  Pt agreeable, choice document given, [facility] chosen.  Permission given to speak with daughter Clydie Braun.  PCP in place.  Current DME in home: walker, shower chair.  Pt is not vaccinated for covid.  Stacie at [facility] accepts for [facility].                  Expected Discharge Plan: Home w Home Health Services Barriers to Discharge: Continued Medical Work up   Patient Goals and CMS Choice   CMS Medicare.gov Compare Post Acute Care list provided to:: Patient Choice offered to / list presented to : Patient  Expected Discharge Plan and Services Expected Discharge Plan: Home w Home Health Services     Post Acute Care Choice: Home Health Living arrangements for the past 2 months: Single Family Home                           HH Arranged: PT, OT HH Agency: [facility] Date [facility] Agency Contacted: 11/20/20 Time HH Agency Contacted: [phone] Representative spoke with at [facility] Agency: Stacie  Prior Living Arrangements/Services Living arrangements for the past 2 months: Single Family Home Lives with:: Self Patient language and need for interpreter reviewed:: Yes Do you feel safe going back to the place where you live?: Yes      Need for Family Participation in Patient Care: No (Comment) Care giver support system in place?: Yes (comment) Current home services: Other (comment) (na) Criminal Activity/Legal Involvement Pertinent to Current Situation/Hospitalization: No - Comment as needed  Activities of Daily Living Home Assistive Devices/Equipment: None ADL Screening (condition at time of admission) Patient's cognitive ability adequate to safely complete daily activities?: Yes Is  the patient deaf or have difficulty hearing?: Yes Does the patient have difficulty seeing, even when wearing glasses/contacts?: Yes Does the patient have difficulty concentrating, remembering, or making decisions?: Yes Patient able to express need for assistance with ADLs?: Yes Does the patient have difficulty dressing or bathing?: No Independently performs ADLs?: Yes (appropriate for developmental age) Does the patient have difficulty walking or climbing stairs?: No Weakness of Legs: Both Weakness of Arms/Hands: None  Permission Sought/Granted Permission sought to share information with : Family Supports Permission granted to share information with : Yes, Verbal Permission Granted  Share Information with NAME: daughter Clydie Braun  Permission granted to share info w AGENCY: HH        Emotional Assessment Appearance:: Appears stated age Attitude/Demeanor/Rapport: Engaged Affect (typically observed): Appropriate, Pleasant Orientation: : Oriented to Self, Oriented to Place, Oriented to  Time, Oriented to Situation Alcohol / Substance Use: Not Applicable Psych Involvement: No (comment)  Admission diagnosis:  Precordial chest pain [R07.2] Elevated blood pressure reading [R03.0] Acute respiratory failure with hypoxia (HCC) [J96.01] Acute respiratory failure with hypoxemia (HCC) [J96.01] Respiratory failure (HCC) [J96.90] Patient Active Problem List   Diagnosis Date Noted   Respiratory failure (HCC) 11/19/2020   Acute respiratory failure with hypoxemia (HCC) 11/17/2020   Hypertensive urgency 11/17/2020   Diabetes mellitus type 2 in obese (HCC) 11/17/2020   Acute respiratory failure with hypoxia (HCC) 11/17/2020   COVID-19 virus infection 11/17/2020   Precordial chest pain  Palpitations 04/17/2020   Morbid obesity (HCC) 06/04/2019   COPD with acute exacerbation (HCC) 05/10/2019   History of pulmonary embolus (PE) 05/10/2019   Hiatal hernia 05/12/2011   Hypothyroidism 08/10/2010    Cramp 06/09/2010   Pulmonary HTN (HCC) 06/09/2010   Pulmonary embolism (HCC)    HTN (hypertension)    Anticoagulant long-term use    PCP:  Eather Colas, FNP Pharmacy:   [facility] [address] ([address] [place] - [address] [place] ([place]) [place] [address] Phone: [phone] Fax: [phone]     Social Determinants of Health (SDOH) Interventions    Readmission Risk Interventions No flowsheet data found.

## 2020-11-21 ENCOUNTER — Inpatient Hospital Stay (HOSPITAL_COMMUNITY): Payer: Medicare HMO

## 2020-11-21 ENCOUNTER — Other Ambulatory Visit: Payer: Self-pay | Admitting: Internal Medicine

## 2020-11-21 DIAGNOSIS — R001 Bradycardia, unspecified: Secondary | ICD-10-CM | POA: Diagnosis not present

## 2020-11-21 DIAGNOSIS — J9601 Acute respiratory failure with hypoxia: Secondary | ICD-10-CM | POA: Diagnosis not present

## 2020-11-21 LAB — URINE CULTURE: Culture: >=100000 — AB

## 2020-11-21 LAB — BASIC METABOLIC PANEL
Anion gap: 10 (ref 5–15)
Anion gap: 8 (ref 5–15)
BUN: 61 mg/dL — ABNORMAL HIGH (ref 8–23)
BUN: 57 mg/dL — ABNORMAL HIGH (ref 8–23)
CO2: 25 mmol/L (ref 22–32)
CO2: 24 mmol/L (ref 22–32)
Calcium: 8.4 mg/dL — ABNORMAL LOW (ref 8.9–10.3)
Calcium: 8.4 mg/dL — ABNORMAL LOW (ref 8.9–10.3)
Chloride: 95 mmol/L — ABNORMAL LOW (ref 98–111)
Chloride: 97 mmol/L — ABNORMAL LOW (ref 98–111)
Creatinine, Ser: 2.84 mg/dL — ABNORMAL HIGH (ref 0.44–1.00)
Creatinine, Ser: 1.94 mg/dL — ABNORMAL HIGH (ref 0.44–1.00)
GFR, Estimated: 16 mL/min — ABNORMAL LOW (ref >60)
GFR, Estimated: 26 mL/min — ABNORMAL LOW (ref >60)
Glucose, Bld: 152 mg/dL — ABNORMAL HIGH (ref 70–99)
Glucose, Bld: 239 mg/dL — ABNORMAL HIGH (ref 70–99)
Potassium: 5.4 mmol/L — ABNORMAL HIGH (ref 3.5–5.1)
Potassium: 5.8 mmol/L — ABNORMAL HIGH (ref 3.5–5.1)
Sodium: 130 mmol/L — ABNORMAL LOW (ref 135–145)
Sodium: 129 mmol/L — ABNORMAL LOW (ref 135–145)

## 2020-11-21 LAB — HEPATIC FUNCTION PANEL
ALT: 14 U/L (ref 0–44)
AST: 22 U/L (ref 15–41)
Albumin: 3.1 g/dL — ABNORMAL LOW (ref 3.5–5.0)
Alkaline Phosphatase: 41 U/L (ref 38–126)
Bilirubin, Direct: 0.2 mg/dL (ref 0.0–0.2)
Indirect Bilirubin: 0.2 mg/dL — ABNORMAL LOW (ref 0.3–0.9)
Total Bilirubin: 0.4 mg/dL (ref 0.3–1.2)
Total Protein: 6 g/dL — ABNORMAL LOW (ref 6.5–8.1)

## 2020-11-21 LAB — CBC
HCT: 31.6 % — ABNORMAL LOW (ref 36.0–46.0)
Hemoglobin: 9.6 g/dL — ABNORMAL LOW (ref 12.0–15.0)
MCH: 26.2 pg (ref 26.0–34.0)
MCHC: 30.4 g/dL (ref 30.0–36.0)
MCV: 86.1 fL (ref 80.0–100.0)
Platelets: 246 10*3/uL (ref 150–400)
RBC: 3.67 MIL/uL — ABNORMAL LOW (ref 3.87–5.11)
RDW: 14.5 % (ref 11.5–15.5)
WBC: 8.1 10*3/uL (ref 4.0–10.5)
nRBC: 0 % (ref 0.0–0.2)

## 2020-11-21 LAB — GLUCOSE, CAPILLARY
Glucose-Capillary: 161 mg/dL — ABNORMAL HIGH (ref 70–99)
Glucose-Capillary: 209 mg/dL — ABNORMAL HIGH (ref 70–99)
Glucose-Capillary: 227 mg/dL — ABNORMAL HIGH (ref 70–99)
Glucose-Capillary: 179 mg/dL — ABNORMAL HIGH (ref 70–99)

## 2020-11-21 LAB — TSH: TSH: 3.92 u[IU]/mL (ref 0.350–4.500)

## 2020-11-21 LAB — C-REACTIVE PROTEIN: CRP: 0.6 mg/dL (ref <1.0)

## 2020-11-21 MED ORDER — SODIUM CHLORIDE 0.9 % IV SOLN: INTRAVENOUS | Status: DC

## 2020-11-21 MED ORDER — HYDRALAZINE HCL 25 MG PO TABS
25.0000 mg | ORAL_TABLET | Freq: Three times a day (TID) | ORAL | Status: DC
  Administered 2020-11-21 – 2020-11-22 (×3): 25 mg via ORAL
  Filled 2020-11-21 (×3): qty 1

## 2020-11-21 MED ORDER — CIPROFLOXACIN HCL 250 MG PO TABS
250.0000 mg | ORAL_TABLET | Freq: Every day | ORAL | Status: AC
  Administered 2020-11-21: 250 mg via ORAL
  Filled 2020-11-21: qty 1

## 2020-11-21 MED ORDER — SODIUM POLYSTYRENE SULFONATE 15 GM/60ML PO SUSP
45.0000 g | ORAL | Status: AC
  Administered 2020-11-21: 45 g via ORAL
  Filled 2020-11-21 (×2): qty 180

## 2020-11-21 MED ORDER — SODIUM ZIRCONIUM CYCLOSILICATE 10 G PO PACK
10.0000 g | PACK | Freq: Once | ORAL | Status: AC
  Administered 2020-11-21: 10 g via ORAL
  Filled 2020-11-21: qty 1

## 2020-11-21 MED ORDER — PREDNISONE 10 MG PO TABS
10.0000 mg | ORAL_TABLET | Freq: Every day | ORAL | Status: AC
  Administered 2020-11-22 – 2020-11-23 (×2): 10 mg via ORAL
  Filled 2020-11-21 (×2): qty 1

## 2020-11-21 NOTE — Plan of Care (Signed)
  Problem: Education: Goal: Knowledge of General Education information will improve Description: Including pain rating scale, medication(s)/side effects and non-pharmacologic comfort measures Outcome: Progressing   Problem: Health Behavior/Discharge Planning: Goal: Ability to manage health-related needs will improve Outcome: Progressing   Problem: Clinical Measurements: Goal: Will remain free from infection Outcome: Progressing Goal: Diagnostic test results will improve Outcome: Progressing Goal: Respiratory complications will improve Outcome: Progressing   Problem: Activity: Goal: Risk for activity intolerance will decrease Outcome: Progressing   Problem: Nutrition: Goal: Adequate nutrition will be maintained Outcome: Progressing   Problem: Coping: Goal: Level of anxiety will decrease Outcome: Progressing   Problem: Elimination: Goal: Will not experience complications related to bowel motility Outcome: Progressing Goal: Will not experience complications related to urinary retention Outcome: Progressing   Problem: Pain Managment: Goal: General experience of comfort will improve Outcome: Progressing   Problem: Safety: Goal: Ability to remain free from injury will improve Outcome: Progressing   Problem: Skin Integrity: Goal: Risk for impaired skin integrity will decrease Outcome: Progressing   Problem: Clinical Measurements: Goal: Ability to maintain clinical measurements within normal limits will improve Outcome: Not Progressing Goal: Cardiovascular complication will be avoided Outcome: Not Progressing

## 2020-11-21 NOTE — Consult Note (Signed)
Renal Service Consult Note [name]  Virginia Summers 11/21/2020 Virginia Blazing, MD Requesting Physician: Dr Virginia Summers  Reason for Consult: Renal failure HPI: The patient is a 80 y.o. year-old w/ hx of DM2, HTN, PE and CKD 3a presented w/ SOB. Pt had recent admit for COVID infection and is still testing positive. Pt was admitted and dx'd w/ HTN'sive urgency and was given hydralazine and IV lasix, also home Coreg, ARB and hctz were continued. CTA chest was done on 9/7 which was neg for PE.  Creat was at baseline on admit 1.03, then went up to 1.8 and then to 2.84 this am. ARB was dc'd yesterday and no further lasix was given w/ rising creatinine. IVF"s were given and creat down today at 1.94.  UOP has been good all along about 500 cc - 1000 cc per day. Asked to see for renal failure.   Pt seen in room. States no c/o's today, no sig SOB, leg swelling. No hx CHF or heart problems. +hx of asthma.     ROS - denies CP, no joint pain, no HA, no blurry vision, no rash, no diarrhea, no nausea/ vomiting, no dysuria, no difficulty voiding   Past Medical History  Past Medical History:  Diagnosis Date   Anticoagulant long-term use    Chest pain    Diabetes mellitus    Diverticulitis    DVT (deep venous thrombosis) ([place])    BILATERAL, March 2012   GERD (gastroesophageal reflux disease)    HTN (hypertension)    Hypothyroidism    Pulmonary embolism ([place])    Past Surgical History  Past Surgical History:  Procedure Laterality Date   APPENDECTOMY  AGE 66   BACK SURGERY  2000   DISC   PARTIAL HYSTERECTOMY  AGE 73's APPROX.   SECONDARY INTRAOCULAR LENSE IMPLANTATION  2005,2006   OU   SHOULDER SURGERY  5-6 YRS OLD   PINS   Family History  Family History  Problem Relation Age of Onset   Transient ischemic attack Father    Coronary artery disease Father    Stroke Mother    Diabetes Mother    Diabetes Sister    Hypertension Daughter    Social History  reports that she quit  smoking about 17 years ago. Her smoking use included cigarettes. She has a 15.00 pack-year smoking history. She has never used smokeless tobacco. She reports that she does not drink alcohol and does not use drugs. Allergies  Allergies  Allergen Reactions   Penicillins Swelling and Rash    Did it involve swelling of the face/tongue/throat, SOB, or low BP? No Did it involve sudden or severe rash/hives, skin peeling, or any reaction on the inside of your mouth or nose? No Did you need to seek medical attention at a hospital or doctor's office? No When did it last happen?    childhood   If all above answers are "NO", may proceed with cephalosporin use.   Sulfa Drugs Cross Reactors Swelling and Rash   Home medications Prior to Admission medications   Medication Sig Start Date End Date Taking? Authorizing Provider  acetaminophen (TYLENOL) 500 MG tablet Take 1,000 mg by mouth every 6 (six) hours as needed for moderate pain or headache.   Yes [provider]  albuterol (VENTOLIN HFA) 108 (90 Base) MCG/ACT inhaler Inhale 2 puffs into the lungs every 4 (four) hours as needed. For shortness of breath.   Yes [provider]  Artificial Tear Ointment (DRY  EYES OP) Apply 1 drop to eye 2 (two) times daily as needed (dry eyes).   Yes [provider]  atorvastatin (LIPITOR) 20 MG tablet Take 20 mg by mouth daily. 05/29/19  Yes [provider]  budesonide-formoterol (SYMBICORT) 80-4.5 MCG/ACT inhaler Inhale 2 puffs into the lungs 2 (two) times daily.   Yes [provider]  carvedilol (COREG) 12.5 MG tablet Take 1 tablet (12.5 mg total) by mouth 2 (two) times daily with a meal. 04/13/20  Yes Nahser, Deloris Ping, MD  chlorthalidone (HYGROTON) 25 MG tablet Take 1 tablet by mouth once daily Patient taking differently: Take 25 mg by mouth daily. 07/20/20  Yes Nahser, Deloris Ping, MD  citalopram (CELEXA) 20 MG tablet Take 20 mg by mouth daily.  04/08/15  Yes [provider]   doxycycline (DORYX) 100 MG EC tablet Take 1 tablet (100 mg total) by mouth 2 (two) times daily for 7 days. 11/18/20 11/25/20 Yes Virginia Boast, MD  DULoxetine (CYMBALTA) 60 MG capsule Take 60 mg by mouth daily. 05/30/19  Yes [provider]  fenofibrate (TRICOR) 145 MG tablet Take 1 tablet (145 mg total) by mouth daily. 04/20/20  Yes Nahser, Deloris Ping, MD  fluticasone [facility]) 50 MCG/ACT nasal spray Place 2 sprays into both nostrils daily as needed for allergies or rhinitis. 09/26/18  Yes [provider]  hydrALAZINE (APRESOLINE) 25 MG tablet TAKE 1 TABLET BY MOUTH THREE TIMES DAILY Patient taking differently: Take 25 mg by mouth 3 (three) times daily. 09/09/20  Yes Nahser, Deloris Ping, MD  levothyroxine (SYNTHROID, LEVOTHROID) 25 MCG tablet TAKE 1 TABLET (25 MCG TOTAL) BY MOUTH DAILY. Patient taking differently: Take 25 mcg by mouth daily before breakfast. 03/08/13  Yes Nahser, Deloris Ping, MD  metFORMIN (GLUCOPHAGE-XR) 500 MG 24 hr tablet Take 500 mg by mouth 2 (two) times daily before a meal.   Yes [provider]  Multiple Vitamins-Minerals (AIRBORNE PO) Take 1 tablet by mouth daily as needed (cold symtoms).   Yes [provider]  OVER THE COUNTER MEDICATION Take 1 tablet by mouth daily. beets   Yes [provider]  potassium chloride (KLOR-CON) 10 MEQ tablet Take 1 tablet by mouth once daily Patient taking differently: Take 10 mEq by mouth daily. 05/11/20  Yes Nahser, Deloris Ping, MD  traZODone (DESYREL) 50 MG tablet Take 50 mg by mouth at bedtime as needed for sleep.  09/18/17  Yes [provider]  valsartan (DIOVAN) 320 MG tablet Take 1 tablet by mouth once daily Patient taking differently: Take 320 mg by mouth daily. 05/11/20  Yes Nahser, Deloris Ping, MD  XARELTO 10 MG TABS tablet Take 1 tablet by mouth once daily Patient taking differently: Take 10 mg by mouth daily. 06/22/20  Yes Nahser, Deloris Ping, MD  predniSONE (DELTASONE) 1 MG tablet Take 2 tablets by mouth  as directed. take 2 tablets by mouth once daily for 4 weeks, then 1 tablet once daily for 4 weeks,, then 1/2 tablet once for 4 weeks, then stop Patient not taking: No sig reported 05/29/19   [provider]     Vitals:   11/21/20 1527 11/21/20 1529 11/21/20 1553 11/21/20 1636  BP: (!) 216/33 (!) 181/33  (!) 164/37  Pulse: (!) 45 (!) 48  (!) 51  Resp: 18 (!) 21  15  Temp: 99.1 F (37.3 C) 99.1 F (37.3 C)  98.1 F (36.7 C)  TempSrc:      SpO2:   97%   Weight:  Height:       Exam Gen alert, no distress No rash, cyanosis or gangrene Sclera anicteric, throat clear  No jvd or bruits Chest clear bilat to bases, no rales/ wheezing RRR no MRG Abd soft ntnd no mass or ascites +bs GU normal MS no joint effusions or deformity Ext no LE or UE edema, no wounds or ulcers Neuro is alert, Ox 3 , nf         Home meds include lipitor, symbicort, coreg 12.5 bid, hygroton, celexa, cymbalta, tricor, flonase, hydralazine 25 tid, synthroid, metformin, klor-con, trazodone, valsartan 300 qd, xarelto 10 qd, prednisone taper, prn's     Date    Creat  eGFR   2012- 2014  0.9- 1.09   2018   0.95- 1.12 48-52, CKD 3a   Feb-march 2021 0.95- 1.52 32-57   11/17/20  1.05  54, CKD 3a   9/7   1.03   9/9   1.83  28   11/21/20  2.84, 1.94 16,26     UA 11/18/20 - turbid, many bact, 0-5 rbc, prot 30, >50 wbc    Na 129 K 5.8  CO2 24  BUN 57  Cr 1.94   Ca 8.4  Alb 3.1  LFT"s ok    WBC 8.1  Hb 9.6      ECHO 9/07 - LVEF 60-65%, G1DD, ^'d lvedp, normal PA pressures, posterior pericardial effusion, no sig valve issues    CXR 9/6 - IMPRESSION: Stable cardiomegaly. Otherwise, no radiographic evidence of acute cardiopulmonary process.    Lasix 40 mg IV on 9/6, 9/8     Irbesartan 300 mg 9/7- 9/9 dc'd     IV contrast 64 ml 9/07 w/ CTA chest  Assessment/ Plan: AKI on CKD 3a - b/l creat 0.95 - 1.10, eGFR 48- 52.  Creat here was at baseline on admission.  Suspect AKI due to contrast exposure +/- vol  depletion/diuretics +/-  ARB effects.  Lasix/ ARB stopped and getting IVF"s and creat improving this afternoon.  Will lower IVF to 100 cc/hr, suspect will continue to improve.  HTN - on 3 bp meds at home including ARB. Holding ARB for now. Would not worry about tight control of her BP at this time in an 80 yo w/ > 20 yrs of HTN.  SOB - not sure cause, but doesn't appear to have CHF.   Klebsiella UTI - per pmd Recent COVID infection - from 4 wks ago, still testing + here.  H/o PE - on a/c w/ xarelto      Kelly Splinter  MD 11/21/2020, 6:58 PM  Recent Labs  Lab 11/17/20 1553 11/21/20 0150  WBC 9.0 8.1  HGB 11.1* 9.6*   Recent Labs  Lab 11/21/20 0150 11/21/20 1528  K 5.4* 5.8*  BUN 61* 57*  CREATININE 2.84* 1.94*  CALCIUM 8.4* 8.4*

## 2020-11-21 NOTE — Progress Notes (Signed)
Progress Note  Patient Name: Virginia Summers Date of Encounter: 11/21/2020  [facility] Cardiologist: Kristeen Miss, MD   Subjective   No CP or dyspnea  Inpatient Medications    Scheduled Meds:  atorvastatin  20 mg Oral Daily   ciprofloxacin  250 mg Oral Q breakfast   DULoxetine  30 mg Oral Daily   fenofibrate  160 mg Oral Daily   fluticasone  2 spray Each Nare Daily   insulin aspart  0-9 Units Subcutaneous TID WC   insulin glargine-yfgn  4 Units Subcutaneous Daily   levothyroxine  25 mcg Oral Q0600   melatonin  3 mg Oral QHS   mometasone-formoterol  2 puff Inhalation BID   predniSONE  20 mg Oral Q breakfast   rivaroxaban  10 mg Oral Daily   sodium zirconium cyclosilicate  10 g Oral Once   Continuous Infusions:  sodium chloride     PRN Meds: acetaminophen **OR** acetaminophen, albuterol, guaiFENesin, ipratropium-albuterol, ondansetron (ZOFRAN) IV, traMADol, traZODone   Vital Signs    Vitals:   11/20/20 2037 11/20/20 2050 11/21/20 0329 11/21/20 0400  BP:  (!) 120/48 (!) 135/43   Pulse:  (!) 42 (!) 41   Resp:  15 16   Temp:  (!) 97.3 F (36.3 C) (!) 97 F (36.1 C)   TempSrc:  Oral Axillary   SpO2: 97%  99%   Weight:      Height:    4\' 11"  (1.499 m)    Intake/Output Summary (Last 24 hours) at 11/21/2020 0832 Last data filed at 11/21/2020 0000 Gross per 24 hour  Intake 1362.54 ml  Output --  Net 1362.54 ml   Last 3 Weights 11/19/2020 04/17/2020 06/04/2019  Weight (lbs) 192 lb 10.9 oz 190 lb 196 lb 12 oz  Weight (kg) 87.4 kg 86.183 kg 89.245 kg      Telemetry    Junctional bradycardia- Personally Reviewed  ECG    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   Neck: No JVD Cardiac: Regular and bradycardic Respiratory: Clear to auscultation bilaterally. GI: Soft, nontender, non-distended  MS: No edema Neuro:  Nonfocal  Psych: Normal affect   Labs    High  Sensitivity Troponin:   Recent Labs  Lab 11/17/20 1711 11/17/20 1943 11/17/20 2159 11/17/20 2347  TROPONINIHS 16 18* 18* 17      Chemistry Recent Labs  Lab 11/17/20 1635 11/17/20 1711 11/18/20 1116 11/20/20 0841 11/21/20 0150  NA  --    < > 137 129* 130*  K  --    < > 4.0 4.5 5.4*  CL  --    < > 99 93* 95*  CO2  --    < > 28 30 25   GLUCOSE  --    < > 172* 205* 152*  BUN  --    < > 22 42* 61*  CREATININE  --    < > 1.03* 1.83* 2.84*  CALCIUM  --    < > 9.3 8.5* 8.4*  PROT 6.0*  --   --   --   --   ALBUMIN 2.9*  --   --   --   --   AST 25  --   --   --   --   ALT 11  --   --   --   --   ALKPHOS 49  --   --   --   --  BILITOT 0.3  --   --   --   --   GFRNONAA  --    < > 55* 28* 16*  ANIONGAP  --    < > 10 6 10    < > = values in this interval not displayed.     Hematology Recent Labs  Lab 11/17/20 1553 11/21/20 0150  WBC 9.0 8.1  RBC 4.25 3.67*  HGB 11.1* 9.6*  HCT 36.3 31.6*  MCV 85.4 86.1  MCH 26.1 26.2  MCHC 30.6 30.4  RDW 14.3 14.5  PLT 293 246    BNP Recent Labs  Lab 11/17/20 1635  BNP 258.7*      Patient Profile     80 year old female with past medical history of hypertension, previous pulmonary embolus on Xarelto, hypothyroidism, diabetes mellitus, hyperlipidemia for evaluation of chest pain and palpitations.  Patient did have a cardiac catheterization in 2008 revealing normal coronary arteries.  Echocardiogram shows normal LV function, grade 1 diastolic dysfunction and trivial pericardial effusion.  Lower extremity venous Doppler showed no DVT.  Assessment & Plan    1 junctional bradycardia-ask to reassess due to development of bradycardia.  Patient has developed a junctional rhythm.  I agree with holding carvedilol.  Follow on telemetry.  If does not improve would likely need pacemaker.  Note LV function normal on recent echocardiogram.  2 chest pain-as outlined previously symptoms were extremely atypical.  Troponins not consistent with acute  coronary syndrome.  Echocardiogram showed normal LV function.  Plan outpatient nuclear study for risk stratification when she improves.     3 acute kidney disease-renal function worse compared to admission.  Appears to be over diuresed.  Would not diurese further.  She is being gently hydrated.  4 blood pressure is normal.  All medications are on hold.  We will follow.  5 UTI-on ciprofloxacin.   For questions or updates, please contact [facility] Please consult [email or web address] for contact info under        Signed, 2009, MD  11/21/2020, 8:32 AM

## 2020-11-21 NOTE — Progress Notes (Signed)
PROGRESS NOTE    Virginia Summers  ZOX:[id number] DOB: 02-04-1941 DOA: 11/17/2020 PCP: [name], Megan A, FNP   Chief Complaint  Patient presents with   Shortness of Breath   Chest Pain    Brief Narrative:  80 year old female with history of hypertension, PE, hypothyroidism, diabetes presented with shortness of breath over the last 3 days 4 days, worse with exertion, has not been taking her medication for last 2 weeks due to running out of them.  Also complaining of right knee pain after she had abnormal movement when she twisted her leg about 3 weeks ago.  Recently diagnosed with COVID a month ago when she had a fever and upper respiratory tract symptoms for 10 days.  In the ED, found to have blood pressure more than 200 systolic.  Patient was afebrile.  Chest x-ray was unremarkable.  Since patient's lower extremity was slightly swollen Dopplers were done which were negative for PE.  Labs show creatinine 1.05 hemoglobin 9.1 BNP of 258 high sensitive troponins were 16 and 18.  COVID test was positive.  Patient was given Lasix 40 mg IV and IV hydralazine.  Following which blood pressure improved and patient admitted for further observation Cardiology was consulted.Serial troponin 16>18>18>17.  Blood pressure stabilizing resume home meds.Still on oxygen and weaning down.  Subjective: Seen this am at 8 am. Overnight HR in 40s, BP soft , has been on iv nss Feels mildy sick, no vomiting O2 down to 2l   Doesn't feel well overall Followed up at afternoon-per RN has voided 850 ml. Bp trending up Came and examined again at 5 pm spoke w/ Clydie BraunKaren at bedside.Bp trending down in 160s sbp after po hydralazine. Repeat bmp with creat trending down and k up at 5.8  Assessment & Plan:  Acute respiratory failure with hypoxemia  Cough Recent COVID-19 infection a month ago COPD bnp mildly up at 258 from 216. chest x-ray with stable cardiomegaly.  echocardiogram shows normal EF, G1DD.  Patient described her  chest pain similar to her previous episode when she had PE-CT angio was done that showed no PE but scattered areas of mild peripheral groundglass likely infectious or inflammatory in etiology, suspect due to recent COVID.  Tried a dose of Lasix to help with oxygenation 9/8.  Continue supplemental oxygen bronchodilators antitussives and oral steroid-short course.  Chest pain: Work-up with mildly positive troponin no delta,echocardiogram shows normal EF, G1DD.Seen by cards- and planning outpatient nuclear stress test.  Hypertension urgency in ED: BP had stabilized-and has been soft and w/ bradycardia and her meds were discontinued-continue to hold her ARB, chlorthalidone in the light of AKI and hold Coreg due to bradycardia. Resume hydralinze 25 mg q8 hr given BP is uptrending.  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  Cardiology notified beta-blocker remains on hold if does not improve may need pacemaker per cardiology.  Hyponatremia: Likely from diuretics and chlorthalidone, sodium improving with IV fluids.  Diuretics on hold   AKI on CKD stage IIIa: AKI likely multifactorial in the setting of uncontrolled hypertension with background diabetes, multiple meds with chlorthalidone/ARB at home also received dose of Lasix, IV contrast for CTA, w/ G1DD .Patient  received bolus fluids 9/8 and on ivf.  Increase fluids to 150 mill per hour given decreased urine output, monitor strict intake output had incontinent episode this morning.  Check urine ultrasound repeat UA, nephrology has been consulted we will keep on low potassium diet.  Recent Labs  Lab 11/17/20  1711 11/18/20 1116 11/20/20 0841 11/21/20 0150  BUN 24* 22 42* 61*  CREATININE 1.05* 1.03* 1.83* 2.84*    Intake/Output Summary (Last 24 hours) at 11/21/2020 1348 Last data filed at 11/21/2020 0830 Gross per 24 hour  Intake 1482.54 ml  Output --  Net 1482.54 ml    Hyperkalemia: Due to AKI.  We will dose Lokelma  x1, change diet to renal diet-low potassium diet monitor BMP later today.  Discussed with nephrology. Repeat bmp in afternoon. Recheck bmp trending up at 5.8- spoke w/ Dr Arlean Hopping- advised kayexalate 45 mg x1. Recent Labs  Lab 11/17/20 1711 11/18/20 1116 11/20/20 0841 11/21/20 0150  K 4.8 4.0 4.5 5.4*    Klebsiella pneumoniaeUTI : complaining of dysuria UA abnormal -ciprofloxacin x3 days ordered has penicillin allergy.  Culture sensitivity pending- cont to follow C/S to guide therapy.   Right knee pain-x-ray showed osteoarthritis.    History of DVT/PE Dopplers negative for DVT.  On home xarelto  Chronic anemia likely from chronic kidney disease. hb is down-trending,monitor. Recent Labs  Lab 11/17/20 1553 11/21/20 0150  HGB 11.1* 9.6*  HCT 36.3 31.6*     Hypomagnesemia-repleted. HLD cont statin Hypothyroidism on Synthroid Diabetes mellitus type 2 in obese: Blood sugar on higher side added low-dose Lantus , cont ssi, cont to monitor. Recent Labs  Lab 11/20/20 0746 11/20/20 1202 11/20/20 1544 11/20/20 2126 11/21/20 0758  GLUCAP 209* 249* 192* 182* 161*   Thyroid nodules in CT: Ultrasound thyroid benign no need for biopsy.  Class II obesity with BMI 38.9: Will benefit with weight loss PCP follow-up healthy lifestyle  Diet Order             Diet renal/carb modified with fluid restriction Diet-HS Snack? Nothing; Room service appropriate? Yes; Fluid consistency: Thin  Diet effective now                    Patient's Body mass index is 38.92 kg/m.  DVT prophylaxis: rivaroxaban (XARELTO) tablet 10 mg Start: 11/18/20 1000 Code Status:   Code Status: Full Code  Family Communication: plan of care discussed with patient at bedside.  Called her daughter Clydie Braun -no answer then called another daughter Karoline Caldwell and discussed and updated her plan in detail.  Transfer to progressive care unit  given her bradycardia AKI hypoxia.  Status is: inpatient Remains hospitalized for  ongoing management Dispo: The patient is from: Home              Anticipated d/c is to: Home cont PT OT evaluation              Patient currently is not medically stable to d/c.   Difficult to place patient No  Unresulted Labs (From admission, onward)     Start     Ordered   11/21/20 0500  Basic metabolic panel  Daily,   R     Question:  Specimen collection method  Answer:  Lab=Lab collect   11/20/20 1134   11/21/20 0500  CBC  Daily,   R     Question:  Specimen collection method  Answer:  Lab=Lab collect   11/20/20 1134           Medications reviewed:  Scheduled Meds:  atorvastatin  20 mg Oral Daily   ciprofloxacin  250 mg Oral Q breakfast   DULoxetine  30 mg Oral Daily   fenofibrate  160 mg Oral Daily   fluticasone  2 spray Each Nare Daily   insulin aspart  0-9 Units Subcutaneous TID WC   insulin glargine-yfgn  4 Units Subcutaneous Daily   levothyroxine  25 mcg Oral Q0600   melatonin  3 mg Oral QHS   mometasone-formoterol  2 puff Inhalation BID   predniSONE  20 mg Oral Q breakfast   rivaroxaban  10 mg Oral Daily   sodium zirconium cyclosilicate  10 g Oral Once   Continuous Infusions:  sodium chloride     Consultants:see note  Procedures:see note Antimicrobials: Anti-infectives (From admission, onward)    Start     Dose/Rate Route Frequency Ordered Stop   11/21/20 0434  ciprofloxacin (CIPRO) tablet 250 mg       Note to Pharmacy: UTI   250 mg Oral Daily with breakfast 11/21/20 0435 11/22/20 0659   11/18/20 2100  ciprofloxacin (CIPRO) tablet 500 mg  Status:  Discontinued       Note to Pharmacy: UTI   500 mg Oral 2 times daily 11/18/20 2008 11/21/20 0435   11/18/20 0000  doxycycline (DORYX) 100 MG EC tablet        100 mg Oral 2 times daily 11/18/20 1410 11/25/20 2359      Culture/Microbiology    Component Value Date/Time   SDES URINE, CLEAN CATCH 11/18/2020 1637   SPECREQUEST NONE 11/18/2020 1637   CULT (A) 11/18/2020 1637    >=100,000 COLONIES/mL KLEBSIELLA  PNEUMONIAE SUSCEPTIBILITIES TO FOLLOW Performed at [facility], [address] [place], [place] [address]    REPTSTATUS PENDING 11/18/2020 1637    Other culture-see note  Objective: Vitals: Today's Vitals   11/20/20 2037 11/20/20 2050 11/21/20 0329 11/21/20 0400  BP:  (!) 120/48 (!) 135/43   Pulse:  (!) 42 (!) 41   Resp:  15 16   Temp:  (!) 97.3 F (36.3 C) (!) 97 F (36.1 C)   TempSrc:  Oral Axillary   SpO2: 97%  99%   Weight:      Height:     (1.499 m)  PainSc:  0-No pain      Intake/Output Summary (Last 24 hours) at 11/21/2020 0802 Last data filed at 11/21/2020 0000 Gross per 24 hour  Intake 1362.54 ml  Output --  Net 1362.54 ml    Filed Weights   11/19/20 0500  Weight: 87.4 kg   Weight change:   Intake/Output from previous day: 09/09 0701 - 09/10 0700 In: 1362.5 [I.V.:1362.5] Out: 500 [Urine:500] Intake/Output this shift: No intake/output data recorded. Filed Weights   11/19/20 0500  Weight: 87.4 kg    Examination: General exam: AAO, obese, not in distress.   HEENT:Oral mucosa moist, Ear/Nose WNL grossly, dentition normal. Respiratory system: bilaterally clear with diminished at the bases no added sounds,no use of accessory muscle. Cardiovascular system: S1 & S2 +, No JVD. Gastrointestinal system: Abdomen soft,NT,ND, BS+. Nervous System:Alert, awake, moving extremities and grossly nonfocal. Extremities: no edema, distal peripheral pulses palpable.  Skin: No rashes,no icterus. MSK: Normal muscle bulk,tone, power.     Data Reviewed: I have personally reviewed following labs and imaging studies CBC: Recent Labs  Lab 11/17/20 1553 11/21/20 0150  WBC 9.0 8.1  NEUTROABS 5.6  --   HGB 11.1* 9.6*  HCT 36.3 31.6*  MCV 85.4 86.1  PLT 293 246    Basic Metabolic Panel: Recent Labs  Lab 11/17/20 1711 11/18/20 1116 11/20/20 0841 11/21/20 0150  NA 138 137 129* 130*  K 4.8 4.0 4.5 5.4*  CL 109 99 93* 95*  CO2 25  GLUCOSE  153* 172* 205* 152*  BUN 24* 22 42* 61*  CREATININE 1.05* 1.03* 1.83* 2.84*  CALCIUM 8.0* 9.3 8.5* 8.4*  MG  --  1.5*  --   --     GFR: Estimated Creatinine Clearance: 15.2 mL/min (A) (by C-G formula based on SCr of 2.84 mg/dL (H)). Liver Function Tests: Recent Labs  Lab 11/17/20 1635  AST 25  ALT 11  ALKPHOS 49  BILITOT 0.3  PROT 6.0*  ALBUMIN 2.9*    Recent Labs  Lab 11/17/20 1635  LIPASE 34    No results for input(s): AMMONIA in the last 168 hours. Coagulation Profile: No results for input(s): INR, PROTIME in the last 168 hours. Cardiac Enzymes: No results for input(s): CKTOTAL, CKMB, CKMBINDEX, TROPONINI in the last 168 hours. BNP (last 3 results) No results for input(s): PROBNP in the last 8760 hours. HbA1C: Recent Labs    11/18/20 1116  HGBA1C 8.7*    CBG: Recent Labs  Lab 11/20/20 0746 11/20/20 1202 11/20/20 1544 11/20/20 2126 11/21/20 0758  GLUCAP 209* 249* 192* 182* 161*    Lipid Profile: Recent Labs    11/18/20 1116  CHOL 231*  HDL 35*  LDLCALC 153*  TRIG 214*  CHOLHDL 6.6    Thyroid Function Tests: Recent Labs    11/21/20 0150  TSH 3.920   Anemia Panel: No results for input(s): VITAMINB12, FOLATE, FERRITIN, TIBC, IRON, RETICCTPCT in the last 72 hours. Sepsis Labs: No results for input(s): PROCALCITON, LATICACIDVEN in the last 168 hours.  Recent Results (from the past 240 hour(s))  Resp Panel by RT-PCR (Flu A&B, Covid) Nasopharyngeal Swab     Status: Abnormal   Collection Time: 11/17/20  4:05 PM   Specimen: Nasopharyngeal Swab; Nasopharyngeal(NP) swabs in vial transport medium  Result Value Ref Range Status   SARS Coronavirus 2 by RT PCR POSITIVE (A) NEGATIVE Final    Comment: RESULT CALLED TO, READ BACK BY AND VERIFIED WITH: [place] NICKERSON RN 11/17/20 @1642  BY JW (NOTE) SARS-CoV-2 target nucleic acids are DETECTED.  The SARS-CoV-2 RNA is generally detectable in upper respiratory specimens during the acute phase of  infection. Positive results are indicative of the presence of the identified virus, but do not rule out bacterial infection or co-infection with other pathogens not detected by the test. Clinical correlation with patient history and other diagnostic information is necessary to determine patient infection status. The expected result is Negative.  Fact Sheet for Patients:  Fact Sheet for Healthcare Providers: BloggerCourse.com  This test is not yet approved or cleared by the [employer]iousBroker.it FDA and  has been authorized for detection and/or diagnosis of SARS-CoV-2 by FDA under an Emergency Use Authorization (EUA).  This EUA will remain in effect (meaning this test c an be used) for the duration of  the COVID-19 declaration under Section 564(b)(1) of the Act, 21 U.S.C. section 360bbb-3(b)(1), unless the authorization is terminated or revoked sooner.     Influenza A by PCR NEGATIVE NEGATIVE Final   Influenza B by PCR NEGATIVE NEGATIVE Final    Comment: (NOTE) The Xpert Xpress SARS-CoV-2/FLU/RSV plus assay is intended as an aid in the diagnosis of influenza from Nasopharyngeal swab specimens and should not be used as a sole basis for treatment. Nasal washings and aspirates are unacceptable for Xpert Xpress SARS-CoV-2/FLU/RSV testing.  Fact Sheet for Patients: [place]  Fact Sheet for Healthcare Providers: BloggerCourse.com  This test is not yet approved or cleared by the [employer]ious[employer]ker.it and has been authorized  for detection and/or diagnosis of SARS-CoV-2 by FDA under an Emergency Use Authorization (EUA). This EUA will remain in effect (meaning this test can be used) for the duration of the COVID-19 declaration under Section 564(b)(1) of the Act, 21 U.S.C. section 360bbb-3(b)(1), unless the authorization is terminated or revoked.  Performed at [facility], [address] [place], [place] [address]   Urine Culture     Status: Abnormal (Preliminary result)   Collection Time: 11/18/20  4:37 PM   Specimen: Urine, Clean Catch  Result Value Ref Range Status   Specimen Description URINE, CLEAN CATCH  Final   Special Requests NONE  Final   Culture (A)  Final    >=100,000 COLONIES/mL KLEBSIELLA PNEUMONIAE SUSCEPTIBILITIES TO FOLLOW Performed at [facility], [address] [place], [place] [address]    Report Status PENDING  Incomplete      Radiology Studies: No results found.   LOS: 2 days   Lanae Boast, MD [facility]  11/21/2020, 8:02 AM

## 2020-11-21 NOTE — Progress Notes (Signed)
   11/21/20 1529  Assess: MEWS Score  Temp 99.1 F (37.3 C)  BP (!) 181/33  Pulse Rate (!) 48  ECG Heart Rate (!) 47  Resp (!) 21  Level of Consciousness Alert  Assess: MEWS Score  MEWS Temp 0  MEWS Systolic 0  MEWS Pulse 1  MEWS RR 1  MEWS LOC 0  MEWS Score 2  MEWS Score Color Yellow  Assess: if the MEWS score is Yellow or Red  Were vital signs taken at a resting state? Yes  Focused Assessment No change from prior assessment  Early Detection of Sepsis Score *See Row Information* Low  MEWS guidelines implemented *See Row Information* Yes  Treat  MEWS Interventions Administered scheduled meds/treatments  Pain Scale 0-10  Pain Score 0  Take Vital Signs  Increase Vital Sign Frequency  Yellow: Q 2hr X 2 then Q 4hr X 2, if remains yellow, continue Q 4hrs  Escalate  MEWS: Escalate Yellow: discuss with charge nurse/RN and consider discussing with provider and RRT  Notify: Charge Nurse/RN  Name of Charge Nurse/RN Notified Amrah P RN  Date Charge Nurse/RN Notified 11/21/20  Time Charge Nurse/RN Notified 1533  Notify: Provider  Provider Name/Title Dr. Jonathon Bellows  Date Provider Notified 11/21/20  Time Provider Notified 1535  Notification Type Page  Notification Reason Change in status (Yellow MEWS)  Provider response No new orders  Date of Provider Response 11/21/20  Time of Provider Response 1600  Document  Patient Outcome Stabilized after interventions  Progress note created (see row info) Yes

## 2020-11-22 DIAGNOSIS — J9601 Acute respiratory failure with hypoxia: Secondary | ICD-10-CM | POA: Diagnosis not present

## 2020-11-22 DIAGNOSIS — R001 Bradycardia, unspecified: Secondary | ICD-10-CM | POA: Diagnosis not present

## 2020-11-22 LAB — BASIC METABOLIC PANEL
Anion gap: 7 (ref 5–15)
BUN: 46 mg/dL — ABNORMAL HIGH (ref 8–23)
CO2: 26 mmol/L (ref 22–32)
Calcium: 8.3 mg/dL — ABNORMAL LOW (ref 8.9–10.3)
Chloride: 104 mmol/L (ref 98–111)
Creatinine, Ser: 1.27 mg/dL — ABNORMAL HIGH (ref 0.44–1.00)
GFR, Estimated: 43 mL/min — ABNORMAL LOW (ref >60)
Glucose, Bld: 149 mg/dL — ABNORMAL HIGH (ref 70–99)
Potassium: 3.6 mmol/L (ref 3.5–5.1)
Sodium: 137 mmol/L (ref 135–145)

## 2020-11-22 LAB — CBC
HCT: 32 % — ABNORMAL LOW (ref 36.0–46.0)
Hemoglobin: 9.8 g/dL — ABNORMAL LOW (ref 12.0–15.0)
MCH: 25.9 pg — ABNORMAL LOW (ref 26.0–34.0)
MCHC: 30.6 g/dL (ref 30.0–36.0)
MCV: 84.4 fL (ref 80.0–100.0)
Platelets: 239 10*3/uL (ref 150–400)
RBC: 3.79 MIL/uL — ABNORMAL LOW (ref 3.87–5.11)
RDW: 14.2 % (ref 11.5–15.5)
WBC: 10.8 10*3/uL — ABNORMAL HIGH (ref 4.0–10.5)
nRBC: 0 % (ref 0.0–0.2)

## 2020-11-22 LAB — GLUCOSE, CAPILLARY
Glucose-Capillary: 160 mg/dL — ABNORMAL HIGH (ref 70–99)
Glucose-Capillary: 179 mg/dL — ABNORMAL HIGH (ref 70–99)
Glucose-Capillary: 201 mg/dL — ABNORMAL HIGH (ref 70–99)
Glucose-Capillary: 186 mg/dL — ABNORMAL HIGH (ref 70–99)

## 2020-11-22 MED ORDER — IRBESARTAN 300 MG PO TABS
300.0000 mg | ORAL_TABLET | Freq: Every day | ORAL | Status: DC
  Administered 2020-11-22 – 2020-11-25 (×4): 300 mg via ORAL
  Filled 2020-11-22 (×4): qty 1

## 2020-11-22 MED ORDER — CIPROFLOXACIN HCL 500 MG PO TABS
500.0000 mg | ORAL_TABLET | Freq: Two times a day (BID) | ORAL | Status: AC
  Administered 2020-11-22 – 2020-11-23 (×4): 500 mg via ORAL
  Filled 2020-11-22 (×4): qty 1

## 2020-11-22 MED ORDER — HYDRALAZINE HCL 50 MG PO TABS
50.0000 mg | ORAL_TABLET | Freq: Three times a day (TID) | ORAL | Status: DC
  Administered 2020-11-22 – 2020-11-25 (×9): 50 mg via ORAL
  Filled 2020-11-22 (×9): qty 1

## 2020-11-22 MED ORDER — HYDRALAZINE HCL 20 MG/ML IJ SOLN
5.0000 mg | Freq: Four times a day (QID) | INTRAMUSCULAR | Status: DC | PRN
  Administered 2020-11-22 – 2020-11-24 (×3): 5 mg via INTRAVENOUS
  Filled 2020-11-22 (×3): qty 1

## 2020-11-22 MED ORDER — HYDRALAZINE HCL 25 MG PO TABS
25.0000 mg | ORAL_TABLET | ORAL | Status: AC
  Administered 2020-11-22: 25 mg via ORAL
  Filled 2020-11-22: qty 1

## 2020-11-22 NOTE — Progress Notes (Signed)
[facility] Progress Note  Subjective: creat down to 1.2 today, UOP good. K+ better. SBP remain high but DBP's are unusually low.   Vitals:   11/21/20 2351 11/22/20 0404 11/22/20 0735 11/22/20 0800  BP: (!) 190/83 (!) 208/46    Pulse: 74 82  82  Resp:      Temp: 98.8 F (37.1 C) 99.2 F (37.3 C)    TempSrc: Oral Oral    SpO2:   97%   Weight:  93.8 kg    Height:        Exam:  alert, nad   no jvd  Chest cta bilat  Cor reg no RG  Abd soft ntnd no ascites   Ext no LE edema   Alert, NF, ox3          Home meds include lipitor, symbicort, coreg 12.5 bid, hygroton, celexa, cymbalta, tricor, flonase, hydralazine 25 tid, synthroid, metformin, klor-con, trazodone, valsartan 300 qd, xarelto 10 qd, prednisone taper, prn's      Date                          Creat               eGFR   2012- 2014                0.9- 1.09   2018                          0.95- 1.12        48-52, CKD 3a   Feb-march 2021       0.95- 1.52        32-57   11/17/20                        1.05                 54, CKD 3a   9/7                             1.03   9/9                             1.83                 28   11/21/20                      2.84, 1.94        16,26      UA 11/18/20 - turbid, many bact, 0-5 rbc, prot 30, >50 wbc    Na 129 K 5.8  CO2 24  BUN 57  Cr 1.94   Ca 8.4  Alb 3.1  LFT"s ok    WBC 8.1  Hb 9.6      ECHO 9/07 - LVEF 60-65%, G1DD, ^'d lvedp, normal PA pressures, posterior pericardial effusion, no sig valve issues    CXR 9/6 - IMPRESSION: Stable cardiomegaly. Otherwise, no radiographic evidence of acute cardiopulmonary process.    Lasix 40 mg IV on 9/6, 9/8     Irbesartan 300 mg 9/7- 9/9 dc'd     IV contrast 64 ml 9/07 w/ CTA chest   Assessment/ Plan: AKI on CKD 3a - b/l creat 0.95 - 1.10, eGFR 48- 52.  Creat  here was at baseline on admission, 1.03.  Suspect AKI due to contrast exposure + lasix/ vol depletion +/-  ARB effects.  Lasix/ ARB stopped and getting IVF"s and  creat improved further today down to 1.2 today. OK to restart ARB w/ recovered AKI. No further suggestions, will sign off.  HTN - on 3 bp meds at home including ARB. SOB - not sure cause, but doesn't appear to have CHF.   Klebsiella UTI - per pmd Recent COVID infection - from 4 wks ago, still testing + here.  H/o PE - on a/c w/ xarelto       Virginia Summers 11/22/2020, 9:15 AM   Recent Labs  Lab 11/21/20 0150 11/21/20 1528 11/22/20 0326  K 5.4* 5.8* 3.6  BUN 61* 57* 46*  CREATININE 2.84* 1.94* 1.27*  CALCIUM 8.4* 8.4* 8.3*  HGB 9.6*  --  9.8*   Inpatient medications:  atorvastatin  20 mg Oral Daily   ciprofloxacin  500 mg Oral BID   DULoxetine  30 mg Oral Daily   fenofibrate  160 mg Oral Daily   fluticasone  2 spray Each Nare Daily   hydrALAZINE  50 mg Oral Q8H   insulin aspart  0-9 Units Subcutaneous TID WC   insulin glargine-yfgn  4 Units Subcutaneous Daily   levothyroxine  25 mcg Oral Q0600   melatonin  3 mg Oral QHS   mometasone-formoterol  2 puff Inhalation BID   predniSONE  10 mg Oral Q breakfast   rivaroxaban  10 mg Oral Daily    sodium chloride 50 mL/hr at 11/22/20 0713   acetaminophen **OR** acetaminophen, albuterol, guaiFENesin, hydrALAZINE, ipratropium-albuterol, ondansetron (ZOFRAN) IV, traMADol, traZODone

## 2020-11-22 NOTE — Progress Notes (Signed)
Pharmacy Antibiotic Note  Virginia Summers is a 80 y.o. female admitted on 11/17/2020 with UTI.  Pharmacy has been consulted for Ciprofloxacin dosing.  Plan: Continue ciprofloxacin 500 mg BID for 2 more days (9/11-9/12) for a total of 5 days of treatment. Continue to monitor renal function, clinical status, and abx plan.  Height: 4\' 11"  (149.9 cm) Weight: 93.8 kg (206 lb 12.7 oz) IBW/kg (Calculated) : 43.2  Temp (24hrs), Avg:98.8 F (37.1 C), Min:98 F (36.7 C), Max:99.2 F (37.3 C)  Recent Labs  Lab 11/17/20 1553 11/17/20 1711 11/18/20 1116 11/20/20 0841 11/21/20 0150 11/21/20 1528 11/22/20 0326  WBC 9.0  --   --   --  8.1  --  10.8*  CREATININE  --    < > 1.03* 1.83* 2.84* 1.94* 1.27*   < > = values in this interval not displayed.    Estimated Creatinine Clearance: 35.4 mL/min (A) (by C-G formula based on SCr of 1.27 mg/dL (H)).    Allergies  Allergen Reactions   Penicillins Swelling and Rash    Did it involve swelling of the face/tongue/throat, SOB, or low BP? No Did it involve sudden or severe rash/hives, skin peeling, or any reaction on the inside of your mouth or nose? No Did you need to seek medical attention at a hospital or doctor's office? No When did it last happen?    childhood   If all above answers are "NO", may proceed with cephalosporin use.   Sulfa Drugs Cross Reactors Swelling and Rash    Antimicrobials this admission: cipro 9/7 >>   Dose adjustments this admission: None.  Microbiology results: 9/10 UCx: klebsiella    Thank you for allowing pharmacy to be a part of this patient's care.  11/10, PharmD PGY1 Pharmacy Resident  Please check AMION for all [facility] pharmacy phone numbers After 10:00 PM call main pharmacy [phone]

## 2020-11-22 NOTE — Progress Notes (Signed)
PROGRESS NOTE    Virginia Summers  UXN:[id number] DOB: January 15, 1941 DOA: 11/17/2020 PCP: Eather Colas, FNP   Chief Complaint  Patient presents with   Shortness of Breath   Chest Pain    Brief Narrative:  80 year old female with history of hypertension, PE, hypothyroidism, diabetes presented with shortness of breath over the last 3 days 4 days, worse with exertion, has not been taking her medication for last 2 weeks due to running out of them.  Also complaining of right knee pain after she had abnormal movement when she twisted her leg about 3 weeks ago.  Recently diagnosed with COVID a month ago when she had a fever and upper respiratory tract symptoms for 10 days.  In the ED, found to have blood pressure more than 200 systolic.  Patient was afebrile.  Chest x-ray was unremarkable.  Since patient's lower extremity was slightly swollen Dopplers were done which were negative for PE.  Labs show creatinine 1.05 hemoglobin 9.1 BNP of 258 high sensitive troponins were 16 and 18.  COVID test was positive.  Patient was given Lasix 40 mg IV and IV hydralazine.  Following which blood pressure improved and patient admitted for further observation Cardiology was consulted.Serial troponin 16>18>18>17.  Blood pressure stabilizing resume home meds.Still on oxygen and weaning down.  Patient had AKI with diuretics and ARB were held and placed on IV fluids with improvement  Subjective: Patient still does not feel well.  On 2 L regularly.  Overnight blood pressure running high 180s to 200 Spoke with a.m. nurse to use IV meds  Assessment & Plan:  Acute respiratory failure with hypoxemia  Cough Recent COVID-19 infection a month ago COPD bnp mildly up at 258 from 216. chest x-ray with stable cardiomegaly.  echocardiogram shows normal EF, G1DD.  Patient described her chest pain similar to her previous episode when she had PE-CT angio was done that showed no PE but scattered areas of mild peripheral groundglass  likely infectious or inflammatory in etiology, suspect due to recent COVID.  Tried a dose of  ivLasix to help with oxygenation 9/8.  Continue supplemental oxygen, bronchodilators antitussives and oral steroid-short course.  She will likely need to go home with oxygen.  Chest pain:Work-up with mildly positive troponin no delta,echocardiogram shows normal EF,G1DD.Seen by cards- and planning outpatient nuclear stress test.  Hypertension urgency in ED: BP had stabilized-and has been soft and w/ bradycardia and AKI and her meds were discontinued-including Coreg, chlorthalidone, ARB.  But blood pressure now poorly controlled, will increase oral hydralazine from 25 to 50 mg , add prn iv hydralazine, resuming Irbesartan as KAI better-discussed w/ nephro. hopefully can resume other meds too.   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  Cardiology following if does not resolve may need pacemaker.   Hyponatremia: Likely from diuretics and chlorthalidone, sodium has improved with IV fluids and holding of chlorthalidone.   Recent Labs  Lab 11/18/20 1116 11/20/20 0841 11/21/20 0150 11/21/20 1528 11/22/20 0326  NA 137 129* 130* 129* 137    AKI on CKD stage IIIa: AKI likely multifactorial in the setting of uncontrolled hypertension with background diabetes, multiple meds with chlorthalidone/ARB at home also received dose of Lasix, IV contrast for CTA, w/ G1DD.AKI nicely resolving with IV fluid hydration.  Creatinine hyperelastic 50 mill per hour appreciate nephrology input renal ultrasound unremarkable..  Recent Labs  Lab 11/18/20 1116 11/20/20 0841 11/21/20 0150 11/21/20 1528 11/22/20 0326  BUN 22 42* 61* 57* 46*  CREATININE 1.03* 1.83* 2.84* 1.94* 1.27*    Intake/Output Summary (Last 24 hours) at 11/22/2020 0745 Last data filed at 11/21/2020 1755 Gross per 24 hour  Intake 1062.26 ml  Output 850 ml  Net 212.26 ml     Hyperkalemia: Due to AKI.  Resolved with Lokelma and  Kayexalate.  On renal diet Recent Labs  Lab 11/18/20 1116 11/20/20 0841 11/21/20 0150 11/21/20 1528 11/22/20 0326  K 4.0 4.5 5.4* 5.8* 3.6     Klebsiella pneumoniaeUTI : complaining of dysuria UA abnormal -ciprofloxacin x3 days ordered has penicillin allergy- will cont for total of 5 days  Right knee pain-x-ray showed osteoarthritis.    History of DVT/PE Dopplers negative for DVT.  On home xarelto low dose.  Chronic anemia likely from chronic kidney disease. Stable Recent Labs  Lab 11/17/20 1553 11/21/20 0150 11/22/20 0326  HGB 11.1* 9.6* 9.8*  HCT 36.3 31.6* 32.0*     Hypomagnesemia-repleted. HLD cont statin Hypothyroidism on Synthroid Diabetes mellitus type 2 in obese: Blood sugar on higher side added low-dose Lantus , cont ssi, cont to monitor.  Continue to adjust Recent Labs  Lab 11/20/20 2126 11/21/20 0758 11/21/20 1141 11/21/20 1616 11/21/20 2103  GLUCAP 182* 161* 209* 227* 179*   Thyroid nodules in CT: Ultrasound thyroid benign no need for biopsy.  Class II obesity with BMI 38.9: Will benefit with weight loss PCP follow-up healthy lifestyle  Diet Order             Diet renal/carb modified with fluid restriction Diet-HS Snack? Nothing; Room service appropriate? Yes; Fluid consistency: Thin  Diet effective now                    Patient's Body mass index is 41.77 kg/m.  DVT prophylaxis: rivaroxaban (XARELTO) tablet 10 mg Start: 11/18/20 1000 Code Status:   Code Status: Full Code  Family Communication: plan of care discussed with patient at bedside.  Called her daughter Clydie Braun -no answer then called another daughter Karoline Caldwell and discussed and updated her plan in detail.  Transfer to progressive care unit  given her bradycardia AKI hypoxia.  Status is: inpatient Remains hospitalized for ongoing management Dispo: The patient is from: Home              Anticipated d/c is to: Home cont PT OT evaluation              Patient currently is not medically  stable to d/c.   Difficult to place patient No  Unresulted Labs (From admission, onward)     Start     Ordered   11/21/20 0815  Urinalysis, Routine w reflex microscopic Urine, Clean Catch  ONCE - STAT,   STAT        11/21/20 0814   11/21/20 0500  Basic metabolic panel  Daily,   R     Question:  Specimen collection method  Answer:  Lab=Lab collect   11/20/20 1134   11/21/20 0500  CBC  Daily,   R     Question:  Specimen collection method  Answer:  Lab=Lab collect   11/20/20 1134           Medications reviewed:  Scheduled Meds:  atorvastatin  20 mg Oral Daily   DULoxetine  30 mg Oral Daily   fenofibrate  160 mg Oral Daily   fluticasone  2 spray Each Nare Daily   hydrALAZINE  25 mg Oral Q8H   insulin aspart  0-9 Units Subcutaneous TID WC  insulin glargine-yfgn  4 Units Subcutaneous Daily   levothyroxine  25 mcg Oral Q0600   melatonin  3 mg Oral QHS   mometasone-formoterol  2 puff Inhalation BID   predniSONE  10 mg Oral Q breakfast   rivaroxaban  10 mg Oral Daily   Continuous Infusions:  sodium chloride 50 mL/hr at 11/22/20 0713   Consultants:see note  Procedures:see note Antimicrobials: Anti-infectives (From admission, onward)    Start     Dose/Rate Route Frequency Ordered Stop   11/21/20 0434  ciprofloxacin (CIPRO) tablet 250 mg       Note to Pharmacy: UTI   250 mg Oral Daily with breakfast 11/21/20 0435 11/21/20 0843   11/18/20 2100  ciprofloxacin (CIPRO) tablet 500 mg  Status:  Discontinued       Note to Pharmacy: UTI   500 mg Oral 2 times daily 11/18/20 2008 11/21/20 0435   11/18/20 0000  doxycycline (DORYX) 100 MG EC tablet        100 mg Oral 2 times daily 11/18/20 1410 11/25/20 2359      Culture/Microbiology    Component Value Date/Time   SDES URINE, CLEAN CATCH 11/18/2020 1637   SPECREQUEST  11/18/2020 1637    NONE Performed at [facility], [address] [place], [place] [address]    CULT >=100,000 COLONIES/mL KLEBSIELLA PNEUMONIAE (A)  11/18/2020 1637   REPTSTATUS 11/21/2020 FINAL 11/18/2020 1637    Other culture-see note  Objective: Vitals: Today's Vitals   11/21/20 2351 11/22/20 0100 11/22/20 0404 11/22/20 0735  BP: (!) 190/83  (!) 208/46   Pulse: 74  82   Resp:      Temp: 98.8 F (37.1 C)  99.2 F (37.3 C)   TempSrc: Oral  Oral   SpO2:    97%  Weight:   93.8 kg   Height:      PainSc:  0-No pain      Intake/Output Summary (Last 24 hours) at 11/22/2020 0745 Last data filed at 11/21/2020 1755 Gross per 24 hour  Intake 1062.26 ml  Output 850 ml  Net 212.26 ml    Filed Weights   11/19/20 0500 11/22/20 0404  Weight: 87.4 kg 93.8 kg   Weight change:   Intake/Output from previous day: 09/10 0701 - 09/11 0700 In: 1062.3 [P.O.:360; I.V.:702.3] Out: 850 [Urine:850] Intake/Output this shift: No intake/output data recorded. Filed Weights   11/19/20 0500 11/22/20 0404  Weight: 87.4 kg 93.8 kg    Examination: General exam: AAOx 3, obese, pleasant, hadr of hearing HEENT:Oral mucosa moist, Ear/Nose WNL grossly, dentition normal. Respiratory system: bilaterally diminished, midl wheezing,no use of accessory muscle Cardiovascular system: S1 & S2 +, No JVD,. Gastrointestinal system: Abdomen soft,NT,ND, BS+ Nervous System:Alert, awake, moving extremities and grossly nonfocal Extremities: no edema, distal peripheral pulses palpable.  Skin: No rashes,no icterus. MSK: Normal muscle bulk,tone, power      Data Reviewed: I have personally reviewed following labs and imaging studies CBC: Recent Labs  Lab 11/17/20 1553 11/21/20 0150 11/22/20 0326  WBC 9.0 8.1 10.8*  NEUTROABS 5.6  --   --   HGB 11.1* 9.6* 9.8*  HCT 36.3 31.6* 32.0*  MCV 85.4 86.1 84.4  PLT 293 246 239    Basic Metabolic Panel: Recent Labs  Lab 11/18/20 1116 11/20/20 0841 11/21/20 0150 11/21/20 1528 11/22/20 0326  NA 137 129* 130* 129* 137  K 4.0 4.5 5.4* 5.8* 3.6  CL 99 93* 95* 97* 104  CO2 28 30 25 24  26  GLUCOSE 172* 205*  152* 239* 149*  BUN 22 42* 61* 57* 46*  CREATININE 1.03* 1.83* 2.84* 1.94* 1.27*  CALCIUM 9.3 8.5* 8.4* 8.4* 8.3*  MG 1.5*  --   --   --   --     GFR: Estimated Creatinine Clearance: 35.4 mL/min (A) (by C-G formula based on SCr of 1.27 mg/dL (H)). Liver Function Tests: Recent Labs  Lab 11/17/20 1635 11/21/20 0825  AST 25 22  ALT 11 14  ALKPHOS 49 41  BILITOT 0.3 0.4  PROT 6.0* 6.0*  ALBUMIN 2.9* 3.1*    Recent Labs  Lab 11/17/20 1635  LIPASE 34    No results for input(s): AMMONIA in the last 168 hours. Coagulation Profile: No results for input(s): INR, PROTIME in the last 168 hours. Cardiac Enzymes: No results for input(s): CKTOTAL, CKMB, CKMBINDEX, TROPONINI in the last 168 hours. BNP (last 3 results) No results for input(s): PROBNP in the last 8760 hours. HbA1C: No results for input(s): HGBA1C in the last 72 hours.  CBG: Recent Labs  Lab 11/20/20 2126 11/21/20 0758 11/21/20 1141 11/21/20 1616 11/21/20 2103  GLUCAP 182* 161* 209* 227* 179*    Lipid Profile: No results for input(s): CHOL, HDL, LDLCALC, TRIG, CHOLHDL, LDLDIRECT in the last 72 hours.  Thyroid Function Tests: Recent Labs    11/21/20 0150  TSH 3.920    Anemia Panel: No results for input(s): VITAMINB12, FOLATE, FERRITIN, TIBC, IRON, RETICCTPCT in the last 72 hours. Sepsis Labs: No results for input(s): PROCALCITON, LATICACIDVEN in the last 168 hours.  Recent Results (from the past 240 hour(s))  Resp Panel by RT-PCR (Flu A&B, Covid) Nasopharyngeal Swab     Status: Abnormal   Collection Time: 11/17/20  4:05 PM   Specimen: Nasopharyngeal Swab; Nasopharyngeal(NP) swabs in vial transport medium  Result Value Ref Range Status   SARS Coronavirus 2 by RT PCR POSITIVE (A) NEGATIVE Final    Comment: RESULT CALLED TO, READ BACK BY AND VERIFIED WITH: [place]ORDAN NICKERSON RN 11/17/20 @1642  BY JW (NOTE) SARS-CoV-2 target nucleic acids are DETECTED.  The SARS-CoV-2 RNA is generally detectable in upper  respiratory specimens during the acute phase of infection. Positive results are indicative of the presence of the identified virus, but do not rule out bacterial infection or co-infection with other pathogens not detected by the test. Clinical correlation with patient history and other diagnostic information is necessary to determine patient infection status. The expected result is Negative.  Fact Sheet for Patients: BloggerCourse.comhttps://www.fda.gov/media/152166/download  Fact Sheet for Healthcare Providers: SeriousBroker.ithttps://www.fda.gov/media/152162/download  This test is not yet approved or cleared by the [place] FDA and  has been authorized for detection and/or diagnosis of SARS-CoV-2 by FDA under an Emergency Use Authorization (EUA).  This EUA will remain in effect (meaning this test c an be used) for the duration of  the COVID-19 declaration under Section 564(b)(1) of the Act, 21 U.S.C. section 360bbb-3(b)(1), unless the authorization is terminated or revoked sooner.     Influenza A by PCR NEGATIVE NEGATIVE Final   Influenza B by PCR NEGATIVE NEGATIVE Final    Comment: (NOTE) The Xpert Xpress SARS-CoV-2/FLU/RSV plus assay is intended as an aid in the diagnosis of influenza from Nasopharyngeal swab specimens and should not be used as a sole basis for treatment. Nasal washings and aspirates are unacceptable for Xpert Xpress SARS-CoV-2/FLU/RSV testing.  Fact Sheet for Patients: BloggerCourse.comhttps://www.fda.gov/media/152166/download  Fact Sheet for Healthcare Providers: SeriousBroker.ithttps://www.fda.gov/media/152162/download  This test is not yet approved or cleared by the [place]  FDA and has been authorized for detection and/or diagnosis of SARS-CoV-2 by FDA under an Emergency Use Authorization (EUA). This EUA will remain in effect (meaning this test can be used) for the duration of the COVID-19 declaration under Section 564(b)(1) of the Act, 21 U.S.C. section 360bbb-3(b)(1), unless the authorization is  terminated or revoked.  Performed at [facility], [address], [place], [place] [address]   Urine Culture     Status: Abnormal   Collection Time: 11/18/20  4:37 PM   Specimen: Urine, Clean Catch  Result Value Ref Range Status   Specimen Description URINE, CLEAN CATCH  Final   Special Requests   Final    NONE Performed at [facility], [address], [place], [place] [address]    Culture >=100,000 COLONIES/mL KLEBSIELLA PNEUMONIAE (A)  Final   Report Status 11/21/2020 FINAL  Final   Organism ID, Bacteria KLEBSIELLA PNEUMONIAE (A)  Final      Susceptibility   Klebsiella pneumoniae - MIC*    AMPICILLIN >=32 RESISTANT Resistant     CEFAZOLIN <=4 SENSITIVE Sensitive     CEFEPIME <=0.12 SENSITIVE Sensitive     CEFTRIAXONE <=0.25 SENSITIVE Sensitive     CIPROFLOXACIN <=0.25 SENSITIVE Sensitive     GENTAMICIN <=1 SENSITIVE Sensitive     IMIPENEM 1 SENSITIVE Sensitive     NITROFURANTOIN 64 INTERMEDIATE Intermediate     TRIMETH/SULFA <=20 SENSITIVE Sensitive     AMPICILLIN/SULBACTAM <=2 SENSITIVE Sensitive     PIP/TAZO <=4 SENSITIVE Sensitive     * >=100,000 COLONIES/mL KLEBSIELLA PNEUMONIAE      Radiology Studies: US RENAL  Result Date: 11/21/2020 CLINICAL DATA:  Acute kidney injury. EXAM: RENAL / URINARY TRACT ULTRASOUND COMPLETE COMPARISON:  Abdomen CT dated 05/10/2019 FINDINGS: Right Kidney: Renal measurements: 9.7 x 4.9 x 4.6 cm = volume: 114 mL. Echogenicity within normal limits. No mass or hydronephrosis visualized. Left Kidney: Renal measurements: 9.7 x 5.0 x 4.9 cm = volume: 124 mL. Normal echotexture. 1.9 cm simple appearing cyst in the upper pole. No hydronephrosis. Bladder: Small amount of dependent debris. Other: None. IMPRESSION: 1. No acute abnormality. 2. Small amount of dependent debris in the urinary bladder. Electronically Signed   By: Beckie Salts M.D.   On: 11/21/2020 15:41     LOS: 3 days   Lanae Boast, MD [facility]  11/22/2020, 7:45 AM

## 2020-11-22 NOTE — Progress Notes (Signed)
Progress Note  Patient Name: Virginia Summers Date of Encounter: 11/22/2020  [facility] Cardiologist: Kristeen Miss, MD   Subjective   Pt denies CP or dyspnea; nauseated; "just don't feel good"  Inpatient Medications    Scheduled Meds:  atorvastatin  20 mg Oral Daily   ciprofloxacin  500 mg Oral BID   DULoxetine  30 mg Oral Daily   fenofibrate  160 mg Oral Daily   fluticasone  2 spray Each Nare Daily   hydrALAZINE  50 mg Oral Q8H   insulin aspart  0-9 Units Subcutaneous TID WC   insulin glargine-yfgn  4 Units Subcutaneous Daily   irbesartan  300 mg Oral Daily   levothyroxine  25 mcg Oral Q0600   melatonin  3 mg Oral QHS   mometasone-formoterol  2 puff Inhalation BID   predniSONE  10 mg Oral Q breakfast   rivaroxaban  10 mg Oral Daily   Continuous Infusions:  sodium chloride 50 mL/hr at 11/22/20 0713   PRN Meds: acetaminophen **OR** acetaminophen, albuterol, guaiFENesin, hydrALAZINE, ipratropium-albuterol, ondansetron (ZOFRAN) IV, traMADol, traZODone   Vital Signs    Vitals:   11/21/20 2351 11/22/20 0404 11/22/20 0735 11/22/20 0800  BP: (!) 190/83 (!) 208/46    Pulse: 74 82  82  Resp:      Temp: 98.8 F (37.1 C) 99.2 F (37.3 C)  99.1 F (37.3 C)  TempSrc: Oral Oral  Oral  SpO2:   97%   Weight:  93.8 kg    Height:        Intake/Output Summary (Last 24 hours) at 11/22/2020 1047 Last data filed at 11/22/2020 0900 Gross per 24 hour  Intake 1342.26 ml  Output 850 ml  Net 492.26 ml    Last 3 Weights 11/22/2020 11/19/2020 04/17/2020  Weight (lbs) 206 lb 12.7 oz 192 lb 10.9 oz 190 lb  Weight (kg) 93.8 kg 87.4 kg 86.183 kg      Telemetry    NSR- Personally Reviewe  Physical Exam   GEN: NAD Neck: Supple Cardiac: RRR, 2/6 systolic murmur Respiratory: CTA GI: Soft, NT/ND MS: No edema Neuro:  Grossly intact Psych: Normal affect   Labs    High Sensitivity Troponin:   Recent Labs  Lab 11/17/20 1711 11/17/20 1943 11/17/20 2159 11/17/20 2347   TROPONINIHS 16 18* 18* 17       Chemistry Recent Labs  Lab 11/17/20 1635 11/17/20 1711 11/21/20 0150 11/21/20 0825 11/21/20 1528 11/22/20 0326  NA  --    < > 130*  --  129* 137  K  --    < > 5.4*  --  5.8* 3.6  CL  --    < > 95*  --  97* 104  CO2  --    < > 25  --  24 26  GLUCOSE  --    < > 152*  --  239* 149*  BUN  --    < > 61*  --  57* 46*  CREATININE  --    < > 2.84*  --  1.94* 1.27*  CALCIUM  --    < > 8.4*  --  8.4* 8.3*  PROT 6.0*  --   --  6.0*  --   --   ALBUMIN 2.9*  --   --  3.1*  --   --   AST 25  --   --  22  --   --   ALT 11  --   --  14  --   --   ALKPHOS 49  --   --  41  --   --   BILITOT 0.3  --   --  0.4  --   --   GFRNONAA  --    < > 16*  --  26* 43*  ANIONGAP  --    < > 10  --  8 7   < > = values in this interval not displayed.      Hematology Recent Labs  Lab 11/17/20 1553 11/21/20 0150 11/22/20 0326  WBC 9.0 8.1 10.8*  RBC 4.25 3.67* 3.79*  HGB 11.1* 9.6* 9.8*  HCT 36.3 31.6* 32.0*  MCV 85.4 86.1 84.4  MCH 26.1 26.2 25.9*  MCHC 30.6 30.4 30.6  RDW 14.3 14.5 14.2  PLT 293 246 239     BNP Recent Labs  Lab 11/17/20 1635  BNP 258.7*       Patient Profile     80 year old female with past medical history of hypertension, previous pulmonary embolus on Xarelto, hypothyroidism, diabetes mellitus, hyperlipidemia for evaluation of chest pain and palpitations.  Patient did have a cardiac catheterization in 2008 revealing normal coronary arteries.  Echocardiogram shows normal LV function, grade 1 diastolic dysfunction and trivial pericardial effusion.  Lower extremity venous Doppler showed no DVT.  Assessment & Plan    1 junctional bradycardia-has resolved after discontinuing beta-blocker.  Continue to avoid these medications.  Note LV function normal on recent echocardiogram.  2 chest pain-as outlined previously symptoms were extremely atypical.  Troponins not consistent with acute coronary syndrome.  Echocardiogram showed normal LV  function.  Plan outpatient nuclear study for risk stratification when she improves.     3 acute kidney disease-improving; likely secondary to over diuresis.  4 hypertension-blood pressure is elevated; hydralazine has been reinitiated and nephrology is reinitiating ARB.  Follow and adjust as needed.  5 UTI-on ciprofloxacin.   For questions or updates, please contact [facility] Please consult [email or web address] for contact info under        Signed, Olga Millers, MD  11/22/2020, 10:47 AM

## 2020-11-23 DIAGNOSIS — I160 Hypertensive urgency: Secondary | ICD-10-CM | POA: Diagnosis not present

## 2020-11-23 DIAGNOSIS — J9601 Acute respiratory failure with hypoxia: Secondary | ICD-10-CM | POA: Diagnosis not present

## 2020-11-23 LAB — CBC
HCT: 31.2 % — ABNORMAL LOW (ref 36.0–46.0)
Hemoglobin: 9.7 g/dL — ABNORMAL LOW (ref 12.0–15.0)
MCH: 26.5 pg (ref 26.0–34.0)
MCHC: 31.1 g/dL (ref 30.0–36.0)
MCV: 85.2 fL (ref 80.0–100.0)
Platelets: 247 10*3/uL (ref 150–400)
RBC: 3.66 MIL/uL — ABNORMAL LOW (ref 3.87–5.11)
RDW: 14.6 % (ref 11.5–15.5)
WBC: 10.3 10*3/uL (ref 4.0–10.5)
nRBC: 0 % (ref 0.0–0.2)

## 2020-11-23 LAB — BASIC METABOLIC PANEL
Anion gap: 12 (ref 5–15)
BUN: 27 mg/dL — ABNORMAL HIGH (ref 8–23)
CO2: 27 mmol/L (ref 22–32)
Calcium: 8.4 mg/dL — ABNORMAL LOW (ref 8.9–10.3)
Chloride: 98 mmol/L (ref 98–111)
Creatinine, Ser: 0.96 mg/dL (ref 0.44–1.00)
GFR, Estimated: 60 mL/min — ABNORMAL LOW (ref >60)
Glucose, Bld: 169 mg/dL — ABNORMAL HIGH (ref 70–99)
Potassium: 3.6 mmol/L (ref 3.5–5.1)
Sodium: 137 mmol/L (ref 135–145)

## 2020-11-23 LAB — GLUCOSE, RANDOM: Glucose, Bld: 273 mg/dL — ABNORMAL HIGH (ref 70–99)

## 2020-11-23 LAB — GLUCOSE, CAPILLARY
Glucose-Capillary: 195 mg/dL — ABNORMAL HIGH (ref 70–99)
Glucose-Capillary: 269 mg/dL — ABNORMAL HIGH (ref 70–99)
Glucose-Capillary: 208 mg/dL — ABNORMAL HIGH (ref 70–99)
Glucose-Capillary: 203 mg/dL — ABNORMAL HIGH (ref 70–99)

## 2020-11-23 MED ORDER — HYDRALAZINE HCL 20 MG/ML IJ SOLN
10.0000 mg | INTRAMUSCULAR | Status: AC
  Administered 2020-11-23: 10 mg via INTRAVENOUS
  Filled 2020-11-23: qty 1

## 2020-11-23 MED ORDER — DEXTROSE 50 % IV SOLN
INTRAVENOUS | Status: AC
  Filled 2020-11-23: qty 50

## 2020-11-23 MED ORDER — CHLORTHALIDONE 25 MG PO TABS
25.0000 mg | ORAL_TABLET | Freq: Every day | ORAL | Status: DC
  Administered 2020-11-23 – 2020-11-25 (×3): 25 mg via ORAL
  Filled 2020-11-23 (×3): qty 1

## 2020-11-23 MED ORDER — AMLODIPINE BESYLATE 5 MG PO TABS
5.0000 mg | ORAL_TABLET | Freq: Every day | ORAL | Status: DC
Start: 2020-11-23 — End: 2020-11-24
  Administered 2020-11-23: 5 mg via ORAL
  Filled 2020-11-23: qty 1

## 2020-11-23 NOTE — Progress Notes (Signed)
PT Cancellation Note  Patient Details Name: Virginia Summers MRN: [id number] DOB: 06-08-1940   Cancelled Treatment:    Reason Eval/Treat Not Completed: Patient not medically ready.  Held by MD/RN due to BP's >230 systolic.  Will see as able 9/13. 11/23/2020  Jacinto Halim., PT Acute Rehabilitation Services [phone]  (pager) [phone]  (office)   Virginia Summers 11/23/2020, 2:34 PM

## 2020-11-23 NOTE — Progress Notes (Signed)
Inpatient Diabetes Program Recommendations  AACE/ADA: New Consensus Statement on Inpatient Glycemic Control   Target Ranges:  Prepandial:   less than 140 mg/dL      Peak postprandial:   less than 180 mg/dL (1-2 hours)      Critically ill patients:  140 - 180 mg/dL   Results for Virginia Summers, Virginia Summers (MRN [id number]) as of 11/23/2020 12:11  Ref. Range 11/22/2020 08:38 11/22/2020 12:09 11/22/2020 16:38 11/22/2020 21:48 11/23/2020 07:53 11/23/2020 9:05 11/23/2020 11:30 11/23/2020 11:37  Glucose-Capillary Latest Ref Range: 70 - 99 mg/dL 017 (H)  Novolog 2 units  Semglee 4 units 179 (H)  Novolog 2 units 201 (H)  Novolog 3 units 186 (H) 195 (H)     Semglee 4 units   Novolog 2 units 39 (LL) 269 (H)    Review of Glycemic Control  Diabetes history: DM2 Outpatient Diabetes medications: Metformin XR 500 mg BID Current orders for Inpatient glycemic control: None  Inpatient Diabetes Program Recommendations:    Insulin: Noted hypoglycemia today and Semglee and Novolog correction was discontinued. Please consider reordering Novolog 0-9 units TID with meals and Novolog 0-5 units QHS.  Thanks, Orlando Penner, RN, MSN, CDE Diabetes Coordinator Inpatient Diabetes Program [phone] (Team Pager from 8am to 5pm)

## 2020-11-23 NOTE — Progress Notes (Signed)
Progress Note  Patient Name: Virginia Summers Date of Encounter: 11/23/2020  [facility] Cardiologist: Kristeen Miss, MD   Subjective   Just doesn't feel well nausea and poor appetite   Inpatient Medications    Scheduled Meds:  atorvastatin  20 mg Oral Daily   ciprofloxacin  500 mg Oral BID   DULoxetine  30 mg Oral Daily   fenofibrate  160 mg Oral Daily   fluticasone  2 spray Each Nare Daily   hydrALAZINE  50 mg Oral Q8H   insulin aspart  0-9 Units Subcutaneous TID WC   insulin glargine-yfgn  4 Units Subcutaneous Daily   irbesartan  300 mg Oral Daily   levothyroxine  25 mcg Oral Q0600   melatonin  3 mg Oral QHS   mometasone-formoterol  2 puff Inhalation BID   rivaroxaban  10 mg Oral Daily   Continuous Infusions:  sodium chloride 50 mL/hr at 11/22/20 1058   PRN Meds: acetaminophen **OR** acetaminophen, albuterol, guaiFENesin, hydrALAZINE, ipratropium-albuterol, ondansetron (ZOFRAN) IV, traMADol, traZODone   Vital Signs    Vitals:   11/22/20 1945 11/23/20 0722 11/23/20 0800 11/23/20 0841  BP:   (!) 217/59 (!) 224/63  Pulse:   81   Resp:   18   Temp:   98.9 F (37.2 C)   TempSrc:   Oral   SpO2: 96% 94% 91%   Weight:      Height:        Intake/Output Summary (Last 24 hours) at 11/23/2020 0905 Last data filed at 11/23/2020 0800 Gross per 24 hour  Intake 700 ml  Output --  Net 700 ml   Last 3 Weights 11/22/2020 11/19/2020 04/17/2020  Weight (lbs) 206 lb 12.7 oz 192 lb 10.9 oz 190 lb  Weight (kg) 93.8 kg 87.4 kg 86.183 kg      Telemetry    NSR- rates 80 11/23/2020   Physical Exam   GEN: NAD Neck: Supple Cardiac: RRR, 2/6 systolic murmur Respiratory: CTA GI: Soft, NT/ND MS: No edema Neuro:  Grossly intact Psych: Normal affect   Labs    High Sensitivity Troponin:   Recent Labs  Lab 11/17/20 1711 11/17/20 1943 11/17/20 2159 11/17/20 2347  TROPONINIHS 16 18* 18* 17      Chemistry Recent Labs  Lab 11/17/20 1635 11/17/20 1711 11/21/20 0825  11/21/20 1528 11/22/20 0326 11/23/20 0151  NA  --    < >  --  129* 137 137  K  --    < >  --  5.8* 3.6 3.6  CL  --    < >  --  97* 104 98  CO2  --    < >  --  24 26 27   GLUCOSE  --    < >  --  239* 149* 169*  BUN  --    < >  --  57* 46* 27*  CREATININE  --    < >  --  1.94* 1.27* 0.96  CALCIUM  --    < >  --  8.4* 8.3* 8.4*  PROT 6.0*  --  6.0*  --   --   --   ALBUMIN 2.9*  --  3.1*  --   --   --   AST 25  --  22  --   --   --   ALT 11  --  14  --   --   --   ALKPHOS 49  --  41  --   --   --  BILITOT 0.3  --  0.4  --   --   --   GFRNONAA  --    < >  --  26* 43* 60*  ANIONGAP  --    < >  --  8 7 12    < > = values in this interval not displayed.     Hematology Recent Labs  Lab 11/21/20 0150 11/22/20 0326 11/23/20 0151  WBC 8.1 10.8* 10.3  RBC 3.67* 3.79* 3.66*  HGB 9.6* 9.8* 9.7*  HCT 31.6* 32.0* 31.2*  MCV 86.1 84.4 85.2  MCH 26.2 25.9* 26.5  MCHC 30.4 30.6 31.1  RDW 14.5 14.2 14.6  PLT 246 239 247    BNP Recent Labs  Lab 11/17/20 1635  BNP 258.7*      Patient Profile     80 year old female with past medical history of hypertension, previous pulmonary embolus on Xarelto, hypothyroidism, diabetes mellitus, hyperlipidemia for evaluation of chest pain and palpitations.  Patient did have a cardiac catheterization in 2008 revealing normal coronary arteries.  Echocardiogram shows normal LV function, grade 1 diastolic dysfunction and trivial pericardial effusion.  Lower extremity venous Doppler showed no DVT.  Assessment & Plan    1 junctional bradycardia-has resolved after discontinuing beta-blocker.  Continue to avoid these medications.  Note LV function normal on recent echocardiogram.NSR rates 80 bpm this am   2 chest pain-as outlined previously symptoms were extremely atypical.  Troponins not consistent with acute coronary syndrome.  Echocardiogram showed normal LV function.  Plan outpatient nuclear study for risk stratification when she improves.     3 acute  kidney disease-back to baseline - overdiuresed   4 hypertension-blood pressure is elevated; hydralazine has been reinitiated and nephrology is reinitiating ARB.  Follow and adjust as needed. Add NOrvasc 10 mg daily   5 UTI-on ciprofloxacin.   For questions or updates, please contact [facility] Please consult [email or web address] for contact info under        Signed, 2009, MD  11/23/2020, 9:05 AM   Patient ID: 01/23/2021, female   DOB: 1940/06/19, 80 y.o.   MRN: [id number]

## 2020-11-23 NOTE — Progress Notes (Signed)
PROGRESS NOTE    Virginia Summers  ZOX:[id number] DOB: 12-26-1940 DOA: 11/17/2020 PCP: [name], Megan A, FNP   Chief Complaint  Patient presents with   Shortness of Breath   Chest Pain    Brief Narrative:  80 year old female with history of hypertension, PE, hypothyroidism, diabetes presented with shortness of breath over the last 3 days 4 days, worse with exertion, has not been taking her medication for last 2 weeks due to running out of them.  Also complaining of right knee pain after she had abnormal movement when she twisted her leg about 3 weeks ago.  Recently diagnosed with COVID a month ago when she had a fever and upper respiratory tract symptoms for 10 days.  In the ED, found to have blood pressure more than 200 systolic.  Patient was afebrile.  Chest x-ray was unremarkable.  Since patient's lower extremity was slightly swollen Dopplers were done which were negative for PE.  Labs show creatinine 1.05 hemoglobin 9.1 BNP of 258 high sensitive troponins were 16 and 18.  COVID test was positive.  Patient was given Lasix 40 mg IV and IV hydralazine.  Following which blood pressure improved and patient admitted for further observation Cardiology was consulted.Serial troponin 16>18>18>17.  Blood pressure stabilizing resume home meds.Still on oxygen and weaning down.  Patient had AKI with diuretics and ARB were held and placed on IV fluids with improvement  Subjective:  Up with PT, mild shortness of breath  On 1.5l [place] Doesn't feel well. BP running high in 200s Daughter at bedside Creat improved. Patient became hypoglycemic later on in 30s improved with oral  Assessment & Plan:  Acute respiratory failure with hypoxemia  Cough Recent COVID-19 infection a month ago COPD bnp mildly up at 258 from 216. chest x-ray with stable cardiomegaly.  echocardiogram shows normal EF, G1DD.  Patient described her chest pain similar to her previous episode when she had PE-CT angio was done that showed no PE  but scattered areas of mild peripheral groundglass likely infectious or inflammatory in etiology, suspect due to recent COVID.  Tried a dose of  ivLasix to help with oxygenation 9/8.  Continue supplemental oxygen, bronchodilators antitussives and oral steroid-short course-which she completed.  May need to go home with oxygen.  Chest pain:Work-up with mildly positive troponin no delta,echocardiogram shows normal EF,G1DD.Seen by cards- and planning outpatient nuclear stress test.  Hypertension urgency in ED: BP had stabilized-and has been soft and w/ bradycardia and AKI and her meds were discontinued-including Coreg, chlorthalidone, ARB.  But blood pressure remains poorly controlled, 9/12- amlodipine 5 mg added , chlorthalidone resumed, continue on increased dose of hydralazine 50 mg every 8 hours, continue irbesartan, PRN IV hydralazine  Junctional bradycardia heart rate in 40s-Coreg has been held.  Cardiology following-heart rate has been resolved, plan is not to resume beta-blocker.   Hyponatremia: Likely from diuretics and chlorthalidone, sodium has improved with IV fluids .  Resuming chlorthalidone and monitor.   Recent Labs  Lab 11/20/20 0841 11/21/20 0150 11/21/20 1528 11/22/20 0326 11/23/20 0151  NA 129* 130* 129* 137 137     AKI on CKD stage IIIa: AKI likely multifactorial in the setting of uncontrolled hypertension with background diabetes, multiple meds with chlorthalidone/ARB at home also received dose of Lasix, IV contrast for CTA, w/ G1DD.AKI has read all.  Resuming ARB and diuretics. renal ultrasound unremarkable..  Recent Labs  Lab 11/20/20 0841 11/21/20 0150 11/21/20 1528 11/22/20 0326 11/23/20 0151  BUN 42* 61* 57* 46* 27*  CREATININE 1.83* 2.84* 1.94* 1.27* 0.96    Intake/Output Summary (Last 24 hours) at 11/23/2020 1028 Last data filed at 11/23/2020 0800 Gross per 24 hour  Intake 700 ml  Output --  Net 700 ml     Hyperkalemia: Due to AKI.  Resolved with Lokelma  and Kayexalate.  On renal diet> changing to regular diet Recent Labs  Lab 11/20/20 0841 11/21/20 0150 11/21/20 1528 11/22/20 0326 11/23/20 0151  K 4.5 5.4* 5.8* 3.6 3.6     Klebsiella pneumoniaeUTI : complaining of dysuria UA abnormal -ciprofloxacin for total 5 days  Right knee pain-x-ray showed osteoarthritis.    History of DVT/PE Dopplers negative for DVT.  On home xarelto low dose.  Chronic anemia likely from chronic kidney disease. Stable Recent Labs  Lab 11/17/20 1553 11/21/20 0150 11/22/20 0326 11/23/20 0151  HGB 11.1* 9.6* 9.8* 9.7*  HCT 36.3 31.6* 32.0* 31.2*     Hypomagnesemia-repleted. HLD cont statin Hypothyroidism on Synthroid.  Diabetes mellitus type 2 in obese: Hypoglycemia this morning resolved with oral intake.  We will discontinue Lantus and sliding scale, send BMP.  Changed to regular diet.   Recent Labs  Lab 11/22/20 0838 11/22/20 1209 11/22/20 1638 11/22/20 2148 11/23/20 0753  GLUCAP 160* 179* 201* 186* 195*   Thyroid nodules in CT: Ultrasound thyroid benign no need for biopsy.  Class II obesity with BMI 38.9: Will benefit with weight loss PCP follow-up healthy lifestyle  Diet Order             Diet renal/carb modified with fluid restriction Diet-HS Snack? Nothing; Room service appropriate? Yes; Fluid consistency: Thin  Diet effective now                    Patient's Body mass index is 41.77 kg/m.  DVT prophylaxis: rivaroxaban (XARELTO) tablet 10 mg Start: 11/18/20 1000 Code Status:   Code Status: Full Code  Family Communication: plan of care discussed with patient and her daughter at bedsidel.  Transfer to progressive care unit  given her bradycardia AKI hypoxia.  Status is: inpatient Remains hospitalized for ongoing management Dispo: The patient is from: Home              Anticipated d/c is to: Home hopefully next 24 hours once blood pressure is stabilized.                Patient currently is not medically stable to d/c.    Difficult to place patient No  Unresulted Labs (From admission, onward)     Start     Ordered   11/21/20 0815  Urinalysis, Routine w reflex microscopic Urine, Clean Catch  ONCE - STAT,   STAT        11/21/20 0814           Medications reviewed:  Scheduled Meds:  amLODipine  5 mg Oral Daily   atorvastatin  20 mg Oral Daily   ciprofloxacin  500 mg Oral BID   DULoxetine  30 mg Oral Daily   fenofibrate  160 mg Oral Daily   fluticasone  2 spray Each Nare Daily   hydrALAZINE  50 mg Oral Q8H   insulin aspart  0-9 Units Subcutaneous TID WC   insulin glargine-yfgn  4 Units Subcutaneous Daily   irbesartan  300 mg Oral Daily   levothyroxine  25 mcg Oral Q0600   melatonin  3 mg Oral QHS   mometasone-formoterol  2 puff Inhalation BID   rivaroxaban  10 mg Oral Daily  Continuous Infusions:   Consultants:see note  Procedures:see note Antimicrobials: Anti-infectives (From admission, onward)    Start     Dose/Rate Route Frequency Ordered Stop   11/22/20 0915  ciprofloxacin (CIPRO) tablet 500 mg        500 mg Oral 2 times daily 11/22/20 0827 11/24/20 0759   11/21/20 0434  ciprofloxacin (CIPRO) tablet 250 mg       Note to Pharmacy: UTI   250 mg Oral Daily with breakfast 11/21/20 0435 11/21/20 0843   11/18/20 2100  ciprofloxacin (CIPRO) tablet 500 mg  Status:  Discontinued       Note to Pharmacy: UTI   500 mg Oral 2 times daily 11/18/20 2008 11/21/20 0435   11/18/20 0000  doxycycline (DORYX) 100 MG EC tablet        100 mg Oral 2 times daily 11/18/20 1410 11/25/20 2359      Culture/Microbiology    Component Value Date/Time   SDES URINE, CLEAN CATCH 11/18/2020 1637   SPECREQUEST  11/18/2020 1637    NONE Performed at [facility], [address] [place], [place] [address]    CULT >=100,000 COLONIES/mL KLEBSIELLA PNEUMONIAE (A) 11/18/2020 1637   REPTSTATUS 11/21/2020 FINAL 11/18/2020 1637    Other culture-see note  Objective: Vitals: Today's Vitals   11/23/20 0722  11/23/20 0800 11/23/20 0841 11/23/20 1026  BP:  (!) 217/59 (!) 224/63 (!) 228/42  Pulse:  81  90  Resp:  18    Temp:  98.9 F (37.2 C)    TempSrc:  Oral    SpO2: 94% 91%  92%  Weight:      Height:      PainSc:        Intake/Output Summary (Last 24 hours) at 11/23/2020 1028 Last data filed at 11/23/2020 0800 Gross per 24 hour  Intake 700 ml  Output --  Net 700 ml    Filed Weights   11/19/20 0500 11/22/20 0404  Weight: 87.4 kg 93.8 kg   Weight change:   Intake/Output from previous day: 09/11 0701 - 09/12 0700 In: 1000 [P.O.:400; I.V.:600] Out: -  Intake/Output this shift: Total I/O In: 100 [P.O.:100] Out: -  Filed Weights   11/19/20 0500 11/22/20 0404  Weight: 87.4 kg 93.8 kg    Examination: General exam:AAOx 3, hard of hearing. Elderly, obese. Pleasant. HEENT:Oral mucosa moist, Ear/Nose WNL grossly, dentition normal. Respiratory system: bilaterally air entry +, mild exp wheezing, but wheezing coming from upper airway neck Cardiovascular system: S1 & S2 +, No JVD,. Gastrointestinal system: Abdomen soft, NT,ND, BS+ Nervous System:Alert, awake, moving extremities and grossly nonfocal Extremities: non pitting edema-on hands,distal peripheral pulses palpable.  Skin: No rashes,no icterus. MSK: Normal muscle bulk,tone, power    Data Reviewed: I have personally reviewed following labs and imaging studies CBC: Recent Labs  Lab 11/17/20 1553 11/21/20 0150 11/22/20 0326 11/23/20 0151  WBC 9.0 8.1 10.8* 10.3  NEUTROABS 5.6  --   --   --   HGB 11.1* 9.6* 9.8* 9.7*  HCT 36.3 31.6* 32.0* 31.2*  MCV 85.4 86.1 84.4 85.2  PLT 293 246 239 247    Basic Metabolic Panel: Recent Labs  Lab 11/18/20 1116 11/20/20 0841 11/21/20 0150 11/21/20 1528 11/22/20 0326 11/23/20 0151  NA 137 129* 130* 129* 137 137  K 4.0 4.5 5.4* 5.8* 3.6 3.6  CL 99 93* 95* 97* 104 98  CO2 28 30 25 24 26 27   GLUCOSE 172* 205* 152* 239* 149* 169*  BUN 22  42* 61* 57* 46* 27*  CREATININE 1.03*  1.83* 2.84* 1.94* 1.27* 0.96  CALCIUM 9.3 8.5* 8.4* 8.4* 8.3* 8.4*  MG 1.5*  --   --   --   --   --     GFR: Estimated Creatinine Clearance: 46.8 mL/min (by C-G formula based on SCr of 0.96 mg/dL). Liver Function Tests: Recent Labs  Lab 11/17/20 1635 11/21/20 0825  AST 25 22  ALT 11 14  ALKPHOS 49 41  BILITOT 0.3 0.4  PROT 6.0* 6.0*  ALBUMIN 2.9* 3.1*    Recent Labs  Lab 11/17/20 1635  LIPASE 34    No results for input(s): AMMONIA in the last 168 hours. Coagulation Profile: No results for input(s): INR, PROTIME in the last 168 hours. Cardiac Enzymes: No results for input(s): CKTOTAL, CKMB, CKMBINDEX, TROPONINI in the last 168 hours. BNP (last 3 results) No results for input(s): PROBNP in the last 8760 hours. HbA1C: No results for input(s): HGBA1C in the last 72 hours.  CBG: Recent Labs  Lab 11/22/20 0838 11/22/20 1209 11/22/20 1638 11/22/20 2148 11/23/20 0753  GLUCAP 160* 179* 201* 186* 195*    Lipid Profile: No results for input(s): CHOL, HDL, LDLCALC, TRIG, CHOLHDL, LDLDIRECT in the last 72 hours.  Thyroid Function Tests: Recent Labs    11/21/20 0150  TSH 3.920    Anemia Panel: No results for input(s): VITAMINB12, FOLATE, FERRITIN, TIBC, IRON, RETICCTPCT in the last 72 hours. Sepsis Labs: No results for input(s): PROCALCITON, LATICACIDVEN in the last 168 hours.  Recent Results (from the past 240 hour(s))  Resp Panel by RT-PCR (Flu A&B, Covid) Nasopharyngeal Swab     Status: Abnormal   Collection Time: 11/17/20  4:05 PM   Specimen: Nasopharyngeal Swab; Nasopharyngeal(NP) swabs in vial transport medium  Result Value Ref Range Status   SARS Coronavirus 2 by RT PCR POSITIVE (A) NEGATIVE Final    Comment: RESULT CALLED TO, READ BACK BY AND VERIFIED WITH: [place] NICKERSON RN 11/17/20  BY JW (NOTE) SARS-CoV-2 target nucleic acids are DETECTED.  The SARS-CoV-2 RNA is generally detectable in upper respiratory specimens during the acute phase of  infection. Positive results are indicative of the presence of the identified virus, but do not rule out bacterial infection or co-infection with other pathogens not detected by the test. Clinical correlation with patient history and other diagnostic information is necessary to determine patient infection status. The expected result is Negative.  Fact Sheet for Patients: BloggerCourse.com  Fact Sheet for Healthcare Providers: SeriousBroker.it  This test is not yet approved or cleared by the [place] FDA and  has been authorized for detection and/or diagnosis of SARS-CoV-2 by FDA under an Emergency Use Authorization (EUA).  This EUA will remain in effect (meaning this test c an be used) for the duration of  the COVID-19 declaration under Section 564(b)(1) of the Act, 21 U.S.C. section 360bbb-3(b)(1), unless the authorization is terminated or revoked sooner.     Influenza A by PCR NEGATIVE NEGATIVE Final   Influenza B by PCR NEGATIVE NEGATIVE Final    Comment: (NOTE) The Xpert Xpress SARS-CoV-2/FLU/RSV plus assay is intended as an aid in the diagnosis of influenza from Nasopharyngeal swab specimens and should not be used as a sole basis for treatment. Nasal washings and aspirates are unacceptable for Xpert Xpress SARS-CoV-2/FLU/RSV testing.  Fact Sheet for Patients: BloggerCourse.com  Fact Sheet for Healthcare Providers: SeriousBroker.it  This test is not yet approved or cleared by the [place] and has been authorized  for detection and/or diagnosis of SARS-CoV-2 by FDA under an Emergency Use Authorization (EUA). This EUA will remain in effect (meaning this test can be used) for the duration of the COVID-19 declaration under Section 564(b)(1) of the Act, 21 U.S.C. section 360bbb-3(b)(1), unless the authorization is terminated or revoked.  Performed at [facility], [address] [place], [place] [address]   Urine Culture     Status: Abnormal   Collection Time: 11/18/20  4:37 PM   Specimen: Urine, Clean Catch  Result Value Ref Range Status   Specimen Description URINE, CLEAN CATCH  Final   Special Requests   Final    NONE Performed at [facility], [address], [place], [place] [address]    Culture >=100,000 COLONIES/mL KLEBSIELLA PNEUMONIAE (A)  Final   Report Status 11/21/2020 FINAL  Final   Organism ID, Bacteria KLEBSIELLA PNEUMONIAE (A)  Final      Susceptibility   Klebsiella pneumoniae - MIC*    AMPICILLIN >=32 RESISTANT Resistant     CEFAZOLIN <=4 SENSITIVE Sensitive     CEFEPIME <=0.12 SENSITIVE Sensitive     CEFTRIAXONE <=0.25 SENSITIVE Sensitive     CIPROFLOXACIN <=0.25 SENSITIVE Sensitive     GENTAMICIN <=1 SENSITIVE Sensitive     IMIPENEM 1 SENSITIVE Sensitive     NITROFURANTOIN 64 INTERMEDIATE Intermediate     TRIMETH/SULFA <=20 SENSITIVE Sensitive     AMPICILLIN/SULBACTAM <=2 SENSITIVE Sensitive     PIP/TAZO <=4 SENSITIVE Sensitive     * >=100,000 COLONIES/mL KLEBSIELLA PNEUMONIAE      Radiology Studies: US RENAL  Result Date: 11/21/2020 CLINICAL DATA:  Acute kidney injury. EXAM: RENAL / URINARY TRACT ULTRASOUND COMPLETE COMPARISON:  Abdomen CT dated 05/10/2019 FINDINGS: Right Kidney: Renal measurements: 9.7 x 4.9 x 4.6 cm = volume: 114 mL. Echogenicity within normal limits. No mass or hydronephrosis visualized. Left Kidney: Renal measurements: 9.7 x 5.0 x 4.9 cm = volume: 124 mL. Normal echotexture. 1.9 cm simple appearing cyst in the upper pole. No hydronephrosis. Bladder: Small amount of dependent debris. Other: None. IMPRESSION: 1. No acute abnormality. 2. Small amount of dependent debris in the urinary bladder. Electronically Signed   By: Beckie Salts M.D.   On: 11/21/2020 15:41     LOS: 4 days   Lanae Boast, MD [facility]  11/23/2020, 10:28 AM

## 2020-11-23 NOTE — Care Management Important Message (Signed)
Important Message  Patient Details  Name: Virginia Summers MRN: [id number] Date of Birth: June 27, 1940   Medicare Important Message Given:  Yes     Kennedy Bohanon Stefan Church 11/23/2020, 3:19 PM

## 2020-11-23 NOTE — Progress Notes (Signed)
Occupational Therapy Treatment Patient Details Name: Virginia Summers MRN: [id number] DOB: 14-Sep-1940 Today's Date: 11/23/2020   History of present illness Pt is 80 yo female admitted on 11/17/20 with shortness of breath and near syncope symptoms.  Pt with recent COVID.  Per notes pt has not been taking meds recently  and in HTN urgency - medication restarted and 1 dose lasix (note states will closely monitor BP).  CT Chest with scattered areas of groundglass -suspect related to COVID.  Pt with hx of HTN, PE, hypothyroidism, DM.  She also recently twisted her R leg   OT comments  Unfortunately, patient unable to demonstrate observable progress this session with decreased "6-Clicks" AM-PAC score from 17/24 to 15/24 due to some regression, and continued restrictions from elevated BP.  Patient remains limited by anxiety, generalized weakness and decreased activity tolerance along with deficits noted above and below. Pt placed on new bedrest order while OT in room. RN in to assist with pt back to bed and MD to room to discuss. Pt continues to demonstrate good rehab potential and would benefit from continued skilled OT to increase safety and independence with ADLs and functional transfers to allow pt to return home safely and reduce caregiver burden and fall risk.  Discharge plan changed to SNF due to pt's current limitations and needs.     Recommendations for follow up therapy are one component of a multi-disciplinary discharge planning process, led by the attending physician.  Recommendations may be updated based on patient status, additional functional criteria and insurance authorization.    Follow Up Recommendations  SNF    Equipment Recommendations  Tub/shower seat    Recommendations for Other Services      Precautions / Restrictions Precautions Precautions: Fall Precaution Comments: monitor HR (low) and orthostatic bp.  with x-large cuff remained low 102/33.  changed to adult cuff and was  123/69.  Remains symptomatic with dizziness. Restrictions Weight Bearing Restrictions: No Other Position/Activity Restrictions: While OT in room, cleared by RN, MD entered new bedrest order on 9/12 due to elevated BP. RN to room to inform OT of new order and pt assisted back to bed with RN and MD present.       Mobility Bed Mobility Overal bed mobility: Needs Assistance Bed Mobility: Supine to Sit     Supine to sit: Min guard;HOB elevated          Transfers Overall transfer level: Needs assistance Equipment used: Rolling walker (2 wheeled) Transfers: Sit to/from [employer] Sit to Stand: Min assist;Mod assist (See ADLs) Stand pivot transfers: Min assist       General transfer comment: Stood from EOB and recliner. Pivot with RW EOB->Recliner->EOB    Balance Overall balance assessment: Needs assistance Sitting-balance support: No upper extremity supported Sitting balance-Leahy Scale: Good     Standing balance support: Bilateral upper extremity supported;Single extremity supported Standing balance-Leahy Scale: Poor Standing balance comment: Reliant on UE support on RW.                           ADL either performed or assessed with clinical judgement   ADL Overall ADL's : Needs assistance/impaired     Grooming: Wash/dry face;Sitting;Set up       Lower Body Bathing: Total assistance;Sitting/lateral leans Lower Body Bathing Details (indicate cue type and reason): Total assist to bathe lower extremities from just proximal to knee to feet due to pure wick leaking once standing. Pt assisted  at recliner level.     Lower Body Dressing: Total assistance;Bed level;Sitting/lateral leans;Sit to/from stand Lower Body Dressing Details (indicate cue type and reason): Total assist to doff went socks and don dry socks at recliner level.  Total Assist to don mesh underwear over feet at bed level and to pull over hiops with pt standing, due to pt requiring  BUE support on RW to maintain balance. Toilet Transfer: Minimal assistance;Moderate assistance;RW;Cueing for sequencing;Cueing for safety;Stand-pivot Toilet Transfer Details (indicate cue type and reason): Cues for hand placement. Pt became anxious pivoting back to bed from recliner and releasing RW, reaching arm around. Cues to keep BUEs on walker and cues for step by step sequencing. Pt performed stand from EOB with Min As, pivot to recliner with RW and Min As.  Stood from Medical illustrator with Mod assist and returned to EOB with RW and Min As, verbal cues. Toileting- Clothing Manipulation and Hygiene: Total assistance Toileting - Clothing Manipulation Details (indicate cue type and reason): On pure wick which required several adjustments for comfort and to properly catch urine.     Functional mobility during ADLs: Minimal assistance;Rolling walker;Moderate assistance       Vision Patient Visual Report: No change from baseline Vision Assessment?: No apparent visual deficits   Perception     Praxis      Cognition Arousal/Alertness: Awake/alert Behavior During Therapy: Anxious;WFL for tasks assessed/performed Overall Cognitive Status: Within Functional Limits for tasks assessed                                 General Comments: "I'm scared."        Exercises     Shoulder Instructions       General Comments      Pertinent Vitals/ Pain       Pain Assessment: No/denies pain Faces Pain Scale: Hurts a little bit Pain Location: Stomach. Pt denied pain but endorsed nausea. Pain Intervention(s): Limited activity within patient's tolerance;Monitored during session;Repositioned  Home Living                                          Prior Functioning/Environment              Frequency  Min 2X/week        Progress Toward Goals  OT Goals(current goals can now be found in the care plan section)  Progress towards OT goals: Not progressing toward  goals - comment  Acute Rehab OT Goals Patient Stated Goal: For blood pressure to be controlled so can get out of bed. OT Goal Formulation: With patient Time For Goal Achievement: 12/04/20 Potential to Achieve Goals: Good  Plan Discharge plan needs to be updated    Co-evaluation                 AM-PAC OT "6 Clicks" Daily Activity     Outcome Measure   Help from another person eating meals?: None Help from another person taking care of personal grooming?: A Little Help from another person toileting, which includes using toliet, bedpan, or urinal?: A Lot Help from another person bathing (including washing, rinsing, drying)?: A Lot Help from another person to put on and taking off regular upper body clothing?: A Little Help from another person to put on and taking off regular lower body clothing?: Total 6 Click  Score: 15    End of Session Equipment Utilized During Treatment: Gait belt;Rolling walker;Oxygen  OT Visit Diagnosis: Unsteadiness on feet (R26.81);Muscle weakness (generalized) (M62.81);Dizziness and giddiness (R42)   Activity Tolerance Patient limited by fatigue (Limited by elevated blodd pressure.)   Patient Left in bed;with call bell/phone within reach;with nursing/sitter in room (PT sitting EOB with RN, CNA and MD in room. RN cleared OT to exit room.)   Nurse Communication Mobility status        Time: 3825-0539 OT Time Calculation (min): 55 min  Charges: OT General Charges $OT Visit: 1 Visit OT Treatments $Self Care/Home Management : 23-37 mins $Therapeutic Activity: 23-37 mins  Victorino Dike, OT Acute Rehab Services Office: [phone] 11/23/2020  Theodoro Clock 11/23/2020, 10:37 AM

## 2020-11-24 DIAGNOSIS — J9601 Acute respiratory failure with hypoxia: Secondary | ICD-10-CM

## 2020-11-24 DIAGNOSIS — U071 COVID-19: Secondary | ICD-10-CM

## 2020-11-24 DIAGNOSIS — I160 Hypertensive urgency: Principal | ICD-10-CM

## 2020-11-24 LAB — GLUCOSE, CAPILLARY
Glucose-Capillary: 166 mg/dL — ABNORMAL HIGH (ref 70–99)
Glucose-Capillary: 227 mg/dL — ABNORMAL HIGH (ref 70–99)
Glucose-Capillary: 208 mg/dL — ABNORMAL HIGH (ref 70–99)
Glucose-Capillary: 177 mg/dL — ABNORMAL HIGH (ref 70–99)

## 2020-11-24 MED ORDER — FUROSEMIDE 10 MG/ML IJ SOLN
20.0000 mg | Freq: Once | INTRAMUSCULAR | Status: AC
  Administered 2020-11-24: 20 mg via INTRAVENOUS
  Filled 2020-11-24: qty 2

## 2020-11-24 MED ORDER — AMLODIPINE BESYLATE 10 MG PO TABS
10.0000 mg | ORAL_TABLET | Freq: Every day | ORAL | Status: DC
  Administered 2020-11-24 – 2020-11-25 (×2): 10 mg via ORAL
  Filled 2020-11-24 (×3): qty 1

## 2020-11-24 MED ORDER — INSULIN ASPART 100 UNIT/ML IJ SOLN
0.0000 [IU] | Freq: Three times a day (TID) | INTRAMUSCULAR | Status: DC
  Administered 2020-11-24: 2 [IU] via SUBCUTANEOUS
  Administered 2020-11-25 (×2): 1 [IU] via SUBCUTANEOUS

## 2020-11-24 NOTE — Progress Notes (Addendum)
Progress Note  Patient Name: Virginia Summers Date of Encounter: 11/24/2020  [facility] Cardiologist: Kristeen Miss, MD   Subjective   80 year old female with a history of pulmonary emboli, obesity hypoventilation, obstructive sleep apnea hyperlipidemia, hypertension.  She has had a heart catheterization in 2008 that revealed normal coronary arteries.  She does have grade 1 diastolic dysfunction.  She has normal left ventricular systolic function.  She is admitted with acute worsening of her shortness of breath and chest pain..  Echocardiogram from September 7 reveals normal left ventricular systolic function.  She has grade 1 diastolic dysfunction.  Her RV function is normal.  Her estimated PA pressures are 29 which is normal.  Covid + as of Sept 6  Inpatient Medications    Scheduled Meds:  amLODipine  5 mg Oral Daily   atorvastatin  20 mg Oral Daily   chlorthalidone  25 mg Oral Daily   DULoxetine  30 mg Oral Daily   fenofibrate  160 mg Oral Daily   fluticasone  2 spray Each Nare Daily   hydrALAZINE  50 mg Oral Q8H   irbesartan  300 mg Oral Daily   levothyroxine  25 mcg Oral Q0600   melatonin  3 mg Oral QHS   mometasone-formoterol  2 puff Inhalation BID   rivaroxaban  10 mg Oral Daily   Continuous Infusions:  PRN Meds: acetaminophen **OR** acetaminophen, albuterol, guaiFENesin, hydrALAZINE, ipratropium-albuterol, ondansetron (ZOFRAN) IV, traMADol, traZODone   Vital Signs    Vitals:   11/23/20 2007 11/23/20 2032 11/23/20 2300 11/24/20 0354  BP:  (!) 182/38 (!) 197/51 (!) 155/41  Pulse:  80 80 81  Resp:  14 15 17   Temp:  97.8 F (36.6 C) 97.8 F (36.6 C) 97.8 F (36.6 C)  TempSrc:  Oral Oral   SpO2: 92% 95% 98% 98%  Weight:      Height:        Intake/Output Summary (Last 24 hours) at 11/24/2020 11/26/2020 Last data filed at 11/23/2020 0800 Gross per 24 hour  Intake 100 ml  Output --  Net 100 ml   Last 3 Weights 11/22/2020 11/19/2020 04/17/2020  Weight (lbs) 206 lb  12.7 oz 192 lb 10.9 oz 190 lb  Weight (kg) 93.8 kg 87.4 kg 86.183 kg      Telemetry    NSR  - Personally Reviewed  ECG     - Personally Reviewed  Physical Exam   GEN: obese , elderly female,  hard of hearing ,   moderately obese.  Neck: No JVD Cardiac: RRR, no murmurs, rubs, or gallops.  Respiratory: bilateral tight wheezing  GI: Soft, nontender, non-distended  MS: No edema; No deformity. Neuro:  Nonfocal  Psych: Normal affect   Labs    High Sensitivity Troponin:   Recent Labs  Lab 11/17/20 1711 11/17/20 1943 11/17/20 2159 11/17/20 2347  TROPONINIHS 16 18* 18* 17      Chemistry Recent Labs  Lab 11/17/20 1635 11/17/20 1711 11/21/20 0825 11/21/20 1528 11/22/20 0326 11/23/20 0151 11/23/20 1149  NA  --    < >  --  129* 137 137  --   K  --    < >  --  5.8* 3.6 3.6  --   CL  --    < >  --  97* 104 98  --   CO2  --    < >  --  24 26 27   --   GLUCOSE  --    < >  --  239* 149* 169* 273*  BUN  --    < >  --  57* 46* 27*  --   CREATININE  --    < >  --  1.94* 1.27* 0.96  --   CALCIUM  --    < >  --  8.4* 8.3* 8.4*  --   PROT 6.0*  --  6.0*  --   --   --   --   ALBUMIN 2.9*  --  3.1*  --   --   --   --   AST 25  --  22  --   --   --   --   ALT 11  --  14  --   --   --   --   ALKPHOS 49  --  41  --   --   --   --   BILITOT 0.3  --  0.4  --   --   --   --   GFRNONAA  --    < >  --  26* 43* 60*  --   ANIONGAP  --    < >  --  8 7 12   --    < > = values in this interval not displayed.     Hematology Recent Labs  Lab 11/21/20 0150 11/22/20 0326 11/23/20 0151  WBC 8.1 10.8* 10.3  RBC 3.67* 3.79* 3.66*  HGB 9.6* 9.8* 9.7*  HCT 31.6* 32.0* 31.2*  MCV 86.1 84.4 85.2  MCH 26.2 25.9* 26.5  MCHC 30.4 30.6 31.1  RDW 14.5 14.2 14.6  PLT 246 239 247    BNP Recent Labs  Lab 11/17/20 1635  BNP 258.7*     DDimer No results for input(s): DDIMER in the last 168 hours.   Radiology    No results found.  Cardiac Studies      Patient Profile     80 y.o.  female admitted with shortness of breath.  Her troponin levels are essentially negative.  Several levels are just barely above the normal range and are not consistent with an ACS.  She has active tight wheezing on exam.  Assessment & Plan  1.  Respiratory distress: I suspect that the etiology of her respiratory issues are pulmonary.  She has normal left ventricular systolic function.  She has grade 1 diastolic dysfunction.   She has active wheezing on exam.  Troponins are essentially unremarkable.  CT scan was negative for pulmonary embolus. She has scattered areas of mild peripheral groundglass densities that are likely infectious or inflammatory.  COVID + on Sept. 6   I will defer to the primary team or pulmonary for further evaluation and management of this post COVID respiratory issues    [facility] will sign off.   Medication Recommendations:  cont meds for her wheezing and respiratory distress  Other recommendations (labs, testing, etc):   Follow up as an outpatient:  with her primary md.   She will also need to follow up with Dr. [id number] or APP in several months .   For questions or updates, please contact [facility] Please consult [email or web address] for contact info under        Signed, Elease Hashimoto, MD  11/24/2020, 6:11 AM

## 2020-11-24 NOTE — Progress Notes (Signed)
Physical Therapy Treatment Patient Details Name: Virginia Summers MRN: [id number] DOB: 08-14-1940 Today's Date: 11/24/2020   History of Present Illness 80 yo female admitted 11/17/20 with SOB and near syncope as well as HTN urgency. Pt with recent COVID.  CT Chest with scattered areas of groundglass -suspect related to COVID.  PMhx: HTN, PE, hypothyroidism, DM.  She also recently twisted her R leg    PT Comments    Pt motivated to get OOB during session. Pt on 2L upon arrival. Taken to RA where pt desat to 85%. Returned to 2L where sats were maintained >90% throughout rest of session. Pt reported feeling weak and lightheaded. SBP remained <180.  Pt required similar assist to previous session with bed mobility and transfers (min guard) and ambulated in hall with RW and min guard. Required seated rest breaks in between walking bouts. Continue to recommend HHPT with supervision for mobility given pt's functional status and decreased activity tolerance. Will need increased support for d/c to HHPT. If support not available, recommend short-term SNF upon d/c.  Pre-vitals - 151/46(80), HR 86 Seated in chair - 163/44(84)   Recommendations for follow up therapy are one component of a multi-disciplinary discharge planning process, led by the attending physician.  Recommendations may be updated based on patient status, additional functional criteria and insurance authorization.  Follow Up Recommendations  Home health PT;Supervision for mobility/OOB     Equipment Recommendations  None recommended by PT    Recommendations for Other Services       Precautions / Restrictions Precautions Precautions: Fall Precaution Comments: watch BP and sats Restrictions Weight Bearing Restrictions: No     Mobility  Bed Mobility Overal bed mobility: Needs Assistance Bed Mobility: Supine to Sit     Supine to sit: Min guard;HOB elevated     General bed mobility comments: HOB 30 degrees with increased time and  use of rail    Transfers Overall transfer level: Needs assistance   Transfers: Sit to/from Stand Sit to Stand: Min guard         General transfer comment: cues for hand placement to stand from bed, BSC and chair. Pivot bed to [facility] due to urinary incontinence  Ambulation/Gait Ambulation/Gait assistance: Min guard Gait Distance (Feet): 20 Feet Assistive device: Rolling walker (2 wheeled) Gait Pattern/deviations: Step-through pattern;Decreased stride length;Trunk flexed Gait velocity: Decreased Gait velocity interpretation: <1.8 ft/sec, indicate of risk for recurrent falls General Gait Details: Pt walked 4' to sink and brush teeth then had to sit due to feeling weak. Walked an additional 19' then 17' with seated rest breaks in between.   Stairs             Wheelchair Mobility    Modified Rankin (Stroke Patients Only)       Balance                                            Cognition Arousal/Alertness: Awake/alert Behavior During Therapy: WFL for tasks assessed/performed Overall Cognitive Status: Within Functional Limits for tasks assessed                                 General Comments: [facility]      Exercises General Exercises - Lower Extremity Long Arc Quad: AROM;Both;Seated;15 reps Hip Flexion/Marching: AROM;Both;10 reps;Seated    General Comments  Pertinent Vitals/Pain Pain Assessment: No/denies pain    Home Living                      Prior Function            PT Goals (current goals can now be found in the care plan section) Progress towards PT goals: Progressing toward goals    Frequency    Min 3X/week      PT Plan Current plan remains appropriate    Co-evaluation              AM-PAC PT "6 Clicks" Mobility   Outcome Measure  Help needed turning from your back to your side while in a flat bed without using bedrails?: A Little Help needed moving from lying on your back to sitting  on the side of a flat bed without using bedrails?: A Little Help needed moving to and from a bed to a chair (including a wheelchair)?: A Little Help needed standing up from a chair using your arms (e.g., wheelchair or bedside chair)?: A Little Help needed to walk in hospital room?: A Little Help needed climbing 3-5 steps with a railing? : A Lot 6 Click Score: 17    End of Session Equipment Utilized During Treatment: Gait belt Activity Tolerance: Patient limited by fatigue;Patient tolerated treatment well Patient left: in chair;with call bell/phone within reach;with chair alarm set Nurse Communication: Mobility status PT Visit Diagnosis: Other abnormalities of gait and mobility (R26.89);Muscle weakness (generalized) (M62.81)     Time: 2549-8264 PT Time Calculation (min) (ACUTE ONLY): 54 min  Charges:  $Gait Training: 8-22 mins $Therapeutic Exercise: 8-22 mins $Therapeutic Activity: 23-37 mins                       Vance Gather 11/24/2020, 1:31 PM

## 2020-11-24 NOTE — Progress Notes (Signed)
SATURATION QUALIFICATIONS: (This note is used to comply with regulatory documentation for home oxygen)  Patient Saturations on Room Air at Rest = 85%  Patient Saturations on 2 Liters of oxygen while Ambulating = 92%  Please briefly explain why patient needs home oxygen: Pt requiring O2 at all times to maintain sats > 89%.

## 2020-11-24 NOTE — Discharge Summary (Signed)
Physician Discharge Summary  Virginia Summers NWG:[id number] DOB: 15-Jun-1940 DOA: 11/17/2020  PCP: Eather Colas, FNP  Admit date: 11/17/2020 Discharge date: 11/25/2020  Admitted From: HOME Disposition:  Home w/ home health.  Recommendations for Outpatient Follow-up:  Follow up with PCP and with cardiology in 1-2 weeks Please obtain BMP/CBC in one week Monitor blood pressure at home and follow-up with PCP  Home Health:YES  Equipment/Devices: NO  Discharge Condition: Stable Code Status:   Code Status: Full Code Diet recommendation:  Diet Order             Diet regular Room service appropriate? Yes; Fluid consistency: Thin  Diet effective now                   Brief/Interim Summary: 80 year old female with history of hypertension, PE, hypothyroidism, diabetes presented with shortness of breath over the last 3 days 4 days, worse with exertion, has not been taking her medication for last 2 weeks due to running out of them.  Also complaining of right knee pain after she had abnormal movement when she twisted her leg about 3 weeks ago.  Recently diagnosed with COVID a month ago when she had a fever and upper respiratory tract symptoms for 10 days.  In the ED, found to have blood pressure more than 200 systolic.  Patient was afebrile.  Chest x-ray was unremarkable.  Since patient's lower extremity was slightly swollen Dopplers were done which were negative for PE.  Labs show creatinine 1.05 hemoglobin 9.1 BNP of 258 high sensitive troponins were 16 and 18.  COVID test was positive.  Patient was given Lasix 40 mg IV and IV hydralazine.  Following which blood pressure improved and patient admitted for further observation Cardiology was consulted.Serial troponin 16>18>18>17.  Blood pressure stabilizing resume home meds.Still on oxygen and weaning down.  Patient had AKI with diuretics and ARB were held and placed on IV fluids with improvement. BP at this time has improved, continue current meds  follow-up with PCP and cardiology.  Plan of care discussed with patient's daughter over the phone.  Discharge Diagnoses:   Acute respiratory failure with hypoxemia  Cough Recent COVID-19 infection a month ago COPD bnp mildly up at 258 from 216. chest x-ray with stable cardiomegaly.  echocardiogram shows normal EF, G1DD.  Patient described her chest pain similar to her previous episode when she had PE-CT angio was done that showed no PE but scattered areas of mild peripheral groundglass likely infectious or inflammatory in etiology, suspect due to recent COVID.  Tried a dose of  ivLasix to help with oxygenation 9/8.  Continue supplemental oxygen, bronchodilators antitussives and oral steroid-short course-which she completed. Going home with oxygen.    Chest pain:Work-up with mildly positive troponin no delta,echocardiogram shows normal EF,G1DD.Seen by cards- and planning outpatient nuclear stress test.   Hypertension urgency in ED: BP now improving after resuming her meds with chlorthalidone, increase dose of hydralazine at 75 mg 3 times daily,irbesartan, amlodipine 10 mg .  Off carvedilol due to bradycardia.  Noted low diastolic blood pressures but likely spurious seen by cardiology and okay to discharge on current regimen.  Junctional bradycardia HR in 40s-Coreg has been held. Seen by Cardiology following-bradycardia resolved.coreg will not be resumed patient will need to follow-up with cardiology as outpatient   Hyponatremia: Likely from diuretics and chlorthalidone,sodium has improved with IV fluids .  back on chlorthalidone and sodium stable Recent Labs  Lab 11/20/20 0841 11/21/20 0150 11/21/20 1528 11/22/20 0326  11/23/20 0151  NA 129* 130* 129* 137 137     AKI on CKD stage IIIa:AKI likely multifactorial in the setting of uncontrolled hypertension with background diabetes, multiple meds with chlorthalidone/ARB at home also received dose of Lasix, IV contrast for CTA, w/ G1DD.AKI has  resolved, tolerating ARB, chlorthalidone diuretics. renal ultrasound unremarkable..creat at 0.9   Hyperkalemia: Due to AKI.  Resolved with Lokelma and Kayexalate.  On renal diet> changing to regular diet  Klebsiella pneumoniaeUTI : complaining of dysuria UA abnormal -ciprofloxacin for total 5 days   Right knee pain-x-ray showed osteoarthritis.     History of DVT/PE Dopplers negative for DVT.  On home xarelto low dose.   Chronic anemia likely from chronic kidney disease. Stable  Hypomagnesemia-repleted. HLD cont statin Hypothyroidism on Synthroid.   Diabetes mellitus type 2 in obese: Hypoglycemia 9/12 morning resolved with oral intake.  resume home meds on d/c. Recent Labs  Lab 11/24/20 1131 11/24/20 1611 11/24/20 2151 11/25/20 0727 11/25/20 1120  GLUCAP 227* 208* 177* 159* 169*    Thyroid nodules in CT: Ultrasound thyroid benign no need for biopsy.   Class II obesity with BMI 38.9: Will benefit with weight loss PCP follow-up healthy lifestyle  Consults: Nephro cardio  Subjective: Alert, awake resting comfortably no shortness of breath.  On 2 L nasal cannula blood pressure 1 50s- 170s hydralazine increased this morning.  Discharge Exam: Vitals:   11/25/20 1055 11/25/20 1142  BP: (!) 143/33 (!) 149/37  Pulse: 76 84  Resp: 16 16  Temp:  98.4 F (36.9 C)  SpO2:  97%   General: Pt is alert, awake, not in acute distress Cardiovascular: RRR, S1/S2 +, no rubs, no gallops Respiratory: CTA bilaterally, no wheezing, no rhonchi Abdominal: Soft, NT, ND, bowel sounds + Extremities: no edema, no cyanosis  Discharge Instructions  Discharge Instructions     Discharge instructions   Complete by: As directed    Monitor blood pressure at home notify MD if running above 160  Follow-up with your PCP in 1 week to adjust your medication  Please call call MD or return to ER for similar or worsening recurring problem that brought you to hospital or if any  fever,nausea/vomiting,abdominal pain, uncontrolled pain, chest pain,  shortness of breath or any other alarming symptoms.  Please follow-up your doctor as instructed in a week time and call the office for appointment.  Please avoid alcohol, smoking, or any other illicit substance and maintain healthy habits including taking your regular medications as prescribed.  You were cared for by a hospitalist during your hospital stay. If you have any questions about your discharge medications or the care you received while you were in the hospital after you are discharged, you can call the unit and ask to speak with the hospitalist on call if the hospitalist that took care of you is not available.  Once you are discharged, your primary care physician will handle any further medical issues. Please note that NO REFILLS for any discharge medications will be authorized once you are discharged, as it is imperative that you return to your primary care physician (or establish a relationship with a primary care physician if you do not have one) for your aftercare needs so that they can reassess your need for medications and monitor your lab values   Increase activity slowly   Complete by: As directed       Allergies as of 11/25/2020       Reactions   Penicillins Swelling, Rash  Did it involve swelling of the face/tongue/throat, SOB, or low BP? No Did it involve sudden or severe rash/hives, skin peeling, or any reaction on the inside of your mouth or nose? No Did you need to seek medical attention at a hospital or doctor's office? No When did it last happen?    childhood   If all above answers are "NO", may proceed with cephalosporin use.   Sulfa Drugs Cross Reactors Swelling, Rash        Medication List     STOP taking these medications    carvedilol 12.5 MG tablet Commonly known as: COREG   predniSONE 1 MG tablet Commonly known as: DELTASONE       TAKE these medications    acetaminophen  500 MG tablet Commonly known as: TYLENOL Take 1,000 mg by mouth every 6 (six) hours as needed for moderate pain or headache.   AIRBORNE PO Take 1 tablet by mouth daily as needed (cold symtoms).   albuterol 108 (90 Base) MCG/ACT inhaler Commonly known as: VENTOLIN HFA Inhale 2 puffs into the lungs every 4 (four) hours as needed. For shortness of breath.   amLODipine 10 MG tablet Commonly known as: NORVASC Take 1 tablet (10 mg total) by mouth daily.   atorvastatin 20 MG tablet Commonly known as: LIPITOR Take 20 mg by mouth daily.   budesonide-formoterol 80-4.5 MCG/ACT inhaler Commonly known as: SYMBICORT Inhale 2 puffs into the lungs 2 (two) times daily.   chlorthalidone 25 MG tablet Commonly known as: HYGROTON Take 1 tablet by mouth once daily   citalopram 20 MG tablet Commonly known as: CELEXA Take 20 mg by mouth daily.   DRY EYES OP Apply 1 drop to eye 2 (two) times daily as needed (dry eyes).   DULoxetine 60 MG capsule Commonly known as: CYMBALTA Take 60 mg by mouth daily.   fenofibrate 145 MG tablet Commonly known as: Tricor Take 1 tablet (145 mg total) by mouth daily.   fluticasone 50 MCG/ACT nasal spray Commonly known as: FLONASE Place 2 sprays into both nostrils daily as needed for allergies or rhinitis.   hydrALAZINE 25 MG tablet Commonly known as: APRESOLINE Take 3 tablets (75 mg total) by mouth every 8 (eight) hours. What changed:  how much to take when to take this   levothyroxine 25 MCG tablet Commonly known as: SYNTHROID TAKE 1 TABLET (25 MCG TOTAL) BY MOUTH DAILY. What changed: See the new instructions.   metFORMIN 500 MG 24 hr tablet Commonly known as: GLUCOPHAGE-XR Take 500 mg by mouth 2 (two) times daily before a meal.   OVER THE COUNTER MEDICATION Take 1 tablet by mouth daily. beets   potassium chloride 10 MEQ tablet Commonly known as: KLOR-CON Take 1 tablet by mouth once daily   traZODone 50 MG tablet Commonly known as:  DESYREL Take 50 mg by mouth at bedtime as needed for sleep.   valsartan 320 MG tablet Commonly known as: DIOVAN Take 1 tablet by mouth once daily   Xarelto 10 MG Tabs tablet Generic drug: rivaroxaban Take 1 tablet by mouth once daily What changed: how much to take               Durable Medical Equipment  (From admission, onward)           Start     Ordered   11/25/20 1047  For home use only DME oxygen  Once       Question Answer Comment  Length of Need Lifetime  Mode or (Route) Nasal cannula   Liters per Minute 2   Frequency Continuous (stationary and portable oxygen unit needed)   Oxygen delivery system Gas      11/25/20 1047            Follow-up Information     Laureen Ochs A, FNP Follow up in 1 week(s).   Specialty: Nurse Practitioner Contact information: [address] [place] [address] [phone]         Nahser, Deloris Ping, MD .   Specialty: Cardiology Contact information: [address] [place] [address] [phone]         [facility] Follow up.   Specialty: Home Health Services Why: [facility] will contact you to schedule your first home visit. Contact information: [address] [place] [address] [phone]                Allergies  Allergen Reactions   Penicillins Swelling and Rash    Did it involve swelling of the face/tongue/throat, SOB, or low BP? No Did it involve sudden or severe rash/hives, skin peeling, or any reaction on the inside of your mouth or nose? No Did you need to seek medical attention at a hospital or doctor's office? No When did it last happen?    childhood   If all above answers are "NO", may proceed with cephalosporin use.   Sulfa Drugs Cross Reactors Swelling and Rash    The results of significant diagnostics from this hospitalization (including imaging, microbiology, ancillary and laboratory) are listed below for  reference.    Microbiology: Recent Results (from the past 240 hour(s))  Resp Panel by RT-PCR (Flu A&B, Covid) Nasopharyngeal Swab     Status: Abnormal   Collection Time: 11/17/20  4:05 PM   Specimen: Nasopharyngeal Swab; Nasopharyngeal(NP) swabs in vial transport medium  Result Value Ref Range Status   SARS Coronavirus 2 by RT PCR POSITIVE (A) NEGATIVE Final    Comment: RESULT CALLED TO, READ BACK BY AND VERIFIED WITH: [place] NICKERSON RN 11/17/20  BY JW (NOTE) SARS-CoV-2 target nucleic acids are DETECTED.  The SARS-CoV-2 RNA is generally detectable in upper respiratory specimens during the acute phase of infection. Positive results are indicative of the presence of the identified virus, but do not rule out bacterial infection or co-infection with other pathogens not detected by the test. Clinical correlation with patient history and other diagnostic information is necessary to determine patient infection status. The expected result is Negative.  Fact Sheet for Patients: BloggerCourse.com  Fact Sheet for Healthcare Providers: SeriousBroker.it  This test is not yet approved or cleared by the [place] FDA and  has been authorized for detection and/or diagnosis of SARS-CoV-2 by FDA under an Emergency Use Authorization (EUA).  This EUA will remain in effect (meaning this test c an be used) for the duration of  the COVID-19 declaration under Section 564(b)(1) of the Act, 21 U.S.C. section 360bbb-3(b)(1), unless the authorization is terminated or revoked sooner.     Influenza A by PCR NEGATIVE NEGATIVE Final   Influenza B by PCR NEGATIVE NEGATIVE Final    Comment: (NOTE) The Xpert Xpress SARS-CoV-2/FLU/RSV plus assay is intended as an aid in the diagnosis of influenza from Nasopharyngeal swab specimens and should not be used as a sole basis for treatment. Nasal washings and aspirates are unacceptable for Xpert Xpress  SARS-CoV-2/FLU/RSV testing.  Fact Sheet for Patients: BloggerCourse.com  Fact Sheet for  Healthcare Providers: SeriousBroker.it  This test is not yet approved or cleared by the [place] and has been authorized for detection and/or diagnosis of SARS-CoV-2 by FDA under an Emergency Use Authorization (EUA). This EUA will remain in effect (meaning this test can be used) for the duration of the COVID-19 declaration under Section 564(b)(1) of the Act, 21 U.S.C. section 360bbb-3(b)(1), unless the authorization is terminated or revoked.  Performed at [facility], [address] [place], [place] [address]   Urine Culture     Status: Abnormal   Collection Time: 11/18/20  4:37 PM   Specimen: Urine, Clean Catch  Result Value Ref Range Status   Specimen Description URINE, CLEAN CATCH  Final   Special Requests   Final    NONE Performed at [facility], [address] [place], [place] [address]    Culture >=100,000 COLONIES/mL KLEBSIELLA PNEUMONIAE (A)  Final   Report Status 11/21/2020 FINAL  Final   Organism ID, Bacteria KLEBSIELLA PNEUMONIAE (A)  Final      Susceptibility   Klebsiella pneumoniae - MIC*    AMPICILLIN >=32 RESISTANT Resistant     CEFAZOLIN <=4 SENSITIVE Sensitive     CEFEPIME <=0.12 SENSITIVE Sensitive     CEFTRIAXONE <=0.25 SENSITIVE Sensitive     CIPROFLOXACIN <=0.25 SENSITIVE Sensitive     GENTAMICIN <=1 SENSITIVE Sensitive     IMIPENEM 1 SENSITIVE Sensitive     NITROFURANTOIN 64 INTERMEDIATE Intermediate     TRIMETH/SULFA <=20 SENSITIVE Sensitive     AMPICILLIN/SULBACTAM <=2 SENSITIVE Sensitive     PIP/TAZO <=4 SENSITIVE Sensitive     * >=100,000 COLONIES/mL KLEBSIELLA PNEUMONIAE    Procedures/Studies: CT Angio Chest Pulmonary Embolism (PE) W or WO Contrast  Addendum Date: 11/18/2020   ADDENDUM REPORT: 11/18/2020 13:20 ADDENDUM: The following was discussed in the body of the report but not  included in the impression section: Scattered areas of mild peripheral ground-glass are likely infectious or inflammatory in etiology. Electronically Signed   By: Leanna Battles M.D.   On: 11/18/2020 13:20   Result Date: 11/18/2020 CLINICAL DATA:  Chest pain, shortness of breath, hypoxia. EXAM: CT ANGIOGRAPHY CHEST WITH CONTRAST TECHNIQUE: Multidetector CT imaging of the chest was performed using the standard protocol during bolus administration of intravenous contrast. Multiplanar CT image reconstructions and MIPs were obtained to evaluate the vascular anatomy. CONTRAST:  64mL OMNIPAQUE IOHEXOL 350 MG/ML SOLN COMPARISON:  05/10/2019. FINDINGS: Cardiovascular: Negative for pulmonary embolus. Atherosclerotic calcification of the aorta and coronary arteries. Heart is enlarged. No pericardial effusion. Mediastinum/Nodes: Low-attenuation lesions in the thyroid measure up to 1.6 cm on the right. No pathologically enlarged mediastinal, hilar or axillary lymph nodes. Esophagus is grossly unremarkable. Lungs/Pleura: Image quality is degraded by expiratory phase imaging. There are areas of more focal peripheral patchy ground-glass bilaterally, not thought to be due to expiratory phase imaging. Calcified granulomas. Mild dependent atelectasis, right greater than left. No pleural fluid. Airway is unremarkable. Upper Abdomen: There may be a subcentimeter low-attenuation lesion in the dome of the liver, too small to characterize. Visualized portions of the liver, gallbladder, adrenal glands, right kidney, spleen and pancreas are otherwise unremarkable. Proximal gastric wall thickening. Upper abdominal lymph nodes are not enlarged by CT size criteria. Musculoskeletal: Degenerative changes in the spine. No worrisome lytic or sclerotic lesions. Review of the MIP images confirms the above findings. IMPRESSION: 1. Negative for pulmonary embolus. 2. 1.6 cm right thyroid nodule. Recommend thyroid ultrasound. (Ref: J Am Coll Radiol.  2015 Feb;12(2): 143-50). 3. Proximal gastric wall thickening can be seen with gastritis. 4. Aortic atherosclerosis (ICD10-I70.0). Coronary artery calcification. Electronically Signed: By: Leanna Battles M.D. On: 11/18/2020 13:08   US RENAL  Result Date: 11/21/2020 CLINICAL DATA:  Acute kidney injury. EXAM: RENAL / URINARY TRACT ULTRASOUND COMPLETE COMPARISON:  Abdomen CT dated 05/10/2019 FINDINGS: Right Kidney: Renal measurements: 9.7 x 4.9 x 4.6 cm = volume: 114 mL. Echogenicity within normal limits. No mass or hydronephrosis visualized. Left Kidney: Renal measurements: 9.7 x 5.0 x 4.9 cm = volume: 124 mL. Normal echotexture. 1.9 cm simple appearing cyst in the upper pole. No hydronephrosis. Bladder: Small amount of dependent debris. Other: None. IMPRESSION: 1. No acute abnormality. 2. Small amount of dependent debris in the urinary bladder. Electronically Signed   By: Beckie Salts M.D.   On: 11/21/2020 15:41   DG Chest Port 1 View  Result Date: 11/17/2020 CLINICAL DATA:  Shortness of breath EXAM: PORTABLE CHEST 1 VIEW COMPARISON:  Chest radiograph 05/13/2019 FINDINGS: The heart is mildly enlarged, unchanged. The mediastinal contours are stable. There is no focal consolidation or pulmonary edema. There is no pleural effusion or pneumothorax. There is no acute osseous abnormality. IMPRESSION: Stable cardiomegaly. Otherwise, no radiographic evidence of acute cardiopulmonary process. Electronically Signed   By: Lesia Hausen M.D.   On: 11/17/2020 16:31   DG Knee Complete 4 Views Right  Result Date: 11/17/2020 CLINICAL DATA:  Knee pain EXAM: RIGHT KNEE - COMPLETE 4+ VIEW COMPARISON:  None. FINDINGS: Small suprapatellar joint effusion. No acute fracture or dislocation identified. There is sharpening of the tibial spines. Patellofemoral and medial compartment joint space narrowing is identified. IMPRESSION: 1. Small suprapatellar joint effusion. 2. Patellofemoral and medial compartment osteoarthritis.  Electronically Signed   By: Signa Kell M.D.   On: 11/17/2020 17:54   ECHOCARDIOGRAM COMPLETE  Result Date: 11/18/2020    ECHOCARDIOGRAM REPORT   Patient Name:   Virginia Summers Tesar Date of Exam: 11/18/2020 Medical Rec #:  [id number]    Height:       59.0 in Accession #:    [id number]   Weight:       190.0 lb Date of Birth:  Jan 22, 1941   BSA:          1.805 m Patient Age:    79 years     BP:           163/66 mmHg Patient Gender: F            HR:           78 bpm. Exam Location:  Inpatient Procedure: 2D Echo, Cardiac Doppler and Color Doppler Indications:    R07.9* Chest pain, unspecified  History:        Patient has prior history of Echocardiogram examinations, most                 recent 05/13/2019. Pulmonary HTN and COPD; Risk                 Factors:Hypertension.  Sonographer:    MH Referring Phys: [id number] ARSHAD N KAKRAKANDY IMPRESSIONS  1. Left ventricular ejection fraction, by estimation, is 60 to 65%. The left ventricle has normal function. Left ventricular endocardial border not optimally defined to evaluate regional wall motion. Left ventricular diastolic parameters are consistent with Grade I diastolic dysfunction (impaired relaxation). Elevated left ventricular end-diastolic pressure.  2. Right ventricular systolic function is normal. The right ventricular size is normal. There is normal pulmonary artery systolic pressure.  The estimated right ventricular systolic pressure is 29.0 mmHg.  3. The pericardial effusion is posterior to the left ventricle.  4. The mitral valve is normal in structure. No evidence of mitral valve regurgitation. No evidence of mitral stenosis.  5. The aortic valve is normal in structure. Aortic valve regurgitation is not visualized. No aortic stenosis is present.  6. The inferior vena cava is normal in size with greater than 50% respiratory variability, suggesting right atrial pressure of 3 mmHg. FINDINGS  Left Ventricle: Left ventricular ejection fraction, by estimation, is 60 to 65%. The  left ventricle has normal function. Left ventricular endocardial border not optimally defined to evaluate regional wall motion. The left ventricular internal cavity size was normal in size. There is no left ventricular hypertrophy. Left ventricular diastolic parameters are consistent with Grade I diastolic dysfunction (impaired relaxation). Elevated left ventricular end-diastolic pressure. Right Ventricle: The right ventricular size is normal. No increase in right ventricular wall thickness. Right ventricular systolic function is normal. There is normal pulmonary artery systolic pressure. The tricuspid regurgitant velocity is 2.55 m/s, and  with an assumed right atrial pressure of 3 mmHg, the estimated right ventricular systolic pressure is 29.0 mmHg. Left Atrium: Left atrial size was normal in size. Right Atrium: Right atrial size was normal in size. Pericardium: Trivial pericardial effusion is present. The pericardial effusion is posterior to the left ventricle. Mitral Valve: The mitral valve is normal in structure. No evidence of mitral valve regurgitation. No evidence of mitral valve stenosis. Tricuspid Valve: The tricuspid valve is normal in structure. Tricuspid valve regurgitation is trivial. No evidence of tricuspid stenosis. Aortic Valve: The aortic valve is normal in structure. Aortic valve regurgitation is not visualized. No aortic stenosis is present. Aortic valve mean gradient measures 8.0 mmHg. Aortic valve peak gradient measures 11.7 mmHg. Aortic valve area, by VTI measures 1.73 cm. Pulmonic Valve: The pulmonic valve was normal in structure. Pulmonic valve regurgitation is not visualized. No evidence of pulmonic stenosis. Aorta: The aortic root is normal in size and structure. Venous: The inferior vena cava is normal in size with greater than 50% respiratory variability, suggesting right atrial pressure of 3 mmHg. IAS/Shunts: The interatrial septum appears to be lipomatous. No atrial level shunt  detected by color flow Doppler.  LEFT VENTRICLE PLAX 2D LVIDd:         3.90 cm     Diastology LVIDs:         2.80 cm     LV e' medial:    4.79 cm/s LV PW:         2.20 cm     LV E/e' medial:  16.5 LV IVS:        2.00 cm     LV e' lateral:   6.74 cm/s LVOT diam:     1.50 cm     LV E/e' lateral: 11.8 LV SV:         62 LV SV Index:   34 LVOT Area:     1.77 cm  LV Volumes (MOD) LV vol d, MOD A4C: 54.4 ml LV vol s, MOD A4C: 20.8 ml LV SV MOD A4C:     54.4 ml RIGHT VENTRICLE             IVC RV S prime:     10.70 cm/s  IVC diam: 1.20 cm TAPSE (M-mode): 2.6 cm LEFT ATRIUM             Index  RIGHT ATRIUM           Index LA diam:        3.10 cm 1.72 cm/m  RA Area:     11.10 cm LA Vol (A2C):   17.6 ml 9.75 ml/m  RA Volume:   21.20 ml  11.75 ml/m LA Vol (A4C):   30.4 ml 16.85 ml/m LA Biplane Vol: 24.2 ml 13.41 ml/m  AORTIC VALVE                    PULMONIC VALVE AV Area (Vmax):    1.66 cm     PV Vmax:       1.13 m/s AV Area (Vmean):   1.51 cm     PV Peak grad:  5.1 mmHg AV Area (VTI):     1.73 cm AV Vmax:           171.00 cm/s AV Vmean:          137.000 cm/s AV VTI:            0.357 m AV Peak Grad:      11.7 mmHg AV Mean Grad:      8.0 mmHg LVOT Vmax:         161.00 cm/s LVOT Vmean:        117.000 cm/s LVOT VTI:          0.349 m LVOT/AV VTI ratio: 0.98  AORTA Ao Root diam: 2.80 cm Ao Asc diam:  2.95 cm MITRAL VALVE                TRICUSPID VALVE MV Area (PHT): 3.33 cm     TR Peak grad:   26.0 mmHg MV E velocity: 79.20 cm/s   TR Vmax:        255.00 cm/s MV A velocity: 115.00 cm/s MV E/A ratio:  0.69         SHUNTS                             Systemic VTI:  0.35 m                             Systemic Diam: 1.50 cm Armanda Magic MD Electronically signed by Armanda Magic MD Signature Date/Time: 11/18/2020/2:46:40 PM    Final    US THYROID  Result Date: 11/19/2020 CLINICAL DATA:  Nodule EXAM: THYROID ULTRASOUND TECHNIQUE: Ultrasound examination of the thyroid gland and adjacent soft tissues was performed. COMPARISON:   None. FINDINGS: Parenchymal Echotexture: Mildly heterogeneous Isthmus: 0.3 cm Right lobe: 2.3 x 1.6 x 1.6 cm Left lobe: 3.2 x 1.7 x 1.5 cm _________________________________________________________ Estimated total number of nodules >/= 1 cm: 1 Number of spongiform nodules >/=  2 cm not described below (TR1): 0 Number of mixed cystic and solid nodules >/= 1.5 cm not described below (TR2): 0 _________________________________________________________ 1.6 x 1.4 x 1.2 cm mixed solid cystic isoechoic nodule in the mid right thyroid lobe does not meet criteria for imaging surveillance or FNA. IMPRESSION: Solitary, mixed solid cystic, isoechoic right thyroid nodule does not meet criteria for FNA or imaging surveillance. The above is in keeping with the ACR TI-RADS recommendations - J Am Coll Radiol 2017;14:587-595. Electronically Signed   By: Acquanetta Belling M.D.   On: 11/19/2020 07:34   VAS [place] LOWER EXTREMITY VENOUS (DVT) (ONLY [facility] & [facility] 7a-7p)  Result Date: 11/17/2020  Lower  Venous DVT Study Patient Name:  Virginia Summers  DatKERSTIN CRUSOE:   11/17/2020 Medical Rec #: [id number]     Accession #:    [id number] Date of Birth: 1940-04-06    Patient Gender: F Patient Age:   40 years Exam Location:  [facility] Procedure:      VAS [place] LOWER EXTREMITY VENOUS (DVT) Referring Phys: JOSHUA LONG --------------------------------------------------------------------------------  Indications: Pain, and Edema.  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 Limitations: Body habitus and poor ultrasound/tissue interface. Comparison Study: Previous exam 05/26/2010 Positive for DVT RLE - PTV & LLE - PTV                   and PopV Performing Technologist: Jody Hill RVT, RDMS  Examination Guidelines: A complete evaluation includes B-mode imaging, spectral Doppler, color Doppler, and power Doppler as needed of all accessible portions of each vessel. Bilateral testing is considered an integral part of a complete examination. Limited  examinations for reoccurring indications may be performed as noted. The reflux portion of the exam is performed with the patient in reverse Trendelenburg.  +---------+---------------+---------+-----------+----------+-------------------+ RIGHT    CompressibilityPhasicitySpontaneityPropertiesThrombus Aging      +---------+---------------+---------+-----------+----------+-------------------+ CFV      Full           Yes      Yes                                      +---------+---------------+---------+-----------+----------+-------------------+ SFJ      Full                                                             +---------+---------------+---------+-----------+----------+-------------------+ FV Prox  Full           Yes      Yes                                      +---------+---------------+---------+-----------+----------+-------------------+ FV Mid   Full           Yes      Yes                                      +---------+---------------+---------+-----------+----------+-------------------+ FV DistalFull           Yes      Yes                                      +---------+---------------+---------+-----------+----------+-------------------+ PFV      Full                                                             +---------+---------------+---------+-----------+----------+-------------------+ POP      Full           Yes  Yes                                      +---------+---------------+---------+-----------+----------+-------------------+ PTV      Full                                         Not well visualized +---------+---------------+---------+-----------+----------+-------------------+ PERO     Full                                         Not well visualized +---------+---------------+---------+-----------+----------+-------------------+   +----+---------------+---------+-----------+----------+--------------+  LEFTCompressibilityPhasicitySpontaneityPropertiesThrombus Aging +----+---------------+---------+-----------+----------+--------------+ CFV Full           Yes      Yes                                 +----+---------------+---------+-----------+----------+--------------+     Summary: RIGHT: - There is no evidence of deep vein thrombosis in the lower extremity. - There is no evidence of superficial venous thrombosis.  - A cystic structure is found in the popliteal fossa. Subcutaneous edema in calf area.  LEFT: - No evidence of common femoral vein obstruction.  *See table(s) above for measurements and observations. Electronically signed by Coral Else MD on 11/17/2020 at 7:18:44 PM.    Final     Labs: BNP (last 3 results) Recent Labs    11/17/20 1635  BNP 258.7*   Basic Metabolic Panel: Recent Labs  Lab 11/20/20 0841 11/21/20 0150 11/21/20 1528 11/22/20 0326 11/23/20 0151 11/23/20 1149  NA 129* 130* 129* 137 137  --   K 4.5 5.4* 5.8* 3.6 3.6  --   CL 93* 95* 97* 104 98  --   CO2 30 25 24 26 27   --   GLUCOSE 205* 152* 239* 149* 169* 273*  BUN 42* 61* 57* 46* 27*  --   CREATININE 1.83* 2.84* 1.94* 1.27* 0.96  --   CALCIUM 8.5* 8.4* 8.4* 8.3* 8.4*  --    Liver Function Tests: Recent Labs  Lab 11/21/20 0825  AST 22  ALT 14  ALKPHOS 41  BILITOT 0.4  PROT 6.0*  ALBUMIN 3.1*   No results for input(s): LIPASE, AMYLASE in the last 168 hours.  No results for input(s): AMMONIA in the last 168 hours. CBC: Recent Labs  Lab 11/21/20 0150 11/22/20 0326 11/23/20 0151  WBC 8.1 10.8* 10.3  HGB 9.6* 9.8* 9.7*  HCT 31.6* 32.0* 31.2*  MCV 86.1 84.4 85.2  PLT 246 239 247   Cardiac Enzymes: No results for input(s): CKTOTAL, CKMB, CKMBINDEX, TROPONINI in the last 168 hours. BNP: Invalid input(s): POCBNP CBG: Recent Labs  Lab 11/24/20 1131 11/24/20 1611 11/24/20 2151 11/25/20 0727 11/25/20 1120  GLUCAP 227* 208* 177* 159* 169*   D-Dimer No results for input(s):  DDIMER in the last 72 hours. Hgb A1c No results for input(s): HGBA1C in the last 72 hours. Lipid Profile No results for input(s): CHOL, HDL, LDLCALC, TRIG, CHOLHDL, LDLDIRECT in the last 72 hours. Thyroid function studies No results for input(s): TSH, T4TOTAL, T3FREE, THYROIDAB in the last 72 hours.  Invalid input(s): FREET3 Anemia work up No results for input(s): VITAMINB12, FOLATE, FERRITIN, TIBC,  IRON, RETICCTPCT in the last 72 hours. Urinalysis    Component Value Date/Time   COLORURINE YELLOW 11/18/2020 1820   APPEARANCEUR TURBID (A) 11/18/2020 1820   LABSPEC 1.039 (H) 11/18/2020 1820   PHURINE 6.0 11/18/2020 1820   GLUCOSEU NEGATIVE 11/18/2020 1820   HGBUR NEGATIVE 11/18/2020 1820   BILIRUBINUR NEGATIVE 11/18/2020 1820   KETONESUR NEGATIVE 11/18/2020 1820   PROTEINUR 30 (A) 11/18/2020 1820   NITRITE NEGATIVE 11/18/2020 1820   LEUKOCYTESUR LARGE (A) 11/18/2020 1820   Sepsis Labs Invalid input(s): PROCALCITONIN,  WBC,  LACTICIDVEN Microbiology Recent Results (from the past 240 hour(s))  Resp Panel by RT-PCR (Flu A&B, Covid) Nasopharyngeal Swab     Status: Abnormal   Collection Time: 11/17/20  4:05 PM   Specimen: Nasopharyngeal Swab; Nasopharyngeal(NP) swabs in vial transport medium  Result Value Ref Range Status   SARS Coronavirus 2 by RT PCR POSITIVE (A) NEGATIVE Final    Comment: RESULT CALLED TO, READ BACK BY AND VERIFIED WITH: [place] NICKERSON RN 11/17/20 @1642  BY JW (NOTE) SARS-CoV-2 target nucleic acids are DETECTED.  The SARS-CoV-2 RNA is generally detectable in upper respiratory specimens during the acute phase of infection. Positive results are indicative of the presence of the identified virus, but do not rule out bacterial infection or co-infection with other pathogens not detected by the test. Clinical correlation with patient history and other diagnostic information is necessary to determine patient infection status. The expected result is Negative.  Fact  Sheet for Patients:  Fact Sheet for Healthcare Providers: BloggerCourse.com  This test is not yet approved or cleared by the [employer]ious[employer] FDA and  has been authorized for detection and/or diagnosis of SARS-CoV-2 by FDA under an Emergency Use Authorization (EUA).  This EUA will remain in effect (meaning this test c an be used) for the duration of  the COVID-19 declaration under Section 564(b)(1) of the Act, 21 U.S.C. section 360bbb-3(b)(1), unless the authorization is terminated or revoked sooner.     Influenza A by PCR NEGATIVE NEGATIVE Final   Influenza B by PCR NEGATIVE NEGATIVE Final    Comment: (NOTE) The Xpert Xpress SARS-CoV-2/FLU/RSV plus assay is intended as an aid in the diagnosis of influenza from Nasopharyngeal swab specimens and should not be used as a sole basis for treatment. Nasal washings and aspirates are unacceptable for Xpert Xpress SARS-CoV-2/FLU/RSV testing.  Fact Sheet for Patients: [place]  Fact Sheet for Healthcare Providers: BloggerCourse.com  This test is not yet approved or cleared by the [employer]ious[employer].it FDA and has been authorized for detection and/or diagnosis of SARS-CoV-2 by FDA under an Emergency Use Authorization (EUA). This EUA will remain in effect (meaning this test can be used) for the duration of the COVID-19 declaration under Section 564(b)(1) of the Act, 21 U.S.C. section 360bbb-3(b)(1), unless the authorization is terminated or revoked.  Performed at [facility], [address], [place], [place]   Urine Culture     Status: Abnormal   Collection Time: 11/18/20  4:37 PM   Specimen: Urine, Clean Catch  Result Value Ref Range Status   Specimen Description URINE, CLEAN CATCH  Final   Special Requests   Final    NONE Performed at [facility], [address], [place], [place]     Culture >=100,000 COLONIES/mL KLEBSIELLA PNEUMONIAE (A)  Final   Report Status 11/21/2020 FINAL  Final   Organism ID, Bacteria KLEBSIELLA PNEUMONIAE (A)  Final      Susceptibility   Klebsiella  pneumoniae - MIC*    AMPICILLIN >=32 RESISTANT Resistant     CEFAZOLIN <=4 SENSITIVE Sensitive     CEFEPIME <=0.12 SENSITIVE Sensitive     CEFTRIAXONE <=0.25 SENSITIVE Sensitive     CIPROFLOXACIN <=0.25 SENSITIVE Sensitive     GENTAMICIN <=1 SENSITIVE Sensitive     IMIPENEM 1 SENSITIVE Sensitive     NITROFURANTOIN 64 INTERMEDIATE Intermediate     TRIMETH/SULFA <=20 SENSITIVE Sensitive     AMPICILLIN/SULBACTAM <=2 SENSITIVE Sensitive     PIP/TAZO <=4 SENSITIVE Sensitive     * >=100,000 COLONIES/mL KLEBSIELLA PNEUMONIAE     Time coordinating discharge: 35 minutes  SIGNED: Lanae Boast, MD  [facility] 11/25/2020, 12:50 PM  If 7PM-7AM, please contact night-coverage [email or web address]

## 2020-11-24 NOTE — Progress Notes (Signed)
Inpatient Diabetes Program Recommendations  AACE/ADA: New Consensus Statement on Inpatient Glycemic Control   Target Ranges:  Prepandial:   less than 140 mg/dL      Peak postprandial:   less than 180 mg/dL (1-2 hours)      Critically ill patients:  140 - 180 mg/dL  Results for SALONI, LABLANC (MRN [id number]) as of 11/24/2020 08:22  Ref. Range 11/24/2020 07:50  Glucose-Capillary Latest Ref Range: 70 - 99 mg/dL 992 (H)    Results for QUIN, MCPHERSON (MRN [id number]) as of 11/24/2020 08:22  Ref. Range 11/23/2020 07:53 11/23/2020 11:30 11/23/2020 11:37 11/23/2020 16:28 11/23/2020 20:48  Glucose-Capillary Latest Ref Range: 70 - 99 mg/dL 222 (H) 39 (LL) 979 (H) 208 (H) 203 (H)   Review of Glycemic Control  Diabetes history: DM2 Outpatient Diabetes medications: Metformin XR 500 mg BID Current orders for Inpatient glycemic control: None   Inpatient Diabetes Program Recommendations:     Insulin: Noted hypoglycemia on 11/23/20 and Semglee and Novolog correction was discontinued on 11/23/20. Please consider reordering Novolog 0-9 units TID with meals and Novolog 0-5 units QHS.   Thanks, Orlando Penner, RN, MSN, CDE Diabetes Coordinator Inpatient Diabetes Program [phone] (Team Pager from 8am to 5pm)

## 2020-11-24 NOTE — Progress Notes (Signed)
PROGRESS NOTE    Virginia Summers  FAO:[id number] DOB: 05-20-1940 DOA: 11/17/2020 PCP: Eather Colas, FNP   Chief Complaint  Patient presents with   Shortness of Breath   Chest Pain    Brief Narrative:  80 year old female with history of hypertension, PE, hypothyroidism, diabetes presented with shortness of breath over the last 3 days 4 days, worse with exertion, has not been taking her medication for last 2 weeks due to running out of them.  Also complaining of right knee pain after she had abnormal movement when she twisted her leg about 3 weeks ago.  Recently diagnosed with COVID a month ago when she had a fever and upper respiratory tract symptoms for 10 days.  In the ED, found to have blood pressure more than 200 systolic.  Patient was afebrile.  Chest x-ray was unremarkable.  Since patient's lower extremity was slightly swollen Dopplers were done which were negative for PE.  Labs show creatinine 1.05 hemoglobin 9.1 BNP of 258 high sensitive troponins were 16 and 18.  COVID test was positive.  Patient was given Lasix 40 mg IV and IV hydralazine.  Following which blood pressure improved and patient admitted for further observation Cardiology was consulted.Serial troponin 16>18>18>17.  Blood pressure stabilizing resume home meds.Still on oxygen and weaning down.  Patient had AKI with diuretics and ARB were held and placed on IV fluids with improvement  Subjective: Seen and examined this morning.  Complains of swelling of extremities, also wheezing this morning.  Feels queasy but reports she feels somewhat better compared to before   Assessment & Plan:  Acute respiratory failure with hypoxemia  Cough Recent COVID-19 infection a month ago COPD bnp mildly up at 258 from 216. chest x-ray with stable cardiomegaly.  echocardiogram shows normal EF, G1DD.  Patient described her chest pain similar to her previous episode when she had PE-CT angio was done that showed no PE but scattered areas of  mild peripheral groundglass likely infectious or inflammatory in etiology, suspect due to recent COVID.  Tried a dose of  ivLasix to help with oxygenation 9/8.  Continue supplemental oxygen, bronchodilators antitussives and oral steroid-short course-which she completed.  May need to go home with oxygen-eval for home oxygen.  Chest pain:Work-up with mildly positive troponin no delta,echocardiogram shows normal EF,G1DD.Seen by cards- and planning outpatient nuclear stress test.  Hypertension urgency in ED: BP now improving after resuming her meds with chlorthalidone, increase dose of hydralazine, irbesartan also initiated on 5 mg amlodipine-increased to 10 mg this morning. But blood pressure remains poorly controlled.  Has edematous extremities and wheezing will give 20 mg IV Lasix low-dose.  Edematous extremities and also wheezing will order  20 mg Lasix x1  Junctional bradycardia heart rate in 40s-Coreg has been held.  Cardiology following-heart rate has been resolved, plan is not to resume beta-blocker.   Hyponatremia: Likely from diuretics and chlorthalidone, sodium has improved with IV fluids- off ivf. .  Monitor while on chlorthalidone. Recent Labs  Lab 11/20/20 0841 11/21/20 0150 11/21/20 1528 11/22/20 0326 11/23/20 0151  NA 129* 130* 129* 137 137   AKI on CKD stage IIIa: AKI likely multifactorial in the setting of uncontrolled hypertension with background diabetes, multiple meds with chlorthalidone/ARB at home also received dose of Lasix, IV contrast for CTA.renal function improved stable continue chlorthalidone and ARB's.  Patient complains of swelling we will hold giving a dose of low-dose Lasix x1.   Recent Labs  Lab 11/20/20 0841 11/21/20 0150 11/21/20 1528  11/22/20 0326 11/23/20 0151  BUN 42* 61* 57* 46* 27*  CREATININE 1.83* 2.84* 1.94* 1.27* 0.96   No intake or output data in the 24 hours ending 11/24/20 1402   Hyperkalemia: resolved Recent Labs  Lab 11/20/20 0841  11/21/20 0150 11/21/20 1528 11/22/20 0326 11/23/20 0151  K 4.5 5.4* 5.8* 3.6 3.6     Klebsiella pneumoniaeUTI : complaining of dysuria UA abnormal -completed ciprofloxacin for total 5 days  Right knee pain-x-ray showed osteoarthritis.    History of DVT/PE Dopplers negative for DVT. On home xarelto low dose.  Chronic anemia likely from chronic kidney disease. Stable.  Monitor intermittently Recent Labs  Lab 11/17/20 1553 11/21/20 0150 11/22/20 0326 11/23/20 0151  HGB 11.1* 9.6* 9.8* 9.7*  HCT 36.3 31.6* 32.0* 31.2*     Hypomagnesemia-repleted. HLD cont statin Hypothyroidism on Synthroid.  Diabetes mellitus type 2 in obese: Hypoglycemia  9/11  morning resolved with oral intake.  Discontinued Lantus and sliding scale for now.  Add SSI TID AC meal. Recent Labs  Lab 11/23/20 1137 11/23/20 1628 11/23/20 2048 11/24/20 0750 11/24/20 1131  GLUCAP 269* 208* 203* 166* 227*   Thyroid nodules in CT: Ultrasound thyroid benign no need for biopsy.  Class II obesity with BMI 38.9: Will benefit with weight loss PCP follow-up healthy lifestyle  Diet Order             Diet regular Room service appropriate? Yes; Fluid consistency: Thin  Diet effective now                    Patient's Body mass index is 41.77 kg/m.  DVT prophylaxis: rivaroxaban (XARELTO) tablet 10 mg Start: 11/18/20 1000 Code Status:   Code Status: Full Code  Family Communication: plan of care discussed with patient and her daughter at bedsidel.  Transfer to progressive care unit  given her bradycardia AKI hypoxia.  Status is: inpatient Remains hospitalized for ongoing management Dispo: The patient is from: Home              Anticipated d/c is to: Home hopefully next 24 hours once blood pressure, swelling is stabilized.                Patient currently is not medically stable to d/c.   Difficult to place patient No  Unresulted Labs (From admission, onward)     Start     Ordered   11/21/20 0815   Urinalysis, Routine w reflex microscopic Urine, Clean Catch  ONCE - STAT,   STAT        11/21/20 0814           Medications reviewed:  Scheduled Meds:  amLODipine  10 mg Oral Daily   atorvastatin  20 mg Oral Daily   chlorthalidone  25 mg Oral Daily   DULoxetine  30 mg Oral Daily   fenofibrate  160 mg Oral Daily   fluticasone  2 spray Each Nare Daily   furosemide  20 mg Intravenous Once   hydrALAZINE  50 mg Oral Q8H   irbesartan  300 mg Oral Daily   levothyroxine  25 mcg Oral Q0600   melatonin  3 mg Oral QHS   mometasone-formoterol  2 puff Inhalation BID   rivaroxaban  10 mg Oral Daily   Continuous Infusions:   Consultants:see note  Procedures:see note Antimicrobials: Anti-infectives (From admission, onward)    Start     Dose/Rate Route Frequency Ordered Stop   11/22/20 0915  ciprofloxacin (CIPRO) tablet 500 mg  500 mg Oral 2 times daily 11/22/20 0827 11/23/20 2120   11/21/20 0434  ciprofloxacin (CIPRO) tablet 250 mg       Note to Pharmacy: UTI   250 mg Oral Daily with breakfast 11/21/20 0435 11/21/20 0843   11/18/20 2100  ciprofloxacin (CIPRO) tablet 500 mg  Status:  Discontinued       Note to Pharmacy: UTI   500 mg Oral 2 times daily 11/18/20 2008 11/21/20 0435   11/18/20 0000  doxycycline (DORYX) 100 MG EC tablet        100 mg Oral 2 times daily 11/18/20 1410 11/25/20 2359      Culture/Microbiology    Component Value Date/Time   SDES URINE, CLEAN CATCH 11/18/2020 1637   SPECREQUEST  11/18/2020 1637    NONE Performed at [facility], [address], [place], [place] [address]    CULT >=100,000 COLONIES/mL KLEBSIELLA PNEUMONIAE (A) 11/18/2020 1637   REPTSTATUS 11/21/2020 FINAL 11/18/2020 1637    Other culture-see note  Objective: Vitals: Today's Vitals   11/24/20 0928 11/24/20 1134 11/24/20 1201 11/24/20 1208  BP: (!) 168/39 (!) 185/47  (!) 163/44  Pulse: 78 82 86   Resp: 14 19    Temp:  98.4 F (36.9 C)    TempSrc:  Oral    SpO2: 96%  96% 96%   Weight:      Height:      PainSc:       No intake or output data in the 24 hours ending 11/24/20 1402  Filed Weights   11/19/20 0500 11/22/20 0404  Weight: 87.4 kg 93.8 kg   Weight change:   Intake/Output from previous day: 09/12 0701 - 09/13 0700 In: 100 [P.O.:100] Out: -  Intake/Output this shift: No intake/output data recorded. Filed Weights   11/19/20 0500 11/22/20 0404  Weight: 87.4 kg 93.8 kg    Examination: General exam: AAOx 3, obese, hard of hearing older than stated age, weak appearing. HEENT:Oral mucosa moist, Ear/Nose WNL grossly, dentition normal. Respiratory system: bilaterally expiratory wheezing present, no use of accessory muscle Cardiovascular system: S1 & S2 +, No JVD,. Gastrointestinal system: Abdomen soft, obese NT,ND, BS+ Nervous System:Alert, awake, moving extremities and grossly nonfocal Extremities: Edematous extremities nonpitting and also hand Skin: No rashes,no icterus. MSK: Normal muscle bulk,tone, power     Data Reviewed: I have personally reviewed following labs and imaging studies CBC: Recent Labs  Lab 11/17/20 1553 11/21/20 0150 11/22/20 0326 11/23/20 0151  WBC 9.0 8.1 10.8* 10.3  NEUTROABS 5.6  --   --   --   HGB 11.1* 9.6* 9.8* 9.7*  HCT 36.3 31.6* 32.0* 31.2*  MCV 85.4 86.1 84.4 85.2  PLT 293 246 239 247    Basic Metabolic Panel: Recent Labs  Lab 11/18/20 1116 11/20/20 0841 11/21/20 0150 11/21/20 1528 11/22/20 0326 11/23/20 0151 11/23/20 1149  NA 137 129* 130* 129* 137 137  --   K 4.0 4.5 5.4* 5.8* 3.6 3.6  --   CL 99 93* 95* 97* 104 98  --   CO2 28 30 25 24 26 27   --   GLUCOSE 172* 205* 152* 239* 149* 169* 273*  BUN 22 42* 61* 57* 46* 27*  --   CREATININE 1.03* 1.83* 2.84* 1.94* 1.27* 0.96  --   CALCIUM 9.3 8.5* 8.4* 8.4* 8.3* 8.4*  --   MG 1.5*  --   --   --   --   --   --  GFR: Estimated Creatinine Clearance: 46.8 mL/min (by C-G formula based on SCr of 0.96 mg/dL). Liver Function  Tests: Recent Labs  Lab 11/17/20 1635 11/21/20 0825  AST 25 22  ALT 11 14  ALKPHOS 49 41  BILITOT 0.3 0.4  PROT 6.0* 6.0*  ALBUMIN 2.9* 3.1*    Recent Labs  Lab 11/17/20 1635  LIPASE 34    No results for input(s): AMMONIA in the last 168 hours. Coagulation Profile: No results for input(s): INR, PROTIME in the last 168 hours. Cardiac Enzymes: No results for input(s): CKTOTAL, CKMB, CKMBINDEX, TROPONINI in the last 168 hours. BNP (last 3 results) No results for input(s): PROBNP in the last 8760 hours. HbA1C: No results for input(s): HGBA1C in the last 72 hours.  CBG: Recent Labs  Lab 11/23/20 1137 11/23/20 1628 11/23/20 2048 11/24/20 0750 11/24/20 1131  GLUCAP 269* 208* 203* 166* 227*    Lipid Profile: No results for input(s): CHOL, HDL, LDLCALC, TRIG, CHOLHDL, LDLDIRECT in the last 72 hours.  Thyroid Function Tests: No results for input(s): TSH, T4TOTAL, FREET4, T3FREE, THYROIDAB in the last 72 hours.  Anemia Panel: No results for input(s): VITAMINB12, FOLATE, FERRITIN, TIBC, IRON, RETICCTPCT in the last 72 hours. Sepsis Labs: No results for input(s): PROCALCITON, LATICACIDVEN in the last 168 hours.  Recent Results (from the past 240 hour(s))  Resp Panel by RT-PCR (Flu A&B, Covid) Nasopharyngeal Swab     Status: Abnormal   Collection Time: 11/17/20  4:05 PM   Specimen: Nasopharyngeal Swab; Nasopharyngeal(NP) swabs in vial transport medium  Result Value Ref Range Status   SARS Coronavirus 2 by RT PCR POSITIVE (A) NEGATIVE Final    Comment: RESULT CALLED TO, READ BACK BY AND VERIFIED WITH: [place] NICKERSON RN 11/17/20 @1642  BY JW (NOTE) SARS-CoV-2 target nucleic acids are DETECTED.  The SARS-CoV-2 RNA is generally detectable in upper respiratory specimens during the acute phase of infection. Positive results are indicative of the presence of the identified virus, but do not rule out bacterial infection or co-infection with other pathogens not detected by  the test. Clinical correlation with patient history and other diagnostic information is necessary to determine patient infection status. The expected result is Negative.  Fact Sheet for Patients:  Fact Sheet for Healthcare Providers: BloggerCourse.com  This test is not yet approved or cleared by the SeriousBroker.it FDA and  has been authorized for detection and/or diagnosis of SARS-CoV-2 by FDA under an Emergency Use Authorization (EUA).  This EUA will remain in effect (meaning this test c an be used) for the duration of  the COVID-19 declaration under Section 564(b)(1) of the Act, 21 U.S.C. section 360bbb-3(b)(1), unless the authorization is terminated or revoked sooner.     Influenza A by PCR NEGATIVE NEGATIVE Final   Influenza B by PCR NEGATIVE NEGATIVE Final    Comment: (NOTE) The Xpert Xpress SARS-CoV-2/FLU/RSV plus assay is intended as an aid in the diagnosis of influenza from Nasopharyngeal swab specimens and should not be used as a sole basis for treatment. Nasal washings and aspirates are unacceptable for Xpert Xpress SARS-CoV-2/FLU/RSV testing.  Fact Sheet for Patients: [place]  Fact Sheet for Healthcare Providers: BloggerCourse.com  This test is not yet approved or cleared by the SeriousBroker.it FDA and has been authorized for detection and/or diagnosis of SARS-CoV-2 by FDA under an Emergency Use Authorization (EUA). This EUA will remain in effect (meaning this test can be used) for the duration of the COVID-19 declaration under Section 564(b)(1) of the Act, 21  U.S.C. section 360bbb-3(b)(1), unless the authorization is terminated or revoked.  Performed at [facility], [address] [place], [place] [address]   Urine Culture     Status: Abnormal   Collection Time: 11/18/20  4:37 PM   Specimen: Urine, Clean Catch  Result Value Ref  Range Status   Specimen Description URINE, CLEAN CATCH  Final   Special Requests   Final    NONE Performed at [facility], [address] [place], [place] [address]    Culture >=100,000 COLONIES/mL KLEBSIELLA PNEUMONIAE (A)  Final   Report Status 11/21/2020 FINAL  Final   Organism ID, Bacteria KLEBSIELLA PNEUMONIAE (A)  Final      Susceptibility   Klebsiella pneumoniae - MIC*    AMPICILLIN >=32 RESISTANT Resistant     CEFAZOLIN <=4 SENSITIVE Sensitive     CEFEPIME <=0.12 SENSITIVE Sensitive     CEFTRIAXONE <=0.25 SENSITIVE Sensitive     CIPROFLOXACIN <=0.25 SENSITIVE Sensitive     GENTAMICIN <=1 SENSITIVE Sensitive     IMIPENEM 1 SENSITIVE Sensitive     NITROFURANTOIN 64 INTERMEDIATE Intermediate     TRIMETH/SULFA <=20 SENSITIVE Sensitive     AMPICILLIN/SULBACTAM <=2 SENSITIVE Sensitive     PIP/TAZO <=4 SENSITIVE Sensitive     * >=100,000 COLONIES/mL KLEBSIELLA PNEUMONIAE      Radiology Studies: No results found.   LOS: 5 days   Lanae Boast, MD [facility]  11/24/2020, 2:02 PM

## 2020-11-25 LAB — GLUCOSE, CAPILLARY
Glucose-Capillary: 159 mg/dL — ABNORMAL HIGH (ref 70–99)
Glucose-Capillary: 169 mg/dL — ABNORMAL HIGH (ref 70–99)
Glucose-Capillary: 150 mg/dL — ABNORMAL HIGH (ref 70–99)
Glucose-Capillary: 167 mg/dL — ABNORMAL HIGH (ref 70–99)

## 2020-11-25 MED ORDER — HYDRALAZINE HCL 50 MG PO TABS
75.0000 mg | ORAL_TABLET | Freq: Three times a day (TID) | ORAL | Status: DC
  Administered 2020-11-25 (×3): 75 mg via ORAL
  Filled 2020-11-25 (×4): qty 1

## 2020-11-25 MED ORDER — HYDRALAZINE HCL 25 MG PO TABS
75.0000 mg | ORAL_TABLET | Freq: Three times a day (TID) | ORAL | 0 refills | Status: DC
Start: 2020-11-25 — End: 2021-05-27

## 2020-11-25 MED ORDER — AMLODIPINE BESYLATE 10 MG PO TABS: 10.0000 mg | ORAL_TABLET | Freq: Every day | ORAL | 0 refills | Status: AC

## 2020-11-25 NOTE — Progress Notes (Signed)
This nurse went over pts discharge with the Tameca and daughter. Both understand and placed in discharge packet. All belongings have been returned to pt.

## 2020-11-25 NOTE — Discharge Instructions (Signed)
Information on my medicine - XARELTO (Rivaroxaban)  Why was Xarelto prescribed for you? Xarelto was prescribed for you to reduce the risk of blood clots forming due to prior blood clots in legs and/or lungs. The medical terms for these abnormal blood clots are venous thromboembolism (VTE) and pulmonary embolism (PE).  What do you need to know about xarelto ? Your current dose of Xarelto is 10 mg daily. Take your Xarelto ONCE DAILY at the same time every day. You may take it either with or without food.  If you have difficulty swallowing the tablet whole, you may crush it and mix in applesauce just prior to taking your dose.  Take Xarelto exactly as prescribed by your doctor and DO NOT stop taking Xarelto without talking to the doctor who prescribed the medication.  Stopping without other VTE prevention medication to take the place of Xarelto may increase your risk of developing a clot.  After discharge, you should have regular check-up appointments with your healthcare provider that is prescribing your Xarelto.    What do you do if you miss a dose? If you miss a dose, take it as soon as you remember on the same day then continue your regularly scheduled once daily regimen the next day. Do not take two doses of Xarelto on the same day.   Important Safety Information A possible side effect of Xarelto is bleeding. You should call your healthcare provider right away if you experience any of the following: Bleeding from an injury or your nose that does not stop. Unusual colored urine (red or dark brown) or unusual colored stools (red or black). Unusual bruising for unknown reasons. A serious fall or if you hit your head (even if there is no bleeding).  Some medicines may interact with Xarelto and might increase your risk of bleeding while on Xarelto. To help avoid this, consult your healthcare provider or pharmacist prior to using any new prescription or non-prescription  medications, including herbals, vitamins, non-steroidal anti-inflammatory drugs (NSAIDs) and supplements.  This website has more information on Xarelto: VisitDestination.com.br.

## 2020-11-25 NOTE — TOC Transition Note (Addendum)
Transition of Care [facility]) - CM/SW Discharge Note   Patient Details  Name: Virginia Summers MRN: [id number] Date of Birth: 11/09/1940  [facility] of [facility]) CM/SW Contact:  Lorri Frederick, LCSW Phone Number: 11/25/2020, 10:30 AM   Clinical Narrative: Pt discharging home with [facility].  [employer] to provide home O2.  Daughter Clydie Braun concerned about getting pt up the ramp and into the home, requesting [facility] transportation home.  No other needs identified.     1300: Change of address: pt will be going to daughter's home: [address] [place], [address]. [employer] and [facility] notified.      Final next level of care: Home w Home Health Services Barriers to Discharge: Barriers Resolved   Patient Goals and CMS Choice   CMS Medicare.gov Compare Post Acute Care list provided to:: Patient Choice offered to / list presented to : Patient  Discharge Placement                Patient to be transferred to facility by: [facility] Name of family member notified: daughter Clydie Braun Patient and family notified of of transfer: 11/25/20  Discharge Plan and Services     Post Acute Care Choice: Home Health          DME Arranged: Oxygen DME Agency: [employer] Date DME Agency Contacted: 11/25/20 Time DME Agency Contacted: 1029 Representative spoke with at DME Agency: Velna Hatchet HH Arranged: PT, OT HH Agency: [facility] Date [facility] Agency Contacted: 11/20/20 Time HH Agency Contacted: 1016 Representative spoke with at [facility] Agency: Stacie  Social Determinants of Health (SDOH) Interventions     Readmission Risk Interventions No flowsheet data found.

## 2020-11-25 NOTE — Progress Notes (Signed)
Notified MD about pt BP with diastolic being 22.  This nurse had not given Hydralazine yet and asked DR. Dayna Barker if still wanted to give med. Was given Ok by Dr. Dayna Barker to give Hydralazine.

## 2020-11-25 NOTE — Progress Notes (Signed)
Progress Note  Patient Name: Virginia Summers Date of Encounter: 11/25/2020  [facility] Cardiologist: Kristeen Miss, MD   Subjective   Ready for DC, less short of breath.  Very complementary of Dr. Elease Hashimoto.  Inpatient Medications    Scheduled Meds:  amLODipine  10 mg Oral Daily   atorvastatin  20 mg Oral Daily   chlorthalidone  25 mg Oral Daily   DULoxetine  30 mg Oral Daily   fenofibrate  160 mg Oral Daily   fluticasone  2 spray Each Nare Daily   hydrALAZINE  75 mg Oral Q8H   insulin aspart  0-6 Units Subcutaneous TID WC   irbesartan  300 mg Oral Daily   levothyroxine  25 mcg Oral Q0600   melatonin  3 mg Oral QHS   mometasone-formoterol  2 puff Inhalation BID   rivaroxaban  10 mg Oral Daily   Continuous Infusions:  PRN Meds: acetaminophen **OR** acetaminophen, albuterol, guaiFENesin, hydrALAZINE, ipratropium-albuterol, ondansetron (ZOFRAN) IV, traMADol, traZODone   Vital Signs    Vitals:   11/25/20 0845 11/25/20 0934 11/25/20 1007 11/25/20 1055  BP: (!) 179/33 (!) 153/22 (!) 173/39 (!) 143/33  Pulse:  85  76  Resp:  16  16  Temp:  98.3 F (36.8 C)    TempSrc:  Oral    SpO2:      Weight:      Height:        Intake/Output Summary (Last 24 hours) at 11/25/2020 1108 Last data filed at 11/24/2020 2100 Gross per 24 hour  Intake --  Output 1350 ml  Net -1350 ml   Last 3 Weights 11/22/2020 11/19/2020 04/17/2020  Weight (lbs) 206 lb 12.7 oz 192 lb 10.9 oz 190 lb  Weight (kg) 93.8 kg 87.4 kg 86.183 kg        Physical Exam   GEN: No acute distress.   Neck: No JVD Cardiac: RRR, no murmurs, rubs, or gallops.  Respiratory: Clear to auscultation bilaterally. GI: Soft, nontender, non-distended  MS: No edema; No deformity. Neuro:  Nonfocal  Psych: Normal affect   Labs    High Sensitivity Troponin:   Recent Labs  Lab 11/17/20 1711 11/17/20 1943 11/17/20 2159 11/17/20 2347  TROPONINIHS 16 18* 18* 17      Chemistry Recent Labs  Lab 11/21/20 0825  11/21/20 1528 11/22/20 0326 11/23/20 0151 11/23/20 1149  NA  --  129* 137 137  --   K  --  5.8* 3.6 3.6  --   CL  --  97* 104 98  --   CO2  --  24 26 27   --   GLUCOSE  --  239* 149* 169* 273*  BUN  --  57* 46* 27*  --   CREATININE  --  1.94* 1.27* 0.96  --   CALCIUM  --  8.4* 8.3* 8.4*  --   PROT 6.0*  --   --   --   --   ALBUMIN 3.1*  --   --   --   --   AST 22  --   --   --   --   ALT 14  --   --   --   --   ALKPHOS 41  --   --   --   --   BILITOT 0.4  --   --   --   --   GFRNONAA  --  26* 43* 60*  --   ANIONGAP  --  8 7  12  --      Hematology Recent Labs  Lab 11/21/20 0150 11/22/20 0326 11/23/20 0151  WBC 8.1 10.8* 10.3  RBC 3.67* 3.79* 3.66*  HGB 9.6* 9.8* 9.7*  HCT 31.6* 32.0* 31.2*  MCV 86.1 84.4 85.2  MCH 26.2 25.9* 26.5  MCHC 30.4 30.6 31.1  RDW 14.5 14.2 14.6  PLT 246 239 247    BNPNo results for input(s): BNP, PROBNP in the last 168 hours.   DDimer No results for input(s): DDIMER in the last 168 hours.   Radiology    No results found.  Cardiac Studies   Echocardiogram performed on 9/7 reveals normal left ventricular systolic function.  Grade 1 diastolic dysfunction.  RV function is normal.  PA pressures are also normal at 29 mmHg.  Patient Profile     80 y.o. female with history of pulmonary emboli, obesity hypoventilation syndrome, obstructive sleep apnea, hyperlipidemia, hypertension, grade 1 diastolic dysfunction, normal LV systolic dysfunction who was admitted with worsening shortness of breath and chest pain.  COVID-positive.    Assessment & Plan    Shortness of breath - Thankfully, cardiac work-up is unremarkable.  No new pulmonary emboli. - Symptoms are likely related to COVID diagnosed on September 6.  Essential hypertension - We were asked to comment on her blood pressure medications.  Her carvedilol was discontinued because of bradycardia.  Dr. Dayna Barker noted low diastolic numbers on blood pressure readings. For instance, her blood  pressure is 144/34, has been 187/39 at 1 point. -These numbers are spurious.  Her blood pressure cuff is on her right arm, forearm just above her wrist.  If central blood pressures were obtained, they would be different. -Lets focus on her systolic blood pressure in isolation. I am fine with her current regimen for discharge.  Donato Schultz, MD   For questions or updates, please contact [facility] Please consult [email or web address] for contact info under        Signed, Donato Schultz, MD  11/25/2020, 11:08 AM

## 2020-11-26 NOTE — Progress Notes (Signed)
Pt is waiting for [facility]. Daughter at bedside.

## 2020-11-26 NOTE — Progress Notes (Signed)
Pt is about to leave w/ [facility]. Pt daughter will transport O2 home. [facility] stated that they cant take O2 in ambulance.

## 2020-11-30 LAB — GLUCOSE, CAPILLARY: Glucose-Capillary: 39 mg/dL — CL (ref 70–99)

## 2020-12-03 ENCOUNTER — Ambulatory Visit: Payer: Medicare HMO | Admitting: Cardiovascular Disease

## 2020-12-14 ENCOUNTER — Telehealth: Payer: Self-pay | Admitting: Cardiovascular Disease

## 2020-12-14 NOTE — Telephone Encounter (Signed)
Pt c/o swelling: STAT is pt has developed SOB within 24 hours  If swelling, where is the swelling located? Both legs from knee down  How much weight have you gained and in what time span? no  Have you gained 3 pounds in a day or 5 pounds in a week? no  Do you have a log of your daily weights (if so, list)? 193 while she was at the hospital  Are you currently taking a fluid pill? Not sure  Are you currently SOB? no  Have you traveled recently? no   Patient's daughter states the patient is home bound and has swelling in both her legs from the knee down. She states the patient has no other symptoms. She states the swelling is fine in the morning, but gets worse by the end of the day. She states the patient also swells on amlodipine and was recently put on it.

## 2020-12-15 ENCOUNTER — Ambulatory Visit: Payer: Medicare HMO | Admitting: Physician Assistant

## 2020-12-16 NOTE — Telephone Encounter (Signed)
Follow Up:     Virginia Summers is returning Michalene's call from today, she said the patient is there to talk to.

## 2020-12-16 NOTE — Telephone Encounter (Signed)
Returned call to patient and daughter, Virginia Summers, who states patient is experiencing bilateral leg swelling, worsening since her hospital d/c. She denies SOB. Today BP is 169/72, which is the consistent range for her; HR typically in the 80's to 100's bpm. Patient is mostly sedentary and does not like to elevate her legs. Has therapy coming into the home twice weekly to help with walking 3 minutes every hour but is otherwise sitting most of the day. I explained the findings of normal LV function and G1DD from her recent echo. Patient was restarted on amlodipine 10 mg during her recent hospitalization and BP remains elevated at  home. Charna Busman of importance of elevating legs above heart level, medical grade compression stockings, and to continue to monitor BP. If BP improves, we might be able to lower the dose of amlodipine.  Virginia Summers reports patient eats reduced sodium diet and she agrees with plan and thanked me for the assistance. I offered to make a follow-up appointment for patient and Virginia Summers states the patient is not yet able to get in and out of the car due to knee problems and difficulty with ambulation. I advised her to call back to schedule and she thanked me for the call.

## 2020-12-16 NOTE — Telephone Encounter (Signed)
Called patient.  Call went to VM, mailbox full. Called EC Christena Deem - number her work number, left VM to call back however the DPR is for patient only.

## 2020-12-17 ENCOUNTER — Ambulatory Visit: Payer: Medicare HMO | Admitting: Family Medicine

## 2021-02-10 ENCOUNTER — Other Ambulatory Visit: Payer: Self-pay | Admitting: Cardiovascular Disease

## 2021-02-10 NOTE — Telephone Encounter (Addendum)
Xarelto 10mg  refill request received. 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   Pt is 80 years old, weight-93.8kg, Crea-0.96 on 11/23/2020, last seen by Dr. 94 on 04/17/2020, Diagnosis-DVT & PE, CrCl-68.64ml/min; Dose is appropriate based on dosing criteria for VTE. Will send in refill to requested pharmacy.

## 2021-03-10 ENCOUNTER — Other Ambulatory Visit: Payer: Self-pay | Admitting: Cardiovascular Disease

## 2021-04-13 ENCOUNTER — Other Ambulatory Visit: Payer: Self-pay | Admitting: Cardiovascular Disease

## 2021-05-27 ENCOUNTER — Other Ambulatory Visit: Payer: Self-pay | Admitting: Cardiovascular Disease

## 2021-06-02 ENCOUNTER — Other Ambulatory Visit: Payer: Self-pay | Admitting: Cardiovascular Disease

## 2021-07-12 ENCOUNTER — Other Ambulatory Visit: Payer: Self-pay | Admitting: Cardiovascular Disease

## 2021-08-13 ENCOUNTER — Other Ambulatory Visit: Payer: Self-pay | Admitting: Cardiovascular Disease

## 2021-08-17 ENCOUNTER — Other Ambulatory Visit: Payer: Self-pay | Admitting: Cardiovascular Disease

## 2021-08-17 DIAGNOSIS — I2699 Other pulmonary embolism without acute cor pulmonale: Secondary | ICD-10-CM

## 2021-08-18 NOTE — Telephone Encounter (Signed)
Prescription refill request for Xarelto received.  Indication: DVT/PE Last office visit: 04/17/20 ([facility]ser)  Weight: 93.8kg Age: 81 Scr: 0.96 (11/23/20) CrCl: 68.66ml/min  Pt overdue for office visit with cardiology.

## 2021-08-18 NOTE — Telephone Encounter (Signed)
Called and spoke with pt's daughter, Santiago Glad who stated pt was home bound and was unable to come to office. Scheduled a telephone appt on 08/31/21 with Nicholes Rough. Appropriate dose and refill sent to requested pharmacy.

## 2021-08-27 ENCOUNTER — Inpatient Hospital Stay (HOSPITAL_COMMUNITY): Payer: Medicare HMO

## 2021-08-27 ENCOUNTER — Inpatient Hospital Stay (HOSPITAL_COMMUNITY)
Admission: EM | Admit: 2021-08-27 | Discharge: 2021-09-03 | DRG: 981 | Disposition: A | Discharge status: Home Health | Payer: Medicare HMO | Attending: Family Medicine | Admitting: Family Medicine

## 2021-08-27 ENCOUNTER — Encounter (HOSPITAL_COMMUNITY): Payer: Self-pay

## 2021-08-27 ENCOUNTER — Emergency Department (HOSPITAL_COMMUNITY): Payer: Medicare HMO

## 2021-08-27 DIAGNOSIS — I5033 Acute on chronic diastolic (congestive) heart failure: Secondary | ICD-10-CM | POA: Diagnosis present

## 2021-08-27 DIAGNOSIS — I160 Hypertensive urgency: Secondary | ICD-10-CM | POA: Diagnosis present

## 2021-08-27 DIAGNOSIS — I447 Left bundle-branch block, unspecified: Secondary | ICD-10-CM | POA: Diagnosis not present

## 2021-08-27 DIAGNOSIS — Z1611 Resistance to penicillins: Secondary | ICD-10-CM | POA: Diagnosis present

## 2021-08-27 DIAGNOSIS — Z8249 Family history of ischemic heart disease and other diseases of the circulatory system: Secondary | ICD-10-CM

## 2021-08-27 DIAGNOSIS — F32A Depression, unspecified: Secondary | ICD-10-CM | POA: Diagnosis present

## 2021-08-27 DIAGNOSIS — J9600 Acute respiratory failure, unspecified whether with hypoxia or hypercapnia: Secondary | ICD-10-CM | POA: Diagnosis present

## 2021-08-27 DIAGNOSIS — E1165 Type 2 diabetes mellitus with hyperglycemia: Secondary | ICD-10-CM | POA: Diagnosis not present

## 2021-08-27 DIAGNOSIS — I161 Hypertensive emergency: Secondary | ICD-10-CM | POA: Diagnosis not present

## 2021-08-27 DIAGNOSIS — G8929 Other chronic pain: Secondary | ICD-10-CM | POA: Diagnosis present

## 2021-08-27 DIAGNOSIS — Z8616 Personal history of COVID-19: Secondary | ICD-10-CM | POA: Diagnosis not present

## 2021-08-27 DIAGNOSIS — R3129 Other microscopic hematuria: Secondary | ICD-10-CM | POA: Diagnosis present

## 2021-08-27 DIAGNOSIS — I5031 Acute diastolic (congestive) heart failure: Secondary | ICD-10-CM | POA: Diagnosis not present

## 2021-08-27 DIAGNOSIS — R7881 Bacteremia: Secondary | ICD-10-CM

## 2021-08-27 DIAGNOSIS — E1122 Type 2 diabetes mellitus with diabetic chronic kidney disease: Secondary | ICD-10-CM | POA: Diagnosis present

## 2021-08-27 DIAGNOSIS — Z7951 Long term (current) use of inhaled steroids: Secondary | ICD-10-CM

## 2021-08-27 DIAGNOSIS — N1832 Chronic kidney disease, stage 3b: Secondary | ICD-10-CM | POA: Diagnosis not present

## 2021-08-27 DIAGNOSIS — J449 Chronic obstructive pulmonary disease, unspecified: Secondary | ICD-10-CM | POA: Diagnosis not present

## 2021-08-27 DIAGNOSIS — R001 Bradycardia, unspecified: Secondary | ICD-10-CM | POA: Diagnosis not present

## 2021-08-27 DIAGNOSIS — I455 Other specified heart block: Secondary | ICD-10-CM

## 2021-08-27 DIAGNOSIS — Z9981 Dependence on supplemental oxygen: Secondary | ICD-10-CM

## 2021-08-27 DIAGNOSIS — I130 Hypertensive heart and chronic kidney disease with heart failure and stage 1 through stage 4 chronic kidney disease, or unspecified chronic kidney disease: Secondary | ICD-10-CM | POA: Diagnosis present

## 2021-08-27 DIAGNOSIS — Z79899 Other long term (current) drug therapy: Secondary | ICD-10-CM

## 2021-08-27 DIAGNOSIS — E871 Hypo-osmolality and hyponatremia: Secondary | ICD-10-CM | POA: Diagnosis not present

## 2021-08-27 DIAGNOSIS — Z79890 Hormone replacement therapy (postmenopausal): Secondary | ICD-10-CM

## 2021-08-27 DIAGNOSIS — E875 Hyperkalemia: Secondary | ICD-10-CM | POA: Diagnosis present

## 2021-08-27 DIAGNOSIS — I10 Essential (primary) hypertension: Secondary | ICD-10-CM | POA: Diagnosis not present

## 2021-08-27 DIAGNOSIS — E860 Dehydration: Secondary | ICD-10-CM | POA: Diagnosis present

## 2021-08-27 DIAGNOSIS — Z86711 Personal history of pulmonary embolism: Secondary | ICD-10-CM

## 2021-08-27 DIAGNOSIS — Z833 Family history of diabetes mellitus: Secondary | ICD-10-CM

## 2021-08-27 DIAGNOSIS — Z86718 Personal history of other venous thrombosis and embolism: Secondary | ICD-10-CM

## 2021-08-27 DIAGNOSIS — E785 Hyperlipidemia, unspecified: Secondary | ICD-10-CM | POA: Diagnosis present

## 2021-08-27 DIAGNOSIS — B957 Other staphylococcus as the cause of diseases classified elsewhere: Secondary | ICD-10-CM | POA: Diagnosis not present

## 2021-08-27 DIAGNOSIS — I442 Atrioventricular block, complete: Secondary | ICD-10-CM | POA: Diagnosis present

## 2021-08-27 DIAGNOSIS — Z7901 Long term (current) use of anticoagulants: Secondary | ICD-10-CM

## 2021-08-27 DIAGNOSIS — Z882 Allergy status to sulfonamides status: Secondary | ICD-10-CM

## 2021-08-27 DIAGNOSIS — E662 Morbid (severe) obesity with alveolar hypoventilation: Secondary | ICD-10-CM | POA: Diagnosis present

## 2021-08-27 DIAGNOSIS — N19 Unspecified kidney failure: Secondary | ICD-10-CM

## 2021-08-27 DIAGNOSIS — Z880 Allergy status to penicillin: Secondary | ICD-10-CM

## 2021-08-27 DIAGNOSIS — R579 Shock, unspecified: Secondary | ICD-10-CM | POA: Diagnosis not present

## 2021-08-27 DIAGNOSIS — F419 Anxiety disorder, unspecified: Secondary | ICD-10-CM | POA: Diagnosis present

## 2021-08-27 DIAGNOSIS — Z87891 Personal history of nicotine dependence: Secondary | ICD-10-CM

## 2021-08-27 DIAGNOSIS — N390 Urinary tract infection, site not specified: Secondary | ICD-10-CM | POA: Diagnosis present

## 2021-08-27 DIAGNOSIS — G4700 Insomnia, unspecified: Secondary | ICD-10-CM | POA: Diagnosis present

## 2021-08-27 DIAGNOSIS — B961 Klebsiella pneumoniae [K. pneumoniae] as the cause of diseases classified elsewhere: Secondary | ICD-10-CM | POA: Diagnosis present

## 2021-08-27 DIAGNOSIS — Z789 Other specified health status: Secondary | ICD-10-CM | POA: Diagnosis not present

## 2021-08-27 DIAGNOSIS — D631 Anemia in chronic kidney disease: Secondary | ICD-10-CM | POA: Diagnosis present

## 2021-08-27 DIAGNOSIS — Z6839 Body mass index (BMI) 39.0-39.9, adult: Secondary | ICD-10-CM | POA: Diagnosis not present

## 2021-08-27 DIAGNOSIS — J9621 Acute and chronic respiratory failure with hypoxia: Secondary | ICD-10-CM | POA: Diagnosis not present

## 2021-08-27 DIAGNOSIS — J9601 Acute respiratory failure with hypoxia: Secondary | ICD-10-CM | POA: Diagnosis not present

## 2021-08-27 DIAGNOSIS — I5032 Chronic diastolic (congestive) heart failure: Secondary | ICD-10-CM | POA: Diagnosis not present

## 2021-08-27 DIAGNOSIS — I2699 Other pulmonary embolism without acute cor pulmonale: Secondary | ICD-10-CM

## 2021-08-27 DIAGNOSIS — Z823 Family history of stroke: Secondary | ICD-10-CM

## 2021-08-27 DIAGNOSIS — R0602 Shortness of breath: Secondary | ICD-10-CM | POA: Diagnosis present

## 2021-08-27 DIAGNOSIS — E039 Hypothyroidism, unspecified: Secondary | ICD-10-CM | POA: Diagnosis present

## 2021-08-27 DIAGNOSIS — N179 Acute kidney failure, unspecified: Secondary | ICD-10-CM | POA: Diagnosis not present

## 2021-08-27 DIAGNOSIS — K219 Gastro-esophageal reflux disease without esophagitis: Secondary | ICD-10-CM | POA: Diagnosis present

## 2021-08-27 LAB — GLUCOSE, CAPILLARY
Glucose-Capillary: 243 mg/dL — ABNORMAL HIGH (ref 70–99)
Glucose-Capillary: 212 mg/dL — ABNORMAL HIGH (ref 70–99)
Glucose-Capillary: 172 mg/dL — ABNORMAL HIGH (ref 70–99)

## 2021-08-27 LAB — CBC WITH DIFFERENTIAL/PLATELET
Abs Immature Granulocytes: 0.12 10*3/uL — ABNORMAL HIGH (ref 0.00–0.07)
Basophils Absolute: 0 10*3/uL (ref 0.0–0.1)
Basophils Relative: 0 %
Eosinophils Absolute: 0 10*3/uL (ref 0.0–0.5)
Eosinophils Relative: 0 %
HCT: 28.6 % — ABNORMAL LOW (ref 36.0–46.0)
Hemoglobin: 8.5 g/dL — ABNORMAL LOW (ref 12.0–15.0)
Immature Granulocytes: 1 %
Lymphocytes Relative: 11 %
Lymphs Abs: 0.9 10*3/uL (ref 0.7–4.0)
MCH: 25.9 pg — ABNORMAL LOW (ref 26.0–34.0)
MCHC: 29.7 g/dL — ABNORMAL LOW (ref 30.0–36.0)
MCV: 87.2 fL (ref 80.0–100.0)
Monocytes Absolute: 0.5 10*3/uL (ref 0.1–1.0)
Monocytes Relative: 5 %
Neutro Abs: 7.2 10*3/uL (ref 1.7–7.7)
Neutrophils Relative %: 83 %
Platelets: 289 10*3/uL (ref 150–400)
RBC: 3.28 MIL/uL — ABNORMAL LOW (ref 3.87–5.11)
RDW: 16.8 % — ABNORMAL HIGH (ref 11.5–15.5)
WBC: 8.7 10*3/uL (ref 4.0–10.5)
nRBC: 0.2 % (ref 0.0–0.2)

## 2021-08-27 LAB — COMPREHENSIVE METABOLIC PANEL
ALT: 20 U/L (ref 0–44)
AST: 20 U/L (ref 15–41)
Albumin: 3.7 g/dL (ref 3.5–5.0)
Alkaline Phosphatase: 58 U/L (ref 38–126)
Anion gap: 8 (ref 5–15)
BUN: 70 mg/dL — ABNORMAL HIGH (ref 8–23)
CO2: 18 mmol/L — ABNORMAL LOW (ref 22–32)
Calcium: 9 mg/dL (ref 8.9–10.3)
Chloride: 105 mmol/L (ref 98–111)
Creatinine, Ser: 2.73 mg/dL — ABNORMAL HIGH (ref 0.44–1.00)
GFR, Estimated: 17 mL/min — ABNORMAL LOW (ref >60)
Glucose, Bld: 211 mg/dL — ABNORMAL HIGH (ref 70–99)
Potassium: >7.5 mmol/L (ref 3.5–5.1)
Sodium: 131 mmol/L — ABNORMAL LOW (ref 135–145)
Total Bilirubin: 0.9 mg/dL (ref 0.3–1.2)
Total Protein: 7.8 g/dL (ref 6.5–8.1)

## 2021-08-27 LAB — URINALYSIS, COMPLETE (UACMP) WITH MICROSCOPIC
Bilirubin Urine: NEGATIVE
Glucose, UA: NEGATIVE mg/dL
Ketones, ur: NEGATIVE mg/dL
Nitrite: NEGATIVE
Protein, ur: 100 mg/dL — AB
RBC / HPF: >50 RBC/hpf — ABNORMAL HIGH (ref 0–5)
Specific Gravity, Urine: 1.013 (ref 1.005–1.030)
WBC, UA: >50 WBC/hpf — ABNORMAL HIGH (ref 0–5)
pH: 5 (ref 5.0–8.0)

## 2021-08-27 LAB — RESPIRATORY PANEL BY PCR

## 2021-08-27 LAB — BLOOD GAS, ARTERIAL
Acid-base deficit: 5.2 mmol/L — ABNORMAL HIGH (ref 0.0–2.0)
Bicarbonate: 21.6 mmol/L (ref 20.0–28.0)
Drawn by: 25788
Expiratory PAP: 8 cmH2O
FIO2: 80 %
Inspiratory PAP: 16 cmH2O
O2 Saturation: 100 %
Patient temperature: 37
pCO2 arterial: 46 mmHg (ref 32–48)
pH, Arterial: 7.28 — ABNORMAL LOW (ref 7.35–7.45)
pO2, Arterial: 206 mmHg — ABNORMAL HIGH (ref 83–108)

## 2021-08-27 LAB — STREP PNEUMONIAE URINARY ANTIGEN: Strep Pneumo Urinary Antigen: NEGATIVE

## 2021-08-27 LAB — HEMOGLOBIN A1C
Hgb A1c MFr Bld: 7.6 % — ABNORMAL HIGH (ref 4.8–5.6)
Mean Plasma Glucose: 171.42 mg/dL

## 2021-08-27 LAB — LACTIC ACID, PLASMA: Lactic Acid, Venous: 2 mmol/L (ref 0.5–1.9)

## 2021-08-27 LAB — TROPONIN I (HIGH SENSITIVITY)
Troponin I (High Sensitivity): 14 ng/L (ref <18)
Troponin I (High Sensitivity): 24 ng/L — ABNORMAL HIGH (ref <18)

## 2021-08-27 LAB — RENAL FUNCTION PANEL
Albumin: 3.1 g/dL — ABNORMAL LOW (ref 3.5–5.0)
Anion gap: 7 (ref 5–15)
BUN: 50 mg/dL — ABNORMAL HIGH (ref 8–23)
CO2: 26 mmol/L (ref 22–32)
Calcium: 9.5 mg/dL (ref 8.9–10.3)
Chloride: 105 mmol/L (ref 98–111)
Creatinine, Ser: 2.01 mg/dL — ABNORMAL HIGH (ref 0.44–1.00)
GFR, Estimated: 24 mL/min — ABNORMAL LOW (ref >60)
Glucose, Bld: 197 mg/dL — ABNORMAL HIGH (ref 70–99)
Phosphorus: 4.2 mg/dL (ref 2.5–4.6)
Potassium: 5 mmol/L (ref 3.5–5.1)
Sodium: 138 mmol/L (ref 135–145)

## 2021-08-27 LAB — I-STAT CHEM 8, ED
BUN: 72 mg/dL — ABNORMAL HIGH (ref 8–23)
Calcium, Ion: 1.18 mmol/L (ref 1.15–1.40)
Chloride: 107 mmol/L (ref 98–111)
Creatinine, Ser: 2.9 mg/dL — ABNORMAL HIGH (ref 0.44–1.00)
Glucose, Bld: 232 mg/dL — ABNORMAL HIGH (ref 70–99)
HCT: 27 % — ABNORMAL LOW (ref 36.0–46.0)
Hemoglobin: 9.2 g/dL — ABNORMAL LOW (ref 12.0–15.0)
Potassium: 7.9 mmol/L (ref 3.5–5.1)
Sodium: 130 mmol/L — ABNORMAL LOW (ref 135–145)
TCO2: 17 mmol/L — ABNORMAL LOW (ref 22–32)

## 2021-08-27 LAB — D-DIMER, QUANTITATIVE: D-Dimer, Quant: 1.1 ug/mL-FEU — ABNORMAL HIGH (ref 0.00–0.50)

## 2021-08-27 LAB — TSH: TSH: 2.19 u[IU]/mL (ref 0.350–4.500)

## 2021-08-27 LAB — BRAIN NATRIURETIC PEPTIDE: B Natriuretic Peptide: 1166.7 pg/mL — ABNORMAL HIGH (ref 0.0–100.0)

## 2021-08-27 MED ORDER — PRISMASOL BGK 0/2.5 32-2.5 MEQ/L REPLACEMENT SOLN: Status: DC

## 2021-08-27 MED ORDER — DOCUSATE SODIUM 100 MG PO CAPS: 100.0000 mg | ORAL_CAPSULE | Freq: Two times a day (BID) | ORAL | Status: DC | PRN

## 2021-08-27 MED ORDER — ALBUTEROL SULFATE (2.5 MG/3ML) 0.083% IN NEBU
5.0000 mg | INHALATION_SOLUTION | Freq: Once | RESPIRATORY_TRACT | Status: AC
  Administered 2021-08-27: 5 mg via RESPIRATORY_TRACT
  Filled 2021-08-27: qty 6

## 2021-08-27 MED ORDER — ALTEPLASE 2 MG IJ SOLR: 2.0000 mg | Freq: Once | INTRAMUSCULAR | Status: DC | PRN

## 2021-08-27 MED ORDER — ROCURONIUM BROMIDE 10 MG/ML (PF) SYRINGE
PREFILLED_SYRINGE | INTRAVENOUS | Status: AC
  Filled 2021-08-27: qty 10

## 2021-08-27 MED ORDER — HEPARIN SODIUM (PORCINE) 5000 UNIT/ML IJ SOLN
5000.0000 [IU] | Freq: Three times a day (TID) | INTRAMUSCULAR | Status: DC
  Administered 2021-08-27 – 2021-08-31 (×10): 5000 [IU] via SUBCUTANEOUS
  Filled 2021-08-27 (×10): qty 1

## 2021-08-27 MED ORDER — LORAZEPAM 2 MG/ML IJ SOLN
INTRAMUSCULAR | Status: AC
  Filled 2021-08-27: qty 1

## 2021-08-27 MED ORDER — SODIUM CHLORIDE 0.9 % FOR CRRT: INTRAVENOUS_CENTRAL | Status: DC | PRN

## 2021-08-27 MED ORDER — INSULIN ASPART 100 UNIT/ML IJ SOLN
0.0000 [IU] | INTRAMUSCULAR | Status: DC
  Administered 2021-08-27: 1 [IU] via SUBCUTANEOUS
  Administered 2021-08-27: 2 [IU] via SUBCUTANEOUS
  Administered 2021-08-28: 1 [IU] via SUBCUTANEOUS
  Administered 2021-08-28: 2 [IU] via SUBCUTANEOUS
  Administered 2021-08-28: 1 [IU] via SUBCUTANEOUS
  Administered 2021-08-28: 3 [IU] via SUBCUTANEOUS
  Administered 2021-08-28: 1 [IU] via SUBCUTANEOUS
  Administered 2021-08-29: 3 [IU] via SUBCUTANEOUS
  Administered 2021-08-29: 1 [IU] via SUBCUTANEOUS
  Administered 2021-08-30: 2 [IU] via SUBCUTANEOUS
  Administered 2021-08-31 – 2021-09-02 (×9): 1 [IU] via SUBCUTANEOUS

## 2021-08-27 MED ORDER — POLYETHYLENE GLYCOL 3350 17 G PO PACK: 17.0000 g | PACK | Freq: Every day | ORAL | Status: DC | PRN

## 2021-08-27 MED ORDER — SODIUM BICARBONATE 8.4 % IV SOLN
50.0000 meq | Freq: Once | INTRAVENOUS | Status: AC
  Administered 2021-08-27: 50 meq via INTRAVENOUS

## 2021-08-27 MED ORDER — DEXTROSE 50 % IV SOLN
1.0000 | Freq: Once | INTRAVENOUS | Status: AC
  Administered 2021-08-27: 50 mL via INTRAVENOUS

## 2021-08-27 MED ORDER — PRISMASOL BGK 0/2.5 32-2.5 MEQ/L EC SOLN: Status: DC

## 2021-08-27 MED ORDER — DOBUTAMINE IN D5W 4-5 MG/ML-% IV SOLN: 2.5000 ug/kg/min | INTRAVENOUS | Status: DC

## 2021-08-27 MED ORDER — SODIUM CHLORIDE 0.9 % IV SOLN
500.0000 [IU]/h | INTRAVENOUS | Status: DC
  Administered 2021-08-27 – 2021-08-28 (×2): 500 [IU]/h via INTRAVENOUS_CENTRAL
  Filled 2021-08-27 (×3): qty 2

## 2021-08-27 MED ORDER — CALCIUM CHLORIDE 10 % IV SOLN
1.0000 g | Freq: Once | INTRAVENOUS | Status: AC
  Administered 2021-08-27: 1 g via INTRAVENOUS

## 2021-08-27 MED ORDER — DOCUSATE SODIUM 50 MG/5ML PO LIQD: 100.0000 mg | Freq: Every day | ORAL | Status: DC | PRN

## 2021-08-27 MED ORDER — ALBUTEROL SULFATE (2.5 MG/3ML) 0.083% IN NEBU
INHALATION_SOLUTION | RESPIRATORY_TRACT | Status: AC
  Filled 2021-08-27: qty 3

## 2021-08-27 MED ORDER — INSULIN ASPART 100 UNIT/ML IJ SOLN
10.0000 [IU] | Freq: Once | INTRAMUSCULAR | Status: AC
  Administered 2021-08-27: 10 [IU] via SUBCUTANEOUS
  Filled 2021-08-27: qty 0.1

## 2021-08-27 MED ORDER — HEPARIN SODIUM (PORCINE) 1000 UNIT/ML DIALYSIS
1000.0000 [IU] | INTRAMUSCULAR | Status: DC | PRN
  Administered 2021-08-29: 2400 [IU] via INTRAVENOUS_CENTRAL
  Filled 2021-08-27: qty 6
  Filled 2021-08-27: qty 3

## 2021-08-27 MED ORDER — ATROPINE SULFATE 0.4 MG/ML IV SOLN
0.5000 mg | Freq: Once | INTRAVENOUS | Status: AC
  Administered 2021-08-27: 0.5 mg via INTRAVENOUS
  Filled 2021-08-27: qty 1.25

## 2021-08-27 MED ORDER — ATROPINE SULFATE 1 MG/10ML IJ SOSY
PREFILLED_SYRINGE | INTRAMUSCULAR | Status: AC
  Filled 2021-08-27: qty 10

## 2021-08-27 MED ORDER — FUROSEMIDE 10 MG/ML IJ SOLN
80.0000 mg | Freq: Once | INTRAMUSCULAR | Status: AC
Start: 2021-08-27 — End: 2021-08-27

## 2021-08-27 MED ORDER — PANTOPRAZOLE SODIUM 40 MG IV SOLR
40.0000 mg | Freq: Every day | INTRAVENOUS | Status: DC
  Administered 2021-08-27 – 2021-09-02 (×7): 40 mg via INTRAVENOUS
  Filled 2021-08-27 (×7): qty 10

## 2021-08-27 MED ORDER — SODIUM BICARBONATE 8.4 % IV SOLN
INTRAVENOUS | Status: DC
  Filled 2021-08-27: qty 150
  Filled 2021-08-27: qty 1000
  Filled 2021-08-27: qty 150

## 2021-08-27 MED ORDER — ATROPINE SULFATE 1 MG/ML IV SOLN
0.5000 mg | Freq: Once | INTRAVENOUS | Status: AC
  Administered 2021-08-27: 0.5 mg via INTRAVENOUS
  Filled 2021-08-27: qty 0.5

## 2021-08-27 MED ORDER — FUROSEMIDE 10 MG/ML IJ SOLN
INTRAMUSCULAR | Status: AC
  Administered 2021-08-27: 80 mg via INTRAVENOUS
  Filled 2021-08-27: qty 8

## 2021-08-27 MED ORDER — LORAZEPAM 2 MG/ML IJ SOLN
0.5000 mg | Freq: Once | INTRAMUSCULAR | Status: AC
  Administered 2021-08-27: 0.5 mg via INTRAVENOUS

## 2021-08-27 MED ORDER — ETOMIDATE 2 MG/ML IV SOLN
INTRAVENOUS | Status: AC
  Filled 2021-08-27: qty 20

## 2021-08-27 NOTE — ED Triage Notes (Addendum)
Pt arrived via EMS, from home, called for SOB. Spo2 92% RA. Denies any CP at this time, states Virginia Summers did have some chest pain earlier this morning. 3L [place] at home baseline.    18G L AC 125 solumedrol

## 2021-08-27 NOTE — ED Notes (Signed)
0.5 mg atropine given. MD

## 2021-08-27 NOTE — Progress Notes (Signed)
   Variable  0 Points 1 Point 2 Points Total  Heart rate per minute  <90 beats 90-109 beats >110 beats Huston Foley bein pace  Respiratory  Rate per minute < 18 breaths 19-30 breaths  >30 breaths 1  Restlessness; nonpurposeful movements None  occas slight movement Frequent movement 1  Paradoxical breathing pattern: None  Present 2  Accessory muscle use: rise in clavicle during inspiration None Slight rise Pronounced rise 0  Grunting at end-expiration: guttural sound None  Present 0  Nasal flaring: involuntary movement of nares None  Present 0  Look of fear None  Eyes wide 1  Overall total out of 16    5    Respiratory Distress Observation Scale Journal of Palliative Medicine Vol. 13, Number 3, 2010 Campbell et al.   ON CRRT x 30 min  Volume being removed Being paced Family at bedside Able to communicate  Monitor clsoely Hopefully wity volume removal resps tatus will improve Intubate if needed  Will sign out to elink    SIGNATURE    Dr. Kalman Shan, M.D., F.C.C.P,  Pulmonary and Critical Care Medicine Staff Physician, [name] Center Director - Interstitial Lung Disease  Program  Medical Director - [facility] Long [facility] Pulmonary Fibrosis [facility] [place], [place], [address]  NPI Number:  NPI #[id number] DEA Number: [id number]  Pager: [phone], If no answer  -> Check AMION or Try [phone] Telephone (clinical office): [phone] Telephone (research): [phone]  6:28 PM 08/27/2021

## 2021-08-27 NOTE — Progress Notes (Signed)
eLink Physician-Brief Progress Note Patient Name: Virginia Summers DOB: 10-19-1940 MRN: [id number]   Date of Service  08/27/2021  HPI/Events of Note  Patient with mild delirium, trying to pull off her BIPAP mask.  eICU Interventions  Bilateral mittens ordered PRN, bedside RN will sit with patient until she drifts off to sleep, stimulation will be minimized.        Thomasene Lot Temperance Kelemen 08/27/2021, 11:49 PM

## 2021-08-27 NOTE — Progress Notes (Signed)
eLink Physician-Brief Progress Note Patient Name: Virginia Summers DOB: 08-23-1940 MRN: [id number]   Date of Service  08/27/2021  HPI/Events of Note  Patient resting comfortably on BIPAP, CRRT in progress, Heart rate upper 50's, patient is hypertensive.  eICU Interventions  Continue to monitor closely.        Thomasene Lot Ziere Docken 08/27/2021, 10:53 PM

## 2021-08-27 NOTE — Consult Note (Addendum)
Cardiology Consultation:   Patient ID: Virginia Summers MRN: [id number]; DOB: 1940-10-07  Admit date: 08/27/2021 Date of Consult: 08/27/2021  PCP:  Eather Colas, FNP   [facility] Providers Cardiologist:  Kristeen Miss, MD        Patient Profile:   Virginia Summers is a 81 y.o. female with a hx of  pulmonary embolism, obesity hypoventilation, chronic resp failure no 2-4L at home,  OSA, HL, HTN, diastolic dysfunction who is being seen 08/27/2021 for the evaluation of bradycardia at the request of Dr Marchelle Gearing.  History of Present Illness:   Virginia Summers 81 yo female history of pulmonary embolism, obesity hypoventilation, chronic resp failure no 2-4L at home,  OSA, HL, HTN, diastolic dysfunction presented with SOB x 5 days. From ER note during initial evaluation patient with near syncopal episode in ER, found to be in complete heart block. Given atropine x 3 with limited response. Trancutaneous pacing started and placed on bipap. K came back at 7.9, also with AKI. Admitted by critical care to ICU, sedated on bipap trancutaneous pacing. Catheter placed for emergent dialysis.   Patient is sedated on my evaluation, not able to get history.     K>7.5 CO2 18 Cr 2.73 (0.96 9 months ago) Hgb 8.5 Plt 289 BNP 1166 Ddimer 1.10 TSH 2.19 Lactic acid 2  ABG 7.28/46/21/206 EKG epic EKG not interpretable Trop 14-->24 CXR cardiomegaluy, possible mild HF REnal US unremarkable Past Medical History:  Diagnosis Date   Anticoagulant long-term use    Chest pain    Diabetes mellitus    Diverticulitis    DVT (deep venous thrombosis) (HCC)    BILATERAL, March 2012   GERD (gastroesophageal reflux disease)    HTN (hypertension)    Hypothyroidism    Pulmonary embolism (HCC)     Past Surgical History:  Procedure Laterality Date   APPENDECTOMY  AGE 81   BACK SURGERY  2000   DISC   PARTIAL HYSTERECTOMY  AGE 81's APPROX.   SECONDARY INTRAOCULAR LENSE IMPLANTATION  2005,2006   OU   SHOULDER SURGERY  5-6  YRS OLD   PINS      Inpatient Medications: Scheduled Meds:  etomidate       heparin injection (subcutaneous)  5,000 Units Subcutaneous Q8H   insulin aspart  0-6 Units Subcutaneous Q4H   LORazepam       pantoprazole (PROTONIX) IV  40 mg Intravenous QHS   rocuronium bromide       Continuous Infusions:  heparin 10,000 units/ 20 mL infusion syringe 500 Units/hr (08/27/21 1753)   prismasol BGK 0/2.5 500 mL/hr at 08/27/21 1757   prismasol BGK 0/2.5 500 mL/hr at 08/27/21 1757   prismasol BGK 0/2.5 1,500 mL/hr at 08/27/21 1756   sodium bicarbonate 150 mEq in dextrose 5 % 1,150 mL infusion 100 mL/hr at 08/27/21 1900   PRN Meds: alteplase, docusate sodium, etomidate, heparin, LORazepam, polyethylene glycol, rocuronium bromide, sodium chloride  Allergies:    Allergies  Allergen Reactions   Penicillins Swelling and Rash    Did it involve swelling of the face/tongue/throat, SOB, or low BP? No Did it involve sudden or severe rash/hives, skin peeling, or any reaction on the inside of your mouth or nose? No Did you need to seek medical attention at a hospital or doctor's office? No When did it last happen?    childhood   If all above answers are "NO", may proceed with cephalosporin use.   Sulfa Drugs Cross Reactors Swelling and Rash  Social History:   Social History   Socioeconomic History   Marital status: Married    Spouse name: Not on file   Number of children: Not on file   Years of education: Not on file   Highest education level: Not on file  Occupational History   Not on file  Tobacco Use   Smoking status: Former    Packs/day: 0.50    Years: 30.00    Total pack years: 15.00    Types: Cigarettes    Quit date: 03/15/2003    Years since quitting: 18.4   Smokeless tobacco: Never  Substance and Sexual Activity   Alcohol use: No   Drug use: No   Sexual activity: Not on file  Other Topics Concern   Not on file  Social History Narrative   Not on file   Social  Determinants of Health   Financial Resource Strain: Not on file  Food Insecurity: Not on file  Transportation Needs: Not on file  Physical Activity: Not on file  Stress: Not on file  Social Connections: Not on file  Intimate Partner Violence: Not on file    Family History:    Family History  Problem Relation Age of Onset   Transient ischemic attack Father    Coronary artery disease Father    Stroke Mother    Diabetes Mother    Diabetes Sister    Hypertension Daughter      ROS:  Please see the history of present illness.   All other ROS reviewed and negative.     Physical Exam/Data:   Vitals:   08/27/21 1815 08/27/21 1830 08/27/21 1845 08/27/21 1900  BP:    (!) 82/67  Pulse: 61 63 60 (!) 58  Resp: (!) 21 (!) 21 19 (!) 21  Temp:      TempSrc:      SpO2: 100% 100% 100% 100%  Weight:      Height:        Intake/Output Summary (Last 24 hours) at 08/27/2021 1927 Last data filed at 08/27/2021 1900 Gross per 24 hour  Intake 315.62 ml  Output 350 ml  Net -34.38 ml      08/27/2021    5:00 PM 11/22/2020    4:04 AM 11/19/2020    5:00 AM  Last 3 Weights  Weight (lbs) 190 lb 14.7 oz 206 lb 12.7 oz 192 lb 10.9 oz  Weight (kg) 86.6 kg 93.8 kg 87.4 kg     Body mass index is 37.29 kg/m.  General:  Well nourished, well developed, sedated HEENT: normal Neck: no JVD Vascular: No carotid bruits; Distal pulses 2+ bilaterally Cardiac:  normal S1, S2; RRR; no murmur  Lungs:  clear to auscultation bilaterally, no wheezing, rhonchi or rales  Abd: soft, nontender, no hepatomegaly  Ext: no edema Musculoskeletal:  No deformities, BUE and BLE strength normal and equal Skin: warm and dry  Neuro:  CNs 2-12 intact, no focal abnormalities noted Psych:  Normal affect      Laboratory Data:  High Sensitivity Troponin:   Recent Labs  Lab 08/27/21 1425 08/27/21 1716  TROPONINIHS 14 24*     Chemistry Recent Labs  Lab 08/27/21 1425 08/27/21 1505  NA 131* 130*  K >7.5* 7.9*   CL 105 107  CO2 18*  --   GLUCOSE 211* 232*  BUN 70* 72*  CREATININE 2.73* 2.90*  CALCIUM 9.0  --   GFRNONAA 17*  --   ANIONGAP 8  --  Recent Labs  Lab Sep 12, 2021 1425  PROT 7.8  ALBUMIN 3.7  AST 20  ALT 20  ALKPHOS 58  BILITOT 0.9   Lipids No results for input(s): "CHOL", "TRIG", "HDL", "LABVLDL", "LDLCALC", "CHOLHDL" in the last 168 hours.  Hematology Recent Labs  Lab Sep 12, 2021 1425 09/12/21 1505  WBC 8.7  --   RBC 3.28*  --   HGB 8.5* 9.2*  HCT 28.6* 27.0*  MCV 87.2  --   MCH 25.9*  --   MCHC 29.7*  --   RDW 16.8*  --   PLT 289  --    Thyroid  Recent Labs  Lab 09/12/2021 1555  TSH 2.190    BNP Recent Labs  Lab 2021/09/12 1425  BNP 1,166.7*    DDimer  Recent Labs  Lab 12-Sep-2021 1427  DDIMER 1.10*     Radiology/Studies:  US RENAL  Result Date: 09-12-2021 CLINICAL DATA:  [id number].  Acute kidney injury. EXAM: RENAL / URINARY TRACT ULTRASOUND COMPLETE COMPARISON:  Ultrasound renal 11/21/2020 FINDINGS: Limited evaluation due to motion artifact. Right Kidney: Renal measurements: 9.5 x 4.2 x 5.1 cm = volume: 108 mL. Echogenicity within normal limits. No mass or hydronephrosis visualized. Left Kidney: Renal measurements: 9.7 x 5.1 x 5 cm = volume: 129 mL. Echogenicity within normal limits. No mass or hydronephrosis visualized. Urinary bladder: Not visualized. Other: None. IMPRESSION: 1. Unremarkable renal ultrasound with limited evaluation due to respiratory motion artifact. 2. Urinary bladder not visualized. Electronically Signed   By: Tish Frederickson M.D.   On: 09-12-2021 18:59   DG Chest 1 View  Result Date: 09-12-21 CLINICAL DATA:  Central line placement EXAM: CHEST  1 VIEW COMPARISON:  Same day radiograph FINDINGS: Interval placement of right IJ approach central venous catheter with distal tip terminating at the level of the distal SVC. Stable cardiomegaly. Pulmonary vascular congestion with bilateral interstitial prominence, similar to prior. No  pneumothorax. IMPRESSION: Interval placement of right IJ approach central venous catheter with distal tip terminating at the level of the distal SVC. No pneumothorax. Electronically Signed   By: Duanne Guess D.O.   On: 09/12/21 17:21   DG Chest Portable 1 View  Result Date: 09/12/2021 CLINICAL DATA:  Dyspnea EXAM: PORTABLE CHEST 1 VIEW COMPARISON:  11/17/2020 FINDINGS: Transverse diameter of heart is increased. Central pulmonary vessels are prominent. There are no signs of alveolar pulmonary edema. There is slight prominence of interstitial markings in the parahilar regions. There is no focal pulmonary consolidation. Left lateral CP angle is indistinct which may be due to chest wall attenuation or pleural thickening or small effusion. There is no pneumothorax. IMPRESSION: Cardiomegaly. There is prominence of central pulmonary vessels suggesting possible mild CHF. Subtle increase in interstitial markings in the parahilar regions may suggest mild interstitial edema. Electronically Signed   By: Ernie Avena M.D.   On: Sep 12, 2021 15:00     Assessment and Plan:   1.Bradycardia - tele reviewed, looks to be sinus arrest with either junctional escape with her chronic LBBB vs ventricular escape in the 20s in setting of severe hyperkalemia, renal failure - has some chronic conduction disease as prior ekgs have shown  junctional rhythm, LBBB - she is not on any av nodal agents at home.  - currently sedated on bipap, transcutaneously paced. She is comfortable currently with pacing while sedated. Still requiring pacing as of 840AM, attempted weaning rates without native rate kicking in. - on CRRT for severe hyperkalemia, I think her rhythm should improve as K normalizes.  -  bp's are stable - starting dobutamine for chronotropic effects, see if can help expedite weaning of external pacing. Hypertensive and thus dobutamine over dopamine.  - at this time I don't see indication for temp wire given  tolerating external pacing with stable hemodynamics.    2. Hyperkalemia - received albuterol, calcium, insulin, lasix, now receiving emergent HD and on bicarb drip.   3. AKI - admit Cr 2.73, had been 0.96 9 months ago - per nephrology  4. Acute on chronic diastolic HF - based on CXR, bnp. Body habitus limits physical exam.  - fluid removal per CRRT for now, she did receive IV lasix 80mg  x 1.   For questions or updates, please contact [facility] Please consult [email or web address] for contact info under    Signed, , MD  08/27/2021 7:27 PM

## 2021-08-27 NOTE — Procedures (Signed)
Central Venous Catheter Insertion Procedure Note  Virginia Summers  [id number]  07-Dec-1940  Date:08/27/21  Time:5:19 PM   Provider Performing:Conlee Sliter Veleta Miners   Procedure: Insertion of Non-tunneled Central Venous Catheter(36556)with US guidance (72536)    Indication(s) Hemodialysis  Consent Risks of the procedure as well as the alternatives and risks of each were explained to the patient and/or caregiver.  Consent for the procedure was obtained and is signed in the bedside chart  Anesthesia Topical only with 1% lidocaine   Timeout Verified patient identification, verified procedure, site/side was marked, verified correct patient position, special equipment/implants available, medications/allergies/relevant history reviewed, required imaging and test results available.  Sterile Technique Maximal sterile technique including full sterile barrier drape, hand hygiene, sterile gown, sterile gloves, mask, hair covering, sterile ultrasound probe cover (if used).  Procedure Description Area of catheter insertion was cleaned with chlorhexidine and draped in sterile fashion.   With real-time ultrasound guidance a HD catheter was placed into the right internal jugular vein.  Nonpulsatile blood flow and easy flushing noted in all ports.  The catheter was sutured in place and sterile dressing applied.   R IJ Guidewire in vessel    Complications/Tolerance None; patient tolerated the procedure well. Chest X-ray is ordered to verify placement for internal jugular or subclavian cannulation.  Chest x-ray is not ordered for femoral cannulation.  EBL Minimal  Specimen(s) None   Canary Brim, MSN, APRN, NP-C, AGACNP-BC [facility] 08/27/2021, 5:19 PM   Please see [email or web address] for pager details.   From 7A-7P if no response, please call [phone] After hours, please call ELink [phone]

## 2021-08-27 NOTE — Progress Notes (Signed)
Paged oncall [facility] MD - Dr. Mackie Pai for clarification concerning the order for Dobutamine.  Medication goal is HR>50,  Patient's HR has not been <50 this shift.  Rec'd callback with instructions to hold medication.

## 2021-08-27 NOTE — ED Notes (Signed)
0.5 mg atropine given

## 2021-08-27 NOTE — Consult Note (Signed)
Renal Service Consult Note [facility]  Koralee A Turpin 08/27/2021 Sol Blazing, MD Requesting Physician: Dr. Chase Caller  Reason for Consult: Renal failure and hyperkalemia HPI: The patient is a 81 y.o. year-old w/ hx of DM2, DVT, HTN, PE who presented to ED w/ SOB x 5 days. Hx of asthma, on home O2 2-4 L. Nebulizer was not helping. In ED pt had presyncopal event and was found to be in heart block. Rec'd IV atropine x 3, then TCP was used. Bipap was needed as well. K+ came back at 7.9 > she rec'd IV Ca, insulin/ glucose, sod bicarbonate and then additional IV Ca and IV bicarb gtt were given. Pt did not improve and TCP was continued and pt sent to ICU. We are asked to see for renal faliure/ hyperkalemia.   Pt seen in ICU.  Has bipap in place, no hx obtained from the patient. From CCM it appears that pt's HTN meds were recently doubled and then increased again due to poorly controlled BP's.  She is on losartan. She also was started on meloxicam in the past week or so.     ROS - denies CP, no joint pain, no HA, no blurry vision, no rash, no diarrhea, no nausea/ vomiting, no dysuria, no difficulty voiding   Past Medical History  Past Medical History:  Diagnosis Date   Anticoagulant long-term use    Chest pain    Diabetes mellitus    Diverticulitis    DVT (deep venous thrombosis) ([place])    BILATERAL, March 2012   GERD (gastroesophageal reflux disease)    HTN (hypertension)    Hypothyroidism    Pulmonary embolism ([place])    Past Surgical History  Past Surgical History:  Procedure Laterality Date   APPENDECTOMY  AGE 68   BACK SURGERY  2000   DISC   PARTIAL HYSTERECTOMY  AGE 9's APPROX.   SECONDARY INTRAOCULAR LENSE IMPLANTATION  2005,2006   OU   SHOULDER SURGERY  5-6 YRS OLD   PINS   Family History  Family History  Problem Relation Age of Onset   Transient ischemic attack Father    Coronary artery disease Father    Stroke Mother    Diabetes Mother    Diabetes  Sister    Hypertension Daughter    Social History  reports that she quit smoking about 18 years ago. Her smoking use included cigarettes. She has a 15.00 pack-year smoking history. She has never used smokeless tobacco. She reports that she does not drink alcohol and does not use drugs. Allergies  Allergies  Allergen Reactions   Penicillins Swelling and Rash    Did it involve swelling of the face/tongue/throat, SOB, or low BP? No Did it involve sudden or severe rash/hives, skin peeling, or any reaction on the inside of your mouth or nose? No Did you need to seek medical attention at a hospital or doctor's office? No When did it last happen?    childhood   If all above answers are "NO", may proceed with cephalosporin use.   Sulfa Drugs Cross Reactors Swelling and Rash   Home medications Prior to Admission medications   Medication Sig Start Date End Date Taking? Authorizing Provider  acetaminophen (TYLENOL) 500 MG tablet Take 1,000 mg by mouth every 6 (six) hours as needed for moderate pain or headache.    [provider]  albuterol (VENTOLIN HFA) 108 (90 Base) MCG/ACT inhaler Inhale 2 puffs into the lungs every 4 (four) hours as needed.  For shortness of breath.    [provider]  amLODipine (NORVASC) 10 MG tablet Take 1 tablet (10 mg total) by mouth daily. 11/25/20 12/25/20  Antonieta Pert, MD  Artificial Tear Ointment (DRY EYES OP) Apply 1 drop to eye 2 (two) times daily as needed (dry eyes).    [provider]  atorvastatin (LIPITOR) 20 MG tablet Take 20 mg by mouth daily. 05/29/19   [provider]  budesonide-formoterol (SYMBICORT) 80-4.5 MCG/ACT inhaler Inhale 2 puffs into the lungs 2 (two) times daily.    [provider]  chlorthalidone (HYGROTON) 25 MG tablet Take 1 tablet by mouth once daily 07/13/21   Nahser, Wonda Cheng, MD  citalopram (CELEXA) 20 MG tablet Take 20 mg by mouth daily.  04/08/15   [provider]  DULoxetine (CYMBALTA) 60 MG  capsule Take 60 mg by mouth daily. 05/30/19   [provider]  fenofibrate (TRICOR) 145 MG tablet Take 1 tablet by mouth once daily 07/13/21   Nahser, Wonda Cheng, MD  fluticasone [facility]) 50 MCG/ACT nasal spray Place 2 sprays into both nostrils daily as needed for allergies or rhinitis. 09/26/18   [provider]  glucose blood (ONETOUCH ULTRA) test strip USE 1 STRIP TO CHECK GLUCOSE TWICE DAILY OR  AS  DIRECTED. 11/19/20   [provider]  hydrALAZINE (APRESOLINE) 25 MG tablet TAKE 3 TABLETS BY MOUTH THREE TIMES DAILY . APPOINTMENT REQUIRED FOR FUTURE REFILLS 07/13/21   Nahser, Wonda Cheng, MD  levothyroxine (SYNTHROID, LEVOTHROID) 25 MCG tablet TAKE 1 TABLET (25 MCG TOTAL) BY MOUTH DAILY. Patient taking differently: Take 25 mcg by mouth daily before breakfast. 03/08/13   Nahser, Wonda Cheng, MD  metFORMIN (GLUCOPHAGE-XR) 500 MG 24 hr tablet Take 500 mg by mouth 2 (two) times daily before a meal.    [provider]  Multiple Vitamins-Minerals (AIRBORNE PO) Take 1 tablet by mouth daily as needed (cold symtoms).    [provider]  OVER THE COUNTER MEDICATION Take 1 tablet by mouth daily. beets    [provider]  potassium chloride (KLOR-CON) 10 MEQ tablet Take 1 tablet (10 mEq total) by mouth daily. 08/18/21   Nahser, Wonda Cheng, MD  rivaroxaban Alveda Reasons) 10 MG TABS tablet Take 1 tablet by mouth once daily 08/18/21   Nahser, Wonda Cheng, MD  traZODone (DESYREL) 50 MG tablet Take 50 mg by mouth at bedtime as needed for sleep.  09/18/17   [provider]  valsartan (DIOVAN) 320 MG tablet Take 1 tablet (320 mg total) by mouth daily. 08/18/21   Nahser, Wonda Cheng, MD     Vitals:   08/27/21 1414 08/27/21 1430 08/27/21 1505 08/27/21 1520  BP: (!) 187/46 (!) 134/97  (!) 199/36  Pulse:  (!) 33  (!) 55  Resp:  (!) 21  (!) 22  Temp:      TempSrc:      SpO2:  95% 98% 97%   Exam Gen alert, on bipap, follows commands No rash, cyanosis or gangrene Sclera anicteric,  throat clear  No jvd or bruits Chest clear bilat to bases, no rales/ wheezing RRR no MRG Abd soft ntnd no mass or ascites +bs GU normal MS no joint effusions or deformity Ext 1+ pretib edema, no wounds or ulcers Neuro is alert, Ox 3 , nf     Home meds include - amlodipine, lipitor, symbicort, celexa, hygroton, cymbalta, tricor, hydralazine 75 tid, synthroid, metformin, Kdur 10 qd, xarelto, trazodone, valsartan 320 qd  Date  Creat  eGFR   2018  0.95- 1.12   2021  1.08- 1.52 32- 57 ml/min    Feb 2022 1.19   Sept 2022 2.84 >> 0.96 16- 60 ml/min   08/27/2021 2.73, 2.90   Assessment/ Plan: Severe hyperkalemia - will need CRRT for this. This is not likely permanent HD given recent adjustment of pt's medications, suspect the renal failure will recover. Needs CRRT for pacer- dependent bradycardia which is likely due to hyperkalemia. Will use low K+ fluids until K+ < 4.8.  AKI on CKD 3a - b/l creatinine is 0.9- 1.5, eGFR 32- 60 from 2021/ 2022. Creat here is 2.7 in setting of recent addition of Cox II inhibitor and escalation of BP lowering meds (one of which is losartan/ ARB). Avoid all nsaids/ COX 2 inhibitors and acei/ ARB for now.  HTN - avoid ARB/ acei's for now.  Hx of DVT/ PE      Kelly Splinter  MD 08/27/2021, 4:22 PM Recent Labs  Lab 08/27/21 1425 08/27/21 1505  HGB 8.5* 9.2*  ALBUMIN 3.7  --   CALCIUM 9.0  --   CREATININE 2.73* 2.90*  K >7.5* 7.9*

## 2021-08-27 NOTE — Progress Notes (Signed)
eLink Physician-Brief Progress Note Patient Name: Virginia Summers DOB: 13-Mar-1941 MRN: [id number]   Date of Service  08/27/2021  HPI/Events of Note  Patient is on CRRT and needs a renal function panel order for 2300 hours.  eICU Interventions  RFP ordered.        Migdalia Dk 08/27/2021, 8:39 PM

## 2021-08-27 NOTE — Progress Notes (Signed)
CXR reviewed post HD catheter placement, HD catheter in good position.  No pneumothorax.  Ok for use. Reviewed with RN.     Canary Brim, MSN, APRN, NP-C, AGACNP-BC [facility] 08/27/2021, 5:20 PM   Please see [email or web address] for pager details.   From 7A-7P if no response, please call [phone] After hours, please call ELink [phone]

## 2021-08-27 NOTE — ED Provider Notes (Signed)
[facility] DEPT Provider Note   CSN: [id number] Arrival date & time: 08/27/21  1359     History  Chief Complaint  Patient presents with   Shortness of Breath    Virginia Summers is a 81 y.o. female.  81 year old female who presents with increased shortness of breath for the past 5 days.  Nonproductive cough.  Does have history of asthma and bronchitis.  She uses oxygen at home normally in the range of 2 to 4 L.  Patient has a home nebulizer which she has been using without relief.  Has had increasing dyspnea on exertion.  Denies any anginal type chest pain.  Denies any fever or vomiting.       Home Medications Prior to Admission medications   Medication Sig Start Date End Date Taking? Authorizing Provider  acetaminophen (TYLENOL) 500 MG tablet Take 1,000 mg by mouth every 6 (six) hours as needed for moderate pain or headache.    [provider]  albuterol (VENTOLIN HFA) 108 (90 Base) MCG/ACT inhaler Inhale 2 puffs into the lungs every 4 (four) hours as needed. For shortness of breath.    [provider]  amLODipine (NORVASC) 10 MG tablet Take 1 tablet (10 mg total) by mouth daily. 11/25/20 12/25/20  Antonieta Pert, MD  Artificial Tear Ointment (DRY EYES OP) Apply 1 drop to eye 2 (two) times daily as needed (dry eyes).    [provider]  atorvastatin (LIPITOR) 20 MG tablet Take 20 mg by mouth daily. 05/29/19   [provider]  budesonide-formoterol (SYMBICORT) 80-4.5 MCG/ACT inhaler Inhale 2 puffs into the lungs 2 (two) times daily.    [provider]  chlorthalidone (HYGROTON) 25 MG tablet Take 1 tablet by mouth once daily 07/13/21   Nahser, Wonda Cheng, MD  citalopram (CELEXA) 20 MG tablet Take 20 mg by mouth daily.  04/08/15   [provider]  DULoxetine (CYMBALTA) 60 MG capsule Take 60 mg by mouth daily. 05/30/19   [provider]  fenofibrate (TRICOR) 145 MG tablet Take 1 tablet by mouth once daily  07/13/21   Nahser, Wonda Cheng, MD  fluticasone [facility]) 50 MCG/ACT nasal spray Place 2 sprays into both nostrils daily as needed for allergies or rhinitis. 09/26/18   [provider]  glucose blood (ONETOUCH ULTRA) test strip USE 1 STRIP TO CHECK GLUCOSE TWICE DAILY OR  AS  DIRECTED. 11/19/20   [provider]  hydrALAZINE (APRESOLINE) 25 MG tablet TAKE 3 TABLETS BY MOUTH THREE TIMES DAILY . APPOINTMENT REQUIRED FOR FUTURE REFILLS 07/13/21   Nahser, Wonda Cheng, MD  levothyroxine (SYNTHROID, LEVOTHROID) 25 MCG tablet TAKE 1 TABLET (25 MCG TOTAL) BY MOUTH DAILY. Patient taking differently: Take 25 mcg by mouth daily before breakfast. 03/08/13   Nahser, Wonda Cheng, MD  metFORMIN (GLUCOPHAGE-XR) 500 MG 24 hr tablet Take 500 mg by mouth 2 (two) times daily before a meal.    [provider]  Multiple Vitamins-Minerals (AIRBORNE PO) Take 1 tablet by mouth daily as needed (cold symtoms).    [provider]  OVER THE COUNTER MEDICATION Take 1 tablet by mouth daily. beets    [provider]  potassium chloride (KLOR-CON) 10 MEQ tablet Take 1 tablet (10 mEq total) by mouth daily. 08/18/21   Nahser, Wonda Cheng, MD  rivaroxaban Alveda Reasons) 10 MG TABS tablet Take 1 tablet by mouth once daily 08/18/21   Nahser, Wonda Cheng, MD  traZODone (DESYREL) 50 MG tablet Take 50 mg by mouth  at bedtime as needed for sleep.  09/18/17   [provider]  valsartan (DIOVAN) 320 MG tablet Take 1 tablet (320 mg total) by mouth daily. 08/18/21   Nahser, Deloris Ping, MD      Allergies    Penicillins and Sulfa drugs cross reactors    Review of Systems   Review of Systems  All other systems reviewed and are negative.   Physical Exam Updated Vital Signs BP (!) 187/46   Pulse 63   Temp 98.5 F (36.9 C) (Oral)   Resp (!) 26   SpO2 98%  Physical Exam Vitals and nursing note reviewed.  Constitutional:      General: She is not in acute distress.    Appearance: Normal appearance. She is  well-developed. She is not toxic-appearing.  HENT:     Head: Normocephalic and atraumatic.  Eyes:     General: Lids are normal.     Conjunctiva/sclera: Conjunctivae normal.     Pupils: Pupils are equal, round, and reactive to light.  Neck:     Thyroid: No thyroid mass.     Trachea: No tracheal deviation.  Cardiovascular:     Rate and Rhythm: Normal rate and regular rhythm.     Heart sounds: Normal heart sounds. No murmur heard.    No gallop.  Pulmonary:     Effort: Tachypnea present. No respiratory distress.     Breath sounds: No stridor. Examination of the right-upper field reveals decreased breath sounds. Examination of the left-upper field reveals decreased breath sounds. Decreased breath sounds present. No wheezing, rhonchi or rales.  Abdominal:     General: There is no distension.     Palpations: Abdomen is soft.     Tenderness: There is no abdominal tenderness. There is no rebound.  Musculoskeletal:        General: No tenderness. Normal range of motion.     Cervical back: Normal range of motion and neck supple.  Skin:    General: Skin is warm and dry.     Findings: No abrasion or rash.  Neurological:     Mental Status: She is alert and oriented to person, place, and time. Mental status is at baseline.     GCS: GCS eye subscore is 4. GCS verbal subscore is 5. GCS motor subscore is 6.     Cranial Nerves: No cranial nerve deficit.     Sensory: No sensory deficit.     Motor: Motor function is intact.  Psychiatric:        Attention and Perception: Attention normal.        Speech: Speech normal.        Behavior: Behavior normal.     ED Results / Procedures / Treatments   Labs (all labs ordered are listed, but only abnormal results are displayed) Labs Reviewed  COMPREHENSIVE METABOLIC PANEL  CBC WITH DIFFERENTIAL/PLATELET  BRAIN NATRIURETIC PEPTIDE  D-DIMER, QUANTITATIVE  TROPONIN I (HIGH SENSITIVITY)    EKG None  ED ECG REPORT   Date: 08/27/2021  Rate: 26   Rhythm: indeterminate  QRS Axis: indeterminate  Intervals: QT prolonged  ST/T Wave abnormalities: nonspecific ST changes  Conduction Disutrbances:complete heart block  Narrative Interpretation:   Old EKG Reviewed: none available  I have personally reviewed the EKG tracing and agree with the computerized printout as noted.   Radiology No results found.  Procedures Procedures    Medications Ordered in ED Medications  albuterol (PROVENTIL) (2.5 MG/3ML) 0.083% nebulizer solution 5 mg (has no administration  in time range)    ED Course/ Medical Decision Making/ A&P                           Medical Decision Making Amount and/or Complexity of Data Reviewed Labs: ordered. ECG/medicine tests: ordered.  Risk Prescription drug management.   Patient presented short of breath and required oxygen.  Work-up initiated for shortness of breath differential included CHF, PE, ACS.  Called to room emergently due to patient having episode aware she had almost syncopal event.  Patient now was found to be in complete heart block.  Patient given atropine x3 with minimal response.  Patient had transcutaneous pacing started.  Placed on BiPAP to support her oxygenation.  Patient had been electrolytes was so potassium of 7.9.  She was also noted to have evidence of acute kidney injury.  She was treated for hyperkalemic crisis with calcium IV push along with insulin and glucose.  She is also given sodium bicarbonate.  Minimal response to this.  Was given additional dose of calcium along with bicarbonate.  Consultation with cardiology due to patient being in heart block was obtained prior to her electrolytes resulting.  Once they are resulted, discussed with critical care who recommended bicarbonate drip.  Also placed consult to nephrology with Dr. Chales Abrahams obtained.  Patient will require CR T.  Critical care is now here at the bedside and patient go to ICU to have vascular access placed and emergent dialysis  upstairs.  Plan discussed with patient's family at length.  Patient maintaining her airway at this time.  Considered intubation but feel that there is a high risk of her becoming acutely decompensated if this is performed.  Patient is critically ill and requires ICU admission  CRITICAL CARE Performed by: Toy Baker Total critical care time: 75 minutes Critical care time was exclusive of separately billable procedures and treating other patients. Critical care was necessary to treat or prevent imminent or life-threatening deterioration. Critical care was time spent personally by me on the following activities: development of treatment plan with patient and/or surrogate as well as nursing, discussions with consultants, evaluation of patient's response to treatment, examination of patient, obtaining history from patient or surrogate, ordering and performing treatments and interventions, ordering and review of laboratory studies, ordering and review of radiographic studies, pulse oximetry and re-evaluation of patient's condition.         Final Clinical Impression(s) / ED Diagnoses Final diagnoses:  None    Rx / DC Orders ED Discharge Orders     None         Lorre Nick, MD 08/27/21 1549

## 2021-08-27 NOTE — H&P (Signed)
NAME:  Virginia Summers, MRN:  [id number], DOB:  [birth year], LOS: 0 ADMISSION DATE:  08/27/2021, CONSULTATION DATE:  08/27/2021  REFERRING MD:  Dr Bruce Donath of ER, CHIEF COMPLAINT:  Hyperkalemia., complete heart block AKI, resp failure   PCP Eather Colas, FNP Cardiologist Dr. Cheral Bay Nahser   History of Present Illness:   81 year old obese female with multiple medical problem,s, sedentary female who is actually taking care of her daughter who is currently bedridden with back pain.  Patient herself has a history of chronic respiratory 3 L nasal cannula at home at baseline not otherwise specified, hypertension, pulmonary embolism on Eliquis, hypothyroidism, diabetes, BMI 39 obesity, history of COVID in August 2022, grade 1 diastolic dysfunction.,  Chronic hyponatremia on chlorthalidone and sodium, severe uncontrolled hypertension on multiple medications including ARB, Lasix, chlorthalidone diuretic admission in September 2022 for acute respiratory failure with hypoxemia in the setting of hypertension urgency and also associated AKI and hyperkalemia and associated Klebsiella pneumonia UTI during this time did have junctional bradycardia with discontinuation of carvedilol.  Dischargedon oxygen and also short steroid course.  Discharge creatinine was normal 0.9 mg percent in September 2022.  Presented acutely to the emergency department.  History provided by nurse practitioner chart review shows that she was given some meloxicam and triglyceride medications earlier in the week prior to admission.  Also uncontrolled hypertension and blood pressure medication doses increased.  Then called EMS s6/16/23 with worsening shortness of breath for 5 days.  Found to be in class IV dyspnea requiring BiPAP.  Found to be bradycardic and per EDP in complete heart block.  Potassium greater than 7.9.  Creatinine nearly 3 mg percent [tripled in value compared to baseline].  Very similar has been suggested September  2022 but much worse.  Respiratory distress requiring BiPAP.  Patient received atropine x3, calcium, insulin glucose, sodium bicarbonate push subsequently infusion.  CCM called.  At the time of CCM MD evaluation in the bedside in the ICU patient on BiPAP with severe hypertension systolic greater than 180 tachypneic but not paradoxical.  Dr. Fabio Asa renal at the bedside.  Past Medical History:    has a past medical history of Anticoagulant long-term use, Chest pain, Diabetes mellitus, Diverticulitis, DVT (deep venous thrombosis) (HCC), GERD (gastroesophageal reflux disease), HTN (hypertension), Hypothyroidism, and Pulmonary embolism (HCC).   reports that she quit smoking about 18 years ago. Her smoking use included cigarettes. She has a 15.00 pack-year smoking history. She has never used smokeless tobacco.  Past Surgical History:  Procedure Laterality Date   APPENDECTOMY  AGE 72   BACK SURGERY  2000   DISC   PARTIAL HYSTERECTOMY  AGE 53's APPROX.   SECONDARY INTRAOCULAR LENSE IMPLANTATION  2005,2006   OU   SHOULDER SURGERY  5-6 YRS OLD   PINS    Allergies  Allergen Reactions   Penicillins Swelling and Rash    Did it involve swelling of the face/tongue/throat, SOB, or low BP? No Did it involve sudden or severe rash/hives, skin peeling, or any reaction on the inside of your mouth or nose? No Did you need to seek medical attention at a hospital or doctor's office? No When did it last happen?    childhood   If all above answers are "NO", may proceed with cephalosporin use.   Sulfa Drugs Cross Reactors Swelling and Rash     There is no immunization history on file for this patient.  Family History  Problem Relation Age of Onset  Transient ischemic attack Father    Coronary artery disease Father    Stroke Mother    Diabetes Mother    Diabetes Sister    Hypertension Daughter      Current Facility-Administered Medications:    alteplase (CATHFLO ACTIVASE) injection 2 mg, 2 mg,  Intracatheter, Once PRN, Roney Jaffe, MD   docusate sodium (COLACE) capsule 100 mg, 100 mg, Oral, BID PRN, Brand Males, MD   etomidate (AMIDATE) 2 MG/ML injection, , , ,    furosemide (LASIX) 10 MG/ML injection, , , ,    heparin 10,000 units/ 20 mL infusion syringe, 250-1,000 Units/hr, CRRT, Continuous, Roney Jaffe, MD   heparin injection 1,000-6,000 Units, 1,000-6,000 Units, CRRT, PRN, Roney Jaffe, MD   insulin aspart (novoLOG) injection 10 Units, 10 Units, Subcutaneous, Once, Lacretia Leigh, MD   LORazepam (ATIVAN) 2 MG/ML injection, , , ,    pantoprazole (PROTONIX) injection 40 mg, 40 mg, Intravenous, QHS, Lecia Esperanza, MD   polyethylene glycol (MIRALAX / GLYCOLAX) packet 17 g, 17 g, Oral, Daily PRN, Brand Males, MD   prismasol BGK 0/2.5 infusion, , CRRT, Continuous, Roney Jaffe, MD   prismasol BGK 0/2.5 infusion, , CRRT, Continuous, Roney Jaffe, MD   prismasol BGK 0/2.5 infusion, , CRRT, Continuous, Roney Jaffe, MD   rocuronium bromide 100 MG/10ML SOSY, , , ,    sodium bicarbonate 150 mEq in dextrose 5 % 1,150 mL infusion, , Intravenous, Continuous, Lacretia Leigh, MD, Last Rate: 100 mL/hr at 08/27/21 1550, New Bag at 08/27/21 1550   sodium chloride 0.9 % primer fluid for CRRT, , CRRT, PRN, Roney Jaffe, MD     Significant Hospital Events:  08/27/2021 - admit    Interim History / Subjective:   08/27/2021 - seen in bed [facility] emergency intensive care unit  Objective   Blood pressure (!) 199/36, pulse (!) 55, temperature 98.5 F (36.9 C), temperature source Oral, resp. rate (!) 22, SpO2 97 %.       No intake or output data in the 24 hours ending 08/27/21 1653 There were no vitals filed for this visit.  Examination: General: Obese lady lying in the bed 1227 HENT: BiPAP on Lungs: Some crackles but mostly tachypneic mildly paradoxical Cardiovascular: Being paced externally Abdomen: Obese nontender Extremities: Chronic  venous stasis edema Neuro: Alert and trying to communicate but has BiPAP on GU: Not examined   Resolved Hospital Problem list   X  Assessment & Plan:    PULMONARY  A:  History of extensive bilateral pulmonary embolism 05/26/2010 (negative evaluation February 2021 and September 2022] -on permanent anticoagulation with currently on proph dose Eliquis -  Prior to & Present on Admit  History of acute respiratory failure due to acute kidney injury and hypertensive diastolic heart failure with admission September 2022 - Prior to & Present on Admit  Currently admitted with severe acute hypoxemic respiratory failure and distress -Present on Admit.   -Due to acute on chronic likely diastolic dysfunction and acute kidney injury   08/27/2021 -> on BiPAP and at high risk for intubation  P:   BiPAP Head of bed elevation Intubated worse [full code]   NEUROLOGIC A:   Chronic pain, insomnia, anxiety/depression -Home meds of Cymbalta, Celexa, Desyrel  - prior to & Present on Admit At risk for acute encephalopathy  - 08/27/2021 intact mental status  P:   Monitor closely Precedex if needed Avoid benzodiazepines/fentanyl to the extent possible    VASCULAR A:   History of severe uncontrolled hypertension  with previous admissions for hypertensive emergency - Prior to & Present on Admit  -On Diovan, hydralazine, hydrocodone, amlodipine at home  Very likely another hypertensive crisis this admission -- Present on Admit   - 08/27/2021: Systolic blood pressure greater than 180  P:  Aim for 25% reduction [hopefully CRRT will help]  CARDIAC STRUCTURAL A: Known grade 1 diastolic dysfunction on echo September 2022 - Prior to & Present on Admit History of prior admissions for acute on chronic diastolic heart failure - Currently very likely acute on chronic diastolic heart failure Hx of hyperlipideia  - 08/27/2021 requiring BiPAP  P: Check BNP Check troponin Check echo Lasix  x1  CARDIAC ELECTRICA A: Previous history of junctional rhythm -September 2022 Currently admitted with complete heart block and peaked T waves secondary to life-threatening hyperkalemia  08/27/2021 peaked T waves complete heart block being externally paced  P: Cardiology consult Correct electrolyte abnormalities and acidosis Twelve-lead EKG Telemetry monitoring  INFECTIOUS A:   History of COVID in August 2022 with persistent positivity in September 2022 Low risk of infection admission June 2023 current admit    P:   Check urine strep Check urine Legionella Check respiratory virus panel Check procalcitonin Monitor without antibiotics  RENAL A:  Prior hx of AKI Currently AKi with life threatening hyperkalemia - due to BP and ?Metofrmina and dehydration  08/27/2021 - needs CRRT   P:  HD cath and CRRT per renal  METABOLIC A: Likely acidotic    P Check lactatre Check abg Conttinue bic gtt till CRRT takes over   ELECTROLYTES A:  Life threatening  hyperkalemia - Present on Admit  P: Bic gtt -> CRRT   GASTROINTESTINAL A:   History of hiatal hernia small  08/27/2021 -no hx of vomit  P:   PPI  HEMATOLOGIC   - HEME A:  Anemia of chronic disease - baseline around 9gm% - Present on Admit (no chagne at admit)   P:  - PRBC for hgb </= 6.9gm%    - exceptions are   -  if ACS susepcted/confirmed then transfuse for hgb </= 8.0gm%,  or    -  active bleeding with hemodynamic instability, then transfuse regardless of hemoglobin value   At at all times try to transfuse 1 unit prbc as possible with exception of active hemorrhage   HEMATOLOGIC - Platelets A Normal palteet at admit but at risk for thrombocytopenia     P Monitor with anticoagulator  ENDOCRINE A:   T2DM on metformin - Prior to & Present on Admit -on metformin HYperlipidemia - Prior to & Present on Admit -on fenofibrate, Lipitor Hypothyroidism - Prior to & Present on Admit -> on  Synthroid     08/27/2021 at risk for hypo and hyperglycemia  P:   Sliding scale insulin Check TSH Synthroid  MSK/DERM Morbid obesity Sedentary lifestyle ?  Compliance issues Physical deconditioning  All above baseline  Plan  - monitoir    Best practice (daily eval):  Diet: npo Pain/Anxiety/Delirium protocol (if indicated): none VAP protocol (if indicated): x DVT prophylaxis: Prophylaxis dose of Eliquis at home -> Lovenox DVT prophylaxis GI prophylaxis: PPI Glucose control: SSI Mobility: bed rest Code Status: full  Family Communication: Nurse practitioner with daughter Virginia Braun Summers Disposition: ICU [facility]     ATTESTATION & SIGNATURE   The patient Virginia Summers is critically ill with multiple organ systems failure and requires high complexity decision making for assessment and support, frequent evaluation and titration of therapies, application of  advanced monitoring technologies and extensive interpretation of multiple databases.   Critical Care Time devoted to patient care services described in this note is  75  Minutes. This time reflects time of care of this signee Dr Brand Males. This critical care time does not reflect procedure time, or teaching time or supervisory time of PA/NP/Med student/Med Resident etc but could involve care discussion time      SIGNATURE    Dr. Brand Males, M.D., F.C.C.P,  Pulmonary and Critical Care Medicine Staff Physician, [place] Director - Interstitial Lung Disease  Program  [facility] [place], [place], [address]  NPI Number:  NPI [id number]  Pager: [phone], If no answer  -> Check AMION or Try [phone] Telephone (clinical office): [phone] Telephone (research): [phone]  4:53 PM 08/27/2021   08/27/2021 4:53 PM    LABS    PULMONARY Recent Labs  Lab 08/27/21 1505  TCO2 17*    CBC Recent Labs  Lab  08/27/21 1425 08/27/21 1505  HGB 8.5* 9.2*  HCT 28.6* 27.0*  WBC 8.7  --   PLT 289  --     COAGULATION No results for input(s): "INR" in the last 168 hours.  CARDIAC  No results for input(s): "TROPONINI" in the last 168 hours. No results for input(s): "PROBNP" in the last 168 hours.  CHEMISTRY Recent Labs  Lab 08/27/21 1425 08/27/21 1505  NA 131* 130*  K >7.5* 7.9*  CL 105 107  CO2 18*  --   GLUCOSE 211* 232*  BUN 70* 72*  CREATININE 2.73* 2.90*  CALCIUM 9.0  --    CrCl cannot be calculated (Unknown ideal weight.).   LIVER Recent Labs  Lab 08/27/21 1425  AST 20  ALT 20  ALKPHOS 58  BILITOT 0.9  PROT 7.8  ALBUMIN 3.7     INFECTIOUS No results for input(s): "LATICACIDVEN", "PROCALCITON" in the last 168 hours.   ENDOCRINE CBG (last 3)  No results for input(s): "GLUCAP" in the last 72 hours.       IMAGING x48h  - image(s) personally visualized  -   highlighted in bold DG Chest Portable 1 View  Result Date: 08/27/2021 CLINICAL DATA:  Dyspnea EXAM: PORTABLE CHEST 1 VIEW COMPARISON:  11/17/2020 FINDINGS: Transverse diameter of heart is increased. Central pulmonary vessels are prominent. There are no signs of alveolar pulmonary edema. There is slight prominence of interstitial markings in the parahilar regions. There is no focal pulmonary consolidation. Left lateral CP angle is indistinct which may be due to chest wall attenuation or pleural thickening or small effusion. There is no pneumothorax. IMPRESSION: Cardiomegaly. There is prominence of central pulmonary vessels suggesting possible mild CHF. Subtle increase in interstitial markings in the parahilar regions may suggest mild interstitial edema. Electronically Signed   By: Elmer Picker M.D.   On: 08/27/2021 15:00

## 2021-08-28 ENCOUNTER — Other Ambulatory Visit: Payer: Self-pay

## 2021-08-28 ENCOUNTER — Inpatient Hospital Stay (HOSPITAL_COMMUNITY): Payer: Medicare HMO

## 2021-08-28 DIAGNOSIS — J9601 Acute respiratory failure with hypoxia: Secondary | ICD-10-CM | POA: Diagnosis not present

## 2021-08-28 DIAGNOSIS — I5031 Acute diastolic (congestive) heart failure: Secondary | ICD-10-CM | POA: Diagnosis not present

## 2021-08-28 DIAGNOSIS — I442 Atrioventricular block, complete: Secondary | ICD-10-CM | POA: Diagnosis not present

## 2021-08-28 DIAGNOSIS — E875 Hyperkalemia: Secondary | ICD-10-CM

## 2021-08-28 DIAGNOSIS — R001 Bradycardia, unspecified: Secondary | ICD-10-CM | POA: Diagnosis not present

## 2021-08-28 DIAGNOSIS — Z789 Other specified health status: Secondary | ICD-10-CM

## 2021-08-28 LAB — GLUCOSE, CAPILLARY
Glucose-Capillary: 178 mg/dL — ABNORMAL HIGH (ref 70–99)
Glucose-Capillary: 164 mg/dL — ABNORMAL HIGH (ref 70–99)
Glucose-Capillary: 135 mg/dL — ABNORMAL HIGH (ref 70–99)
Glucose-Capillary: 202 mg/dL — ABNORMAL HIGH (ref 70–99)
Glucose-Capillary: 187 mg/dL — ABNORMAL HIGH (ref 70–99)
Glucose-Capillary: 264 mg/dL — ABNORMAL HIGH (ref 70–99)

## 2021-08-28 LAB — BLOOD CULTURE ID PANEL (REFLEXED) - BCID2

## 2021-08-28 LAB — BLOOD GAS, ARTERIAL
Acid-Base Excess: 3.5 mmol/L — ABNORMAL HIGH (ref 0.0–2.0)
Bicarbonate: 29.1 mmol/L — ABNORMAL HIGH (ref 20.0–28.0)
O2 Saturation: 100 %
Patient temperature: 36.8
pCO2 arterial: 47 mmHg (ref 32–48)
pH, Arterial: 7.4 (ref 7.35–7.45)
pO2, Arterial: 125 mmHg — ABNORMAL HIGH (ref 83–108)

## 2021-08-28 LAB — RENAL FUNCTION PANEL
Albumin: 3 g/dL — ABNORMAL LOW (ref 3.5–5.0)
Albumin: 3.2 g/dL — ABNORMAL LOW (ref 3.5–5.0)
Anion gap: 7 (ref 5–15)
Anion gap: 8 (ref 5–15)
BUN: 39 mg/dL — ABNORMAL HIGH (ref 8–23)
BUN: 25 mg/dL — ABNORMAL HIGH (ref 8–23)
CO2: 28 mmol/L (ref 22–32)
CO2: 28 mmol/L (ref 22–32)
Calcium: 8.9 mg/dL (ref 8.9–10.3)
Calcium: 8.7 mg/dL — ABNORMAL LOW (ref 8.9–10.3)
Chloride: 103 mmol/L (ref 98–111)
Chloride: 101 mmol/L (ref 98–111)
Creatinine, Ser: 1.77 mg/dL — ABNORMAL HIGH (ref 0.44–1.00)
Creatinine, Ser: 1.51 mg/dL — ABNORMAL HIGH (ref 0.44–1.00)
GFR, Estimated: 29 mL/min — ABNORMAL LOW (ref >60)
GFR, Estimated: 35 mL/min — ABNORMAL LOW (ref >60)
Glucose, Bld: 181 mg/dL — ABNORMAL HIGH (ref 70–99)
Glucose, Bld: 193 mg/dL — ABNORMAL HIGH (ref 70–99)
Phosphorus: 3.6 mg/dL (ref 2.5–4.6)
Phosphorus: 2.5 mg/dL (ref 2.5–4.6)
Potassium: 4.4 mmol/L (ref 3.5–5.1)
Potassium: 4.1 mmol/L (ref 3.5–5.1)
Sodium: 138 mmol/L (ref 135–145)
Sodium: 137 mmol/L (ref 135–145)

## 2021-08-28 LAB — T4, FREE: Free T4: 1.04 ng/dL (ref 0.61–1.12)

## 2021-08-28 LAB — ECHOCARDIOGRAM COMPLETE
Area-P 1/2: 3.08 cm2
Calc EF: 55.4 %
Height: 60 in
S' Lateral: 3.2 cm
Single Plane A2C EF: 56 %
Single Plane A4C EF: 54.6 %
Weight: 3054.69 oz

## 2021-08-28 LAB — MAGNESIUM: Magnesium: 2.3 mg/dL (ref 1.7–2.4)

## 2021-08-28 LAB — CBC
HCT: 24.8 % — ABNORMAL LOW (ref 36.0–46.0)
Hemoglobin: 7.6 g/dL — ABNORMAL LOW (ref 12.0–15.0)
MCH: 26.1 pg (ref 26.0–34.0)
MCHC: 30.6 g/dL (ref 30.0–36.0)
MCV: 85.2 fL (ref 80.0–100.0)
Platelets: 228 10*3/uL (ref 150–400)
RBC: 2.91 MIL/uL — ABNORMAL LOW (ref 3.87–5.11)
RDW: 16.6 % — ABNORMAL HIGH (ref 11.5–15.5)
WBC: 6.5 10*3/uL (ref 4.0–10.5)
nRBC: 0.5 % — ABNORMAL HIGH (ref 0.0–0.2)

## 2021-08-28 LAB — BASIC METABOLIC PANEL
Anion gap: 8 (ref 5–15)
BUN: 39 mg/dL — ABNORMAL HIGH (ref 8–23)
CO2: 27 mmol/L (ref 22–32)
Calcium: 8.8 mg/dL — ABNORMAL LOW (ref 8.9–10.3)
Chloride: 103 mmol/L (ref 98–111)
Creatinine, Ser: 1.76 mg/dL — ABNORMAL HIGH (ref 0.44–1.00)
GFR, Estimated: 29 mL/min — ABNORMAL LOW (ref >60)
Glucose, Bld: 178 mg/dL — ABNORMAL HIGH (ref 70–99)
Potassium: 4.4 mmol/L (ref 3.5–5.1)
Sodium: 138 mmol/L (ref 135–145)

## 2021-08-28 LAB — LACTIC ACID, PLASMA: Lactic Acid, Venous: 1.8 mmol/L (ref 0.5–1.9)

## 2021-08-28 LAB — APTT: aPTT: 44 seconds — ABNORMAL HIGH (ref 24–36)

## 2021-08-28 LAB — TSH: TSH: 0.613 u[IU]/mL (ref 0.350–4.500)

## 2021-08-28 MED ORDER — DULOXETINE HCL 60 MG PO CPEP
60.0000 mg | ORAL_CAPSULE | Freq: Every day | ORAL | Status: DC
  Administered 2021-08-28 – 2021-09-02 (×6): 60 mg via ORAL
  Filled 2021-08-28 (×2): qty 2
  Filled 2021-08-28 (×4): qty 1

## 2021-08-28 MED ORDER — PRISMASOL BGK 4/2.5 32-4-2.5 MEQ/L REPLACEMENT SOLN: Status: DC

## 2021-08-28 MED ORDER — MAGIC MOUTHWASH W/LIDOCAINE
2.0000 mL | Freq: Four times a day (QID) | ORAL | Status: DC | PRN
  Administered 2021-08-28 – 2021-09-02 (×10): 2 mL via ORAL
  Filled 2021-08-28 (×16): qty 5

## 2021-08-28 MED ORDER — ORAL CARE MOUTH RINSE: 15.0000 mL | OROMUCOSAL | Status: DC | PRN

## 2021-08-28 MED ORDER — CEFEPIME HCL 2 G IV SOLR
2.0000 g | Freq: Two times a day (BID) | INTRAVENOUS | Status: DC
  Administered 2021-08-28 (×2): 2 g via INTRAVENOUS
  Filled 2021-08-28 (×2): qty 12.5

## 2021-08-28 MED ORDER — PERFLUTREN LIPID MICROSPHERE
1.0000 mL | INTRAVENOUS | Status: AC | PRN
  Administered 2021-08-28: 2 mL via INTRAVENOUS

## 2021-08-28 MED ORDER — ORAL CARE MOUTH RINSE
15.0000 mL | OROMUCOSAL | Status: DC
  Administered 2021-08-28 – 2021-08-30 (×5): 15 mL via OROMUCOSAL

## 2021-08-28 MED ORDER — LEVOTHYROXINE SODIUM 25 MCG PO TABS
25.0000 ug | ORAL_TABLET | Freq: Every day | ORAL | Status: DC
  Administered 2021-08-28 – 2021-09-03 (×7): 25 ug via ORAL
  Filled 2021-08-28 (×7): qty 1

## 2021-08-28 MED ORDER — PRISMASOL BGK 4/2.5 32-4-2.5 MEQ/L EC SOLN: Status: DC

## 2021-08-28 MED ORDER — CHLORHEXIDINE GLUCONATE CLOTH 2 % EX PADS
6.0000 | MEDICATED_PAD | Freq: Every day | CUTANEOUS | Status: DC
  Administered 2021-08-28 – 2021-09-03 (×7): 6 via TOPICAL

## 2021-08-28 NOTE — Progress Notes (Addendum)
Pt rhythm beating over the external pacer. 12 lead EKG in chart, reading left bundle branch block. Cardiology notified, instructed to turn off pacer.   6/17 @ 1525 Pacer turned off per cardiology  6/17 @ 1535 Pt HR dropped to 30s, external pacer turned back on  Cardiology aware

## 2021-08-28 NOTE — Progress Notes (Signed)
eLink Physician-Brief Progress Note Patient Name: Virginia Summers DOB: 09-07-1940 MRN: [id number]   Date of Service  08/28/2021  HPI/Events of Note  Per bedside RN patient was coughing earlier vigorously enough to impact her CRRT, when I camera'd in patient is not coughing and CRRT is progressing fine.  eICU Interventions  Monitor patient for now without intervention, raise head of bed slightly.        Thomasene Lot Shaden Lacher 08/28/2021, 2:48 AM

## 2021-08-28 NOTE — Progress Notes (Addendum)
DAILY PROGRESS NOTE   Patient Name: Virginia Summers Date of Encounter: 08/28/2021 Cardiologist: Kristeen Miss, MD  Chief Complaint   On bipap, awake  Patient Profile   Virginia Summers is a 81 y.o. female with a hx of  pulmonary embolism, obesity hypoventilation, chronic resp failure no 2-4L at home,  OSA, HL, HTN, diastolic dysfunction who is being seen 08/27/2021 for the evaluation of bradycardia at the request of Dr Marchelle Gearing.  Subjective   Breathing better today - on BIPAP and CRRT. Potasium now normalized - acidemia has improved. Still being externally paced at 56 (good capture, 14 mA output). I attempted to turn the pacer rate down, but noted no underlying rhythm with HR at 40. She is comfortable with pacing at this point. Echo in progress at bedside - there is incoordinate septal motion (probably from underlying LBBB) - would estimate LVEF to be low normal 50-55% - await final read.  Objective   Vitals:   08/28/21 0300 08/28/21 0400 08/28/21 0430 08/28/21 0600  BP: (!) 168/32  (!) 173/40 (!) 142/119  Pulse: (!) 48  (!) 38 86  Resp: Temp:  98.2 F (36.8 C)    TempSrc:  Axillary    SpO2: 100%  99% 99%  Weight:      Height:        Intake/Output Summary (Last 24 hours) at 08/28/2021 0740 Last data filed at 08/28/2021 0600 Gross per 24 hour  Intake 1425.49 ml  Output 2475 ml  Net -1049.51 ml   Filed Weights   08/27/21 1700  Weight: 86.6 kg    Physical Exam   General appearance: alert, mild distress, morbidly obese, and on bipap Neck: no carotid bruit, no JVD, and thyroid not enlarged, symmetric, no tenderness/mass/nodules Lungs: diminished breath sounds bibasilar Heart: regular rate and rhythm Abdomen: soft, non-tender; bowel sounds normal; no masses,  no organomegaly and obese Extremities: extremities normal, atraumatic, no cyanosis or edema Pulses: 2+ and symmetric Skin: Skin color, texture, turgor normal. No rashes or lesions Neurologic: Mental status:  awake, conversive, follows commands Psych: Pleasant  Inpatient Medications    Scheduled Meds:  Chlorhexidine Gluconate Cloth  6 each Topical Daily   heparin injection (subcutaneous)  5,000 Units Subcutaneous Q8H   insulin aspart  0-6 Units Subcutaneous Q4H   mouth rinse  15 mL Mouth Rinse 4 times per day   pantoprazole (PROTONIX) IV  40 mg Intravenous QHS    Continuous Infusions:  DOBUTamine Stopped (08/27/21 2240)   heparin 10,000 units/ 20 mL infusion syringe 500 Units/hr (08/27/21 1753)   prismasol BGK 0/2.5 500 mL/hr at 08/28/21 0354   prismasol BGK 0/2.5 500 mL/hr at 08/28/21 0418   prismasol BGK 0/2.5 1,500 mL/hr at 08/28/21 4098   sodium bicarbonate 150 mEq in dextrose 5 % 1,150 mL infusion 100 mL/hr at 08/28/21 0600    PRN Meds: alteplase, docusate sodium, heparin, mouth rinse, polyethylene glycol, sodium chloride   Labs   Results for orders placed or performed during the hospital encounter of 08/27/21 (from the past 48 hour(s))  Comprehensive metabolic panel     Status: Abnormal   Collection Time: 08/27/21  2:25 PM  Result Value Ref Range   Sodium 131 (L) 135 - 145 mmol/L   Potassium >7.5 (HH) 3.5 - 5.1 mmol/L    Comment: CRITICAL RESULT CALLED TO, READ BACK BY AND VERIFIED WITH: LEONARD,S  ON 08/27/21 BY LUZOLOP    Chloride 105 98 - 111 mmol/L  CO2 18 (L) 22 - 32 mmol/L   Glucose, Bld 211 (H) 70 - 99 mg/dL    Comment: Glucose reference range applies only to samples taken after fasting for at least 8 hours.   BUN 70 (H) 8 - 23 mg/dL   Creatinine, Ser 1.61 (H) 0.44 - 1.00 mg/dL   Calcium 9.0 8.9 - 09.6 mg/dL   Total Protein 7.8 6.5 - 8.1 g/dL   Albumin 3.7 3.5 - 5.0 g/dL   AST 20 15 - 41 U/L   ALT 20 0 - 44 U/L   Alkaline Phosphatase 58 38 - 126 U/L   Total Bilirubin 0.9 0.3 - 1.2 mg/dL   GFR, Estimated 17 (L) >60 mL/min    Comment: (NOTE) Calculated using the CKD-EPI Creatinine Equation (2021)    Anion gap 8 5 - 15    Comment: Performed at [facility], [address], [place], [place] [address]  Troponin I (High Sensitivity)     Status: None   Collection Time: 08/27/21  2:25 PM  Result Value Ref Range   Troponin I (High Sensitivity) 14 <18 ng/L    Comment: (NOTE) Elevated high sensitivity troponin I (hsTnI) values and significant  changes across serial measurements may suggest ACS but many other  chronic and acute conditions are known to elevate hsTnI results.  Refer to the Links section for chest pain algorithms and additional  guidance. Performed at [facility], [address] [place], [place] [address]   CBC with Differential     Status: Abnormal   Collection Time: 08/27/21  2:25 PM  Result Value Ref Range   WBC 8.7 4.0 - 10.5 K/uL   RBC 3.28 (L) 3.87 - 5.11 MIL/uL   Hemoglobin 8.5 (L) 12.0 - 15.0 g/dL   HCT 14.7 (L) 82.9 - 56.2 %   MCV 87.2 80.0 - 100.0 fL   MCH 25.9 (L) 26.0 - 34.0 pg   MCHC 29.7 (L) 30.0 - 36.0 g/dL   RDW 13.0 (H) 86.5 - 78.4 %   Platelets 289 150 - 400 K/uL   nRBC 0.2 0.0 - 0.2 %   Neutrophils Relative % 83 %   Neutro Abs 7.2 1.7 - 7.7 K/uL   Lymphocytes Relative 11 %   Lymphs Abs 0.9 0.7 - 4.0 K/uL   Monocytes Relative 5 %   Monocytes Absolute 0.5 0.1 - 1.0 K/uL   Eosinophils Relative 0 %   Eosinophils Absolute 0.0 0.0 - 0.5 K/uL   Basophils Relative 0 %   Basophils Absolute 0.0 0.0 - 0.1 K/uL   Immature Granulocytes 1 %   Abs Immature Granulocytes 0.12 (H) 0.00 - 0.07 K/uL    Comment: Performed at [facility], [address], [place], [place] [address]  Brain natriuretic peptide     Status: Abnormal   Collection Time: 08/27/21  2:25 PM  Result Value Ref Range   B Natriuretic Peptide 1,166.7 (H) 0.0 - 100.0 pg/mL    Comment: Performed at [facility], [address], [place], [place] [address]  D-dimer, quantitative     Status: Abnormal   Collection Time: 08/27/21  2:27 PM  Result Value Ref Range   D-Dimer,  Quant 1.10 (H) 0.00 - 0.50 ug/mL-FEU    Comment: (NOTE) At the manufacturer cut-off value of 0.5 g/mL FEU, this assay has a negative predictive value of 95-100%.This assay is intended for use in conjunction with a clinical pretest probability (PTP) assessment model to  exclude pulmonary embolism (PE) and deep venous thrombosis (DVT) in outpatients suspected of PE or DVT. Results should be correlated with clinical presentation. Performed at [facility], [address] [place], [place] [address]   I-stat chem 8, ED (not at [facility] or [facility])     Status: Abnormal   Collection Time: 08/27/21  3:05 PM  Result Value Ref Range   Sodium 130 (L) 135 - 145 mmol/L   Potassium 7.9 (HH) 3.5 - 5.1 mmol/L   Chloride 107 98 - 111 mmol/L   BUN 72 (H) 8 - 23 mg/dL   Creatinine, Ser 4.092.90 (H) 0.44 - 1.00 mg/dL   Glucose, Bld 811232 (H) 70 - 99 mg/dL    Comment: Glucose reference range applies only to samples taken after fasting for at least 8 hours.   Calcium, Ion 1.18 1.15 - 1.40 mmol/L   TCO2 17 (L) 22 - 32 mmol/L   Hemoglobin 9.2 (L) 12.0 - 15.0 g/dL   HCT 91.427.0 (L) 78.236.0 - 95.646.0 %   Comment NOTIFIED PHYSICIAN   TSH     Status: None   Collection Time: 08/27/21  3:55 PM  Result Value Ref Range   TSH 2.190 0.350 - 4.500 uIU/mL    Comment: Performed by a 3rd Generation assay with a functional sensitivity of <=0.01 uIU/mL. Performed at [facility], [address] [place], [place] [address]   Troponin I (High Sensitivity)     Status: Abnormal   Collection Time: 08/27/21  5:16 PM  Result Value Ref Range   Troponin I (High Sensitivity) 24 (H) <18 ng/L    Comment: (NOTE) Elevated high sensitivity troponin I (hsTnI) values and significant  changes across serial measurements may suggest ACS but many other  chronic and acute conditions are known to elevate hsTnI results.  Refer to the "Links" section for chest pain algorithms and additional  guidance. Performed at [facility], [address] [place], [place] [address]   Respiratory (~20 pathogens) panel by PCR     Status: None   Collection Time: 08/27/21  5:18 PM   Specimen: Nasopharyngeal Swab; Respiratory  Result Value Ref Range   Adenovirus NOT DETECTED NOT DETECTED   Coronavirus 229E NOT DETECTED NOT DETECTED    Comment: (NOTE) The Coronavirus on the Respiratory Panel, DOES NOT test for the novel  Coronavirus (2019 nCoV)    Coronavirus HKU1 NOT DETECTED NOT DETECTED   Coronavirus NL63 NOT DETECTED NOT DETECTED   Coronavirus OC43 NOT DETECTED NOT DETECTED   Metapneumovirus NOT DETECTED NOT DETECTED   Rhinovirus / Enterovirus NOT DETECTED NOT DETECTED   Influenza A NOT DETECTED NOT DETECTED   Influenza B NOT DETECTED NOT DETECTED   Parainfluenza Virus 1 NOT DETECTED NOT DETECTED   Parainfluenza Virus 2 NOT DETECTED NOT DETECTED   Parainfluenza Virus 3 NOT DETECTED NOT DETECTED   Parainfluenza Virus 4 NOT DETECTED NOT DETECTED   Respiratory Syncytial Virus NOT DETECTED NOT DETECTED   Bordetella pertussis NOT DETECTED NOT DETECTED   Bordetella Parapertussis NOT DETECTED NOT DETECTED   Chlamydophila pneumoniae NOT DETECTED NOT DETECTED   Mycoplasma pneumoniae NOT DETECTED NOT DETECTED    Comment: Performed at [facility], [address] [place], [place] [address]  Lactic acid, plasma     Status: Abnormal   Collection Time: 08/27/21  5:24 PM  Result Value Ref Range   Lactic Acid, Venous 2.0 (HH) 0.5 - 1.9 mmol/L    Comment: CRITICAL RESULT  CALLED TO, READ BACK BY AND VERIFIED WITH: MAY,B @1831  ON 08/27/21 BY LUZOLOP Performed at [facility], [address], [place], [place]   Blood gas, arterial     Status: Abnormal   Collection Time: 08/27/21  5:45 PM  Result Value Ref Range   FIO2 80 %   Inspiratory PAP 16 cmH2O   Expiratory PAP 8 cmH2O   pH, Arterial 7.28 (L) 7.35 - 7.45   pCO2 arterial 46 32 - 48 mmHg   pO2, Arterial 206 (H) 83  - 108 mmHg   Bicarbonate 21.6 20.0 - 28.0 mmol/L   Acid-base deficit 5.2 (H) 0.0 - 2.0 mmol/L   O2 Saturation 100 %   Patient temperature 37.0    Collection site LEFT RADIAL    Drawn by 08/29/21    Allens test (pass/fail) PASS PASS    Comment: Performed at [facility], [address], [place], [place]  Glucose, capillary     Status: Abnormal   Collection Time: 08/27/21  5:56 PM  Result Value Ref Range   Glucose-Capillary 243 (H) 70 - 99 mg/dL    Comment: Glucose reference range applies only to samples taken after fasting for at least 8 hours.   Comment 1 Notify RN   Hemoglobin A1c     Status: Abnormal   Collection Time: 08/27/21  6:01 PM  Result Value Ref Range   Hgb A1c MFr Bld 7.6 (H) 4.8 - 5.6 %    Comment: (NOTE) Pre diabetes:          5.7%-6.4%  Diabetes:              >6.4%  Glycemic control for   <7.0% adults with diabetes    Mean Plasma Glucose 171.42 mg/dL    Comment: Performed at [facility], [address] [place], [place]  Strep pneumoniae urinary antigen (not at [facility])     Status: None   Collection Time: 08/27/21  6:25 PM  Result Value Ref Range   Strep Pneumo Urinary Antigen NEGATIVE NEGATIVE    Comment:        Infection due to S. pneumoniae cannot be absolutely ruled out since the antigen present may be below the detection limit of the test. Performed at [facility], [address] [place], [place]   Urinalysis, Complete w Microscopic Urine, Catheterized     Status: Abnormal   Collection Time: 08/27/21  6:26 PM  Result Value Ref Range   Color, Urine YELLOW YELLOW   APPearance CLOUDY (A) CLEAR   Specific Gravity, Urine 1.013 1.005 - 1.030   pH 5.0 5.0 - 8.0   Glucose, UA NEGATIVE NEGATIVE mg/dL   Hgb urine dipstick MODERATE (A) NEGATIVE   Bilirubin Urine NEGATIVE NEGATIVE   Ketones, ur NEGATIVE NEGATIVE mg/dL   Protein, ur 08/29/21 (A) NEGATIVE mg/dL   Nitrite NEGATIVE NEGATIVE   Leukocytes,Ua  LARGE (A) NEGATIVE   RBC / HPF >50 (H) 0 - 5 RBC/hpf   WBC, UA >50 (H) 0 - 5 WBC/hpf   Bacteria, UA MANY (A) NONE SEEN   Squamous Epithelial / LPF 0-5 0 - 5   WBC Clumps PRESENT    Mucus PRESENT    Hyaline Casts, UA PRESENT     Comment: Performed at [facility], [address], [place], [place]  Glucose, capillary     Status: Abnormal   Collection Time: 08/27/21  8:10 PM  Result Value Ref Range   Glucose-Capillary 212 (H) 70 - 99 mg/dL    Comment: Glucose reference range applies only to samples taken after fasting for at least 8 hours.   Comment 1 Notify RN    Comment 2 Document in Chart   Renal function panel     Status: Abnormal   Collection Time: 08/27/21 10:35 PM  Result Value Ref Range   Sodium 138 135 - 145 mmol/L    Comment: DELTA CHECK NOTED   Potassium 5.0 3.5 - 5.1 mmol/L    Comment: DELTA CHECK NOTED   Chloride 105 98 - 111 mmol/L   CO2 26 22 - 32 mmol/L   Glucose, Bld 197 (H) 70 - 99 mg/dL    Comment: Glucose reference range applies only to samples taken after fasting for at least 8 hours.   BUN 50 (H) 8 - 23 mg/dL   Creatinine, Ser 0.08 (H) 0.44 - 1.00 mg/dL   Calcium 9.5 8.9 - 67.6 mg/dL   Phosphorus 4.2 2.5 - 4.6 mg/dL   Albumin 3.1 (L) 3.5 - 5.0 g/dL   GFR, Estimated 24 (L) >60 mL/min    Comment: (NOTE) Calculated using the CKD-EPI Creatinine Equation (2021)    Anion gap 7 5 - 15    Comment: Performed at [facility], [address], [place], [place] [address]  Glucose, capillary     Status: Abnormal   Collection Time: 08/27/21 11:29 PM  Result Value Ref Range   Glucose-Capillary 172 (H) 70 - 99 mg/dL    Comment: Glucose reference range applies only to samples taken after fasting for at least 8 hours.   Comment 1 Notify RN    Comment 2 Document in Chart   Renal function panel (daily at 0500)     Status: Abnormal   Collection Time: 08/28/21  2:56 AM  Result Value Ref Range   Sodium 138 135 - 145 mmol/L    Potassium 4.4 3.5 - 5.1 mmol/L   Chloride 103 98 - 111 mmol/L   CO2 28 22 - 32 mmol/L   Glucose, Bld 181 (H) 70 - 99 mg/dL    Comment: Glucose reference range applies only to samples taken after fasting for at least 8 hours.   BUN 39 (H) 8 - 23 mg/dL   Creatinine, Ser 3.26 (H) 0.44 - 1.00 mg/dL   Calcium 8.9 8.9 - 71.2 mg/dL   Phosphorus 3.6 2.5 - 4.6 mg/dL   Albumin 3.0 (L) 3.5 - 5.0 g/dL   GFR, Estimated 29 (L) >60 mL/min    Comment: (NOTE) Calculated using the CKD-EPI Creatinine Equation (2021)    Anion gap 7 5 - 15    Comment: Performed at [facility], [address] [place], [place] [address]  Magnesium     Status: None   Collection Time: 08/28/21  2:56 AM  Result Value Ref Range   Magnesium 2.3 1.7 - 2.4 mg/dL    Comment: Performed at [facility], [address] [place], [place] [address]  APTT     Status: Abnormal   Collection Time: 08/28/21  2:56 AM  Result Value Ref Range   aPTT 44 (H) 24 - 36 seconds    Comment:        IF BASELINE aPTT IS ELEVATED, SUGGEST PATIENT RISK ASSESSMENT BE USED TO DETERMINE APPROPRIATE ANTICOAGULANT THERAPY. Performed at [facility], [address], [place], [place] [address]   CBC     Status:  Abnormal   Collection Time: 08/28/21  2:56 AM  Result Value Ref Range   WBC 6.5 4.0 - 10.5 K/uL   RBC 2.91 (L) 3.87 - 5.11 MIL/uL   Hemoglobin 7.6 (L) 12.0 - 15.0 g/dL   HCT 00.4 (L) 59.9 - 77.4 %   MCV 85.2 80.0 - 100.0 fL   MCH 26.1 26.0 - 34.0 pg   MCHC 30.6 30.0 - 36.0 g/dL   RDW 14.2 (H) 39.5 - 32.0 %   Platelets 228 150 - 400 K/uL   nRBC 0.5 (H) 0.0 - 0.2 %    Comment: Performed at [facility], [address], [place], [place] [address]  Basic metabolic panel     Status: Abnormal   Collection Time: 08/28/21  2:56 AM  Result Value Ref Range   Sodium 138 135 - 145 mmol/L   Potassium 4.4 3.5 - 5.1 mmol/L   Chloride 103 98 - 111 mmol/L   CO2 27 22 - 32  mmol/L   Glucose, Bld 178 (H) 70 - 99 mg/dL    Comment: Glucose reference range applies only to samples taken after fasting for at least 8 hours.   BUN 39 (H) 8 - 23 mg/dL   Creatinine, Ser 5.68 (H) 0.44 - 1.00 mg/dL   Calcium 8.8 (L) 8.9 - 10.3 mg/dL   GFR, Estimated 29 (L) >60 mL/min    Comment: (NOTE) Calculated using the CKD-EPI Creatinine Equation (2021)    Anion gap 8 5 - 15    Comment: Performed at [facility], [address] [place], [place] [address]  Lactic acid, plasma     Status: None   Collection Time: 08/28/21  2:56 AM  Result Value Ref Range   Lactic Acid, Venous 1.8 0.5 - 1.9 mmol/L    Comment: Performed at [facility], [address] [place], [place] [address]  TSH     Status: None   Collection Time: 08/28/21  2:56 AM  Result Value Ref Range   TSH 0.613 0.350 - 4.500 uIU/mL    Comment: Performed by a 3rd Generation assay with a functional sensitivity of <=0.01 uIU/mL. Performed at [facility] [place] [facility], [address], [place], [place] [address]   T4, free     Status: None   Collection Time: 08/28/21  2:56 AM  Result Value Ref Range   Free T4 1.04 0.61 - 1.12 ng/dL    Comment: (NOTE) Biotin ingestion may interfere with free T4 tests. If the results are inconsistent with the TSH level, previous test results, or the clinical presentation, then consider biotin interference. If needed, order repeat testing after stopping biotin. Performed at [facility], [address], [place], [place] [address]   Glucose, capillary     Status: Abnormal   Collection Time: 08/28/21  3:33 AM  Result Value Ref Range   Glucose-Capillary 178 (H) 70 - 99 mg/dL    Comment: Glucose reference range applies only to samples taken after fasting for at least 8 hours.   Comment 1 Notify RN    Comment 2 Document in Chart   Blood gas, arterial     Status: Abnormal   Collection Time: 08/28/21  4:36 AM  Result Value Ref Range   pH,  Arterial 7.4 7.35 - 7.45   pCO2 arterial 47 32 - 48 mmHg   pO2, Arterial 125 (H) 83 - 108 mmHg   Bicarbonate 29.1 (H) 20.0 - 28.0 mmol/L   Acid-Base Excess  3.5 (H) 0.0 - 2.0 mmol/L   O2 Saturation 100 %   Patient temperature 36.8    Allens test (pass/fail) PASS PASS    Comment: Performed at [facility], [address], [place], [place] [address]    ECG   N/A  Telemetry   V-paced at 47 - Personally Reviewed  Radiology    US RENAL  Result Date: 08/27/2021 CLINICAL DATA:  [id number].  Acute kidney injury. EXAM: RENAL / URINARY TRACT ULTRASOUND COMPLETE COMPARISON:  Ultrasound renal 11/21/2020 FINDINGS: Limited evaluation due to motion artifact. Right Kidney: Renal measurements: 9.5 x 4.2 x 5.1 cm = volume: 108 mL. Echogenicity within normal limits. No mass or hydronephrosis visualized. Left Kidney: Renal measurements: 9.7 x 5.1 x 5 cm = volume: 129 mL. Echogenicity within normal limits. No mass or hydronephrosis visualized. Urinary bladder: Not visualized. Other: None. IMPRESSION: 1. Unremarkable renal ultrasound with limited evaluation due to respiratory motion artifact. 2. Urinary bladder not visualized. Electronically Signed   By: Tish Frederickson M.D.   On: 08/27/2021 18:59   DG Chest 1 View  Result Date: 08/27/2021 CLINICAL DATA:  Central line placement EXAM: CHEST  1 VIEW COMPARISON:  Same day radiograph FINDINGS: Interval placement of right IJ approach central venous catheter with distal tip terminating at the level of the distal SVC. Stable cardiomegaly. Pulmonary vascular congestion with bilateral interstitial prominence, similar to prior. No pneumothorax. IMPRESSION: Interval placement of right IJ approach central venous catheter with distal tip terminating at the level of the distal SVC. No pneumothorax. Electronically Signed   By: Duanne Guess D.O.   On: 08/27/2021 17:21   DG Chest Portable 1 View  Result Date: 08/27/2021 CLINICAL DATA:  Dyspnea EXAM:  PORTABLE CHEST 1 VIEW COMPARISON:  11/17/2020 FINDINGS: Transverse diameter of heart is increased. Central pulmonary vessels are prominent. There are no signs of alveolar pulmonary edema. There is slight prominence of interstitial markings in the parahilar regions. There is no focal pulmonary consolidation. Left lateral CP angle is indistinct which may be due to chest wall attenuation or pleural thickening or small effusion. There is no pneumothorax. IMPRESSION: Cardiomegaly. There is prominence of central pulmonary vessels suggesting possible mild CHF. Subtle increase in interstitial markings in the parahilar regions may suggest mild interstitial edema. Electronically Signed   By: Ernie Avena M.D.   On: 08/27/2021 15:00    Cardiac Studies   Echo pending  Assessment   Principal Problem:   Bradycardia Active Problems:   Acute respiratory failure (HCC)   Renal failure   Hyperkalemia   Plan   Virginia Summers presented with acidemia, profound hyperkalemia and aKI - improved with dialysis, but also bradycardic and in an escape rhythm. Probable complete heart block - there is underling LBBB. Not on AV nodal blocking agents ( would avoid) - still requiring pacing today - may ultimately need permanent pacemaker. Since she is capturing well and comfortable on external pacing, would continue that for now to avoid infection risk of a transvenous wire - will re-assess conduction over the next 24-48 hrs. If her intrinsic rhythm does not recover, she will likely need a PPM.  CRITICAL CARE TIME: I have spent a total of 35 minutes with patient reviewing hospital notes, telemetry, EKGs, labs and examining the patient as well as establishing an assessment and plan that was discussed with the patient.  > 50% of time was spent in direct patient care, including management of an external pacemaker for which she is currently dependent on.  The patient is critically ill with multi-organ system failure and requires high  complexity decision making for assessment and support, frequent evaluation and titration of therapies, application of advanced monitoring technologies and extensive interpretation of multiple databases.   Length of Stay:  LOS: 1 day   Chrystie Nose, MD, [facility], FACP  [place]  [facility]  Medical Director of the [facility]  Cardiovascular Risk Reduction Clinic Diplomate of the American Board of Clinical Lipidology Attending Cardiologist  Direct Dial: [phone]  Fax: [phone]  Website:  [email or web address] Nicely Harriet Sutphen 08/28/2021, 7:40 AM

## 2021-08-28 NOTE — Progress Notes (Signed)
Pt off BIPAP at this time.  

## 2021-08-28 NOTE — Progress Notes (Signed)
  Echocardiogram 2D Echocardiogram has been performed.  Leta Jungling M 08/28/2021, 8:28 AM

## 2021-08-28 NOTE — Progress Notes (Addendum)
[facility] Progress Note  Subjective: only 350 cc UOP since admission yest afternoon.  Now K+ down to 4.5 and CRRT bags all changed.   Vitals:   08/28/21 1500 08/28/21 1525 08/28/21 1530 08/28/21 1536  BP: (!) 155/44 (!) 151/46 (!) 138/44 (!) 121/44  Pulse: 85  83   Resp: 14 (!) _0 Temp:      TempSrc:      SpO2: 100% 100% 100% 99%  Weight:      Height:        Exam: Gen alert, no distress, nasal O2 No jvd or bruits Chest clear bilat to bases RRR no MRG Abd soft ntnd no mass or ascites +bs Ext 1+ pretib edema Neuro is alert, Ox 3 , nf     Home meds include - amlodipine, lipitor, symbicort, celexa, hygroton, cymbalta, tricor, hydralazine 75 tid, synthroid, metformin, Kdur 10 qd, xarelto, trazodone, valsartan 320 qd        Date              Creat               eGFR   2018              0.95- 1.12   2021              1.08- 1.52        32- 57 ml/min     Feb 2022       1.19   Sept 2022      2.84 >> 0.96    16- 60 ml/min   08/27/2021      2.73, 2.90     Assessment/ Plan: AKI on CKD 3a - b/l creatinine is 0.9- 1.5, eGFR 32- 60 from 2021/ 2022. Creat here is 2.7 in setting of recent addition of Cox II inhibitor and escalation of BP lowering meds (one of which is losartan/ ARB). Avoid all nsaids/ COX 2 inhibitors and acei/ ARB for now. CRRT started on 6/16. Will continue through the night and plan to dc CRRT tomorrow am. We will then follow her renal function to see if she needs any regular HD.  Severe hyperkalemia - started CRRT for high K+ yesterday. K+ levels have normalized around 4.1.-4.4 today.  Changed CRRT bags to all 4K/2.5Ca. Not sure if the heart rhythm has improved any w/ normalization of the serum K+, defer to cardiology. HTN - avoid ARB/ acei's for now.  Hx of DVT/ PE   Rob Silas Sedam 08/28/2021, 3:56 PM   Recent Labs  Lab 08/27/21 1505 08/27/21 2235 08/28/21 0256  HGB 9.2*  --  7.6*  ALBUMIN  --  3.1* 3.0*  CALCIUM  --  9.5 8.8*  8.9  PHOS   --  4.2 3.6  CREATININE 2.90* 2.01* 1.76*  1.77*  K 7.9* 5.0 4.4  4.4   Inpatient medications:  Chlorhexidine Gluconate Cloth  6 each Topical Daily   heparin injection (subcutaneous)  5,000 Units Subcutaneous Q8H   insulin aspart  0-6 Units Subcutaneous Q4H   levothyroxine  25 mcg Oral QAC breakfast   mouth rinse  15 mL Mouth Rinse 4 times per day   pantoprazole (PROTONIX) IV  40 mg Intravenous QHS     prismasol BGK 4/2.5 500 mL/hr at 08/28/21 0851    prismasol BGK 4/2.5 500 mL/hr at 08/28/21 0851   ceFEPime (MAXIPIME) IV Stopped (08/28/21 1048)   heparin 10,000 units/ 20 mL infusion syringe 500 Units/hr (08/28/21 1241)  prismasol BGK 4/2.5 1,500 mL/hr at 08/28/21 1224   alteplase, docusate sodium, heparin, magic mouthwash w/lidocaine, mouth rinse, polyethylene glycol, sodium chloride

## 2021-08-28 NOTE — Progress Notes (Signed)
Pharmacy Antibiotic Note  Virginia Summers is a 81 y.o. female admitted on 08/27/2021 with significant hyperkalemia, AKI and respiratory distress requring CRRT. Pharmacy has been consulted for cefepime dosing for UTI.  Plan: Cefepime 2 g IV q12h (adjusted for CRRT)  Pharmacy to continue to follow renal function/CRRT plans, cultures and clinical progress for antibiotic dosage adjustments and de-escalation as indicated  Height: 5' (152.4 cm) Weight: 86.6 kg (190 lb 14.7 oz) IBW/kg (Calculated) : 45.5  Temp (24hrs), Avg:98.4 F (36.9 C), Min:98.2 F (36.8 C), Max:99 F (37.2 C)  Recent Labs  Lab 08/27/21 1425 08/27/21 1505 08/27/21 1724 08/27/21 2235 08/28/21 0256  WBC 8.7  --   --   --  6.5  CREATININE 2.73* 2.90*  --  2.01* 1.76*  1.77*  LATICACIDVEN  --   --  2.0*  --  1.8    Estimated Creatinine Clearance: 24.4 mL/min (A) (by C-G formula based on SCr of 1.77 mg/dL (H)).    Allergies  Allergen Reactions   Penicillins Swelling and Rash    Did it involve swelling of the face/tongue/throat, SOB, or low BP? No Did it involve sudden or severe rash/hives, skin peeling, or any reaction on the inside of your mouth or nose? No Did you need to seek medical attention at a hospital or doctor's office? No When did it last happen?    childhood   If all above answers are "NO", may proceed with cephalosporin use.   Sulfa Drugs Cross Reactors Swelling and Rash    Antimicrobials this admission: Cefepime 6/17 >>   Microbiology results: 6/16 BCx: pending 6/16 UCx: pending    Thank you for allowing pharmacy to be a part of this patient's care.  Pricilla Riffle, PharmD, BCPS Clinical Pharmacist 08/28/2021 8:29 AM

## 2021-08-28 NOTE — H&P (Addendum)
NAME:  HAELY LEYLAND, MRN:  [id number], DOB:  1940/04/12, LOS: 1 ADMISSION DATE:  08/27/2021, CONSULTATION DATE:  08/27/2021  REFERRING MD:  Dr Bruce Donath of ER, CHIEF COMPLAINT:  Hyperkalemia., complete heart block AKI, resp failure   PCP Eather Colas, FNP Cardiologist Dr. Cheral Bay Nahser   BRIEF   81 year old obese female with multiple medical problem,s, sedentary female who is actually taking care of her daughter who is currently bedridden with back pain.  Patient herself has a history of chronic respiratory 3 L nasal cannula at home at baseline not otherwise specified, hypertension, pulmonary embolism on Eliquis, hypothyroidism, diabetes, BMI 39 obesity, history of COVID in August 2022, grade 1 diastolic dysfunction.,  Chronic hyponatremia on chlorthalidone and sodium, severe uncontrolled hypertension on multiple medications including ARB, Lasix, chlorthalidone diuretic admission in September 2022 for acute respiratory failure with hypoxemia in the setting of hypertension urgency and also associated AKI and hyperkalemia and associated Klebsiella pneumonia UTI during this time did have junctional bradycardia with discontinuation of carvedilol.  Dischargedon oxygen and also short steroid course.  Discharge creatinine was normal 0.9 mg percent in September 2022.  Presented acutely to the emergency department.  History provided by nurse practitioner chart review shows that she was given some meloxicam and triglyceride medications earlier in the week prior to admission.  Also uncontrolled hypertension and blood pressure medication doses increased.  Then called EMS s6/16/23 with worsening shortness of breath for 5 days.  Found to be in class IV dyspnea requiring BiPAP.  Found to be bradycardic and per EDP in complete heart block.  Potassium greater than 7.9.  Creatinine nearly 3 mg percent [tripled in value compared to baseline].  Very similar has been suggested September 2022 but much worse.   Respiratory distress requiring BiPAP.  Patient received atropine x3, calcium, insulin glucose, sodium bicarbonate push subsequently infusion.  CCM called.  At the time of CCM MD evaluation in the bedside in the ICU patient on BiPAP with severe hypertension systolic greater than 180 tachypneic but not paradoxical.  Dr. Fabio Asa renal at the bedside.  Past Medical History:   has a past medical history of Anticoagulant long-term use, Chest pain, Diabetes mellitus, Diverticulitis, DVT (deep venous thrombosis) (HCC), GERD (gastroesophageal reflux disease), HTN (hypertension), Hypothyroidism, and Pulmonary embolism (HCC).   has a past surgical history that includes Back surgery (2000); Partial hysterectomy (AGE 57's APPROX.); Appendectomy (AGE 46); Shoulder surgery (5-6 YRS OLD); and Secondary intraocular lens implantation (1093,2355).    Significant Hospital Events:  08/27/2021 - admit. [facility] ICU. Renal consult, cards, consult., CCM admit. S HD PLACED, Start CRRT., On BiPAP. Externally paced    Interim History / Subjective:   08/28/2021 -CRRT continues.  Negative balance over 100 cc/h and tolerating it well.  Blood pressure still high.  Not making urine.  Still being externally paced and pacer dependent.  Dr. Rennis Golden cardiology at the bedside and feels patient has intrinsic conduction disease but wants to wait another 24 hours before making a determination.  Respiratory status much improved on BiPAP.  40% FiO2.  Objective   Blood pressure (!) 142/119, pulse 86, temperature 98.2 F (36.8 C), temperature source Axillary, resp. rate 17, height 5' (1.524 m), weight 86.6 kg, SpO2 99 %.    FiO2 (%):  [45 %] 45 %   Intake/Output Summary (Last 24 hours) at 08/28/2021 0740 Last data filed at 08/28/2021 0600 Gross per 24 hour  Intake 1425.49 ml  Output 2475 ml  Net -1049.51  ml   Filed Weights   08/27/21 1700  Weight: 86.6 kg    Examination: General: Obese lady lying in the bed 1227 HENT: BiPAP  on.  Looks much better Lungs: No further crackles.  Respiratory status is synchronous breathing normal respiratory rate.  Clear to auscultation  cardiovascular: Being paced externally and pacer dependent Abdomen: Obese nontender Extremities: Chronic venous stasis edema -seems slightly better Neuro: Alert and trying to communicate but has BiPAP on.  Less distressed today GU: Not examined   Resolved Hospital Problem list   X  Assessment & Plan:     History of extensive bilateral pulmonary embolism 05/26/2010 (negative evaluation CT February 2021 and September 2022] -on permanent anticoagulation with currently on proph dose Eliquis -  Prior to & Present on Admit  History of chronic hypoxemic respiratory failure nocturnal oxygen at home -details not known. - Prior to & Present on Admit  Possibly undiagnosed sleep apnea - Present on Admit   History of acute respiratory failure due to acute kidney injury and hypertensive diastolic heart failure with admission September 2022 - Prior to & Present on Admit  Currently admitted with severe acute hypoxemic respiratory failure and distress -Present on Admit.   -Due to acute on chronic likely diastolic dysfunction and acute kidney injury     08/28/2021 -> on BiPAP and much improved respiratory status  P:   Reduce BiPAP to 2 applications during the day and nightly Head of bed elevation Intubation if worse but improving     Chronic pain, insomnia, anxiety/depression -Home meds of Cymbalta, Celexa, Desyrel  - prior to & Present on Admit At risk for acute encephalopathy  - 08/28/2021 intact mental status  P:   Monitor closely Precedex if needed Avoid benzodiazepines/fentanyl to the extent possible     History of severe uncontrolled hypertension with previous admissions for hypertensive emergency - Prior to & Present on Admit  -On Diovan, hydralazine, hydrocodone, amlodipine at home  Very likely another hypertensive crisis this  admission -- Present on Admit   - 08/28/2021: -Despite volume removal on CRRT blood pressure remains high  P:  Continue to monitor Systolic blood pressure goal between 140 and 160   Known grade 1 diastolic dysfunction on echo September 2022 - Prior to & Present on Admit History of prior admissions for acute on chronic diastolic heart failure - Prior to & Present on Admit  Currently very likely acute on chronic diastolic heart failure (BNP > 1000) - Present on Admit   - 08/28/2021 echocardiogram in progress.  BNP very high.  Being subjective volume removal via CRRT.  Troponin flat  P: Volume removal by CRRT Antihypertensives when appropriate Cardiology following    Previous history of junctional rhythm -September 2022 Currently   - Complete heart block and peaked T waves associated with  life-threatening hyperkalemia - Present on Admit   08/28/2021 potassium is normalized but still appears to be in heart block.  Dependent on external pacing.  Cardiology suspects intrinsic conduction disease  P: Cardiology consult appreciated Per cardiology if unimproved by 08/29/2021: Would likely require insertion of permanent pacemaker Telemetry monitoring     History of COVID in August 2022 with persistent positivity in September 2022 History of Klebsiella UTI September 2022 Currently abnormal urine analysis - Present on Admit   -08/27/2021: Respiratory virus panel negative  -08/27/2021: Urine strep negative  -08/27/2021: Abnormal UA with pyuria and hematuria microscopic and leukocytes -raising concern for UTI   P:   Check procalcitonin Await  urine culture Check blood culture Start cefepime    Prior hx of AKI Currently  = AKi with life threatening hyperkalemia - due to hypertensive urgency and ? Metformin, NSAID meloxicam and dehydration - Present on Admit -Normal renal ultrasound at admission     08/28/2021 -potassium normalized.  Still needing CRRT.  Not making urine.  P:   CRRT per renal Bladder scan monitoring Maintain hemodynamics Avoid nephrotoxins particularly NSAIDs   Mild lactic acidosis at admission  08/28/2021: Resolved  P Bicarb and CRRT per renal    Life threatening  hyperkalemia - Present on Admit  08/28/2021: Potassium normalized  P: CRRT/bicarb per renal    History of hiatal hernia small  08/28/2021 -no hx of vomit  P:   PPI N.p.o.   Anemia of chronic disease - baseline around 9gm% - Present on Admit (no chagne at admit)  08/28/2021: Hemoglobin 7.6 and worse.  Likely due to critical illness no bleeding.  P:  - PRBC for hgb </= 6.9gm%    - exceptions are   -  if ACS susepcted/confirmed then transfuse for hgb </= 8.0gm%,  or    -  active bleeding with hemodynamic instability, then transfuse regardless of hemoglobin value   At at all times try to transfuse 1 unit prbc as possible with exception of active hemorrhage    Normal palteet at admit but at risk for thrombocytopenia On prophylactic dose Eliquis at home   P Monitor with heparin for DVT prophylaxis    T2DM on metformin - Prior to & Present on Admit -on metformin HYperlipidemia - Prior to & Present on Admit -on fenofibrate, Lipitor Hypothyroidism - Prior to & Present on Admit -> on Synthroid (TSH normal this admission]     08/28/2021 at risk for hypo and hyperglycemia  P:   Sliding scale insulin Synthroid  MSK/DERM Morbid obesity Sedentary lifestyle ?  Compliance issues Physical deconditioning  All above baseline  Plan  - monitoir    Best practice (daily eval):  Diet: npo Pain/Anxiety/Delirium protocol (if indicated): none VAP protocol (if indicated): x DVT prophylaxis: Prophylaxis dose of Eliquis at home -> Lovenox DVT prophylaxis GI prophylaxis: PPI Glucose control: SSI Mobility: bed rest Code Status: full  Disposition: ICU [place] [facility]  Family 08/27/2021  - NP updated dtr Fernande Bras 08/28/21 - MD updated dtr Fernande Bras     ATTESTATION & SIGNATURE   The patient Shanayah A Nishida is critically ill with multiple organ systems failure and requires high complexity decision making for assessment and support, frequent evaluation and titration of therapies, application of advanced monitoring technologies and extensive interpretation of multiple databases.   Critical Care Time devoted to patient care services described in this note is  40  Minutes. This time reflects time of care of this signee Dr Brand Males. This critical care time does not reflect procedure time, or teaching time or supervisory time of PA/NP/Med student/Med Resident etc but could involve care discussion time     Dr. Brand Males, M.D., [facility].C.P Pulmonary and Critical Care Medicine Medical Director - [facility] ICU Staff Physician, [facility] Pager: [phone], If no answer or between  15:00h - 7:00h: call [phone]  08/28/2021 8:12 AM     LABS    PULMONARY Recent Labs  Lab 08/27/21 1505 08/27/21 1745 08/28/21 0436  PHART  --  7.28* 7.4  PCO2ART  --  46 47  PO2ART  --  206* 125*  HCO3  --  21.6 29.1*  TCO2 17*  --   --   O2SAT  --  100 100    CBC Recent Labs  Lab 08/27/21 1425 08/27/21 1505 08/28/21 0256  HGB 8.5* 9.2* 7.6*  HCT 28.6* 27.0* 24.8*  WBC 8.7  --  6.5  PLT 289  --  228    COAGULATION No results for input(s): "INR" in the last 168 hours.  CARDIAC  No results for input(s): "TROPONINI" in the last 168 hours. No results for input(s): "PROBNP" in the last 168 hours.  CHEMISTRY Recent Labs  Lab 08/27/21 1425 08/27/21 1505 08/27/21 2235 08/28/21 0256  NA 131* 130* 138 138  138  K >7.5* 7.9* 5.0 4.4  4.4  CL 105 107 105 103  103  CO2 18*  --  26 27  28   GLUCOSE 211* 232* 197* 178*  181*  BUN 70* 72* 50* 39*  39*  CREATININE 2.73* 2.90* 2.01* 1.76*  1.77*  CALCIUM 9.0  --  9.5 8.8*  8.9  MG  --   --   --  2.3  PHOS   --   --  4.2 3.6   Estimated Creatinine Clearance: 24.4 mL/min (A) (by C-G formula based on SCr of 1.77 mg/dL (H)).   LIVER Recent Labs  Lab 08/27/21 1425 08/27/21 2235 08/28/21 0256  AST 20  --   --   ALT 20  --   --   ALKPHOS 58  --   --   BILITOT 0.9  --   --   PROT 7.8  --   --   ALBUMIN 3.7 3.1* 3.0*     INFECTIOUS Recent Labs  Lab 08/27/21 1724 08/28/21 0256  LATICACIDVEN 2.0* 1.8     ENDOCRINE CBG (last 3)  Recent Labs    08/27/21 2010 08/27/21 2329 08/28/21 0333  GLUCAP 212* 172* 178*         IMAGING x48h  - image(s) personally visualized  -   highlighted in bold 08/30/21 RENAL  Result Date: 08/27/2021 CLINICAL DATA:  08/29/2021.  Acute kidney injury. EXAM: RENAL / URINARY TRACT ULTRASOUND COMPLETE COMPARISON:  Ultrasound renal 11/21/2020 FINDINGS: Limited evaluation due to motion artifact. Right Kidney: Renal measurements: 9.5 x 4.2 x 5.1 cm = volume: 108 mL. Echogenicity within normal limits. No mass or hydronephrosis visualized. Left Kidney: Renal measurements: 9.7 x 5.1 x 5 cm = volume: 129 mL. Echogenicity within normal limits. No mass or hydronephrosis visualized. Urinary bladder: Not visualized. Other: None. IMPRESSION: 1. Unremarkable renal ultrasound with limited evaluation due to respiratory motion artifact. 2. Urinary bladder not visualized. Electronically Signed   By: 01/21/2021 M.D.   On: 08/27/2021 18:59   DG Chest 1 View  Result Date: 08/27/2021 CLINICAL DATA:  Central line placement EXAM: CHEST  1 VIEW COMPARISON:  Same day radiograph FINDINGS: Interval placement of right IJ approach central venous catheter with distal tip terminating at the level of the distal SVC. Stable cardiomegaly. Pulmonary vascular congestion with bilateral interstitial prominence, similar to prior. No pneumothorax. IMPRESSION: Interval placement of right IJ approach central venous catheter with distal tip terminating at the level of the distal SVC. No pneumothorax.  Electronically Signed   By: 08/29/2021 D.O.   On: 08/27/2021 17:21   DG Chest Portable 1 View  Result Date: 08/27/2021 CLINICAL DATA:  Dyspnea EXAM: PORTABLE CHEST 1 VIEW COMPARISON:  11/17/2020 FINDINGS: Transverse diameter of heart is increased. Central pulmonary vessels are  prominent. There are no signs of alveolar pulmonary edema. There is slight prominence of interstitial markings in the parahilar regions. There is no focal pulmonary consolidation. Left lateral CP angle is indistinct which may be due to chest wall attenuation or pleural thickening or small effusion. There is no pneumothorax. IMPRESSION: Cardiomegaly. There is prominence of central pulmonary vessels suggesting possible mild CHF. Subtle increase in interstitial markings in the parahilar regions may suggest mild interstitial edema. Electronically Signed   By: Elmer Picker M.D.   On: 08/27/2021 15:00

## 2021-08-28 NOTE — Progress Notes (Signed)
PHARMACY - PHYSICIAN COMMUNICATION CRITICAL VALUE ALERT - BLOOD CULTURE IDENTIFICATION (BCID)  Virginia Summers is an 81 y.o. female who presented to [facility] on 08/27/2021 with a chief complaint of hyperkalemia, AKI, and respiratory distress now on CRRT.    Assessment:  2/4 bottles (aerobic) growing GPC in clusters. BCID showing S. Epi (no resistance)  Name of physician (or Provider) Contacted: Dr. Marchelle Gearing  Current antibiotics: Cefepime 2g IV q12 hours for possible UTI  Changes to prescribed antibiotics recommended:  Unsure if organism is a contaminant as it is growing in two separate bottles. Currently on cefepime which would provide adequate coverage. Repeat blood cultures drawn 6/17 are pending.  Continue cefepime for now and follow-up repeat blood cultures.   Results for orders placed or performed during the hospital encounter of 08/27/21  Blood Culture ID Panel (Reflexed) (Collected: 08/27/2021  5:16 PM)  Result Value Ref Range   Enterococcus faecalis NOT DETECTED NOT DETECTED   Enterococcus Faecium NOT DETECTED NOT DETECTED   Listeria monocytogenes NOT DETECTED NOT DETECTED   Staphylococcus species DETECTED (A) NOT DETECTED   Staphylococcus aureus (BCID) NOT DETECTED NOT DETECTED   Staphylococcus epidermidis DETECTED (A) NOT DETECTED   Staphylococcus lugdunensis NOT DETECTED NOT DETECTED   Streptococcus species NOT DETECTED NOT DETECTED   Streptococcus agalactiae NOT DETECTED NOT DETECTED   Streptococcus pneumoniae NOT DETECTED NOT DETECTED   Streptococcus pyogenes NOT DETECTED NOT DETECTED   A.calcoaceticus-baumannii NOT DETECTED NOT DETECTED   Bacteroides fragilis NOT DETECTED NOT DETECTED   Enterobacterales NOT DETECTED NOT DETECTED   Enterobacter cloacae complex NOT DETECTED NOT DETECTED   Escherichia coli NOT DETECTED NOT DETECTED   Klebsiella aerogenes NOT DETECTED NOT DETECTED   Klebsiella oxytoca NOT DETECTED NOT DETECTED   Klebsiella pneumoniae NOT DETECTED NOT  DETECTED   Proteus species NOT DETECTED NOT DETECTED   Salmonella species NOT DETECTED NOT DETECTED   Serratia marcescens NOT DETECTED NOT DETECTED   Haemophilus influenzae NOT DETECTED NOT DETECTED   Neisseria meningitidis NOT DETECTED NOT DETECTED   Pseudomonas aeruginosa NOT DETECTED NOT DETECTED   Stenotrophomonas maltophilia NOT DETECTED NOT DETECTED   Candida albicans NOT DETECTED NOT DETECTED   Candida auris NOT DETECTED NOT DETECTED   Candida glabrata NOT DETECTED NOT DETECTED   Candida krusei NOT DETECTED NOT DETECTED   Candida parapsilosis NOT DETECTED NOT DETECTED   Candida tropicalis NOT DETECTED NOT DETECTED   Cryptococcus neoformans/gattii NOT DETECTED NOT DETECTED   Methicillin resistance mecA/C NOT DETECTED NOT DETECTED   Rexford Maus, PharmD 08/28/2021 3:13 PM

## 2021-08-28 NOTE — Progress Notes (Signed)
Zoll rhythm strip photo placed in media section of epic and physical copy in patient's chart

## 2021-08-29 ENCOUNTER — Telehealth: Payer: Self-pay | Admitting: Internal Medicine

## 2021-08-29 DIAGNOSIS — R001 Bradycardia, unspecified: Secondary | ICD-10-CM | POA: Diagnosis not present

## 2021-08-29 DIAGNOSIS — E875 Hyperkalemia: Secondary | ICD-10-CM | POA: Diagnosis not present

## 2021-08-29 DIAGNOSIS — N19 Unspecified kidney failure: Secondary | ICD-10-CM | POA: Diagnosis not present

## 2021-08-29 DIAGNOSIS — I442 Atrioventricular block, complete: Secondary | ICD-10-CM | POA: Diagnosis not present

## 2021-08-29 LAB — COMPREHENSIVE METABOLIC PANEL
ALT: 22 U/L (ref 0–44)
AST: 27 U/L (ref 15–41)
Albumin: 2.8 g/dL — ABNORMAL LOW (ref 3.5–5.0)
Alkaline Phosphatase: 44 U/L (ref 38–126)
Anion gap: 5 (ref 5–15)
BUN: 24 mg/dL — ABNORMAL HIGH (ref 8–23)
CO2: 28 mmol/L (ref 22–32)
Calcium: 8 mg/dL — ABNORMAL LOW (ref 8.9–10.3)
Chloride: 104 mmol/L (ref 98–111)
Creatinine, Ser: 1.57 mg/dL — ABNORMAL HIGH (ref 0.44–1.00)
GFR, Estimated: 33 mL/min — ABNORMAL LOW (ref >60)
Glucose, Bld: 145 mg/dL — ABNORMAL HIGH (ref 70–99)
Potassium: 4.4 mmol/L (ref 3.5–5.1)
Sodium: 137 mmol/L (ref 135–145)
Total Bilirubin: 0.3 mg/dL (ref 0.3–1.2)
Total Protein: 6.5 g/dL (ref 6.5–8.1)

## 2021-08-29 LAB — RENAL FUNCTION PANEL
Albumin: 3 g/dL — ABNORMAL LOW (ref 3.5–5.0)
Anion gap: 7 (ref 5–15)
BUN: 33 mg/dL — ABNORMAL HIGH (ref 8–23)
CO2: 29 mmol/L (ref 22–32)
Calcium: 8.5 mg/dL — ABNORMAL LOW (ref 8.9–10.3)
Chloride: 102 mmol/L (ref 98–111)
Creatinine, Ser: 1.94 mg/dL — ABNORMAL HIGH (ref 0.44–1.00)
GFR, Estimated: 26 mL/min — ABNORMAL LOW (ref >60)
Glucose, Bld: 124 mg/dL — ABNORMAL HIGH (ref 70–99)
Phosphorus: 4.6 mg/dL (ref 2.5–4.6)
Potassium: 4.5 mmol/L (ref 3.5–5.1)
Sodium: 138 mmol/L (ref 135–145)

## 2021-08-29 LAB — CBC
HCT: 26 % — ABNORMAL LOW (ref 36.0–46.0)
Hemoglobin: 7.8 g/dL — ABNORMAL LOW (ref 12.0–15.0)
MCH: 26 pg (ref 26.0–34.0)
MCHC: 30 g/dL (ref 30.0–36.0)
MCV: 86.7 fL (ref 80.0–100.0)
Platelets: 238 10*3/uL (ref 150–400)
RBC: 3 MIL/uL — ABNORMAL LOW (ref 3.87–5.11)
RDW: 16.2 % — ABNORMAL HIGH (ref 11.5–15.5)
WBC: 11 10*3/uL — ABNORMAL HIGH (ref 4.0–10.5)
nRBC: 0.3 % — ABNORMAL HIGH (ref 0.0–0.2)

## 2021-08-29 LAB — GLUCOSE, CAPILLARY
Glucose-Capillary: 109 mg/dL — ABNORMAL HIGH (ref 70–99)
Glucose-Capillary: 124 mg/dL — ABNORMAL HIGH (ref 70–99)
Glucose-Capillary: 139 mg/dL — ABNORMAL HIGH (ref 70–99)
Glucose-Capillary: 140 mg/dL — ABNORMAL HIGH (ref 70–99)
Glucose-Capillary: 172 mg/dL — ABNORMAL HIGH (ref 70–99)
Glucose-Capillary: 256 mg/dL — ABNORMAL HIGH (ref 70–99)

## 2021-08-29 LAB — PROTIME-INR
INR: 1.1 (ref 0.8–1.2)
Prothrombin Time: 13.9 seconds (ref 11.4–15.2)

## 2021-08-29 LAB — PHOSPHORUS
Phosphorus: 1.9 mg/dL — ABNORMAL LOW (ref 2.5–4.6)
Phosphorus: 1.9 mg/dL — ABNORMAL LOW (ref 2.5–4.6)

## 2021-08-29 LAB — MAGNESIUM: Magnesium: 2.6 mg/dL — ABNORMAL HIGH (ref 1.7–2.4)

## 2021-08-29 LAB — APTT: aPTT: 125 seconds — ABNORMAL HIGH (ref 24–36)

## 2021-08-29 LAB — TROPONIN I (HIGH SENSITIVITY): Troponin I (High Sensitivity): 37 ng/L — ABNORMAL HIGH (ref <18)

## 2021-08-29 LAB — MRSA NEXT GEN BY PCR, NASAL: MRSA by PCR Next Gen: NOT DETECTED

## 2021-08-29 MED ORDER — DOPAMINE-DEXTROSE 3.2-5 MG/ML-% IV SOLN
2.5000 ug/kg/min | INTRAVENOUS | Status: DC
  Administered 2021-08-29: 5 ug/kg/min via INTRAVENOUS
  Filled 2021-08-29: qty 250

## 2021-08-29 MED ORDER — AMLODIPINE BESYLATE 10 MG PO TABS
10.0000 mg | ORAL_TABLET | Freq: Every day | ORAL | Status: DC
  Administered 2021-08-29: 10 mg via ORAL
  Filled 2021-08-29 (×2): qty 1

## 2021-08-29 MED ORDER — CEFAZOLIN SODIUM-DEXTROSE 2-4 GM/100ML-% IV SOLN
2.0000 g | Freq: Two times a day (BID) | INTRAVENOUS | Status: DC
  Administered 2021-08-29 – 2021-08-30 (×4): 2 g via INTRAVENOUS
  Filled 2021-08-29 (×4): qty 100

## 2021-08-29 MED ORDER — SODIUM PHOSPHATES 45 MMOLE/15ML IV SOLN
15.0000 mmol | Freq: Once | INTRAVENOUS | Status: AC
  Administered 2021-08-29: 15 mmol via INTRAVENOUS
  Filled 2021-08-29: qty 5

## 2021-08-29 MED ORDER — SODIUM CHLORIDE 0.9 % IV SOLN
2.0000 g | INTRAVENOUS | Status: DC
Start: 2021-08-29 — End: 2021-08-29

## 2021-08-29 NOTE — Progress Notes (Addendum)
[facility] Progress Note  Subjective:  net I/O yest was - 2.4 L. CXR yest showed stable mild vasc congestion. Labs stable today w/ creat 1.6.  CRRT dc'd earlier this am.   Vitals:   08/29/21 0430 08/29/21 0500 08/29/21 0530 08/29/21 0600  BP: (!) 159/35  (!) 154/42 (!) 170/46  Pulse: 78 81 80 82  Resp: _0 Temp:      TempSrc:      SpO2: 99% 99% 100% 99%  Weight:      Height:        Exam: Gen alert, no distress, nasal O2 No jvd or bruits Chest clear bilat to bases RRR no MRG Abd soft ntnd no mass or ascites +bs Ext 1+ pretib edema Neuro is alert, Ox 3 , nf     Home meds include - amlodipine, lipitor, symbicort, celexa, hygroton, cymbalta, tricor, hydralazine 75 tid, synthroid, metformin, Kdur 10 qd, xarelto, trazodone, valsartan 320 qd        Date              Creat               eGFR   2018              0.95- 1.12   2021              1.08- 1.52        32- 57 ml/min     Feb 2022       1.19   Sept 2022      2.84 >> 0.96    16- 60 ml/min   08/27/2021      2.73, 2.90     Assessment/ Plan: AKI on CKD 3a - b/l creatinine is 0.9- 1.5, eGFR 32- 60 from 2021/ 2022. Creat here is 2.7 in setting of recent addition of Cox II inhibitor and recent escalation of BP meds (possibly losartan) in OP setting. Avoid nsaids/ COX 2 inhibitors and acei/ ARB for now. CRRT started on 6/16. AKI was mild and CRRT was done primarily for resolution of severe hyperkalemia in setting of heart block. CRRT dc'd today. Hopefully will not need further dialysis. Please keep temp HD cath in place for now. Will follow.  Severe hyperkalemia - K+ > 7.5. Improved now after CRRT and other measures, down to 4- 4.5 range. CRRT dc'd today.  HTN - avoid ARB/ acei's for now.  Hx of DVT/ PE   Virginia Summers 08/29/2021, 6:39 AM   Recent Labs  Lab 08/28/21 0256 08/28/21 1453 08/29/21 0330  HGB 7.6*  --  7.8*  ALBUMIN 3.0* 3.2* 2.8*  CALCIUM 8.8*  8.9 8.7* 8.0*  PHOS 3.6 2.5 1.9*   CREATININE 1.76*  1.77* 1.51* 1.57*  K 4.4  4.4 4.1 4.4    Inpatient medications:  Chlorhexidine Gluconate Cloth  6 each Topical Daily   DULoxetine  60 mg Oral QHS   heparin injection (subcutaneous)  5,000 Units Subcutaneous Q8H   insulin aspart  0-6 Units Subcutaneous Q4H   levothyroxine  25 mcg Oral QAC breakfast   mouth rinse  15 mL Mouth Rinse 4 times per day   pantoprazole (PROTONIX) IV  40 mg Intravenous QHS     prismasol BGK 4/2.5 500 mL/hr at 08/29/21 0300    prismasol BGK 4/2.5 500 mL/hr at 08/29/21 0300   ceFEPime (MAXIPIME) IV Stopped (08/28/21 2145)   heparin 10,000 units/ 20 mL infusion syringe 500 Units/hr (08/29/21 0500)  prismasol BGK 4/2.5 1,500 mL/hr at 08/29/21 0142   alteplase, docusate sodium, heparin, magic mouthwash w/lidocaine, mouth rinse, polyethylene glycol, sodium chloride

## 2021-08-29 NOTE — Progress Notes (Signed)
Pharmacy Antibiotic Note  Virginia Summers is a 81 y.o. female admitted on 08/27/2021 with significant hyperkalemia, AKI and respiratory distress requring CRRT. Blood cultures with 2/4 bottles (2/2 sets) staph epi, no resistance detected. Patient will likely require PPM placement. Patient has been on cefepime. Pharmacy has been consulted to change to cefazolin.  Plan: Cefazolin 2 g IV q12h based on current renal function  Pharmacy to continue to follow renal function, cultures and clinical progress for antibiotic dosage adjustments and de-escalation as indicated  Height: 5' (152.4 cm) Weight: 86.2 kg (190 lb 0.6 oz) IBW/kg (Calculated) : 45.5  Temp (24hrs), Avg:98.5 F (36.9 C), Min:97.7 F (36.5 C), Max:99.1 F (37.3 C)  Recent Labs  Lab 08/27/21 1425 08/27/21 1505 08/27/21 1724 08/27/21 2235 08/28/21 0256 08/28/21 1453 08/29/21 0330  WBC 8.7  --   --   --  6.5  --  11.0*  CREATININE 2.73* 2.90*  --  2.01* 1.76*  1.77* 1.51* 1.57*  LATICACIDVEN  --   --  2.0*  --  1.8  --   --      Estimated Creatinine Clearance: 27.4 mL/min (A) (by C-G formula based on SCr of 1.57 mg/dL (H)).    Allergies  Allergen Reactions   Penicillins Swelling and Rash    Did it involve swelling of the face/tongue/throat, SOB, or low BP? No Did it involve sudden or severe rash/hives, skin peeling, or any reaction on the inside of your mouth or nose? No Did you need to seek medical attention at a hospital or doctor's office? No When did it last happen?    childhood   If all above answers are "NO", may proceed with cephalosporin use.   Sulfa Drugs Cross Reactors Swelling and Rash    Antimicrobials this admission: Cefazolin 6/18 >> Cefepime 6/17 >> 6/18   Microbiology results: 6/16 BCx: 2/4 (2/2 sets) staph epi, no resistance 6/16 UCx: 100k GNR    Thank you for allowing pharmacy to be a part of this patient's care.  Pricilla Riffle, PharmD, BCPS Clinical Pharmacist 08/29/2021 8:59 AM

## 2021-08-29 NOTE — Progress Notes (Signed)
Pt off BIPAP at this time.  

## 2021-08-29 NOTE — Progress Notes (Signed)
eLink Physician-Brief Progress Note Patient Name: Virginia Summers DOB: [birth year] MRN: [id number]   Date of Service  08/29/2021  HPI/Events of Note  Notified by RN that patient's HR was dropping down into the 30s-40s.  She is scheduled for permanent pacemaker placement tomorrow.  External pacer was discontinued earlier today.   External pacer turned back on with rate of 58, MA of 9 and patient is tolerating this.  Dopamine at 64mcg/kg/min.   BP 170s-190s, HR 50s-60s  eICU Interventions  Continue with external pacer.  Decrease dopamine rate to 2.68mcg/kg/min.     Intervention Category Intermediate Interventions: Arrhythmia - evaluation and management  Larinda Buttery 08/29/2021, 10:42 PM

## 2021-08-29 NOTE — Progress Notes (Addendum)
Filter needed to be changed, ran the last bags almost empty, orders obtain from Nephrology to stop CRRT.  125 ml out 217 ml blood returned.

## 2021-08-29 NOTE — Progress Notes (Signed)
Per Dr. Lubertha Basque request, patient added to add-on board for [facility] tomorrow; pre-device-implantation orders written per his instruction; he also clarifies to keep usual abx on board on-call. He discussed procedure and consent with patient.

## 2021-08-29 NOTE — Telephone Encounter (Signed)
Front desk  Pleae give post hospital followup/Sleep apnea eval consult visti to APP in approx 1 month  Thanks    SIGNATURE    Dr. Kalman Shan, M.D., F.C.C.P,  Pulmonary and Critical Care Medicine Staff Physician, [facility] Center Director - Interstitial Lung Disease  Program  Medical Director - [facility] Pulmonary Fibrosis [facility] [place], [place], [address]  NPI Number:  NPI #[id number] [facility] Number: [id number]  Pager: [phone], If no answer  -> Check AMION or Try [phone] Telephone (clinical office): [phone] Telephone (research): [phone]  8:51 AM 08/29/2021

## 2021-08-29 NOTE — Consult Note (Addendum)
Cardiology Consultation:   Patient ID: Virginia Summers MRN: [id number]; DOB: 22-Jan-1941  Admit date: 08/27/2021 Date of Consult: 08/29/2021  PCP:  Eather Colas, FNP   [facility] Providers Cardiologist:  Kristeen Miss, MD        Patient Profile:   Virginia Summers is a 81 y.o. female with a hx of renal failure who is being seen 08/29/2021 for the evaluation of complete heart block at the request of Dr. Rennis Golden.  History of Present Illness:   Virginia Summers is a pleasant 81 yo woman who presented with acute renal failure and hyperkalemia and CHB. Her renal function and potassium have improved. It was thought that her heart conduction would but she has continued to have CHB with periods of prolonged transcutaneous pacing. She has baseline LBBB and first degree AV block. Her creatine has improved. She had a right IJ dialysis catheter placed. She had blood cultures that were thought to be contaminants growing Staph epi.    Past Medical History:  Diagnosis Date   Anticoagulant long-term use    Chest pain    Diabetes mellitus    Diverticulitis    DVT (deep venous thrombosis) (HCC)    BILATERAL, March 2012   GERD (gastroesophageal reflux disease)    HTN (hypertension)    Hypothyroidism    Pulmonary embolism (HCC)     Past Surgical History:  Procedure Laterality Date   APPENDECTOMY  AGE 54   BACK SURGERY  2000   DISC   PARTIAL HYSTERECTOMY  AGE 60's APPROX.   SECONDARY INTRAOCULAR LENSE IMPLANTATION  2005,2006   OU   SHOULDER SURGERY  5-6 YRS OLD   [facility]INS     Home Medications:  Prior to Admission medications   Medication Sig Start Date End Date Taking? Authorizing Provider  acetaminophen (TYLENOL) 500 MG tablet Take 1,000 mg by mouth every 6 (six) hours as needed for moderate pain or headache.   Yes [provider]  albuterol (VENTOLIN HFA) 108 (90 Base) MCG/ACT inhaler Inhale 2 puffs into the lungs every 4 (four) hours as needed for wheezing or shortness of breath. For  shortness of breath.   Yes [provider]  amLODipine (NORVASC) 10 MG tablet Take 1 tablet (10 mg total) by mouth daily. 11/25/20 08/27/21 Yes Lanae Boast, MD  Artificial Tear Ointment (DRY EYES OP) Apply 1 drop to eye 2 (two) times daily as needed (dry eyes).   Yes [provider]  budesonide-formoterol (SYMBICORT) 80-4.5 MCG/ACT inhaler Inhale 2 puffs into the lungs 2 (two) times daily.   Yes [provider]  chlorthalidone (HYGROTON) 25 MG tablet Take 1 tablet by mouth once daily Patient taking differently: Take 25 mg by mouth daily. 07/13/21  Yes Nahser, Deloris Ping, MD  DULoxetine (CYMBALTA) 60 MG capsule Take 60 mg by mouth at bedtime. 05/30/19  Yes [provider]  fenofibrate (TRICOR) 145 MG tablet Take 1 tablet by mouth once daily Patient taking differently: Take 145 mg by mouth daily. 07/13/21  Yes Nahser, Deloris Ping, MD  fexofenadine (ALLEGRA) 180 MG tablet Take 180 mg by mouth daily.   Yes [provider]  fluticasone (FLONASE) 50 MCG/ACT nasal spray Place 2 sprays into both nostrils daily as needed for allergies or rhinitis. 09/26/18  Yes [provider]  hydrALAZINE (APRESOLINE) 25 MG tablet TAKE 3 TABLETS BY MOUTH THREE TIMES DAILY . APPOINTMENT REQUIRED FOR FUTURE REFILLS Patient taking differently: Take 25 mg by mouth 3 (three) times daily. 07/13/21  Yes  Nahser, Wonda Cheng, MD  levothyroxine (SYNTHROID, LEVOTHROID) 25 MCG tablet TAKE 1 TABLET (25 MCG TOTAL) BY MOUTH DAILY. Patient taking differently: Take 25 mcg by mouth daily before breakfast. 03/08/13  Yes Nahser, Wonda Cheng, MD  meloxicam (MOBIC) 15 MG tablet Take 15 mg by mouth daily. 08/21/21  Yes [provider]  metFORMIN (GLUCOPHAGE-XR) 500 MG 24 hr tablet Take 500 mg by mouth daily with breakfast.   Yes [provider]  potassium chloride (KLOR-CON) 10 MEQ tablet Take 1 tablet (10 mEq total) by mouth daily. 08/18/21  Yes Nahser, Wonda Cheng, MD  rivaroxaban (XARELTO) 10 MG  TABS tablet Take 1 tablet by mouth once daily Patient taking differently: Take 10 mg by mouth daily. 08/18/21  Yes Nahser, Wonda Cheng, MD  traZODone (DESYREL) 50 MG tablet Take 50 mg by mouth at bedtime as needed for sleep.  09/18/17  Yes [provider]  valsartan (DIOVAN) 320 MG tablet Take 1 tablet (320 mg total) by mouth daily. 08/18/21  Yes Nahser, Wonda Cheng, MD  glucose blood (ONETOUCH ULTRA) test strip USE 1 STRIP TO CHECK GLUCOSE TWICE DAILY OR  AS  DIRECTED. 11/19/20   [provider]    Inpatient Medications: Scheduled Meds:  amLODipine  10 mg Oral Daily   Chlorhexidine Gluconate Cloth  6 each Topical Daily   DULoxetine  60 mg Oral QHS   heparin injection (subcutaneous)  5,000 Units Subcutaneous Q8H   insulin aspart  0-6 Units Subcutaneous Q4H   levothyroxine  25 mcg Oral QAC breakfast   mouth rinse  15 mL Mouth Rinse 4 times per day   pantoprazole (PROTONIX) IV  40 mg Intravenous QHS   Continuous Infusions:   prismasol BGK 4/2.5 500 mL/hr at 08/29/21 0300    prismasol BGK 4/2.5 500 mL/hr at 08/29/21 0300    ceFAZolin (ANCEF) IV Stopped (08/29/21 1053)   DOPamine 5 mcg/kg/min (08/29/21 1109)   heparin 10,000 units/ 20 mL infusion syringe 500 Units/hr (08/29/21 0500)   prismasol BGK 4/2.5 1,500 mL/hr at 08/29/21 0142   sodium phosphate 15 mmol in dextrose 5 % 250 mL infusion 15 mmol (08/29/21 1106)   PRN Meds: alteplase, docusate sodium, heparin, magic mouthwash w/lidocaine, mouth rinse, polyethylene glycol, sodium chloride  Allergies:    Allergies  Allergen Reactions   Penicillins Swelling and Rash    Did it involve swelling of the face/tongue/throat, SOB, or low BP? No Did it involve sudden or severe rash/hives, skin peeling, or any reaction on the inside of your mouth or nose? No Did you need to seek medical attention at a hospital or doctor's office? No When did it last happen?    childhood   If all above answers are "NO", may proceed with cephalosporin use.    Sulfa Drugs Cross Reactors Swelling and Rash    Social History:   Social History   Socioeconomic History   Marital status: Married    Spouse name: Not on file   Number of children: Not on file   Years of education: Not on file   Highest education level: Not on file  Occupational History   Not on file  Tobacco Use   Smoking status: Former    Packs/day: 0.50    Years: 30.00    Total pack years: 15.00    Types: Cigarettes    Quit date: 03/15/2003    Years since quitting: 18.4   Smokeless tobacco: Never  Substance and Sexual Activity   Alcohol use: No  Drug use: No   Sexual activity: Not on file  Other Topics Concern   Not on file  Social History Narrative   Not on file   Social Determinants of Health   Financial Resource Strain: Not on file  Food Insecurity: Not on file  Transportation Needs: Not on file  Physical Activity: Not on file  Stress: Not on file  Social Connections: Not on file  Intimate Partner Violence: Not on file    Family History:    Family History  Problem Relation Age of Onset   Transient ischemic attack Father    Coronary artery disease Father    Stroke Mother    Diabetes Mother    Diabetes Sister    Hypertension Daughter      ROS:  Please see the history of present illness.   All other ROS reviewed and negative.     Physical Exam/Data:   Vitals:   08/29/21 0800 08/29/21 0900 08/29/21 1000 08/29/21 1030  BP: (!) 178/70 (!) 155/45 (!) 178/48   Pulse: (!) 58 (!) 58 (!) 58 (!) 58  Resp: 19 13 17 17   Temp: 98.1 F (36.7 C)     TempSrc: Axillary     SpO2: 98% 92% 96% 96%  Weight:      Height:        Intake/Output Summary (Last 24 hours) at 08/29/2021 1207 Last data filed at 08/29/2021 0800 Gross per 24 hour  Intake 1808 ml  Output 3455.8 ml  Net -1647.8 ml      08/29/2021    2:16 AM 08/28/2021   11:30 PM 08/27/2021    5:00 PM  Last 3 Weights  Weight (lbs) 190 lb 0.6 oz 190 lb 0.6 oz 190 lb 14.7 oz  Weight (kg) 86.2 kg  86.2 kg 86.6 kg     Body mass index is 37.11 kg/m.  General:  obese, chronically ill appearing, in no acute distress HEENT: normal Neck: no JVD Vascular: No carotid bruits; Distal pulses 2+ bilaterally Cardiac:  normal S1, S2; RRR; no murmur  Lungs:  clear to auscultation bilaterally, no wheezing, rhonchi or rales  Abd: soft, nontender, no hepatomegaly  Ext: no edema Musculoskeletal:  No deformities, BUE and BLE strength normal and equal Skin: warm and dry  Neuro:  CNs 2-12 intact, no focal abnormalities noted Psych:  Normal affect   EKG:  The EKG was personally reviewed and demonstrates:  nsr with LBBB Telemetry:  Telemetry was personally reviewed and demonstrates:  nsr with LBBB  Relevant CV Studies: reviewed  Laboratory Data:  High Sensitivity Troponin:   Recent Labs  Lab 08/27/21 1425 08/27/21 1716 08/29/21 0354  TROPONINIHS 14 24* 37*     Chemistry Recent Labs  Lab 08/28/21 0256 08/28/21 1453 08/29/21 0330  NA 138  138 137 137  K 4.4  4.4 4.1 4.4  CL 103  103 101 104  CO2 27  28 28 28   GLUCOSE 178*  181* 193* 145*  BUN 39*  39* 25* 24*  CREATININE 1.76*  1.77* 1.51* 1.57*  CALCIUM 8.8*  8.9 8.7* 8.0*  MG 2.3  --  2.6*  GFRNONAA 29*  29* 35* 33*  ANIONGAP 8  7 8 5     Recent Labs  Lab 08/27/21 1425 08/27/21 2235 08/28/21 0256 08/28/21 1453 08/29/21 0330  PROT 7.8  --   --   --  6.5  ALBUMIN 3.7   < > 3.0* 3.2* 2.8*  AST 20  --   --   --  27  ALT 20  --   --   --  22  ALKPHOS 58  --   --   --  44  BILITOT 0.9  --   --   --  0.3   < > = values in this interval not displayed.   Lipids No results for input(s): "CHOL", "TRIG", "HDL", "LABVLDL", "LDLCALC", "CHOLHDL" in the last 168 hours.  Hematology Recent Labs  Lab 08/27/21 1425 08/27/21 1505 08/28/21 0256 08/29/21 0330  WBC 8.7  --  6.5 11.0*  RBC 3.28*  --  2.91* 3.00*  HGB 8.5* 9.2* 7.6* 7.8*  HCT 28.6* 27.0* 24.8* 26.0*  MCV 87.2  --  85.2 86.7  MCH 25.9*  --  26.1 26.0   MCHC 29.7*  --  30.6 30.0  RDW 16.8*  --  16.6* 16.2*  PLT 289  --  228 238   Thyroid  Recent Labs  Lab 08/28/21 0256  TSH 0.613  FREET4 1.04    BNP Recent Labs  Lab 08/27/21 1425  BNP 1,166.7*    DDimer  Recent Labs  Lab 08/27/21 1427  DDIMER 1.10*     Radiology/Studies:  ECHOCARDIOGRAM COMPLETE  Result Date: 08/28/2021    ECHOCARDIOGRAM REPORT   Patient Name:   Virginia Summers Date of Exam: 08/28/2021 Medical Rec #:  [id number]    Height:       60.0 in Accession #:    [id number]   Weight:       190.9 lb Date of Birth:  May 01, 1940   BSA:          1.830 m Patient Age:    80 years     BP:           157/63 mmHg Patient Gender: F            HR:           56 bpm. Exam Location:  Inpatient Procedure: 2D Echo, Color Doppler, Cardiac Doppler and Intracardiac            Opacification Agent Indications:    CHF-Acute Diastolic I50.31  History:        Patient has prior history of Echocardiogram examinations, most                 recent 11/18/2020. Arrythmias:LBBB and Bradycardia; Risk                 Factors:Hypertension, Diabetes, Dyslipidemia and Sleep Apnea.                 Acute kidney injury- on CRRT. Externally paced at 55. Chronic                 respiratory failure, home oxygen. Thyroid disease. Pulmonary                 embolism on Eliquis.  Sonographer:    Leta Jungling RDCS Referring Phys: [id number] MURALI RAMASWAMY IMPRESSIONS  1. Septal hypokinesis consistent with ventricular pacing. Left ventricular ejection fraction, by estimation, is 50 to 55%. The left ventricle has low normal function. The left ventricle demonstrates regional wall motion abnormalities (see scoring diagram/findings for description). There is mild concentric left ventricular hypertrophy. Left ventricular diastolic parameters are consistent with Grade I diastolic dysfunction (impaired relaxation). Elevated left ventricular end-diastolic pressure.  2. Right ventricular systolic function is normal. The right ventricular size is  normal. There is moderately elevated pulmonary artery systolic pressure.  3. Left atrial size was mildly dilated.  4. The mitral valve is normal in structure. Trivial mitral valve regurgitation. No evidence of mitral stenosis.  5. The aortic valve is tricuspid. Aortic valve regurgitation is not visualized. No aortic stenosis is present.  6. The inferior vena cava is dilated in size with >50% respiratory variability, suggesting right atrial pressure of 8 mmHg. FINDINGS  Left Ventricle: Septal hypokinesis consistent with ventricular pacing. Left ventricular ejection fraction, by estimation, is 50 to 55%. The left ventricle has low normal function. The left ventricle demonstrates regional wall motion abnormalities. Definity contrast agent was given IV to delineate the left ventricular endocardial borders. The left ventricular internal cavity size was normal in size. There is mild concentric left ventricular hypertrophy. Left ventricular diastolic parameters are consistent with Grade I diastolic dysfunction (impaired relaxation). Elevated left ventricular end-diastolic pressure. Right Ventricle: The right ventricular size is normal. No increase in right ventricular wall thickness. Right ventricular systolic function is normal. There is moderately elevated pulmonary artery systolic pressure. The tricuspid regurgitant velocity is 3.35 m/s, and with an assumed right atrial pressure of 8 mmHg, the estimated right ventricular systolic pressure is 52.9 mmHg. Left Atrium: Left atrial size was mildly dilated. Right Atrium: Right atrial size was normal in size. Pericardium: There is no evidence of pericardial effusion. Mitral Valve: The mitral valve is normal in structure. Trivial mitral valve regurgitation. No evidence of mitral valve stenosis. Tricuspid Valve: The tricuspid valve is normal in structure. Tricuspid valve regurgitation is mild . No evidence of tricuspid stenosis. Aortic Valve: The aortic valve is tricuspid.  Aortic valve regurgitation is not visualized. No aortic stenosis is present. Pulmonic Valve: The pulmonic valve was normal in structure. Pulmonic valve regurgitation is not visualized. No evidence of pulmonic stenosis. Aorta: The aortic root is normal in size and structure. Venous: The inferior vena cava is dilated in size with greater than 50% respiratory variability, suggesting right atrial pressure of 8 mmHg. IAS/Shunts: No atrial level shunt detected by color flow Doppler.  LEFT VENTRICLE PLAX 2D LVIDd:         4.90 cm      Diastology LVIDs:         3.20 cm      LV e' medial:    5.25 cm/s LV PW:         1.10 cm      LV E/e' medial:  20.2 LV IVS:        1.20 cm      LV e' lateral:   6.53 cm/s LVOT diam:     1.60 cm      LV E/e' lateral: 16.2 LV SV:         67 LV SV Index:   37 LVOT Area:     2.01 cm  LV Volumes (MOD) LV vol d, MOD A2C: 118.0 ml LV vol d, MOD A4C: 136.0 ml LV vol s, MOD A2C: 51.9 ml LV vol s, MOD A4C: 61.8 ml LV SV MOD A2C:     66.1 ml LV SV MOD A4C:     136.0 ml LV SV MOD BP:      71.6 ml RIGHT VENTRICLE RV Basal diam:  2.80 cm RV Mid diam:    1.50 cm RV S prime:     13.00 cm/s TAPSE (M-mode): 1.9 cm LEFT ATRIUM             Index        RIGHT ATRIUM           Index LA  diam:        4.00 cm 2.19 cm/m   RA Area:     16.00 cm LA Vol (A2C):   41.0 ml 22.40 ml/m  RA Volume:   36.20 ml  19.78 ml/m LA Vol (A4C):   57.6 ml 31.47 ml/m LA Biplane Vol: 51.5 ml 28.14 ml/m  AORTIC VALVE LVOT Vmax:   155.00 cm/s LVOT Vmean:  103.000 cm/s LVOT VTI:    0.335 m  AORTA Ao Root diam: 2.70 cm Ao Asc diam:  2.50 cm MITRAL VALVE                TRICUSPID VALVE MV Area (PHT): 3.08 cm     TR Peak grad:   44.9 mmHg MV Decel Time: 246 msec     TR Vmax:        335.00 cm/s MV E velocity: 106.00 cm/s MV A velocity: 122.00 cm/s  SHUNTS MV E/A ratio:  0.87         Systemic VTI:  0.34 m                             Systemic Diam: 1.60 cm Skeet Latch MD Electronically signed by Skeet Latch MD Signature Date/Time:  08/28/2021/12:19:11 PM    Final    DG Chest Port 1 View  Result Date: 08/28/2021 CLINICAL DATA:  Respiratory failure. EXAM: PORTABLE CHEST 1 VIEW COMPARISON:  08/27/2021 FINDINGS: Right IJ central venous catheter has tip over the SVC. Lungs are adequately inflated demonstrate mild hazy prominence of the central pulmonary vessels unchanged likely mild vascular congestion. No pneumothorax. Mild stable cardiomegaly. Left base not well evaluated on this exam. Remainder of the exam is unchanged. IMPRESSION: 1. Stable cardiomegaly with suggestion of mild vascular congestion. 2. Right IJ central venous catheter with tip over the SVC. Electronically Signed   By: Marin Olp M.D.   On: 08/28/2021 08:56   US RENAL  Result Date: 08/27/2021 CLINICAL DATA:  [id number].  Acute kidney injury. EXAM: RENAL / URINARY TRACT ULTRASOUND COMPLETE COMPARISON:  Ultrasound renal 11/21/2020 FINDINGS: Limited evaluation due to motion artifact. Right Kidney: Renal measurements: 9.5 x 4.2 x 5.1 cm = volume: 108 mL. Echogenicity within normal limits. No mass or hydronephrosis visualized. Left Kidney: Renal measurements: 9.7 x 5.1 x 5 cm = volume: 129 mL. Echogenicity within normal limits. No mass or hydronephrosis visualized. Urinary bladder: Not visualized. Other: None. IMPRESSION: 1. Unremarkable renal ultrasound with limited evaluation due to respiratory motion artifact. 2. Urinary bladder not visualized. Electronically Signed   By: Iven Finn M.D.   On: 08/27/2021 18:59   DG Chest 1 View  Result Date: 08/27/2021 CLINICAL DATA:  Central line placement EXAM: CHEST  1 VIEW COMPARISON:  Same day radiograph FINDINGS: Interval placement of right IJ approach central venous catheter with distal tip terminating at the level of the distal SVC. Stable cardiomegaly. Pulmonary vascular congestion with bilateral interstitial prominence, similar to prior. No pneumothorax. IMPRESSION: Interval placement of right IJ approach central venous  catheter with distal tip terminating at the level of the distal SVC. No pneumothorax. Electronically Signed   By: Davina Poke D.O.   On: 08/27/2021 17:21   DG Chest Portable 1 View  Result Date: 08/27/2021 CLINICAL DATA:  Dyspnea EXAM: PORTABLE CHEST 1 VIEW COMPARISON:  11/17/2020 FINDINGS: Transverse diameter of heart is increased. Central pulmonary vessels are prominent. There are no signs of alveolar pulmonary edema. There is slight prominence of  interstitial markings in the parahilar regions. There is no focal pulmonary consolidation. Left lateral CP angle is indistinct which may be due to chest wall attenuation or pleural thickening or small effusion. There is no pneumothorax. IMPRESSION: Cardiomegaly. There is prominence of central pulmonary vessels suggesting possible mild CHF. Subtle increase in interstitial markings in the parahilar regions may suggest mild interstitial edema. Electronically Signed   By: Elmer Picker M.D.   On: 08/27/2021 15:00     Assessment and Plan:   CHB - she will need PPM insertion. Her renal function is improved and I would recommend either standard or leadless PPM. Only question is to be sure no evidence of infection. I would like ID service to way in on whether blood cultures a contaminant or not.       Continue dopamine for now. HTN -her bp is acceptable.  Acute on chronic renal failure- her creatinine has improved. Hyperkalemia - resolved.  For questions or updates, please contact [place] Please consult [email or web address] for contact info under    Signed, Cristopher Peru, MD  08/29/2021 12:07 PM

## 2021-08-29 NOTE — Progress Notes (Signed)
PHARMACY NOTE:  ANTIMICROBIAL RENAL DOSAGE ADJUSTMENT  Current antimicrobial regimen includes a mismatch between antimicrobial dosage and estimated renal function.  As per policy approved by the Pharmacy & Therapeutics and Medical Executive Committees, the antimicrobial dosage will be adjusted accordingly.  Current antimicrobial dosage:  cefepime 2 g IV q12h  Indication: UTI  Renal Function:  Estimated Creatinine Clearance: 27.4 mL/min (A) (by C-G formula based on SCr of 1.57 mg/dL (H)).  Previously on CRRT, now off as of this morning. Will adjust cefepime dosing based on CrCl 10-30 ml/min. Follow renal function closely.    Antimicrobial dosage has been changed to:  cefepime 2 g IV q24h    Thank you for allowing pharmacy to be a part of this patient's care.  Pricilla Riffle, PharmD, BCPS Clinical Pharmacist 08/29/2021 8:29 AM

## 2021-08-29 NOTE — Progress Notes (Addendum)
DAILY PROGRESS NOTE   Patient Name: Virginia Summers Date of Encounter: 08/29/2021 Cardiologist: Mertie Moores, MD  Chief Complaint   Off bipap  Patient Profile   Virginia Summers is a 81 y.o. female with a hx of  pulmonary embolism, obesity hypoventilation, chronic resp failure no 2-4L at home,  OSA, HL, HTN, diastolic dysfunction who is being seen 08/27/2021 for the evaluation of bradycardia at the request of Dr Chase Caller.  Subjective   Had partial recovery of underlying rhythm overnight- EKG performed showed sinus rhythm in the 80's with LBBB - but apparently, she developed bradycardia again and required repeat pacing. Weaned off bipap - metabolically improved with CVVHD. Troponin checked overnight- surprisingly only minimally elevated at 37. Echo yesterday showed low normal LVEF 50-55% with incoordinate septal motion.   Objective   Vitals:   08/29/21 0500 08/29/21 0530 08/29/21 0600 08/29/21 0800  BP:  (!) 154/42 (!) 170/46   Pulse: 81 80 82   Resp: 17 19 20    Temp:    98.1 F (36.7 C)  TempSrc:    Axillary  SpO2: 99% 100% 99%   Weight:      Height:        Intake/Output Summary (Last 24 hours) at 08/29/2021 0840 Last data filed at 08/29/2021 0530 Gross per 24 hour  Intake 2141.22 ml  Output 4313.8 ml  Net -2172.58 ml   Filed Weights   08/27/21 1700 08/28/21 2330 08/29/21 0216  Weight: 86.6 kg 86.2 kg 86.2 kg    Physical Exam   General appearance: alert, no distress, and morbidly obese Neck: no carotid bruit, no JVD, and thyroid not enlarged, symmetric, no tenderness/mass/nodules Lungs: diminished breath sounds bibasilar Heart: regular rate and rhythm Abdomen: soft, non-tender; bowel sounds normal; no masses,  no organomegaly Extremities: extremities normal, atraumatic, no cyanosis or edema Pulses: 2+ and symmetric Skin: Skin color, texture, turgor normal. No rashes or lesions Neurologic: Mental status: Alert, oriented, thought content appropriate Psych:  Pleasant  Inpatient Medications    Scheduled Meds:  amLODipine  10 mg Oral Daily   Chlorhexidine Gluconate Cloth  6 each Topical Daily   DULoxetine  60 mg Oral QHS   heparin injection (subcutaneous)  5,000 Units Subcutaneous Q8H   insulin aspart  0-6 Units Subcutaneous Q4H   levothyroxine  25 mcg Oral QAC breakfast   mouth rinse  15 mL Mouth Rinse 4 times per day   pantoprazole (PROTONIX) IV  40 mg Intravenous QHS    Continuous Infusions:   prismasol BGK 4/2.5 500 mL/hr at 08/29/21 0300    prismasol BGK 4/2.5 500 mL/hr at 08/29/21 0300   ceFEPime (MAXIPIME) IV     heparin 10,000 units/ 20 mL infusion syringe 500 Units/hr (08/29/21 0500)   prismasol BGK 4/2.5 1,500 mL/hr at 08/29/21 0142    PRN Meds: alteplase, docusate sodium, heparin, magic mouthwash w/lidocaine, mouth rinse, polyethylene glycol, sodium chloride   Labs   Results for orders placed or performed during the hospital encounter of 08/27/21 (from the past 48 hour(s))  Comprehensive metabolic panel     Status: Abnormal   Collection Time: 08/27/21  2:25 PM  Result Value Ref Range   Sodium 131 (L) 135 - 145 mmol/L   Potassium >7.5 (HH) 3.5 - 5.1 mmol/L    Comment: CRITICAL RESULT CALLED TO, READ BACK BY AND VERIFIED WITH: LEONARD,S @1528  ON 08/27/21 BY LUZOLOP    Chloride 105 98 - 111 mmol/L   CO2 18 (L) 22 - 32 mmol/L  Glucose, Bld 211 (H) 70 - 99 mg/dL    Comment: Glucose reference range applies only to samples taken after fasting for at least 8 hours.   BUN 70 (H) 8 - 23 mg/dL   Creatinine, Ser 2.73 (H) 0.44 - 1.00 mg/dL   Calcium 9.0 8.9 - 10.3 mg/dL   Total Protein 7.8 6.5 - 8.1 g/dL   Albumin 3.7 3.5 - 5.0 g/dL   AST 20 15 - 41 U/L   ALT 20 0 - 44 U/L   Alkaline Phosphatase 58 38 - 126 U/L   Total Bilirubin 0.9 0.3 - 1.2 mg/dL   GFR, Estimated 17 (L) >60 mL/min    Comment: (NOTE) Calculated using the CKD-EPI Creatinine Equation (2021)    Anion gap 8 5 - 15    Comment: Performed at [facility], [place] [address] [place], [place] [address]  Troponin I (High Sensitivity)     Status: None   Collection Time: 08/27/21  2:25 PM  Result Value Ref Range   Troponin I (High Sensitivity) 14 <18 ng/L    Comment: (NOTE) Elevated high sensitivity troponin I (hsTnI) values and significant  changes across serial measurements may suggest ACS but many other  chronic and acute conditions are known to elevate hsTnI results.  Refer to the Links section for chest pain algorithms and additional  guidance. Performed at [facility], [place] [address] [place], [place] [address]   CBC with Differential     Status: Abnormal   Collection Time: 08/27/21  2:25 PM  Result Value Ref Range   WBC 8.7 4.0 - 10.5 K/uL   RBC 3.28 (L) 3.87 - 5.11 MIL/uL   Hemoglobin 8.5 (L) 12.0 - 15.0 g/dL   HCT 28.6 (L) 36.0 - 46.0 %   MCV 87.2 80.0 - 100.0 fL   MCH 25.9 (L) 26.0 - 34.0 pg   MCHC 29.7 (L) 30.0 - 36.0 g/dL   RDW 16.8 (H) 11.5 - 15.5 %   Platelets 289 150 - 400 K/uL   nRBC 0.2 0.0 - 0.2 %   Neutrophils Relative % 83 %   Neutro Abs 7.2 1.7 - 7.7 K/uL   Lymphocytes Relative 11 %   Lymphs Abs 0.9 0.7 - 4.0 K/uL   Monocytes Relative 5 %   Monocytes Absolute 0.5 0.1 - 1.0 K/uL   Eosinophils Relative 0 %   Eosinophils Absolute 0.0 0.0 - 0.5 K/uL   Basophils Relative 0 %   Basophils Absolute 0.0 0.0 - 0.1 K/uL   Immature Granulocytes 1 %   Abs Immature Granulocytes 0.12 (H) 0.00 - 0.07 K/uL    Comment: Performed at [facility], [place] [address], [place], [place] [address]  Brain natriuretic peptide     Status: Abnormal   Collection Time: 08/27/21  2:25 PM  Result Value Ref Range   B Natriuretic Peptide 1,166.7 (H) 0.0 - 100.0 pg/mL    Comment: Performed at [facility], [place] [address], [place], [place] [address]  D-dimer, quantitative     Status: Abnormal   Collection Time: 08/27/21  2:27 PM  Result Value Ref Range   D-Dimer, Quant  1.10 (H) 0.00 - 0.50 ug/mL-FEU    Comment: (NOTE) At the manufacturer cut-off value of 0.5 g/mL FEU, this assay has a negative predictive value of 95-100%.This assay is intended for use in conjunction with a clinical pretest probability (PTP) assessment model to exclude pulmonary embolism (PE) and deep venous thrombosis (DVT)  in outpatients suspected of PE or DVT. Results should be correlated with clinical presentation. Performed at [facility], [place] [address] [place], [place] [address]   I-stat chem 8, ED (not at [facility] or [facility])     Status: Abnormal   Collection Time: 08/27/21  3:05 PM  Result Value Ref Range   Sodium 130 (L) 135 - 145 mmol/L   Potassium 7.9 (HH) 3.5 - 5.1 mmol/L   Chloride 107 98 - 111 mmol/L   BUN 72 (H) 8 - 23 mg/dL   Creatinine, Ser 2.90 (H) 0.44 - 1.00 mg/dL   Glucose, Bld 232 (H) 70 - 99 mg/dL    Comment: Glucose reference range applies only to samples taken after fasting for at least 8 hours.   Calcium, Ion 1.18 1.15 - 1.40 mmol/L   TCO2 17 (L) 22 - 32 mmol/L   Hemoglobin 9.2 (L) 12.0 - 15.0 g/dL   HCT 27.0 (L) 36.0 - 46.0 %   Comment NOTIFIED PHYSICIAN   TSH     Status: None   Collection Time: 08/27/21  3:55 PM  Result Value Ref Range   TSH 2.190 0.350 - 4.500 uIU/mL    Comment: Performed by a 3rd Generation assay with a functional sensitivity of <=0.01 uIU/mL. Performed at [facility], [place] [address], [place], [place] [address]   Troponin I (High Sensitivity)     Status: Abnormal   Collection Time: 08/27/21  5:16 PM  Result Value Ref Range   Troponin I (High Sensitivity) 24 (H) <18 ng/L    Comment: (NOTE) Elevated high sensitivity troponin I (hsTnI) values and significant  changes across serial measurements may suggest ACS but many other  chronic and acute conditions are known to elevate hsTnI results.  Refer to the "Links" section for chest pain algorithms and additional  guidance. Performed at [facility], [place] [address], [place], [place] [address]   Culture, blood (Routine X 2) w Reflex to ID Panel     Status: None (Preliminary result)   Collection Time: 08/27/21  5:16 PM   Specimen: BLOOD  Result Value Ref Range   Specimen Description      BLOOD BLOOD RIGHT FOREARM Performed at [place] [address], [place], [place] [address]    Special Requests      IN PEDIATRIC BOTTLE Blood Culture adequate volume Performed at [place] [address], [place], [place] [address]    Culture  Setup Time      [facility] POSITIVE COCCI IN CLUSTERS IN PEDIATRIC BOTTLE CRITICAL RESULT CALLED TO, READ BACK BY AND VERIFIED WITH: Trude Mcburney [id number] AT 1435 BY CM Performed at [facility], [place] [address], [place], [place] [address]    Culture GRAM POSITIVE COCCI    Report Status PENDING   Culture, blood (Routine X 2) w Reflex to ID Panel     Status: None (Preliminary result)   Collection Time: 08/27/21  5:16 PM   Specimen: BLOOD  Result Value Ref Range   Specimen Description      BLOOD BLOOD RIGHT HAND Performed at [place] [address] [place], [place] [address]    Special Requests      IN PEDIATRIC BOTTLE Blood Culture adequate volume Performed at [facility] [address], [place], [place] [address]    Culture  Setup Time      GRAM POSITIVE COCCI IN CLUSTERS IN PEDIATRIC  BOTTLE CRITICAL VALUE NOTED.  VALUE IS CONSISTENT WITH PREVIOUSLY REPORTED AND CALLED VALUE. Performed at [facility], [address], [place], [place] [address]    Culture GRAM POSITIVE COCCI    Report Status PENDING   Blood Culture ID Panel (Reflexed)     Status: Abnormal   Collection Time: 08/27/21  5:16 PM  Result Value Ref Range   Enterococcus faecalis NOT DETECTED NOT DETECTED   Enterococcus Faecium NOT DETECTED NOT DETECTED   Listeria monocytogenes NOT DETECTED NOT DETECTED    Staphylococcus species DETECTED (A) NOT DETECTED    Comment: CRITICAL RESULT CALLED TO, READ BACK BY AND VERIFIED WITH: PHARMD Lana Fish [id number] AT 1435 BY CM    Staphylococcus aureus (BCID) NOT DETECTED NOT DETECTED   Staphylococcus epidermidis DETECTED (A) NOT DETECTED    Comment: CRITICAL RESULT CALLED TO, READ BACK BY AND VERIFIED WITH: PHARMD Lana Fish [id number] AT 1435 BY CM    Staphylococcus lugdunensis NOT DETECTED NOT DETECTED   Streptococcus species NOT DETECTED NOT DETECTED   Streptococcus agalactiae NOT DETECTED NOT DETECTED   Streptococcus pneumoniae NOT DETECTED NOT DETECTED   Streptococcus pyogenes NOT DETECTED NOT DETECTED   A.calcoaceticus-baumannii NOT DETECTED NOT DETECTED   Bacteroides fragilis NOT DETECTED NOT DETECTED   Enterobacterales NOT DETECTED NOT DETECTED   Enterobacter cloacae complex NOT DETECTED NOT DETECTED   Escherichia coli NOT DETECTED NOT DETECTED   Klebsiella aerogenes NOT DETECTED NOT DETECTED   Klebsiella oxytoca NOT DETECTED NOT DETECTED   Klebsiella pneumoniae NOT DETECTED NOT DETECTED   Proteus species NOT DETECTED NOT DETECTED   Salmonella species NOT DETECTED NOT DETECTED   Serratia marcescens NOT DETECTED NOT DETECTED   Haemophilus influenzae NOT DETECTED NOT DETECTED   Neisseria meningitidis NOT DETECTED NOT DETECTED   Pseudomonas aeruginosa NOT DETECTED NOT DETECTED   Stenotrophomonas maltophilia NOT DETECTED NOT DETECTED   Candida albicans NOT DETECTED NOT DETECTED   Candida auris NOT DETECTED NOT DETECTED   Candida glabrata NOT DETECTED NOT DETECTED   Candida krusei NOT DETECTED NOT DETECTED   Candida parapsilosis NOT DETECTED NOT DETECTED   Candida tropicalis NOT DETECTED NOT DETECTED   Cryptococcus neoformans/gattii NOT DETECTED NOT DETECTED   Methicillin resistance mecA/C NOT DETECTED NOT DETECTED    Comment: Performed at [facility], [address], [place], [place] [address]  Respiratory (~20 pathogens) panel by PCR      Status: None   Collection Time: 08/27/21  5:18 PM   Specimen: Nasopharyngeal Swab; Respiratory  Result Value Ref Range   Adenovirus NOT DETECTED NOT DETECTED   Coronavirus 229E NOT DETECTED NOT DETECTED    Comment: (NOTE) The Coronavirus on the Respiratory Panel, DOES NOT test for the novel  Coronavirus (2019 nCoV)    Coronavirus HKU1 NOT DETECTED NOT DETECTED   Coronavirus NL63 NOT DETECTED NOT DETECTED   Coronavirus OC43 NOT DETECTED NOT DETECTED   Metapneumovirus NOT DETECTED NOT DETECTED   Rhinovirus / Enterovirus NOT DETECTED NOT DETECTED   Influenza A NOT DETECTED NOT DETECTED   Influenza B NOT DETECTED NOT DETECTED   Parainfluenza Virus 1 NOT DETECTED NOT DETECTED   Parainfluenza Virus 2 NOT DETECTED NOT DETECTED   Parainfluenza Virus 3 NOT DETECTED NOT DETECTED   Parainfluenza Virus 4 NOT DETECTED NOT DETECTED   Respiratory Syncytial Virus NOT DETECTED NOT DETECTED   Bordetella pertussis NOT DETECTED NOT DETECTED   Bordetella Parapertussis NOT DETECTED NOT DETECTED   Chlamydophila pneumoniae NOT DETECTED NOT DETECTED  Mycoplasma pneumoniae NOT DETECTED NOT DETECTED    Comment: Performed at [place] [address], [place], [place] [address]  Lactic acid, plasma     Status: Abnormal   Collection Time: 08/27/21  5:24 PM  Result Value Ref Range   Lactic Acid, Venous 2.0 (HH) 0.5 - 1.9 mmol/L    Comment: CRITICAL RESULT CALLED TO, READ BACK BY AND VERIFIED WITH: MAY,B @1831  ON 08/27/21 BY LUZOLOP Performed at [facility], [place] [address] [place], [place] [address]   Blood gas, arterial     Status: Abnormal   Collection Time: 08/27/21  5:45 PM  Result Value Ref Range   FIO2 80 %   Inspiratory PAP 16 cmH2O   Expiratory PAP 8 cmH2O   pH, Arterial 7.28 (L) 7.35 - 7.45   pCO2 arterial 46 32 - 48 mmHg   pO2, Arterial 206 (H) 83 - 108 mmHg   Bicarbonate 21.6 20.0 - 28.0 mmol/L   Acid-base deficit 5.2 (H) 0.0 - 2.0 mmol/L   O2 Saturation 100 %    Patient temperature 37.0    Collection site LEFT RADIAL    Drawn by [id number]    Allens test (pass/fail) PASS PASS    Comment: Performed at [facility], [place] [address], [place], [place] [address]  Glucose, capillary     Status: Abnormal   Collection Time: 08/27/21  5:56 PM  Result Value Ref Range   Glucose-Capillary 243 (H) 70 - 99 mg/dL    Comment: Glucose reference range applies only to samples taken after fasting for at least 8 hours.   Comment 1 Notify RN   Hemoglobin A1c     Status: Abnormal   Collection Time: 08/27/21  6:01 PM  Result Value Ref Range   Hgb A1c MFr Bld 7.6 (H) 4.8 - 5.6 %    Comment: (NOTE) Pre diabetes:          5.7%-6.4%  Diabetes:              >6.4%  Glycemic control for   <7.0% adults with diabetes    Mean Plasma Glucose 171.42 mg/dL    Comment: Performed at [place] [address], [place], [place] [address]  Urine Culture     Status: Abnormal (Preliminary result)   Collection Time: 08/27/21  6:25 PM   Specimen: Urine, Catheterized  Result Value Ref Range   Specimen Description      URINE, CATHETERIZED Performed at [facility], [place] [address], [place], [place] [address]    Special Requests      NONE Performed at [facility], [place] [address] [place], [place] [address]    Culture (A)     >=100,000 COLONIES/mL GRAM NEGATIVE RODS CULTURE REINCUBATED FOR BETTER GROWTH SUSCEPTIBILITIES TO FOLLOW Performed at [facility], [place] [address] [place], [place] [address]    Report Status PENDING   Strep pneumoniae urinary antigen (not at [facility])     Status: None   Collection Time: 08/27/21  6:25 PM  Result Value Ref Range   Strep Pneumo Urinary Antigen NEGATIVE NEGATIVE    Comment:        Infection due to S. pneumoniae cannot be absolutely ruled out since the antigen present may be below the detection limit of the test. Performed at [facility], [address],  [place], [place] [address]   Urinalysis, Complete w Microscopic Urine, Catheterized  Status: Abnormal   Collection Time: 08/27/21  6:26 PM  Result Value Ref Range   Color, Urine YELLOW YELLOW   APPearance CLOUDY (A) CLEAR   Specific Gravity, Urine 1.013 1.005 - 1.030   pH 5.0 5.0 - 8.0   Glucose, UA NEGATIVE NEGATIVE mg/dL   Hgb urine dipstick MODERATE (A) NEGATIVE   Bilirubin Urine NEGATIVE NEGATIVE   Ketones, ur NEGATIVE NEGATIVE mg/dL   Protein, ur 100 (A) NEGATIVE mg/dL   Nitrite NEGATIVE NEGATIVE   Leukocytes,Ua LARGE (A) NEGATIVE   RBC / HPF >50 (H) 0 - 5 RBC/hpf   WBC, UA >50 (H) 0 - 5 WBC/hpf   Bacteria, UA MANY (A) NONE SEEN   Squamous Epithelial / LPF 0-5 0 - 5   WBC Clumps PRESENT    Mucus PRESENT    Hyaline Casts, UA PRESENT     Comment: Performed at [facility], [place] [address] [place], [place] [address]  Glucose, capillary     Status: Abnormal   Collection Time: 08/27/21  8:10 PM  Result Value Ref Range   Glucose-Capillary 212 (H) 70 - 99 mg/dL    Comment: Glucose reference range applies only to samples taken after fasting for at least 8 hours.   Comment 1 Notify RN    Comment 2 Document in Chart   Renal function panel     Status: Abnormal   Collection Time: 08/27/21 10:35 PM  Result Value Ref Range   Sodium 138 135 - 145 mmol/L    Comment: DELTA CHECK NOTED   Potassium 5.0 3.5 - 5.1 mmol/L    Comment: DELTA CHECK NOTED   Chloride 105 98 - 111 mmol/L   CO2 26 22 - 32 mmol/L   Glucose, Bld 197 (H) 70 - 99 mg/dL    Comment: Glucose reference range applies only to samples taken after fasting for at least 8 hours.   BUN 50 (H) 8 - 23 mg/dL   Creatinine, Ser 2.01 (H) 0.44 - 1.00 mg/dL   Calcium 9.5 8.9 - 10.3 mg/dL   Phosphorus 4.2 2.5 - 4.6 mg/dL   Albumin 3.1 (L) 3.5 - 5.0 g/dL   GFR, Estimated 24 (L) >60 mL/min    Comment: (NOTE) Calculated using the CKD-EPI Creatinine Equation (2021)    Anion gap 7 5 - 15    Comment: Performed at  [facility], [place] [address], [place], [place] [address]  Glucose, capillary     Status: Abnormal   Collection Time: 08/27/21 11:29 PM  Result Value Ref Range   Glucose-Capillary 172 (H) 70 - 99 mg/dL    Comment: Glucose reference range applies only to samples taken after fasting for at least 8 hours.   Comment 1 Notify RN    Comment 2 Document in Chart   Renal function panel (daily at 0500)     Status: Abnormal   Collection Time: 08/28/21  2:56 AM  Result Value Ref Range   Sodium 138 135 - 145 mmol/L   Potassium 4.4 3.5 - 5.1 mmol/L   Chloride 103 98 - 111 mmol/L   CO2 28 22 - 32 mmol/L   Glucose, Bld 181 (H) 70 - 99 mg/dL    Comment: Glucose reference range applies only to samples taken after fasting for at least 8 hours.   BUN 39 (H) 8 - 23 mg/dL   Creatinine, Ser 1.77 (H) 0.44 - 1.00 mg/dL   Calcium 8.9 8.9 - 10.3 mg/dL   Phosphorus 3.6 2.5 -  4.6 mg/dL   Albumin 3.0 (L) 3.5 - 5.0 g/dL   GFR, Estimated 29 (L) >60 mL/min    Comment: (NOTE) Calculated using the CKD-EPI Creatinine Equation (2021)    Anion gap 7 5 - 15    Comment: Performed at [facility], [place] [address], [place], [place] [address]  Magnesium     Status: None   Collection Time: 08/28/21  2:56 AM  Result Value Ref Range   Magnesium 2.3 1.7 - 2.4 mg/dL    Comment: Performed at [facility], [place] [address] [place], [place] [address]  APTT     Status: Abnormal   Collection Time: 08/28/21  2:56 AM  Result Value Ref Range   aPTT 44 (H) 24 - 36 seconds    Comment:        IF BASELINE aPTT IS ELEVATED, SUGGEST PATIENT RISK ASSESSMENT BE USED TO DETERMINE APPROPRIATE ANTICOAGULANT THERAPY. Performed at [facility], [place] [address] [place], [place] [address]   CBC     Status: Abnormal   Collection Time: 08/28/21  2:56 AM  Result Value Ref Range   WBC 6.5 4.0 - 10.5 K/uL   RBC 2.91 (L) 3.87 - 5.11 MIL/uL   Hemoglobin 7.6 (L) 12.0 - 15.0  g/dL   HCT 24.8 (L) 36.0 - 46.0 %   MCV 85.2 80.0 - 100.0 fL   MCH 26.1 26.0 - 34.0 pg   MCHC 30.6 30.0 - 36.0 g/dL   RDW 16.6 (H) 11.5 - 15.5 %   Platelets 228 150 - 400 K/uL   nRBC 0.5 (H) 0.0 - 0.2 %    Comment: Performed at [facility], [place] [address], [place], [place] [id number]  Basic metabolic panel     Status: Abnormal   Collection Time: 08/28/21  2:56 AM  Result Value Ref Range   Sodium 138 135 - 145 mmol/L   Potassium 4.4 3.5 - 5.1 mmol/L   Chloride 103 98 - 111 mmol/L   CO2 27 22 - 32 mmol/L   Glucose, Bld 178 (H) 70 - 99 mg/dL    Comment: Glucose reference range applies only to samples taken after fasting for at least 8 hours.   BUN 39 (H) 8 - 23 mg/dL   Creatinine, Ser 1.76 (H) 0.44 - 1.00 mg/dL   Calcium 8.8 (L) 8.9 - 10.3 mg/dL   GFR, Estimated 29 (L) >60 mL/min    Comment: (NOTE) Calculated using the CKD-EPI Creatinine Equation (2021)    Anion gap 8 5 - 15    Comment: Performed at [facility], [place] [address] [place], [place] [address]  Lactic acid, plasma     Status: None   Collection Time: 08/28/21  2:56 AM  Result Value Ref Range   Lactic Acid, Venous 1.8 0.5 - 1.9 mmol/L    Comment: Performed at [facility], [place] [address], [place], [place] [address]  TSH     Status: None   Collection Time: 08/28/21  2:56 AM  Result Value Ref Range   TSH 0.613 0.350 - 4.500 uIU/mL    Comment: Performed by a 3rd Generation assay with a functional sensitivity of <=0.01 uIU/mL. Performed at [facility], [place] [address] [place], [place] [address]   T4, free     Status: None   Collection Time: 08/28/21  2:56 AM  Result Value Ref Range   Free T4 1.04 0.61 - 1.12 ng/dL  Comment: (NOTE) Biotin ingestion may interfere with free T4 tests. If the results are inconsistent with the TSH level, previous test results, or the clinical presentation, then consider biotin interference. If needed, order  repeat testing after stopping biotin. Performed at [facility], [place] [address], [place], [place] [address]   Glucose, capillary     Status: Abnormal   Collection Time: 08/28/21  3:33 AM  Result Value Ref Range   Glucose-Capillary 178 (H) 70 - 99 mg/dL    Comment: Glucose reference range applies only to samples taken after fasting for at least 8 hours.   Comment 1 Notify RN    Comment 2 Document in Chart   Blood gas, arterial     Status: Abnormal   Collection Time: 08/28/21  4:36 AM  Result Value Ref Range   pH, Arterial 7.4 7.35 - 7.45   pCO2 arterial 47 32 - 48 mmHg   pO2, Arterial 125 (H) 83 - 108 mmHg   Bicarbonate 29.1 (H) 20.0 - 28.0 mmol/L   Acid-Base Excess 3.5 (H) 0.0 - 2.0 mmol/L   O2 Saturation 100 %   Patient temperature 36.8    Allens test (pass/fail) PASS PASS    Comment: Performed at [facility], [place] [address] [place], [place] [address]  Glucose, capillary     Status: Abnormal   Collection Time: 08/28/21  8:01 AM  Result Value Ref Range   Glucose-Capillary 164 (H) 70 - 99 mg/dL    Comment: Glucose reference range applies only to samples taken after fasting for at least 8 hours.  Glucose, capillary     Status: Abnormal   Collection Time: 08/28/21 12:18 PM  Result Value Ref Range   Glucose-Capillary 135 (H) 70 - 99 mg/dL    Comment: Glucose reference range applies only to samples taken after fasting for at least 8 hours.  Renal function panel (daily at 1600)     Status: Abnormal   Collection Time: 08/28/21  2:53 PM  Result Value Ref Range   Sodium 137 135 - 145 mmol/L   Potassium 4.1 3.5 - 5.1 mmol/L   Chloride 101 98 - 111 mmol/L   CO2 28 22 - 32 mmol/L   Glucose, Bld 193 (H) 70 - 99 mg/dL    Comment: Glucose reference range applies only to samples taken after fasting for at least 8 hours.   BUN 25 (H) 8 - 23 mg/dL   Creatinine, Ser 1.51 (H) 0.44 - 1.00 mg/dL   Calcium 8.7 (L) 8.9 - 10.3 mg/dL   Phosphorus 2.5 2.5 - 4.6 mg/dL    Albumin 3.2 (L) 3.5 - 5.0 g/dL   GFR, Estimated 35 (L) >60 mL/min    Comment: (NOTE) Calculated using the CKD-EPI Creatinine Equation (2021)    Anion gap 8 5 - 15    Comment: Performed at [facility], [place] [address], [place], [place] [address]  Glucose, capillary     Status: Abnormal   Collection Time: 08/28/21  3:40 PM  Result Value Ref Range   Glucose-Capillary 202 (H) 70 - 99 mg/dL    Comment: Glucose reference range applies only to samples taken after fasting for at least 8 hours.  Glucose, capillary     Status: Abnormal   Collection Time: 08/28/21  7:45 PM  Result Value Ref Range   Glucose-Capillary 187 (H) 70 - 99 mg/dL    Comment: Glucose reference range applies only to samples taken after fasting for at least 8  hours.  Glucose, capillary     Status: Abnormal   Collection Time: 08/28/21 11:25 PM  Result Value Ref Range   Glucose-Capillary 264 (H) 70 - 99 mg/dL    Comment: Glucose reference range applies only to samples taken after fasting for at least 8 hours.  Magnesium     Status: Abnormal   Collection Time: 08/29/21  3:30 AM  Result Value Ref Range   Magnesium 2.6 (H) 1.7 - 2.4 mg/dL    Comment: Performed at [facility], [place] [address] [place], [place] [address]  APTT     Status: Abnormal   Collection Time: 08/29/21  3:30 AM  Result Value Ref Range   aPTT 125 (H) 24 - 36 seconds    Comment:        IF BASELINE aPTT IS ELEVATED, SUGGEST PATIENT RISK ASSESSMENT BE USED TO DETERMINE APPROPRIATE ANTICOAGULANT THERAPY. Performed at [facility], [place] [address] [place], [place] [address]   CBC     Status: Abnormal   Collection Time: 08/29/21  3:30 AM  Result Value Ref Range   WBC 11.0 (H) 4.0 - 10.5 K/uL   RBC 3.00 (L) 3.87 - 5.11 MIL/uL   Hemoglobin 7.8 (L) 12.0 - 15.0 g/dL   HCT 26.0 (L) 36.0 - 46.0 %   MCV 86.7 80.0 - 100.0 fL   MCH 26.0 26.0 - 34.0 pg   MCHC 30.0 30.0 - 36.0 g/dL   RDW 16.2 (H) 11.5 -  15.5 %   Platelets 238 150 - 400 K/uL   nRBC 0.3 (H) 0.0 - 0.2 %    Comment: Performed at [facility], [place] [address] [place], [place] [address]  Protime-INR     Status: None   Collection Time: 08/29/21  3:30 AM  Result Value Ref Range   Prothrombin Time 13.9 11.4 - 15.2 seconds   INR 1.1 0.8 - 1.2    Comment: (NOTE) INR goal varies based on device and disease states. Performed at [facility], [place] [address] [place], [place] [address]   Comprehensive metabolic panel     Status: Abnormal   Collection Time: 08/29/21  3:30 AM  Result Value Ref Range   Sodium 137 135 - 145 mmol/L   Potassium 4.4 3.5 - 5.1 mmol/L   Chloride 104 98 - 111 mmol/L   CO2 28 22 - 32 mmol/L   Glucose, Bld 145 (H) 70 - 99 mg/dL    Comment: Glucose reference range applies only to samples taken after fasting for at least 8 hours.   BUN 24 (H) 8 - 23 mg/dL   Creatinine, Ser 1.57 (H) 0.44 - 1.00 mg/dL   Calcium 8.0 (L) 8.9 - 10.3 mg/dL   Total Protein 6.5 6.5 - 8.1 g/dL   Albumin 2.8 (L) 3.5 - 5.0 g/dL   AST 27 15 - 41 U/L   ALT 22 0 - 44 U/L   Alkaline Phosphatase 44 38 - 126 U/L   Total Bilirubin 0.3 0.3 - 1.2 mg/dL   GFR, Estimated 33 (L) >60 mL/min    Comment: (NOTE) Calculated using the CKD-EPI Creatinine Equation (2021)    Anion gap 5 5 - 15    Comment: Performed at [facility], [place] [address], [place], [place] [address]  Phosphorus     Status: Abnormal   Collection Time: 08/29/21  3:30 AM  Result Value Ref Range   Phosphorus 1.9 (L) 2.5 - 4.6 mg/dL  Comment: Performed at [facility], [place] [address], [place], [place] [address]  Glucose, capillary     Status: Abnormal   Collection Time: 08/29/21  3:40 AM  Result Value Ref Range   Glucose-Capillary 140 (H) 70 - 99 mg/dL    Comment: Glucose reference range applies only to samples taken after fasting for at least 8 hours.  Troponin I (High Sensitivity)     Status:  Abnormal   Collection Time: 08/29/21  3:54 AM  Result Value Ref Range   Troponin I (High Sensitivity) 37 (H) <18 ng/L    Comment: (NOTE) Elevated high sensitivity troponin I (hsTnI) values and significant  changes across serial measurements may suggest ACS but many other  chronic and acute conditions are known to elevate hsTnI results.  Refer to the "Links" section for chest pain algorithms and additional  guidance. Performed at [facility], [place] [address], [place], [place] [address]   Phosphorus     Status: Abnormal   Collection Time: 08/29/21  6:28 AM  Result Value Ref Range   Phosphorus 1.9 (L) 2.5 - 4.6 mg/dL    Comment: Performed at [facility], [place] [address], [place], [place] [address]  Glucose, capillary     Status: Abnormal   Collection Time: 08/29/21  8:09 AM  Result Value Ref Range   Glucose-Capillary 124 (H) 70 - 99 mg/dL    Comment: Glucose reference range applies only to samples taken after fasting for at least 8 hours.    ECG   N/A  Telemetry   Transcutaneously V-paced at 64 - Personally Reviewed  Radiology    ECHOCARDIOGRAM COMPLETE  Result Date: 08/28/2021    ECHOCARDIOGRAM REPORT   Patient Name:   Virginia Summers Colla Date of Exam: 08/28/2021 Medical Rec #:  [id number]    Height:       60.0 in Accession #:    [id number]   Weight:       190.9 lb Date of Birth:  [birth year]   BSA:          1.830 m Patient Age:    67 years     BP:           157/63 mmHg Patient Gender: F            HR:           56 bpm. Exam Location:  Inpatient Procedure: 2D Echo, Color Doppler, Cardiac Doppler and Intracardiac            Opacification Agent Indications:    CHF-Acute Diastolic [id number]  History:        Patient has prior history of Echocardiogram examinations, most                 recent 11/18/2020. Arrythmias:LBBB and Bradycardia; Risk                 Factors:Hypertension, Diabetes, Dyslipidemia and Sleep Apnea.                 Acute kidney injury- on  CRRT. Externally paced at 55. Chronic                 respiratory failure, home oxygen. Thyroid disease. Pulmonary                 embolism on Eliquis.  Sonographer:    Darlina Sicilian RDCS Referring Phys: [place]  1. Septal hypokinesis consistent with ventricular pacing. Left ventricular ejection fraction,  by estimation, is 50 to 55%. The left ventricle has low normal function. The left ventricle demonstrates regional wall motion abnormalities (see scoring diagram/findings for description). There is mild concentric left ventricular hypertrophy. Left ventricular diastolic parameters are consistent with Grade I diastolic dysfunction (impaired relaxation). Elevated left ventricular end-diastolic pressure.  2. Right ventricular systolic function is normal. The right ventricular size is normal. There is moderately elevated pulmonary artery systolic pressure.  3. Left atrial size was mildly dilated.  4. The mitral valve is normal in structure. Trivial mitral valve regurgitation. No evidence of mitral stenosis.  5. The aortic valve is tricuspid. Aortic valve regurgitation is not visualized. No aortic stenosis is present.  6. The inferior vena cava is dilated in size with >50% respiratory variability, suggesting right atrial pressure of 8 mmHg. FINDINGS  Left Ventricle: Septal hypokinesis consistent with ventricular pacing. Left ventricular ejection fraction, by estimation, is 50 to 55%. The left ventricle has low normal function. The left ventricle demonstrates regional wall motion abnormalities. Definity contrast agent was given IV to delineate the left ventricular endocardial borders. The left ventricular internal cavity size was normal in size. There is mild concentric left ventricular hypertrophy. Left ventricular diastolic parameters are consistent with Grade I diastolic dysfunction (impaired relaxation). Elevated left ventricular end-diastolic pressure. Right Ventricle: The right ventricular  size is normal. No increase in right ventricular wall thickness. Right ventricular systolic function is normal. There is moderately elevated pulmonary artery systolic pressure. The tricuspid regurgitant velocity is 3.35 m/s, and with an assumed right atrial pressure of 8 mmHg, the estimated right ventricular systolic pressure is 52.9 mmHg. Left Atrium: Left atrial size was mildly dilated. Right Atrium: Right atrial size was normal in size. Pericardium: There is no evidence of pericardial effusion. Mitral Valve: The mitral valve is normal in structure. Trivial mitral valve regurgitation. No evidence of mitral valve stenosis. Tricuspid Valve: The tricuspid valve is normal in structure. Tricuspid valve regurgitation is mild . No evidence of tricuspid stenosis. Aortic Valve: The aortic valve is tricuspid. Aortic valve regurgitation is not visualized. No aortic stenosis is present. Pulmonic Valve: The pulmonic valve was normal in structure. Pulmonic valve regurgitation is not visualized. No evidence of pulmonic stenosis. Aorta: The aortic root is normal in size and structure. Venous: The inferior vena cava is dilated in size with greater than 50% respiratory variability, suggesting right atrial pressure of 8 mmHg. IAS/Shunts: No atrial level shunt detected by color flow Doppler.  LEFT VENTRICLE PLAX 2D LVIDd:         4.90 cm      Diastology LVIDs:         3.20 cm      LV e' medial:    5.25 cm/s LV PW:         1.10 cm      LV E/e' medial:  20.2 LV IVS:        1.20 cm      LV e' lateral:   6.53 cm/s LVOT diam:     1.60 cm      LV E/e' lateral: 16.2 LV SV:         67 LV SV Index:   37 LVOT Area:     2.01 cm  LV Volumes (MOD) LV vol d, MOD A2C: 118.0 ml LV vol d, MOD A4C: 136.0 ml LV vol s, MOD A2C: 51.9 ml LV vol s, MOD A4C: 61.8 ml LV SV MOD A2C:     66.1 ml LV SV MOD A4C:  136.0 ml LV SV MOD BP:      71.6 ml RIGHT VENTRICLE RV Basal diam:  2.80 cm RV Mid diam:    1.50 cm RV S prime:     13.00 cm/s TAPSE (M-mode): 1.9  cm LEFT ATRIUM             Index        RIGHT ATRIUM           Index LA diam:        4.00 cm 2.19 cm/m   RA Area:     16.00 cm LA Vol (A2C):   41.0 ml 22.40 ml/m  RA Volume:   36.20 ml  19.78 ml/m LA Vol (A4C):   57.6 ml 31.47 ml/m LA Biplane Vol: 51.5 ml 28.14 ml/m  AORTIC VALVE LVOT Vmax:   155.00 cm/s LVOT Vmean:  103.000 cm/s LVOT VTI:    0.335 m  AORTA Ao Root diam: 2.70 cm Ao Asc diam:  2.50 cm MITRAL VALVE                TRICUSPID VALVE MV Area (PHT): 3.08 cm     TR Peak grad:   44.9 mmHg MV Decel Time: 246 msec     TR Vmax:        335.00 cm/s MV E velocity: 106.00 cm/s MV A velocity: 122.00 cm/s  SHUNTS MV E/A ratio:  0.87         Systemic VTI:  0.34 m                             Systemic Diam: 1.60 cm Skeet Latch MD Electronically signed by Skeet Latch MD Signature Date/Time: 08/28/2021/12:19:11 PM    Final    DG Chest Port 1 View  Result Date: 08/28/2021 CLINICAL DATA:  Respiratory failure. EXAM: PORTABLE CHEST 1 VIEW COMPARISON:  08/27/2021 FINDINGS: Right IJ central venous catheter has tip over the SVC. Lungs are adequately inflated demonstrate mild hazy prominence of the central pulmonary vessels unchanged likely mild vascular congestion. No pneumothorax. Mild stable cardiomegaly. Left base not well evaluated on this exam. Remainder of the exam is unchanged. IMPRESSION: 1. Stable cardiomegaly with suggestion of mild vascular congestion. 2. Right IJ central venous catheter with tip over the SVC. Electronically Signed   By: Marin Olp M.D.   On: 08/28/2021 08:56   US RENAL  Result Date: 08/27/2021 CLINICAL DATA:  [id number].  Acute kidney injury. EXAM: RENAL / URINARY TRACT ULTRASOUND COMPLETE COMPARISON:  Ultrasound renal 11/21/2020 FINDINGS: Limited evaluation due to motion artifact. Right Kidney: Renal measurements: 9.5 x 4.2 x 5.1 cm = volume: 108 mL. Echogenicity within normal limits. No mass or hydronephrosis visualized. Left Kidney: Renal measurements: 9.7 x 5.1 x 5 cm =  volume: 129 mL. Echogenicity within normal limits. No mass or hydronephrosis visualized. Urinary bladder: Not visualized. Other: None. IMPRESSION: 1. Unremarkable renal ultrasound with limited evaluation due to respiratory motion artifact. 2. Urinary bladder not visualized. Electronically Signed   By: Iven Finn M.D.   On: 08/27/2021 18:59   DG Chest 1 View  Result Date: 08/27/2021 CLINICAL DATA:  Central line placement EXAM: CHEST  1 VIEW COMPARISON:  Same day radiograph FINDINGS: Interval placement of right IJ approach central venous catheter with distal tip terminating at the level of the distal SVC. Stable cardiomegaly. Pulmonary vascular congestion with bilateral interstitial prominence, similar to prior. No pneumothorax. IMPRESSION: Interval placement of right IJ approach  central venous catheter with distal tip terminating at the level of the distal SVC. No pneumothorax. Electronically Signed   By: Davina Poke D.O.   On: 08/27/2021 17:21   DG Chest Portable 1 View  Result Date: 08/27/2021 CLINICAL DATA:  Dyspnea EXAM: PORTABLE CHEST 1 VIEW COMPARISON:  11/17/2020 FINDINGS: Transverse diameter of heart is increased. Central pulmonary vessels are prominent. There are no signs of alveolar pulmonary edema. There is slight prominence of interstitial markings in the parahilar regions. There is no focal pulmonary consolidation. Left lateral CP angle is indistinct which may be due to chest wall attenuation or pleural thickening or small effusion. There is no pneumothorax. IMPRESSION: Cardiomegaly. There is prominence of central pulmonary vessels suggesting possible mild CHF. Subtle increase in interstitial markings in the parahilar regions may suggest mild interstitial edema. Electronically Signed   By: Elmer Picker M.D.   On: 08/27/2021 15:00    Cardiac Studies   See echo above  Assessment   Principal Problem:   Bradycardia Active Problems:   Acute respiratory failure (HCC)    Renal failure   Hyperkalemia   Plan   Virginia Summers presented with acidemia, profound hyperkalemia and aKI - improved with CVVHD. Had sinus rhythm yesterday briefly, then apparently HR back in the 30's with complete heart block - pacer was restarted. External pacing is not a long-term solution. I suspect she will need a PPM. D/w Dr. Cristopher Peru - he will evaluate her today for possible pacer.  Asked about resuming diuretics - would defer to nephrology given her recent AKI. Please keep her NPO in case there is a plan to pursue PPM today.  CRITICAL CARE TIME: I have spent a total of 35 minutes with patient reviewing hospital notes, telemetry, EKGs, labs and examining the patient as well as establishing an assessment and plan that was discussed with the patient.  > 50% of time was spent in direct patient care, including management of an external pacemaker for which she is currently dependent on. The patient is critically ill with multi-organ system failure and requires high complexity decision making for assessment and support, frequent evaluation and titration of therapies, application of advanced monitoring technologies and extensive interpretation of multiple databases.   Length of Stay:  LOS: 2 days   Pixie Casino, MD, [facility] [place] Director of the [facility] Lipid Disorders &  Cardiovascular Risk Reduction Clinic Diplomate of the American Board of Clinical Lipidology Attending Cardiologist  Direct Dial: [phone]  Fax: [phone]  Website:  [email or web address] List 08/29/2021, 8:40 AM

## 2021-08-29 NOTE — Progress Notes (Addendum)
Pt HR Dropped down to 30's-40s.  External Pacemaker turned back on MA of 10, Rate 56.  Pt uncomfortable with shocks, Charge RN at bedside and turned MA to 8 - improved.  Elink RN on screen and Notified Elink MD Dr. Valora Piccolo.  New order to decrease dopamine rate to 2.5 due to increased b/p.  Text page sent to Cardio Fellow Dr. Rosita Fire per [facility] RN.  Call returned at [phone] - no further orders.

## 2021-08-29 NOTE — H&P (Signed)
NAME:  Virginia Summers, MRN:  [id number], DOB:  Jul 14, 1940, LOS: 2 ADMISSION DATE:  08/27/2021, CONSULTATION DATE:  08/27/2021  REFERRING MD:  Dr Vivi Martens of ER, CHIEF COMPLAINT:  Hyperkalemia., complete heart block AKI, resp failure   PCP Deborah Chalk, FNP Cardiologist Dr. Lawana Chambers Nahser   BRIEF   81 year old obese female with multiple medical problem,s, sedentary female who is actually taking care of her daughter who is currently bedridden with back pain.  Patient herself has a history of chronic respiratory 3 L nasal cannula at home at baseline not otherwise specified, hypertension, pulmonary embolism on Eliquis, hypothyroidism, diabetes, BMI 39 obesity, history of COVID in August [id number], grade 1 diastolic dysfunction.,  Chronic hyponatremia on chlorthalidone and sodium, severe uncontrolled hypertension on multiple medications including ARB, Lasix, chlorthalidone diuretic admission in September 2022 for acute respiratory failure with hypoxemia in the setting of hypertension urgency and also associated AKI and hyperkalemia and associated Klebsiella pneumonia UTI during this time did have junctional bradycardia with discontinuation of carvedilol.  Dischargedon oxygen and also short steroid course.  Discharge creatinine was normal 0.9 mg percent in September 2022. Discharged on 3L o2  Presented acutely to the emergency department.  History provided by nurse practitioner chart review shows that she was given some meloxicam and triglyceride medications earlier in the week prior to admission.  Also uncontrolled hypertension and blood pressure medication doses increased.  Then called EMS s6/16/23 with worsening shortness of breath for 5 days.  Found to be in class IV dyspnea requiring BiPAP.  Found to be bradycardic and per EDP in complete heart block.  Potassium greater than 7.9.  Creatinine nearly 3 mg percent [tripled in value compared to baseline].  Very similar has been suggested September  2022 but much worse.  Respiratory distress requiring BiPAP.  Patient received atropine x3, calcium, insulin glucose, sodium bicarbonate push subsequently infusion.  CCM called.  At the time of CCM MD evaluation in the bedside in the ICU patient on BiPAP with severe hypertension systolic greater than [id number] tachypneic but not paradoxical.  Dr. Elissa Hefty renal at the bedside.  No formal dx of OSA but snores  Past Medical History:   has a past medical history of Anticoagulant long-term use, Chest pain, Diabetes mellitus, Diverticulitis, DVT (deep venous thrombosis) ([place]), GERD (gastroesophageal reflux disease), HTN (hypertension), Hypothyroidism, and Pulmonary embolism ([place]).   has a past surgical history that includes Back surgery (2000); Partial hysterectomy (AGE 2's APPROX.); Appendectomy (AGE 76); Shoulder surgery (5-6 YRS OLD); and Secondary intraocular lens implantation [id number]).    Significant Hospital Events:  08/27/2021 - admit. [facility] consult, cards, consult., CCM admit. S HD PLACED, Start CRRT., On BiPAP. Externally paced  6/17 - CRRT continues.  Negative balance over 100 cc/h and tolerating it well.  Blood pressure still high.  Not making urine.  Still being externally paced and pacer dependent.  Dr. Debara Pickett cardiology at the bedside and feels patient has intrinsic conduction disease but wants to wait another 24 hours before making a determination.  Respiratory status much improved on BiPAP.  40% FiO2.  - Turned pacer off ad HR dropped to 30s and pacer turned back on    Interim History / Subjective:   08/29/2021 -> OFf CRRT this morning after filter clogged. MAde 100 cc x 24h -> CRRT stoppe -> 125cc in past 2h urine.  Negative balance -3.3L since admit.  - RVP negative.  Blooc culture - BCID: Staph epi. ++.  ECHO  ef 55%, LVH+. Focal hypokinesis + Gr 1 DDx +   Turned o2 off -> pulse ox 95% Room air. REports SNORRING + but no diagnosis of OSA. . SBP170/MAP 104   Objective    Blood pressure (!) 170/46, pulse 82, temperature 99.1 F (37.3 C), temperature source Oral, resp. rate 20, height 5' (1.524 m), weight 86.2 kg, SpO2 99 %.        Intake/Output Summary (Last 24 hours) at 08/29/2021 0751 Last data filed at 08/29/2021 0530 Gross per 24 hour  Intake 2240.89 ml  Output 4571.8 ml  Net -2330.91 ml   Filed Weights   08/27/21 1700 08/28/21 2330 08/29/21 0216  Weight: 86.6 kg 86.2 kg 86.2 kg    Examination: General Appearance:  Looks better. OBESE Head:  Normocephalic, without obvious abnormality, atraumatic Eyes:  PERRL - yes, conjunctiva/corneas - clear     Ears:  Normal external ear canals, both ears Nose:  G tube - NO  Throat:  ETT TUBE - NON , OG tube - No.,  Neck:  Supple,  No enlargement/tenderness/nodules  HAS Rt IJ HD CATH Lungs: Clear to auscultation bilaterally anteriortly Heart:  S1 and S2 normal, no murmur, CVP - NO.  Pressors - NO Abdomen:  Soft, no masses, no organomegaly Genitalia / Rectal:  Not done Extremities:  Extremities- intact Skin:  ntact in exposed areas . Sacral area - NOT EXAMINED Neurologic:  Sedation - NONE -> RASS - +1 . Moves all 4s - yes. CAM-ICU - neg . Orientation - x3+    Resolved Hospital Problem list   X  Assessment & Plan:     History of extensive bilateral pulmonary embolism 05/26/2010 (negative evaluation CT February 2021 and September 2022] -on permanent anticoagulation with currently on proph dose Eliquis -  Prior to & Present on Admit  History of chronic hypoxemic respiratory failure nocturnal oxygen at home - Prior to & Present on Admit   - discharged on home 3L night o2 in Aug 2022 after  covid  - no PFT  - no primary pulmonary   Possibly undiagnosed sleep apnea - Present on Admit   History of acute respiratory failure due to acute kidney injury and hypertensive diastolic heart failure with admission September 2022 - Prior to & Present on Admit  Currently admitted with severe acute hypoxemic  respiratory failure and distress -Present on Admit.   -Due to acute on chronic likely diastolic dysfunction and new systolic dysfunctioin and acute kidney injury     08/29/2021 -> used BiPAP at night. Off o2 this morning  P:   Change to empiric cPAP QHS - > needs OPD sleep eval O2 for pulse ox goal > 90% - if needed Head of bed elevation Check ABG pre discharge OPD sleep eval at [facility] (message sent 08/29/21)     Chronic pain, insomnia, anxiety/depression -Home meds of Cymbalta, Celexa, Desyrel  - prior to & Present on Admit At risk for acute encephalopathy  - 08/29/2021 intact mental status since admit. Cymbalta restarted yesteray at her request  P:   Monitor closely Cymbalta scheduled Avoid benzodiazepines/fentanyl to the extent possible     History of severe uncontrolled hypertension with previous admissions for hypertensive emergency - Prior to & Present on Admit  -On Diovan, hydralazine, hydrocodone, amlodipine at home  Very likely another hypertensive crisis this admission -- Present on Admit   - 08/29/2021: -Despite volume removal on CRRT blood pressure remains high. SBP:  170/MAP 105  P:  Continue to  monitor Re-add amlodipine No ARBs due to renal fialure Systolic blood pressure goal between 140 and 160 (25% reduction)   Known grade 1 diastolic dysfunction on echo September 2022 - Prior to & Present on Admit History of prior admissions for acute on chronic diastolic heart failure - Prior to & Present on Admit  Currently very likely acute on chronic diastolic heart failure (BNP > 1000) - Present on Admit   - 08/29/2021   BNP very high.    Troponin flat. -3L after CRRT. ECHO Ef 55% and slightly lower. OFf CRRT  P: Cardiology following    Previous history of junctional rhythm -September 2022 Currently   - Complete heart block and peaked T waves associated with  life-threatening hyperkalemia - Present on Admit   08/29/2021  - unable to come off  external pacer - still needing it  P: EXternal Pacer Telemetry monitoring Heading to PPM  - timing per cards     History of COVID in August 2022 with persistent positivity in September 2022 History of Klebsiella UTI September 2022 Currently abnormal urine analysis - Present on Admit   -08/27/2021: Respiratory virus panel negative  -08/27/2021: Urine strep negative  -08/27/2021: Abnormal UA with pyuria and hematuria microscopic and leukocytes -raising concern for UTI  - 6/16 - Blood culture 2 of 4 blottles ? Staph epi   P:   Await procalcitonin Await urine culture [place] blood culture Chagne empriic cefeopime to cefazolin   Cefpeime 6/17 - 6/18  Cefazolin 6/18 >>        Prior hx of AKI Currently  = AKi with life threatening hyperkalemia - due to hypertensive urgency and ? Metformin, NSAID meloxicam and dehydration - Present on Admit -Normal renal ultrasound at admission     08/29/2021 -CRRT off after fliter clogged. Making 100+ cc urine in just few hours  P:  Foley to continue - renal to decide dc timing Renal following - based on course - HD v followup No ARB No NSAID Ensure renal dosing of meds   Mild lactic acidosis at admission  08/28/2021: Resolved  P Monitor    Life threatening  hyperkalemia - Present on Admit Low phosphorus 08/29/21  08/29/2021: Potassium normalized -> K still normal  P: Monitor off CRRT Replete Phos Monitor    History of hiatal hernia small  08/29/2021 -no hx of vomit  P:   PPI Oral diet   Anemia of chronic disease - baseline around 9gm% - Present on Admit (no chagne at admit)  08/29/2021: Hemoglobin 7.8.  Likely due to critical illness no bleeding.  P:  - PRBC for hgb </= 6.9gm%    - exceptions are   -  if ACS susepcted/confirmed then transfuse for hgb </= 8.0gm%,  or    -  active bleeding with hemodynamic instability, then transfuse regardless of hemoglobin value   At at all times try to transfuse 1 unit prbc as  possible with exception of active hemorrhage    Normal palteet at admit but at risk for thrombocytopenia On prophylactic dose Eliquis at home   P Monitor with heparin for DVT prophylaxis    T2DM on metformin - Prior to & Present on Admit -on metformin HYperlipidemia - Prior to & Present on Admit -on fenofibrate, Lipitor Hypothyroidism - Prior to & Present on Admit -> on Synthroid (TSH normal this admission]     08/29/2021 at risk for hypo and hyperglycemia  P:   Sliding scale insulin Synthroid  MSK/DERM Morbid obesity Sedentary  lifestyle ?  Compliance issues Physical deconditioning  All above baseline  Plan  - monitoir    Best practice (daily eval):  Diet: npo Pain/Anxiety/Delirium protocol (if indicated): none VAP protocol (if indicated): x DVT prophylaxis: Prophylaxis dose of Eliquis at home ->  Heparin DVT prophylaxis -> start po eliquis low dose when appropriate GI prophylaxis: PPI Glucose control: SSI Mobility: bed rest Code Status: full   Disposition: ICU [place] long - keep in ICU but ask TRH to take over primary 08/30/21 and CCM off (d/w Dr Tyrell Antonio)  Family 08/27/2021  - NP updated dtr Fernande Bras 08/28/21 - MD updated dtr Fernande Bras 08/29/21 MD updated dtr Rodney Booze and patient      [place]   The patient Virginia Summers is critically ill with multiple organ systems failure and requires high complexity decision making for assessment and support, frequent evaluation and titration of therapies, application of advanced monitoring technologies and extensive interpretation of multiple databases.   Critical Care Time devoted to patient care services described in this note is  40  Minutes. This time reflects time of care of this signee Dr Brand Males. This critical care time does not reflect procedure time, or teaching time or supervisory time of PA/NP/Med student/Med Resident etc but could involve care discussion time     Dr. Brand Males, M.D., [facility].C.P Pulmonary and Critical Care Medicine Medical Director - [facility] ICU Staff Physician, [place] [facility] Pager: [phone], If no answer or between  15:00h - 7:00h: call [phone]  08/29/2021 7:51 AM     LABS    PULMONARY Recent Labs  Lab 08/27/21 1505 08/27/21 1745 08/28/21 0436  PHART  --  7.28* 7.4  PCO2ART  --  46 47  PO2ART  --  206* 125*  HCO3  --  21.6 29.1*  TCO2 17*  --   --   O2SAT  --  100 100    CBC Recent Labs  Lab 08/27/21 1425 08/27/21 1505 08/28/21 0256 08/29/21 0330  HGB 8.5* 9.2* 7.6* 7.8*  HCT 28.6* 27.0* 24.8* 26.0*  WBC 8.7  --  6.5 11.0*  PLT 289  --  228 238    COAGULATION Recent Labs  Lab 08/29/21 0330  INR 1.1    CARDIAC  No results for input(s): "TROPONINI" in the last 168 hours. No results for input(s): "PROBNP" in the last 168 hours.  CHEMISTRY Recent Labs  Lab 08/27/21 1425 08/27/21 1505 08/27/21 2235 08/28/21 0256 08/28/21 1453 08/29/21 0330  NA 131* 130* 138 138  138 137 137  K >7.5* 7.9* 5.0 4.4  4.4 4.1 4.4  CL 105 107 105 103  103 101 104  CO2 18*  --  26 27  28 28 28   GLUCOSE 211* 232* 197* 178*  181* 193* 145*  BUN 70* 72* 50* 39*  39* 25* 24*  CREATININE 2.73* 2.90* 2.01* 1.76*  1.77* 1.51* 1.57*  CALCIUM 9.0  --  9.5 8.8*  8.9 8.7* 8.0*  MG  --   --   --  2.3  --  2.6*  PHOS  --   --  4.2 3.6 2.5 1.9*   Estimated Creatinine Clearance: 27.4 mL/min (A) (by C-G formula based on SCr of 1.57 mg/dL (H)).   LIVER Recent Labs  Lab 08/27/21 1425 08/27/21 2235 08/28/21 0256 08/28/21 1453 08/29/21 0330  AST 20  --   --   --  27  ALT 20  --   --   --  22  ALKPHOS 58  --   --   --  44  BILITOT 0.9  --   --   --  0.3  PROT 7.8  --   --   --  6.5  ALBUMIN 3.7 3.1* 3.0* 3.2* 2.8*  INR  --   --   --   --  1.1     INFECTIOUS Recent Labs  Lab 08/27/21 1724 08/28/21 0256  LATICACIDVEN 2.0* 1.8      ENDOCRINE CBG (last 3)  Recent Labs    08/28/21 1945 08/28/21 2325 08/29/21 0340  GLUCAP 187* 264* 140*         IMAGING x48h  - image(s) personally visualized  -   highlighted in bold ECHOCARDIOGRAM COMPLETE  Result Date: 08/28/2021    ECHOCARDIOGRAM REPORT   Patient Name:   ONI DIETZMAN Heydt Date of Exam: 08/28/2021 Medical Rec #:  [id number]    Height:       60.0 in Accession #:    [id number]   Weight:       190.9 lb Date of Birth:  [birth year]   BSA:          1.830 m Patient Age:    80 years     BP:           157/63 mmHg Patient Gender: F            HR:           56 bpm. Exam Location:  Inpatient Procedure: 2D Echo, Color Doppler, Cardiac Doppler and Intracardiac            Opacification Agent Indications:    CHF-Acute Diastolic I50.31  History:        Patient has prior history of Echocardiogram examinations, most                 recent 11/18/2020. Arrythmias:LBBB and Bradycardia; Risk                 Factors:Hypertension, Diabetes, Dyslipidemia and Sleep Apnea.                 Acute kidney injury- on CRRT. Externally paced at 55. Chronic                 respiratory failure, home oxygen. Thyroid disease. Pulmonary                 embolism on Eliquis.  Sonographer:    Leta Jungling RDCS Referring Phys: 23 Danner [place] IMPRESSIONS  1. Septal hypokinesis consistent with ventricular pacing. Left ventricular ejection fraction, by estimation, is 50 to 55%. The left ventricle has low normal function. The left ventricle demonstrates regional wall motion abnormalities (see scoring diagram/findings for description). There is mild concentric left ventricular hypertrophy. Left ventricular diastolic parameters are consistent with Grade I diastolic dysfunction (impaired relaxation). Elevated left ventricular end-diastolic pressure.  2. Right ventricular systolic function is normal. The right ventricular size is normal. There is moderately elevated pulmonary artery systolic pressure.  3. Left atrial size  was mildly dilated.  4. The mitral valve is normal in structure. Trivial mitral valve regurgitation. No evidence of mitral stenosis.  5. The aortic valve is tricuspid. Aortic valve regurgitation is not visualized. No aortic stenosis is present.  6. The inferior vena cava is dilated in size with >50% respiratory variability, suggesting right atrial pressure of 8 mmHg. FINDINGS  Left Ventricle: Septal  hypokinesis consistent with ventricular pacing. Left ventricular ejection fraction, by estimation, is 50 to 55%. The left ventricle has low normal function. The left ventricle demonstrates regional wall motion abnormalities. Definity contrast agent was given IV to delineate the left ventricular endocardial borders. The left ventricular internal cavity size was normal in size. There is mild concentric left ventricular hypertrophy. Left ventricular diastolic parameters are consistent with Grade I diastolic dysfunction (impaired relaxation). Elevated left ventricular end-diastolic pressure. Right Ventricle: The right ventricular size is normal. No increase in right ventricular wall thickness. Right ventricular systolic function is normal. There is moderately elevated pulmonary artery systolic pressure. The tricuspid regurgitant velocity is 3.35 m/s, and with an assumed right atrial pressure of 8 mmHg, the estimated right ventricular systolic pressure is 52.9 mmHg. Left Atrium: Left atrial size was mildly dilated. Right Atrium: Right atrial size was normal in size. Pericardium: There is no evidence of pericardial effusion. Mitral Valve: The mitral valve is normal in structure. Trivial mitral valve regurgitation. No evidence of mitral valve stenosis. Tricuspid Valve: The tricuspid valve is normal in structure. Tricuspid valve regurgitation is mild . No evidence of tricuspid stenosis. Aortic Valve: The aortic valve is tricuspid. Aortic valve regurgitation is not visualized. No aortic stenosis is present. Pulmonic Valve: The  pulmonic valve was normal in structure. Pulmonic valve regurgitation is not visualized. No evidence of pulmonic stenosis. Aorta: The aortic root is normal in size and structure. Venous: The inferior vena cava is dilated in size with greater than 50% respiratory variability, suggesting right atrial pressure of 8 mmHg. IAS/Shunts: No atrial level shunt detected by color flow Doppler.  LEFT VENTRICLE PLAX 2D LVIDd:         4.90 cm      Diastology LVIDs:         3.20 cm      LV e' medial:    5.25 cm/s LV PW:         1.10 cm      LV E/e' medial:  20.2 LV IVS:        1.20 cm      LV e' lateral:   6.53 cm/s LVOT diam:     1.60 cm      LV E/e' lateral: 16.2 LV SV:         67 LV SV Index:   37 LVOT Area:     2.01 cm  LV Volumes (MOD) LV vol d, MOD A2C: 118.0 ml LV vol d, MOD A4C: 136.0 ml LV vol s, MOD A2C: 51.9 ml LV vol s, MOD A4C: 61.8 ml LV SV MOD A2C:     66.1 ml LV SV MOD A4C:     136.0 ml LV SV MOD BP:      71.6 ml RIGHT VENTRICLE RV Basal diam:  2.80 cm RV Mid diam:    1.50 cm RV S prime:     13.00 cm/s TAPSE (M-mode): 1.9 cm LEFT ATRIUM             Index        RIGHT ATRIUM           Index LA diam:        4.00 cm 2.19 cm/m   RA Area:     16.00 cm LA Vol (A2C):   41.0 ml 22.40 ml/m  RA Volume:   36.20 ml  19.78 ml/m LA Vol (A4C):   57.6 ml 31.47 ml/m LA Biplane Vol: 51.5 ml 28.14 ml/m  AORTIC VALVE LVOT  Vmax:   155.00 cm/s LVOT Vmean:  103.000 cm/s LVOT VTI:    0.335 m  AORTA Ao Root diam: 2.70 cm Ao Asc diam:  2.50 cm MITRAL VALVE                TRICUSPID VALVE MV Area (PHT): 3.08 cm     TR Peak grad:   44.9 mmHg MV Decel Time: 246 msec     TR Vmax:        335.00 cm/s MV E velocity: 106.00 cm/s MV A velocity: 122.00 cm/s  SHUNTS MV E/A ratio:  0.87         Systemic VTI:  0.34 m                             Systemic Diam: 1.60 cm Skeet Latch MD Electronically signed by Skeet Latch MD Signature Date/Time: 08/28/2021/12:19:11 PM    Final    DG Chest Port 1 View  Result Date: 08/28/2021 CLINICAL  DATA:  Respiratory failure. EXAM: PORTABLE CHEST 1 VIEW COMPARISON:  08/27/2021 FINDINGS: Right IJ central venous catheter has tip over the SVC. Lungs are adequately inflated demonstrate mild hazy prominence of the central pulmonary vessels unchanged likely mild vascular congestion. No pneumothorax. Mild stable cardiomegaly. Left base not well evaluated on this exam. Remainder of the exam is unchanged. IMPRESSION: 1. Stable cardiomegaly with suggestion of mild vascular congestion. 2. Right IJ central venous catheter with tip over the SVC. Electronically Signed   By: Marin Olp M.D.   On: 08/28/2021 08:56   US RENAL  Result Date: 08/27/2021 CLINICAL DATA:  [id number].  Acute kidney injury. EXAM: RENAL / URINARY TRACT ULTRASOUND COMPLETE COMPARISON:  Ultrasound renal 11/21/2020 FINDINGS: Limited evaluation due to motion artifact. Right Kidney: Renal measurements: 9.5 x 4.2 x 5.1 cm = volume: 108 mL. Echogenicity within normal limits. No mass or hydronephrosis visualized. Left Kidney: Renal measurements: 9.7 x 5.1 x 5 cm = volume: 129 mL. Echogenicity within normal limits. No mass or hydronephrosis visualized. Urinary bladder: Not visualized. Other: None. IMPRESSION: 1. Unremarkable renal ultrasound with limited evaluation due to respiratory motion artifact. 2. Urinary bladder not visualized. Electronically Signed   By: Iven Finn M.D.   On: 08/27/2021 18:59   DG Chest 1 View  Result Date: 08/27/2021 CLINICAL DATA:  Central line placement EXAM: CHEST  1 VIEW COMPARISON:  Same day radiograph FINDINGS: Interval placement of right IJ approach central venous catheter with distal tip terminating at the level of the distal SVC. Stable cardiomegaly. Pulmonary vascular congestion with bilateral interstitial prominence, similar to prior. No pneumothorax. IMPRESSION: Interval placement of right IJ approach central venous catheter with distal tip terminating at the level of the distal SVC. No pneumothorax.  Electronically Signed   By: Davina Poke D.O.   On: 08/27/2021 17:21   DG Chest Portable 1 View  Result Date: 08/27/2021 CLINICAL DATA:  Dyspnea EXAM: PORTABLE CHEST 1 VIEW COMPARISON:  11/17/2020 FINDINGS: Transverse diameter of heart is increased. Central pulmonary vessels are prominent. There are no signs of alveolar pulmonary edema. There is slight prominence of interstitial markings in the parahilar regions. There is no focal pulmonary consolidation. Left lateral CP angle is indistinct which may be due to chest wall attenuation or pleural thickening or small effusion. There is no pneumothorax. IMPRESSION: Cardiomegaly. There is prominence of central pulmonary vessels suggesting possible mild CHF. Subtle increase in interstitial markings in the parahilar regions may suggest mild  interstitial edema. Electronically Signed   By: Elmer Picker M.D.   On: 08/27/2021 15:00

## 2021-08-30 DIAGNOSIS — R579 Shock, unspecified: Secondary | ICD-10-CM | POA: Diagnosis not present

## 2021-08-30 DIAGNOSIS — B957 Other staphylococcus as the cause of diseases classified elsewhere: Secondary | ICD-10-CM

## 2021-08-30 DIAGNOSIS — N1832 Chronic kidney disease, stage 3b: Secondary | ICD-10-CM

## 2021-08-30 DIAGNOSIS — I10 Essential (primary) hypertension: Secondary | ICD-10-CM

## 2021-08-30 DIAGNOSIS — I5032 Chronic diastolic (congestive) heart failure: Secondary | ICD-10-CM

## 2021-08-30 DIAGNOSIS — E039 Hypothyroidism, unspecified: Secondary | ICD-10-CM

## 2021-08-30 DIAGNOSIS — R001 Bradycardia, unspecified: Secondary | ICD-10-CM | POA: Diagnosis not present

## 2021-08-30 DIAGNOSIS — I442 Atrioventricular block, complete: Secondary | ICD-10-CM | POA: Diagnosis not present

## 2021-08-30 DIAGNOSIS — R7881 Bacteremia: Secondary | ICD-10-CM

## 2021-08-30 DIAGNOSIS — N179 Acute kidney failure, unspecified: Secondary | ICD-10-CM

## 2021-08-30 LAB — CBC
HCT: 28.6 % — ABNORMAL LOW (ref 36.0–46.0)
Hemoglobin: 8.4 g/dL — ABNORMAL LOW (ref 12.0–15.0)
MCH: 25.3 pg — ABNORMAL LOW (ref 26.0–34.0)
MCHC: 29.4 g/dL — ABNORMAL LOW (ref 30.0–36.0)
MCV: 86.1 fL (ref 80.0–100.0)
Platelets: 245 10*3/uL (ref 150–400)
RBC: 3.32 MIL/uL — ABNORMAL LOW (ref 3.87–5.11)
RDW: 15.8 % — ABNORMAL HIGH (ref 11.5–15.5)
WBC: 7.4 10*3/uL (ref 4.0–10.5)
nRBC: 0 % (ref 0.0–0.2)

## 2021-08-30 LAB — GLUCOSE, CAPILLARY
Glucose-Capillary: 110 mg/dL — ABNORMAL HIGH (ref 70–99)
Glucose-Capillary: 145 mg/dL — ABNORMAL HIGH (ref 70–99)
Glucose-Capillary: 131 mg/dL — ABNORMAL HIGH (ref 70–99)
Glucose-Capillary: 125 mg/dL — ABNORMAL HIGH (ref 70–99)
Glucose-Capillary: 235 mg/dL — ABNORMAL HIGH (ref 70–99)
Glucose-Capillary: 134 mg/dL — ABNORMAL HIGH (ref 70–99)

## 2021-08-30 LAB — COMPREHENSIVE METABOLIC PANEL
ALT: 15 U/L (ref 0–44)
AST: 17 U/L (ref 15–41)
Albumin: 2.9 g/dL — ABNORMAL LOW (ref 3.5–5.0)
Alkaline Phosphatase: 44 U/L (ref 38–126)
Anion gap: 7 (ref 5–15)
BUN: 38 mg/dL — ABNORMAL HIGH (ref 8–23)
CO2: 29 mmol/L (ref 22–32)
Calcium: 8.5 mg/dL — ABNORMAL LOW (ref 8.9–10.3)
Chloride: 104 mmol/L (ref 98–111)
Creatinine, Ser: 1.79 mg/dL — ABNORMAL HIGH (ref 0.44–1.00)
GFR, Estimated: 28 mL/min — ABNORMAL LOW (ref >60)
Glucose, Bld: 105 mg/dL — ABNORMAL HIGH (ref 70–99)
Potassium: 4.5 mmol/L (ref 3.5–5.1)
Sodium: 140 mmol/L (ref 135–145)
Total Bilirubin: 0.3 mg/dL (ref 0.3–1.2)
Total Protein: 6.4 g/dL — ABNORMAL LOW (ref 6.5–8.1)

## 2021-08-30 LAB — SURGICAL PCR SCREEN
MRSA, PCR: NEGATIVE
Staphylococcus aureus: NEGATIVE

## 2021-08-30 LAB — MAGNESIUM: Magnesium: 2.7 mg/dL — ABNORMAL HIGH (ref 1.7–2.4)

## 2021-08-30 LAB — URINE CULTURE: Culture: >=100000 — AB

## 2021-08-30 LAB — LEGIONELLA PNEUMOPHILA SEROGP 1 UR AG: L. pneumophila Serogp 1 Ur Ag: NEGATIVE

## 2021-08-30 LAB — PROCALCITONIN: Procalcitonin: 0.18 ng/mL

## 2021-08-30 LAB — APTT: aPTT: 33 seconds (ref 24–36)

## 2021-08-30 MED ORDER — CEFAZOLIN SODIUM-DEXTROSE 2-4 GM/100ML-% IV SOLN: 2.0000 g | INTRAVENOUS | Status: DC

## 2021-08-30 MED ORDER — CHLORHEXIDINE GLUCONATE 4 % EX LIQD
60.0000 mL | Freq: Once | CUTANEOUS | Status: AC
  Administered 2021-08-30: 4 via TOPICAL
  Filled 2021-08-30 (×2): qty 60

## 2021-08-30 MED ORDER — SODIUM CHLORIDE 0.9% FLUSH: 10.0000 mL | INTRAVENOUS | Status: DC | PRN

## 2021-08-30 MED ORDER — SODIUM CHLORIDE 0.9 % IV SOLN
80.0000 mg | INTRAVENOUS | Status: AC
  Filled 2021-08-30: qty 2

## 2021-08-30 MED ORDER — CHLORHEXIDINE GLUCONATE 4 % EX LIQD
60.0000 mL | Freq: Once | CUTANEOUS | Status: AC
  Administered 2021-08-30: 4 via TOPICAL
  Filled 2021-08-30: qty 60

## 2021-08-30 MED ORDER — SODIUM CHLORIDE 0.9% FLUSH
10.0000 mL | Freq: Two times a day (BID) | INTRAVENOUS | Status: DC
  Administered 2021-08-30 – 2021-09-01 (×3): 10 mL

## 2021-08-30 MED ORDER — ORAL CARE MOUTH RINSE: 15.0000 mL | OROMUCOSAL | Status: DC | PRN

## 2021-08-30 MED ORDER — SODIUM CHLORIDE 0.9 % IV SOLN: INTRAVENOUS | Status: DC

## 2021-08-30 MED ORDER — SODIUM CHLORIDE 0.9 % IV SOLN
8.0000 mg/kg | Freq: Every day | INTRAVENOUS | Status: DC
  Administered 2021-08-30: 500 mg via INTRAVENOUS
  Filled 2021-08-30 (×2): qty 10

## 2021-08-30 NOTE — Progress Notes (Addendum)
Progress Note  Patient Name: Virginia Summers Date of Encounter: 08/30/2021  [facility] Cardiologist: Kristeen Miss, MD   Subjective   Patient reports that she is feeling well this AM. Denies being in any pain. Denies SOB, cough, chills, body aches, nausea/vomiting. Does have occasional palpitations.   Patient is not currently being paced and telemetry shows she is maintaining a HR in the 50s-60s. Did have multiple episodes on pacing overnight and was uncomfortable with shocks.   Inpatient Medications    Scheduled Meds:  amLODipine  10 mg Oral Daily   chlorhexidine  60 mL Topical Once   Chlorhexidine Gluconate Cloth  6 each Topical Daily   DULoxetine  60 mg Oral QHS   gentamicin (GARAMYCIN) 80 mg in sodium chloride 0.9 % 500 mL irrigation  80 mg Irrigation On Call   heparin injection (subcutaneous)  5,000 Units Subcutaneous Q8H   insulin aspart  0-6 Units Subcutaneous Q4H   levothyroxine  25 mcg Oral QAC breakfast   mouth rinse  15 mL Mouth Rinse 4 times per day   pantoprazole (PROTONIX) IV  40 mg Intravenous QHS   Continuous Infusions:   prismasol BGK 4/2.5 500 mL/hr at 08/29/21 0300    prismasol BGK 4/2.5 500 mL/hr at 08/29/21 0300   sodium chloride 50 mL/hr at 08/30/21 0611    ceFAZolin (ANCEF) IV Stopped (08/29/21 2224)    ceFAZolin (ANCEF) IV     DOPamine 2.5 mcg/kg/min (08/30/21 0611)   heparin 10,000 units/ 20 mL infusion syringe 500 Units/hr (08/29/21 0500)   prismasol BGK 4/2.5 1,500 mL/hr at 08/29/21 0142   PRN Meds: alteplase, docusate sodium, heparin, magic mouthwash w/lidocaine, mouth rinse, polyethylene glycol, sodium chloride   Vital Signs    Vitals:   08/30/21 0500 08/30/21 0530 08/30/21 0600 08/30/21 0700  BP: (!) 152/45 (!) 159/58 (!) 147/34 (!) 143/40  Pulse: 72 74 (!) 51 (!) 50  Resp: (!) 24 14 17  (!) 29  Temp:      TempSrc:      SpO2: 97% 100% 99% 98%  Weight:      Height:        Intake/Output Summary (Last 24 hours) at 08/30/2021  0757 Last data filed at 08/30/2021 0611 Gross per 24 hour  Intake 840.96 ml  Output 1825 ml  Net -984.04 ml      08/30/2021    4:00 AM 08/29/2021    2:16 AM 08/28/2021   11:30 PM  Last 3 Weights  Weight (lbs) 189 lb 2.5 oz 190 lb 0.6 oz 190 lb 0.6 oz  Weight (kg) 85.8 kg 86.2 kg 86.2 kg      Telemetry    Episodes of prolonged pacing overnight, currently not paced. Maintaining sinus rhythm, HR in the 50s - Personally Reviewed  ECG    No new tracings - Personally Reviewed  Physical Exam   GEN: No acute distress.  Sitting comfortably in the bed wearing nasal cannula at 4 L oxygen  Neck: No JVD Cardiac: RRR, grade 2/6 systolic murmur at RUSB. Radial pulses 2+ bilaterally    Respiratory: Fine crackles in bilateral lung bases, otherwise clear to ausculation bilaterally  GI: Soft, nontender, non-distended  MS: No edema; No deformity. Neuro:  Nonfocal  Psych: Normal affect   Labs    High Sensitivity Troponin:   Recent Labs  Lab 08/27/21 1425 08/27/21 1716 08/29/21 0354  TROPONINIHS 14 24* 37*     Chemistry Recent Labs  Lab 08/27/21 1425 08/27/21 1505 08/28/21 0256  08/28/21 1453 08/29/21 0330 08/29/21 1839 08/30/21 0350  NA 131*   < > 138  138   < > 137 138 140  K >7.5*   < > 4.4  4.4   < > 4.4 4.5 4.5  CL 105   < > 103  103   < > 104 102 104  CO2 18*   < > 27  28   < > 28 29 29   GLUCOSE 211*   < > 178*  181*   < > 145* 124* 105*  BUN 70*   < > 39*  39*   < > 24* 33* 38*  CREATININE 2.73*   < > 1.76*  1.77*   < > 1.57* 1.94* 1.79*  CALCIUM 9.0   < > 8.8*  8.9   < > 8.0* 8.5* 8.5*  MG  --   --  2.3  --  2.6*  --  2.7*  PROT 7.8  --   --   --  6.5  --  6.4*  ALBUMIN 3.7   < > 3.0*   < > 2.8* 3.0* 2.9*  AST 20  --   --   --  27  --  17  ALT 20  --   --   --  22  --  15  ALKPHOS 58  --   --   --  44  --  44  BILITOT 0.9  --   --   --  0.3  --  0.3  GFRNONAA 17*   < > 29*  29*   < > 33* 26* 28*  ANIONGAP 8   < > 8  7   < > 5 7 7    < > = values in this  interval not displayed.    Lipids No results for input(s): "CHOL", "TRIG", "HDL", "LABVLDL", "LDLCALC", "CHOLHDL" in the last 168 hours.  Hematology Recent Labs  Lab 08/28/21 0256 08/29/21 0330 08/30/21 0350  WBC 6.5 11.0* 7.4  RBC 2.91* 3.00* 3.32*  HGB 7.6* 7.8* 8.4*  HCT 24.8* 26.0* 28.6*  MCV 85.2 86.7 86.1  MCH 26.1 26.0 25.3*  MCHC 30.6 30.0 29.4*  RDW 16.6* 16.2* 15.8*  PLT 228 238 245   Thyroid  Recent Labs  Lab 08/28/21 0256  TSH 0.613  FREET4 1.04    BNP Recent Labs  Lab 08/27/21 1425  BNP 1,166.7*    DDimer  Recent Labs  Lab 08/27/21 1427  DDIMER 1.10*     Radiology    ECHOCARDIOGRAM COMPLETE  Result Date: 08/28/2021    ECHOCARDIOGRAM REPORT   Patient Name:   Virginia Summers Date of Exam: 08/28/2021 Medical Rec #:  [id number]    Height:       60.0 in Accession #:    [id number]   Weight:       190.9 lb Date of Birth:  13-Oct-1940   BSA:          1.830 m Patient Age:    81 years     BP:           157/63 mmHg Patient Gender: F            HR:           56 bpm. Exam Location:  Inpatient Procedure: 2D Echo, Color Doppler, Cardiac Doppler and Intracardiac            Opacification Agent Indications:    CHF-Acute Diastolic [id number]  History:  Patient has prior history of Echocardiogram examinations, most                 recent 11/18/2020. Arrythmias:LBBB and Bradycardia; Risk                 Factors:Hypertension, Diabetes, Dyslipidemia and Sleep Apnea.                 Acute kidney injury- on CRRT. Externally paced at 55. Chronic                 respiratory failure, home oxygen. Thyroid disease. Pulmonary                 embolism on Eliquis.  Sonographer:    Darlina Sicilian RDCS Referring Phys: [place]  1. Septal hypokinesis consistent with ventricular pacing. Left ventricular ejection fraction, by estimation, is 50 to 55%. The left ventricle has low normal function. The left ventricle demonstrates regional wall motion abnormalities (see scoring  diagram/findings for description). There is mild concentric left ventricular hypertrophy. Left ventricular diastolic parameters are consistent with Grade I diastolic dysfunction (impaired relaxation). Elevated left ventricular end-diastolic pressure.  2. Right ventricular systolic function is normal. The right ventricular size is normal. There is moderately elevated pulmonary artery systolic pressure.  3. Left atrial size was mildly dilated.  4. The mitral valve is normal in structure. Trivial mitral valve regurgitation. No evidence of mitral stenosis.  5. The aortic valve is tricuspid. Aortic valve regurgitation is not visualized. No aortic stenosis is present.  6. The inferior vena cava is dilated in size with >50% respiratory variability, suggesting right atrial pressure of 8 mmHg. FINDINGS  Left Ventricle: Septal hypokinesis consistent with ventricular pacing. Left ventricular ejection fraction, by estimation, is 50 to 55%. The left ventricle has low normal function. The left ventricle demonstrates regional wall motion abnormalities. Definity contrast agent was given IV to delineate the left ventricular endocardial borders. The left ventricular internal cavity size was normal in size. There is mild concentric left ventricular hypertrophy. Left ventricular diastolic parameters are consistent with Grade I diastolic dysfunction (impaired relaxation). Elevated left ventricular end-diastolic pressure. Right Ventricle: The right ventricular size is normal. No increase in right ventricular wall thickness. Right ventricular systolic function is normal. There is moderately elevated pulmonary artery systolic pressure. The tricuspid regurgitant velocity is 3.35 m/s, and with an assumed right atrial pressure of 8 mmHg, the estimated right ventricular systolic pressure is [id number] mmHg. Left Atrium: Left atrial size was mildly dilated. Right Atrium: Right atrial size was normal in size. Pericardium: There is no evidence of  pericardial effusion. Mitral Valve: The mitral valve is normal in structure. Trivial mitral valve regurgitation. No evidence of mitral valve stenosis. Tricuspid Valve: The tricuspid valve is normal in structure. Tricuspid valve regurgitation is mild . No evidence of tricuspid stenosis. Aortic Valve: The aortic valve is tricuspid. Aortic valve regurgitation is not visualized. No aortic stenosis is present. Pulmonic Valve: The pulmonic valve was normal in structure. Pulmonic valve regurgitation is not visualized. No evidence of pulmonic stenosis. Aorta: The aortic root is normal in size and structure. Venous: The inferior vena cava is dilated in size with greater than 50% respiratory variability, suggesting right atrial pressure of 8 mmHg. IAS/Shunts: No atrial level shunt detected by color flow Doppler.  LEFT VENTRICLE PLAX 2D LVIDd:         4.90 cm      Diastology LVIDs:         3.20  cm      LV e' medial:    5.25 cm/s LV PW:         1.10 cm      LV E/e' medial:  20.2 LV IVS:        1.20 cm      LV e' lateral:   6.53 cm/s LVOT diam:     1.60 cm      LV E/e' lateral: 16.2 LV SV:         67 LV SV Index:   37 LVOT Area:     2.01 cm  LV Volumes (MOD) LV vol d, MOD A2C: 118.0 ml LV vol d, MOD A4C: 136.0 ml LV vol s, MOD A2C: 51.9 ml LV vol s, MOD A4C: 61.8 ml LV SV MOD A2C:     66.1 ml LV SV MOD A4C:     136.0 ml LV SV MOD BP:      71.6 ml RIGHT VENTRICLE RV Basal diam:  2.80 cm RV Mid diam:    1.50 cm RV S prime:     13.00 cm/s TAPSE (M-mode): 1.9 cm LEFT ATRIUM             Index        RIGHT ATRIUM           Index LA diam:        4.00 cm 2.19 cm/m   RA Area:     16.00 cm LA Vol (A2C):   41.0 ml 22.40 ml/m  RA Volume:   36.20 ml  19.78 ml/m LA Vol (A4C):   57.6 ml 31.47 ml/m LA Biplane Vol: 51.5 ml 28.14 ml/m  AORTIC VALVE LVOT Vmax:   155.00 cm/s LVOT Vmean:  103.000 cm/s LVOT VTI:    0.335 m  AORTA Ao Root diam: 2.70 cm Ao Asc diam:  2.50 cm MITRAL VALVE                TRICUSPID VALVE MV Area (PHT): 3.08 cm      TR Peak grad:   44.9 mmHg MV Decel Time: 246 msec     TR Vmax:        335.00 cm/s MV E velocity: 106.00 cm/s MV A velocity: 122.00 cm/s  SHUNTS MV E/A ratio:  0.87         Systemic VTI:  0.34 m                             Systemic Diam: 1.60 cm Skeet Latch MD Electronically signed by Skeet Latch MD Signature Date/Time: 08/28/2021/12:19:11 PM    Final    DG Chest Port 1 View  Result Date: 08/28/2021 CLINICAL DATA:  Respiratory failure. EXAM: PORTABLE CHEST 1 VIEW COMPARISON:  08/27/2021 FINDINGS: Right IJ central venous catheter has tip over the SVC. Lungs are adequately inflated demonstrate mild hazy prominence of the central pulmonary vessels unchanged likely mild vascular congestion. No pneumothorax. Mild stable cardiomegaly. Left base not well evaluated on this exam. Remainder of the exam is unchanged. IMPRESSION: 1. Stable cardiomegaly with suggestion of mild vascular congestion. 2. Right IJ central venous catheter with tip over the SVC. Electronically Signed   By: Marin Olp M.D.   On: 08/28/2021 08:56    Cardiac Studies   Echocardiogram 08/28/21   1. Septal hypokinesis consistent with ventricular pacing. Left  ventricular ejection fraction, by estimation, is 50 to 55%. The left  ventricle has low normal function.  The left ventricle demonstrates  regional wall motion abnormalities (see scoring  diagram/findings for description). There is mild concentric left  ventricular hypertrophy. Left ventricular diastolic parameters are  consistent with Grade I diastolic dysfunction (impaired relaxation).  Elevated left ventricular end-diastolic pressure.   2. Right ventricular systolic function is normal. The right ventricular  size is normal. There is moderately elevated pulmonary artery systolic  pressure.   3. Left atrial size was mildly dilated.   4. The mitral valve is normal in structure. Trivial mitral valve  regurgitation. No evidence of mitral stenosis.   5. The aortic valve is  tricuspid. Aortic valve regurgitation is not  visualized. No aortic stenosis is present.   6. The inferior vena cava is dilated in size with >50% respiratory  variability, suggesting right atrial pressure of 8 mmHg.   Patient Profile     81 y.o. female with a history of chronic repisratory failure (3L via nasal cannula at baseline), HTN, pulmonary embolism on Eliquis, hypothyroidism, diabetes,  Chronic hyponatremia on chlorthalidone and sodium, severe uncontrolled hypertension. Now admitted with severe acute hypoxemic respiratory failure and distress, developed episodic bradycardia and required external pacing, found to have complete heart block.   Assessment & Plan    Complete Heart Block  Bradycardia  - Patient seen by Dr. Ladona Ridgel with EP yesterday-- after patient's renal function and hyperkalemia have improved, she has continued to have CHB with periods of prolonged transcutaneous pacing. Need to ensure that there is no evidence of infection prior to PPM insertion.  - Blood Cultures from 6/16 grew staphylococcus epidermidis, staphylococcus capitis. Likely contaminant, although both bottles grew same organisms. Repeat cultures from 6/17 show no grown <24 hours.  - Urine culture from 6/16 showed Klebsiella Pneumoniae  - Currently on Abx per primary  - Continue dopamine for now  - Planned to be transferred to [facility] for PPM today-- has been NPO   History of extensive bilateral pulmonary embolism (05/2010) - Negative evaluation CT 04/2019, 11/2020 - Was on prophylactic dose xarelto, has not been receiving this admission   Chronic Diastolic Heart failure  - Given recent AKI and treatment with CRRT-- defer diuretic management to nephrology     HTN  - Continue amlodipine 10 mg daily  - Home chlorthalidone, valsartan held due to renal function   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 Temp HD cath in place  -   Renal funtion improving  - Avoid nephrotoxic medications   Hyperkalemia  - Presented with K >7.5, now improved to 4.5       For questions or updates, please contact [facility] Please consult [email or web address] for contact info under        Signed, Jonita Albee, PA-C  08/30/2021, 7:57 AM    Personally seen and examined. Agree with above.  Had lengthy conversation with patient, her daughters, infectious disease team, Dr. Algis Liming.  It is likely that the first set of blood cultures were secondary to skin contaminants.  The subsequent blood culture off of antibiotic has not grown any bacteria to date.  This is all reassuring.  My suspicion is that if she had underlying endocarditis or other significant blood-borne infection the subsequent blood culture would have quickly been positive.  She is afebrile as well.  Based upon this discussion, we think it is reasonable to proceed with pacemaker implantation.  Francis Dowse, Dr. Lalla Brothers, Dr. Ladona Ridgel are aware of patient.  Dr. Lovena Le saw in consultation yesterday.  She is currently on low-dose dopamine for some chronotropic support.  CRITICAL CARE Performed by: Candee Furbish   Total critical care time: 40 minutes  Critical care time was exclusive of separately billable procedures and treating other patients.  Critical care was necessary to treat or prevent imminent or life-threatening deterioration.  Critical care was time spent personally by me on the following activities: development of treatment plan with patient and/or surrogate as well as nursing, discussions with consultants, evaluation of patient's response to treatment, examination of patient, obtaining history from patient or surrogate, ordering and performing treatments and interventions, ordering and review of laboratory studies, ordering and review of radiographic studies, pulse oximetry and re-evaluation of patient's condition.  Candee Furbish, MD

## 2021-08-30 NOTE — Telephone Encounter (Signed)
Patient is scheduled for hospital followup/Sleep apnea eval on 09/29/2021 at 12pm with Jeanmarie Plant, NP. Consult papers/ reminder mailed- nothing further needed

## 2021-08-30 NOTE — Consult Note (Signed)
Date of Admission:  08/27/2021          Reason for Consult: + blood cultures for several coagulase negative staph species    Referring Provider: Junius Finner, MD   Assessment:  + blood cultures from 2 different sites (though on the same side) with Staphylococcus epidermidis + staphylococcus capitis but with repeat blood cultures negative WITHOUT intervening antibiotics suspicious for former being contaminants Asymptomatic bacteruria Complete heart block Diastolic heart failure Obesity Hx of PE Hx of COVID 19 in August of 2022  Plan:  Continue daptomycin for now Discontinue cefepime I have asked microbiology to work-up the sensitivity data from both cultures on both species though even if they show concordant organisms I still suspect that these 2 blood cultures from the same arm are likely contaminants I am supportive of pacemaker implantation since I regard the initial cultures is likely contaminants  Principal Problem:   Bradycardia Active Problems:   Acute respiratory failure (HCC)   Renal failure   Hyperkalemia   Heart block AV complete (HCC)   Bacteremia   Scheduled Meds:  chlorhexidine  60 mL Topical Once   Chlorhexidine Gluconate Cloth  6 each Topical Daily   DULoxetine  60 mg Oral QHS   gentamicin (GARAMYCIN) 80 mg in sodium chloride 0.9 % 500 mL irrigation  80 mg Irrigation On Call   heparin injection (subcutaneous)  5,000 Units Subcutaneous Q8H   insulin aspart  0-6 Units Subcutaneous Q4H   levothyroxine  25 mcg Oral QAC breakfast   mouth rinse  15 mL Mouth Rinse 4 times per day   pantoprazole (PROTONIX) IV  40 mg Intravenous QHS   sodium chloride flush  10-40 mL Intracatheter Q12H   Continuous Infusions:  sodium chloride 50 mL/hr at 08/30/21 1600    ceFAZolin (ANCEF) IV Stopped (08/30/21 1107)    ceFAZolin (ANCEF) IV     DAPTOmycin (CUBICIN) 500 mg in sodium chloride 0.9 % IVPB Stopped (08/30/21 1256)   DOPamine Stopped (08/30/21 1553)   PRN  Meds:.docusate sodium, magic mouthwash w/lidocaine, mouth rinse, polyethylene glycol, sodium chloride flush  HPI: Virginia Summers is a 81 y.o. female with multiple medical problems including difficult to control hypertension morbid obesity chronic kidney disease prior COVID-19 infection who presented to the hospital with confusion and potential syncopal events now admitted with profound bradycardia and intermittent complete heart block acute kidney injury requiring renal replacement temporarily who had blood cultures obtained on admission which grew Staphylococcus epidermidis and Staphylococcus capitis from 2 sites namely " right forearm and right hand ."  She also grew Klebsiella pneumonia from urine at greater than 100 Thousand colony-forming units.  She was initially not on antibiotics and had repeat blood cultures taken which so far at 48 hours are not growing any organisms whatsoever from 2 sites taken in the left hand and the right hand.  She was started on cefepime on August 28, 2021 and had been continued on through today.  We added daptomycin when we first heard of her positive cultures.  I do suspect that her initial blood cultures taken on the 16th represent contaminants.  While they are listed as having been drawn from 2 different sites it is notable that they were both taken from the right forearm namely right hand and right arm and I am concerned that these areas may not have been adequately sterilized and the same organisms may contaminated the blood cultures if she had a true bacteremia that was ongoing her  blood cultures should have remain persistently positive when she had them rechecked the following day without antibiotics having been started.  The concern by critical care and cardiology is whether these positive blood cultures could represent true bacteremia and with her need for placement of a permanent pacemaker whether or not this would be safe at this point in time.  I think that  again the initial blood cultures were likely contaminated cultures both having been taken from the right forearm and hand.  I am encouraged that this is the case given the fact that she has had negative blood cultures from June 17 without intervening active antibiotics.  I have asked microbiology to work-up the susceptibilities of the blood cultures taken of the right forearm and right hand on both organisms to see if there Concorde and or discordant clear if there discordantly further argument in favor of these being contamination.  Her transthoracic echocardiogram was reviewed and shows no evidence of endocarditis  However even if they are concordant I would still favor the idea of them being contaminants.  In the interim though to cover the risk of it being a genuine bacteremia we will continue with daptomycin.  The patient has been transferred to [facility] for placement of permanent pacemaker.  I will follow-up on her culture and sensitivity data and available for questions but the patient needs to be formally seen at [facility] the inpatient team at [facility] should be consulted.  I spent 90 minutes with the patient including than 50% of the time in face to face counseling of the patient and her daughters regarding the nature of blood culture collecting bacteremias need for pacemaker placement the concerns about actual bacteremia and risk of pacemaker infection, personally reviewing chest x-rays updated cultures reviewing  with review of medical records in preparation for the visit and during the visit and in coordination of her care with CCM and Dr. Anne Fu.     Review of Systems: Review of Systems  Constitutional:  Negative for chills, fever, malaise/fatigue and weight loss.  HENT:  Negative for congestion and sore throat.   Eyes:  Negative for blurred vision and photophobia.  Respiratory:  Negative for cough, shortness of breath and wheezing.   Cardiovascular:  Negative for chest  pain, palpitations and leg swelling.  Gastrointestinal:  Negative for abdominal pain, blood in stool, constipation, diarrhea, heartburn, melena, nausea and vomiting.  Genitourinary:  Negative for dysuria, flank pain and hematuria.  Musculoskeletal:  Negative for back pain, falls, joint pain and myalgias.  Skin:  Negative for itching and rash.  Neurological:  Positive for focal weakness, loss of consciousness and weakness. Negative for dizziness and headaches.  Endo/Heme/Allergies:  Does not bruise/bleed easily.  Psychiatric/Behavioral:  Negative for depression and suicidal ideas. The patient does not have insomnia.     Past Medical History:  Diagnosis Date   Anticoagulant long-term use    Chest pain    Diabetes mellitus    Diverticulitis    DVT (deep venous thrombosis) (HCC)    BILATERAL, March 2012   GERD (gastroesophageal reflux disease)    HTN (hypertension)    Hypothyroidism    Pulmonary embolism (HCC)     Social History   Tobacco Use   Smoking status: Former    Packs/day: 0.50    Years: 30.00    Total pack years: 15.00    Types: Cigarettes    Quit date: 03/15/2003    Years since quitting: 18.4   Smokeless tobacco: Never  Substance Use Topics   Alcohol use: No   Drug use: No    Family History  Problem Relation Age of Onset   Transient ischemic attack Father    Coronary artery disease Father    Stroke Mother    Diabetes Mother    Diabetes Sister    Hypertension Daughter    Allergies  Allergen Reactions   Penicillins Swelling and Rash    Did it involve swelling of the face/tongue/throat, SOB, or low BP? No Did it involve sudden or severe rash/hives, skin peeling, or any reaction on the inside of your mouth or nose? No Did you need to seek medical attention at a hospital or doctor's office? No When did it last happen?    childhood   If all above answers are "NO", may proceed with cephalosporin use.   Sulfa Drugs Cross Reactors Swelling and Rash     OBJECTIVE: Blood pressure (!) 127/93, pulse (!) 53, temperature 98.3 F (36.8 C), resp. rate 17, height 5' (1.524 m), weight 85.8 kg, SpO2 98 %.  Physical Exam Constitutional:      General: She is not in acute distress.    Appearance: Normal appearance. She is well-developed. She is not ill-appearing or diaphoretic.  HENT:     Head: Normocephalic and atraumatic.     Right Ear: Hearing and external ear normal.     Left Ear: Hearing and external ear normal.     Nose: No nasal deformity or rhinorrhea.  Eyes:     General: No scleral icterus.    Conjunctiva/sclera: Conjunctivae normal.     Right eye: Right conjunctiva is not injected.     Left eye: Left conjunctiva is not injected.     Pupils: Pupils are equal, round, and reactive to light.  Neck:     Vascular: No JVD.  Cardiovascular:     Rate and Rhythm: Regular rhythm. Bradycardia present.     Heart sounds: S1 normal and S2 normal.  Pulmonary:     Effort: Pulmonary effort is normal. No respiratory distress.     Breath sounds: No wheezing.  Abdominal:     General: There is no distension.     Palpations: Abdomen is soft.     Tenderness: There is abdominal tenderness.  Musculoskeletal:        General: Normal range of motion.     Right shoulder: Normal.     Left shoulder: Normal.     Cervical back: Normal range of motion and neck supple.     Right hip: Normal.     Left hip: Normal.     Right knee: Normal.     Left knee: Normal.  Lymphadenopathy:     Head:     Right side of head: No submandibular, preauricular or posterior auricular adenopathy.     Left side of head: No submandibular, preauricular or posterior auricular adenopathy.     Cervical: No cervical adenopathy.     Right cervical: No superficial or deep cervical adenopathy.    Left cervical: No superficial or deep cervical adenopathy.  Skin:    General: Skin is warm and dry.     Coloration: Skin is pale.     Findings: No abrasion, bruising, ecchymosis,  erythema, lesion or rash.     Nails: There is no clubbing.  Neurological:     General: No focal deficit present.     Mental Status: She is alert and oriented to person, place, and time.     Sensory: No  sensory deficit.  Psychiatric:        Attention and Perception: She is attentive.        Mood and Affect: Mood normal.        Speech: Speech normal.        Behavior: Behavior normal. Behavior is cooperative.        Thought Content: Thought content normal.        Judgment: Judgment normal.     Lab Results Lab Results  Component Value Date   WBC 7.4 08/30/2021   HGB 8.4 (L) 08/30/2021   HCT 28.6 (L) 08/30/2021   MCV 86.1 08/30/2021   PLT 245 08/30/2021    Lab Results  Component Value Date   CREATININE 1.79 (H) 08/30/2021   BUN 38 (H) 08/30/2021   NA 140 08/30/2021   K 4.5 08/30/2021   CL 104 08/30/2021   CO2 29 08/30/2021    Lab Results  Component Value Date   ALT 15 08/30/2021   AST 17 08/30/2021   ALKPHOS 44 08/30/2021   BILITOT 0.3 08/30/2021     Microbiology: Recent Results (from the past 240 hour(s))  Culture, blood (Routine X 2) w Reflex to ID Panel     Status: Abnormal (Preliminary result)   Collection Time: 08/27/21  5:16 PM   Specimen: BLOOD  Result Value Ref Range Status   Specimen Description   Final    BLOOD BLOOD RIGHT FOREARM Performed at [facility], [place] [address], [place], [place] [address]    Special Requests   Final    IN PEDIATRIC BOTTLE Blood Culture adequate volume Performed at [facility] [address], [place], [place] [address]    Culture  Setup Time   Final    GRAM POSITIVE COCCI IN CLUSTERS IN PEDIATRIC BOTTLE CRITICAL RESULT CALLED TO, READ BACK BY AND VERIFIED WITH: PHARMD Kittie Plater [id number] AT 1435 BY CM    Culture (A)  Final    STAPHYLOCOCCUS EPIDERMIDIS STAPHYLOCOCCUS CAPITIS SUSCEPTIBILITIES TO FOLLOW Performed at [facility], [place] [address], [place], [place] [address]     Report Status PENDING  Incomplete  Culture, blood (Routine X 2) w Reflex to ID Panel     Status: Abnormal (Preliminary result)   Collection Time: 08/27/21  5:16 PM   Specimen: BLOOD  Result Value Ref Range Status   Specimen Description   Final    BLOOD BLOOD RIGHT HAND Performed at [facility] [address] [place], [place] [address]    Special Requests   Final    IN PEDIATRIC BOTTLE Blood Culture adequate volume Performed at [facility] [address] [place], [place] [address]    Culture  Setup Time   Final    GRAM POSITIVE COCCI IN CLUSTERS IN PEDIATRIC BOTTLE CRITICAL VALUE NOTED.  VALUE IS CONSISTENT WITH PREVIOUSLY REPORTED AND CALLED VALUE. Performed at [facility], [place] [address] [place], [place] [address]    Culture (A)  Final    STAPHYLOCOCCUS EPIDERMIDIS STAPHYLOCOCCUS CAPITIS    Report Status PENDING  Incomplete  Blood Culture ID Panel (Reflexed)     Status: Abnormal   Collection Time: 08/27/21  5:16 PM  Result Value Ref Range Status   Enterococcus faecalis NOT DETECTED NOT DETECTED Final   Enterococcus Faecium NOT DETECTED NOT DETECTED Final   Listeria monocytogenes NOT DETECTED NOT DETECTED Final   Staphylococcus species DETECTED (A) NOT DETECTED Final    Comment: CRITICAL RESULT  CALLED TO, READ BACK BY AND VERIFIED WITH: PHARMD Lana Fish [id number] AT 1435 BY CM    Staphylococcus aureus (BCID) NOT DETECTED NOT DETECTED Final   Staphylococcus epidermidis DETECTED (A) NOT DETECTED Final    Comment: CRITICAL RESULT CALLED TO, READ BACK BY AND VERIFIED WITH: PHARMD Lana Fish [id number] AT 1435 BY CM    Staphylococcus lugdunensis NOT DETECTED NOT DETECTED Final   Streptococcus species NOT DETECTED NOT DETECTED Final   Streptococcus agalactiae NOT DETECTED NOT DETECTED Final   Streptococcus pneumoniae NOT DETECTED NOT DETECTED Final   Streptococcus pyogenes NOT DETECTED NOT DETECTED Final   A.calcoaceticus-baumannii NOT  DETECTED NOT DETECTED Final   Bacteroides fragilis NOT DETECTED NOT DETECTED Final   Enterobacterales NOT DETECTED NOT DETECTED Final   Enterobacter cloacae complex NOT DETECTED NOT DETECTED Final   Escherichia coli NOT DETECTED NOT DETECTED Final   Klebsiella aerogenes NOT DETECTED NOT DETECTED Final   Klebsiella oxytoca NOT DETECTED NOT DETECTED Final   Klebsiella pneumoniae NOT DETECTED NOT DETECTED Final   Proteus species NOT DETECTED NOT DETECTED Final   Salmonella species NOT DETECTED NOT DETECTED Final   Serratia marcescens NOT DETECTED NOT DETECTED Final   Haemophilus influenzae NOT DETECTED NOT DETECTED Final   Neisseria meningitidis NOT DETECTED NOT DETECTED Final   Pseudomonas aeruginosa NOT DETECTED NOT DETECTED Final   Stenotrophomonas maltophilia NOT DETECTED NOT DETECTED Final   Candida albicans NOT DETECTED NOT DETECTED Final   Candida auris NOT DETECTED NOT DETECTED Final   Candida glabrata NOT DETECTED NOT DETECTED Final   Candida krusei NOT DETECTED NOT DETECTED Final   Candida parapsilosis NOT DETECTED NOT DETECTED Final   Candida tropicalis NOT DETECTED NOT DETECTED Final   Cryptococcus neoformans/gattii NOT DETECTED NOT DETECTED Final   Methicillin resistance mecA/C NOT DETECTED NOT DETECTED Final    Comment: Performed at [facility], [address], [place], [place] [address]  Respiratory (~20 pathogens) panel by PCR     Status: None   Collection Time: 08/27/21  5:18 PM   Specimen: Nasopharyngeal Swab; Respiratory  Result Value Ref Range Status   Adenovirus NOT DETECTED NOT DETECTED Final   Coronavirus 229E NOT DETECTED NOT DETECTED Final    Comment: (NOTE) The Coronavirus on the Respiratory Panel, DOES NOT test for the novel  Coronavirus (2019 nCoV)    Coronavirus HKU1 NOT DETECTED NOT DETECTED Final   Coronavirus NL63 NOT DETECTED NOT DETECTED Final   Coronavirus OC43 NOT DETECTED NOT DETECTED Final   Metapneumovirus NOT DETECTED NOT DETECTED Final    Rhinovirus / Enterovirus NOT DETECTED NOT DETECTED Final   Influenza A NOT DETECTED NOT DETECTED Final   Influenza B NOT DETECTED NOT DETECTED Final   Parainfluenza Virus 1 NOT DETECTED NOT DETECTED Final   Parainfluenza Virus 2 NOT DETECTED NOT DETECTED Final   Parainfluenza Virus 3 NOT DETECTED NOT DETECTED Final   Parainfluenza Virus 4 NOT DETECTED NOT DETECTED Final   Respiratory Syncytial Virus NOT DETECTED NOT DETECTED Final   Bordetella pertussis NOT DETECTED NOT DETECTED Final   Bordetella Parapertussis NOT DETECTED NOT DETECTED Final   Chlamydophila pneumoniae NOT DETECTED NOT DETECTED Final   Mycoplasma pneumoniae NOT DETECTED NOT DETECTED Final    Comment: Performed at [facility], [address] [place], [place] [address]  Urine Culture     Status: Abnormal   Collection Time: 08/27/21  6:25 PM   Specimen: Urine, Catheterized  Result Value Ref Range Status   Specimen Description  Final    URINE, CATHETERIZED Performed at [facility], [place] [address] [place], [place] [address]    Special Requests   Final    NONE Performed at [facility], [place] [address], [place], [place] [address]    Culture (A)  Final    >=100,000 COLONIES/mL KLEBSIELLA PNEUMONIAE Two isolates with different morphologies were identified as the same organism.The most resistant organism was reported. Performed at [facility], [place] [address], [place], [place] [address]    Report Status 08/30/2021 FINAL  Final   Organism ID, Bacteria KLEBSIELLA PNEUMONIAE (A)  Final      Susceptibility   Klebsiella pneumoniae - MIC*    AMPICILLIN RESISTANT Resistant     CEFAZOLIN <=4 SENSITIVE Sensitive     CEFEPIME <=0.12 SENSITIVE Sensitive     CEFTRIAXONE <=0.25 SENSITIVE Sensitive     CIPROFLOXACIN <=0.25 SENSITIVE Sensitive     GENTAMICIN <=1 SENSITIVE Sensitive     IMIPENEM <=0.25 SENSITIVE Sensitive     NITROFURANTOIN 128 RESISTANT Resistant      TRIMETH/SULFA <=20 SENSITIVE Sensitive     AMPICILLIN/SULBACTAM 4 SENSITIVE Sensitive     PIP/TAZO <=4 SENSITIVE Sensitive     * >=100,000 COLONIES/mL KLEBSIELLA PNEUMONIAE  Culture, blood (Routine X 2) w Reflex to ID Panel     Status: None (Preliminary result)   Collection Time: 08/28/21  8:34 AM   Specimen: BLOOD  Result Value Ref Range Status   Specimen Description   Final    BLOOD BLOOD LEFT HAND Performed at [facility] [address] [place], [place] [address]    Special Requests   Final    BOTTLES DRAWN AEROBIC ONLY Blood Culture adequate volume Performed at [facility] [address], [place], [place] [address]    Culture   Final    NO GROWTH 2 DAYS Performed at [facility] [address], [place], [place] [address]    Report Status PENDING  Incomplete  Culture, blood (Routine X 2) w Reflex to ID Panel     Status: None (Preliminary result)   Collection Time: 08/28/21  8:34 AM   Specimen: BLOOD  Result Value Ref Range Status   Specimen Description   Final    BLOOD BLOOD RIGHT HAND Performed at [facility] [address] [place], [place] [address]    Special Requests   Final    BOTTLES DRAWN AEROBIC ONLY Blood Culture adequate volume Performed at [facility] [address] [place], [place] [address]    Culture   Final    NO GROWTH 2 DAYS Performed at [facility] [address], [place], [place] [address]    Report Status PENDING  Incomplete  MRSA Next Gen by PCR, Nasal     Status: None   Collection Time: 08/29/21  9:55 PM   Specimen: Nasal Mucosa; Nasal Swab  Result Value Ref Range Status   MRSA by PCR Next Gen NOT DETECTED NOT DETECTED Final    Comment: (NOTE) The GeneXpert MRSA Assay (FDA approved for NASAL specimens only), is one component of a comprehensive MRSA colonization surveillance program. It is not intended to diagnose MRSA infection nor to guide or  monitor treatment for MRSA infections. Test performance is not FDA approved in patients less than 5 years old. Performed at [facility], [place] [address] [place], [place] [address]   Surgical PCR screen  Status: None   Collection Time: 08/30/21  4:11 AM   Specimen: Nasal Mucosa; Nasal Swab  Result Value Ref Range Status   MRSA, PCR NEGATIVE NEGATIVE Final   Staphylococcus aureus NEGATIVE NEGATIVE Final    Comment: (NOTE) The Xpert SA Assay (FDA approved for NASAL specimens in patients 66 years of age and older), is one component of a comprehensive surveillance program. It is not intended to diagnose infection nor to guide or monitor treatment. Performed at [facility], [place] [address] [place], [place] [address]     Alcide Evener, [facility] [phone] pager  08/30/2021, 6:04 PM

## 2021-08-30 NOTE — Progress Notes (Signed)
Pt stated she wants to hold off on CPAP for the night and just try wearing 02 instead. RN aware.

## 2021-08-30 NOTE — Progress Notes (Addendum)
Progress Note  Patient Name: Virginia Summers Date of Encounter: 08/30/2021  [facility] Cardiologist: Kristeen Miss, MD   Subjective   Feels OK currently  Inpatient Medications    Scheduled Meds:  chlorhexidine  60 mL Topical Once   Chlorhexidine Gluconate Cloth  6 each Topical Daily   DULoxetine  60 mg Oral QHS   gentamicin (GARAMYCIN) 80 mg in sodium chloride 0.9 % 500 mL irrigation  80 mg Irrigation On Call   heparin injection (subcutaneous)  5,000 Units Subcutaneous Q8H   insulin aspart  0-6 Units Subcutaneous Q4H   levothyroxine  25 mcg Oral QAC breakfast   mouth rinse  15 mL Mouth Rinse 4 times per day   pantoprazole (PROTONIX) IV  40 mg Intravenous QHS   sodium chloride flush  10-40 mL Intracatheter Q12H   Continuous Infusions:  sodium chloride 50 mL/hr at 08/30/21 1300    ceFAZolin (ANCEF) IV Stopped (08/30/21 1107)    ceFAZolin (ANCEF) IV     DAPTOmycin (CUBICIN) 500 mg in sodium chloride 0.9 % IVPB Stopped (08/30/21 1256)   DOPamine 2.5 mcg/kg/min (08/30/21 1300)   PRN Meds: docusate sodium, magic mouthwash w/lidocaine, mouth rinse, polyethylene glycol, sodium chloride flush   Vital Signs    Vitals:   08/30/21 1100 08/30/21 1200 08/30/21 1222 08/30/21 1330  BP: 120/81 (!) 159/38  (!) 165/33  Pulse: (!) 45 (!) 47  (!) 47  Resp: 19 15  17   Temp:   97.7 F (36.5 C)   TempSrc:   Oral   SpO2: 96% 100%  99%  Weight:      Height:        Intake/Output Summary (Last 24 hours) at 08/30/2021 1451 Last data filed at 08/30/2021 1300 Gross per 24 hour  Intake 827.86 ml  Output 1950 ml  Net -1122.14 ml      08/30/2021    4:00 AM 08/29/2021    2:16 AM 08/28/2021   11:30 PM  Last 3 Weights  Weight (lbs) 189 lb 2.5 oz 190 lb 0.6 oz 190 lb 0.6 oz  Weight (kg) 85.8 kg 86.2 kg 86.2 kg      Telemetry    SB 50's (unable to see [facility] telemetry, stored strips are all noted with transcutaneous pacing artifact with non-capture and underlying HR 30's-40's  - Personally  Reviewed  ECG    No new EKGs - Personally Reviewed  Physical Exam   GEN: No acute distress.   Neck: No JVD, R IJ triple lumen Cardiac: RRR, no murmurs, rubs, or gallops.  Respiratory: CTA b/l GI: Soft, nontender, non-distended  MS: No edema; No deformity. Neuro:  Nonfocal  Psych: Normal affect   Labs    High Sensitivity Troponin:   Recent Labs  Lab 08/27/21 1425 08/27/21 1716 08/29/21 0354  TROPONINIHS 14 24* 37*     Chemistry Recent Labs  Lab 08/27/21 1425 08/27/21 1505 08/28/21 0256 08/28/21 1453 08/29/21 0330 08/29/21 1839 08/30/21 0350  NA 131*   < > 138  138   < > 137 138 140  K >7.5*   < > 4.4  4.4   < > 4.4 4.5 4.5  CL 105   < > 103  103   < > 104 102 104  CO2 18*   < > 27  28   < > 28 29 29   GLUCOSE 211*   < > 178*  181*   < > 145* 124* 105*  BUN 70*   < >  39*  39*   < > 24* 33* 38*  CREATININE 2.73*   < > 1.76*  1.77*   < > 1.57* 1.94* 1.79*  CALCIUM 9.0   < > 8.8*  8.9   < > 8.0* 8.5* 8.5*  MG  --   --  2.3  --  2.6*  --  2.7*  PROT 7.8  --   --   --  6.5  --  6.4*  ALBUMIN 3.7   < > 3.0*   < > 2.8* 3.0* 2.9*  AST 20  --   --   --  27  --  17  ALT 20  --   --   --  22  --  15  ALKPHOS 58  --   --   --  44  --  44  BILITOT 0.9  --   --   --  0.3  --  0.3  GFRNONAA 17*   < > 29*  29*   < > 33* 26* 28*  ANIONGAP 8   < > 8  7   < > 5 7 7    < > = values in this interval not displayed.    Lipids No results for input(s): "CHOL", "TRIG", "HDL", "LABVLDL", "LDLCALC", "CHOLHDL" in the last 168 hours.  Hematology Recent Labs  Lab 08/28/21 0256 08/29/21 0330 08/30/21 0350  WBC 6.5 11.0* 7.4  RBC 2.91* 3.00* 3.32*  HGB 7.6* 7.8* 8.4*  HCT 24.8* 26.0* 28.6*  MCV 85.2 86.7 86.1  MCH 26.1 26.0 25.3*  MCHC 30.6 30.0 29.4*  RDW 16.6* 16.2* 15.8*  PLT 228 238 245   Thyroid  Recent Labs  Lab 08/28/21 0256  TSH 0.613  FREET4 1.04    BNP Recent Labs  Lab 08/27/21 1425  BNP 1,166.7*    DDimer  Recent Labs  Lab 08/27/21 1427   DDIMER 1.10*     Radiology    No results found.  Cardiac Studies   Echocardiogram 08/28/21   1. Septal hypokinesis consistent with ventricular pacing. Left  ventricular ejection fraction, by estimation, is 50 to 55%. The left  ventricle has low normal function. The left ventricle demonstrates  regional wall motion abnormalities (see scoring  diagram/findings for description). There is mild concentric left  ventricular hypertrophy. Left ventricular diastolic parameters are  consistent with Grade I diastolic dysfunction (impaired relaxation).  Elevated left ventricular end-diastolic pressure.   2. Right ventricular systolic function is normal. The right ventricular  size is normal. There is moderately elevated pulmonary artery systolic  pressure.   3. Left atrial size was mildly dilated.   4. The mitral valve is normal in structure. Trivial mitral valve  regurgitation. No evidence of mitral stenosis.   5. The aortic valve is tricuspid. Aortic valve regurgitation is not  visualized. No aortic stenosis is present.   6. The inferior vena cava is dilated in size with >50% respiratory  variability, suggesting right atrial pressure of 8 mmHg.   Patient Profile     81 y.o. female w/PMHx of HTN, hypothyroidism, DM, DVT/PE, , COPD, obesity, hypoventilation sx (on home O2) presented with 5 days of SOB and near syncope, she was found in CHB treated with atropine > transcutaneous pacing, labs noted AKI and severe hyperkalemia (>7.5), placed on BIPAP and catheter placed for emergent dialysis.  EP brought on board yesterday with continued episodes of CHB despite electrolyte correction and renal function improvement, discussed that she had [facility] 08/27/21 that noted staph  epi but suspect to be contaminant and planned to transfer for PPM  Assessment & Plan    Intermittent CHB Continued despite  improvement in her labs She was again transcutaneously paced last night for HRs reported 30's-40's And  started on low dose dopamine  Initially thought to need temp wire placement this AM though in d/w general cardiology, she was not unstable and the question of her blood cultures again came up and was relayed that the initial recommendations with ID were that they would treat as if the initial cultures were real for bacteremia,and plans for PPM were cancelled. Again via a chat message conversation, after further discussion between ID and gen cardiologist, contaminant is back on the table as a possibility.  08/27/21 BC +staph epidermis, staph capitus (x2) 08/28/21, BC (neg to date) (x2)  1st dose of cefepime given on 6/17 at 10:17 AFTER the [facility] were drawn, I don't see any antibiotics prior to that Formal ID consult pending  She has since transferred to [facility]  She remains with R IJ triple luman cath  Dr. Lalla Brothers has seen the patient and discussed concerns of her initial BC that have been discussed in the background with some initial uncertainty of contaminant, and would prefer to wait out her 2nd set today and for the official ID consult that is pending.  Discussed with RN to get her off the dopamine and follow  NPO after MN for possible pacing tomorrow pending ID final/formal recommendations and BC.    For questions or updates, please contact [facility] Please consult [email or web address] for contact info under        Signed, Sheilah Pigeon, PA-C  08/30/2021, 2:51 PM

## 2021-08-30 NOTE — Progress Notes (Signed)
Patient en route to [facility] via [facility]; vital signs stable, patient on 3L [place]. Sarah on [facility] notified.

## 2021-08-30 NOTE — Progress Notes (Addendum)
NAME:  Virginia Summers, MRN:  [id number], DOB:  May 22, 1940, LOS: 3 ADMISSION DATE:  08/27/2021, CONSULTATION DATE:  08/27/2021  REFERRING MD:  Dr Bruce Donath of ER, CHIEF COMPLAINT:  Hyperkalemia., complete heart block AKI, resp failure   PCP Eather Colas, FNP Cardiologist Dr. Cheral Bay Nahser   BRIEF   81 year old female who is bedbound at baseline secondary to back pain. Admitted 6/16 with AKI, hyperkalemia, and CHB. CRRT x 2 days. Continued to have CHB requiring intermittent transcutaneous pacing. Transferred to [facility] 6/19 for cardiology evaluation.   Past Medical History:   has a past medical history of Anticoagulant long-term use, Chest pain, Diabetes mellitus, Diverticulitis, DVT (deep venous thrombosis) (HCC), GERD (gastroesophageal reflux disease), HTN (hypertension), Hypothyroidism, and Pulmonary embolism (HCC).   has a past surgical history that includes Back surgery (2000); Partial hysterectomy (AGE 21's APPROX.); Appendectomy (AGE 43); Shoulder surgery (5-6 YRS OLD); and Secondary intraocular lens implantation ([id number]).    Significant Hospital Events:  08/27/2021 - admit. [facility] ICU. Renal consult, cards, consult., CCM admit. S HD PLACED, Start CRRT., On BiPAP. Externally paced  6/17 - CRRT continues.  Negative balance over 100 cc/h and tolerating it well.  Blood pressure still high.  Not making urine.  Still being externally paced and pacer dependent.  Dr. Rennis Golden cardiology at the bedside and feels patient has intrinsic conduction disease but wants to wait another 24 hours before making a determination.  Respiratory status much improved on BiPAP.  40% FiO2.  - Turned pacer off ad HR dropped to 30s and pacer turned back on  6/18 off CRRT. Blood cx positive for staph epi and staph captitis. Back on TCP overnight.     Interim History / Subjective:  On pacer this morning. Spontaneous rate above that of the set pacer so pacer turned off. Intrinsic rate 53.  Maintaining good BP. Seems to drop to 30s 40s when she sleeps. Otherwise she feels tired, but no other complaints.   Objective   Blood pressure (!) 143/40, pulse (!) 50, temperature 98.8 F (37.1 C), temperature source Oral, resp. rate (!) 29, height 5' (1.524 m), weight 85.8 kg, SpO2 98 %.        Intake/Output Summary (Last 24 hours) at 08/30/2021 0807 Last data filed at 08/30/2021 9518 Gross per 24 hour  Intake 600.96 ml  Output 1675 ml  Net -1074.04 ml    Filed Weights   08/28/21 2330 08/29/21 0216 08/30/21 0400  Weight: 86.2 kg 86.2 kg 85.8 kg    Examination: General:  elderly appearing female in NAD Neuro:  Alert, oriented, non-focal HEENT:  [place]/AT, PERRL, no JVD Cardiovascular:  Huston Foley, regular. No MRG. Some PVCs on tele.  Lungs:  Clear bilateral breath sounds Abdomen:  Soft, NT, ND Musculoskeletal:  No acute deformity or edema.  Skin:  Intact MMM   Resolved Hospital Problem list     Assessment & Plan:   Bradycardia: probably complete heart block. Improved somewhat with CRRT and correction of potassium, but still requiring transcutaneous pacing intermittently. Cardiology following and planning for PPM at some point.  - Dopamine infusion continue for HR 50 - Transfer to [facility] for pacemaker.  - Zoll at bedside for TC pacing PRN - Appreciate cardiology/EP assistance.   Bacteremia: blood cultures from two separate sites both growing staph epi and staph capitis. Uncertain if this is a contaminant.  Both blood draws were in pediatric bottles.  UTI: > 100,000 Colonies klebsiella resistant to ampicillin  and macrobid.  - Continue cefazolin - repeat blood cultures pending - have discussed with ID, who cant rule out true bacteremia as opposed to contaminant.   AKI: likely in the setting of hypertensive crisis, metformin, meloxicam + dehydration. Complicated by life threatening hyperkalemia - s/p CRRT 6/16 > 6/18 - following renal indices and electrolytes.  - Holding  potentially offending medications  Acute on chronic hypoxemic respiratory failure> improved with volume removal. On 3 L nocturnal oxygen at home.  Possibly undiagnosed OSA - continue supplemental O2 to keep SpO2 > 90% - empiric QHS CPAP - Follow up in pulmonary clinic when appropriate  Hypertensive crisis improved with history of severe uncontrolled hypertension with previous admissions for hypertensive emergency  - On Diovan, hydralazine, hydrocodone, amlodipine at home - restarted home amlodipine - Normotensive goal - hesitant to stop dopamine at this time. Want to limit her exposure to the transcutaneous pacer.   Known grade 1 diastolic dysfunction on echo September 2022  Currently very likely acute on chronic diastolic heart failure (BNP > 4627) -  BNP very high.   Troponin flat. -3L after CRRT. ECHO Ef 55% and slightly lower. OFf CRRT. Improved with CRRT  P: Cardiology following  History of extensive bilateral pulmonary embolism 05/26/2010 (negative evaluation CT February 2021 and September 2022] -on permanent anticoagulation with currently on proph dose Eliquis  - holding full AC pending pacemaker placement - VTE ppx dosing heparin for now  T2DM on metformin - Prior to & Present on Admit -on metformin HYperlipidemia - Prior to & Present on Admit -on fenofibrate, Lipitor Hypothyroidism - Prior to & Present on Admit -> on Synthroid (TSH normal this admission] - Sliding scale insulin - Synthroid  Chronic pain, insomnia, anxiety/depression -Home meds of Cymbalta, Celexa, Desyrel  - Monitor closely - Cymbalta scheduled - Avoid benzodiazepines/fentanyl to the extent possible   Best practice (daily eval):  Diet: npo Pain/Anxiety/Delirium protocol (if indicated): none VAP protocol (if indicated): x DVT prophylaxis: Prophylaxis dose of Eliquis at home ->  Heparin DVT prophylaxis -> start po eliquis low dose when appropriate GI prophylaxis: PPI Glucose control: SSI Mobility:  bed rest Code Status: full   Disposition: T[facility] to [facility]  Family Patient updated bedside and relayed information to family over the phone.     ATTESTATION & SIGNATURE   Critical care time 48 minutes.   Joneen Roach, AGACNP-BC [facility]  See Amion for personal pager PCCM on call pager [phone] until 7pm. Please call Elink 7p-7a. [phone]  08/30/2021 8:48 AM

## 2021-08-30 NOTE — Plan of Care (Signed)
  Problem: Clinical Measurements: Goal: Will remain free from infection Outcome: Progressing Goal: Diagnostic test results will improve Outcome: Progressing Goal: Respiratory complications will improve Outcome: Progressing Goal: Cardiovascular complication will be avoided Outcome: Progressing   Problem: Nutrition: Goal: Adequate nutrition will be maintained Outcome: Progressing   Problem: Pain Managment: Goal: General experience of comfort will improve Outcome: Progressing   

## 2021-08-30 NOTE — Progress Notes (Signed)
[facility] Progress Note  Subjective:  CRRT was stopped on 6/18 early am per charting; no uf charted over 6/18.  Had 4.5 liters UF over 6/17. She had 1.8 liters UF over 6/18 charted.  She states that she is [facility].  She is on dopamine.   Review of systems:  Denies n/v/d. Has been NPO for procedure She denies shortness of breath  Denies chest pain   Vitals:   08/30/21 1030 08/30/21 1100 08/30/21 1200 08/30/21 1222  BP: (!) 115/95 120/81 (!) 159/38   Pulse: (!) 36 (!) 45 (!) 47   Resp: (!) _0 Temp:    97.7 F (36.5 C)  TempSrc:    Oral  SpO2: 99% 96% 100%   Weight:      Height:        Exam:  General elderly female in bed in no acute distress HEENT normocephalic atraumatic extraocular movements intact sclera anicteric Neck supple trachea midline Lungs clear to auscultation bilaterally normal work of breathing at rest  Heart bradycardia no rub Abdomen soft nontender nondistended Extremities no edema  Psych normal mood and affect Neuro - alert and oriented x 3 provides hx and follows commands; slightly hard of hearing Access: RIJ nontunneled HD catheter  Gen alert, no distress, nasal O2 No jvd or bruits Chest clear bilat to bases RRR no MRG Abd soft ntnd no mass or ascites +bs Ext 1+ pretib edema Neuro is alert, Ox 3 , nf     Home meds include - amlodipine, lipitor, symbicort, celexa, hygroton, cymbalta, tricor, hydralazine 75 tid, synthroid, metformin, Kdur 10 qd, xarelto, trazodone, valsartan 320 qd        Date              Creat               eGFR   2018              0.95- 1.12   2021              1.08- 1.52        32- 57 ml/min     Feb 2022       1.19   Sept 2022      2.84 >> 0.96    16- 60 ml/min   08/27/2021      2.73, 2.90     Assessment/ Plan: AKI on CKD 3a - b/l creatinine is 0.9- 1.5, eGFR 32- 60 from 2021/ 2022. Creat here is 2.7 in setting of recent addition of Cox II inhibitor and recent escalation  of BP meds (possibly losartan) in OP setting. Avoid nsaids/ COX 2 inhibitors and acei/ ARB for now. CRRT started on 6/16. AKI was mild and CRRT was done primarily for resolution of severe hyperkalemia in setting of heart block. CRRT dc'd on 6/18.  Cr improved slightly in the interim off of CRRT Monitor off of CRRT/HD. Discontinued CRRT orders Please keep temp HD cath in place for now but anticipate removal on 6/20 if labs stable after procedure  Severe hyperkalemia - K+ > 7.5. Improved after CRRT and other measures.  Bradycardia - hyperkalemia is now resolved with heart block.  For pacemaker at [facility] today per pt and RN. On dopamine HTN - avoid ARB/ acei's  Hx of DVT/ PE - per primary team  Ckd stage 3a - note as above her baseline Cr is 0.9-1.5 Anemia normocytic - no acute indication  for PRBC's. Improved slightly  Disposition per primary team - per pt and RN she is moving to [facility] for pacer  Recent Labs  Lab 08/29/21 0330 08/29/21 0628 08/29/21 1839 08/30/21 0350  HGB 7.8*  --   --  8.4*  ALBUMIN 2.8*  --  3.0* 2.9*  CALCIUM 8.0*  --  8.5* 8.5*  PHOS 1.9* 1.9* 4.6  --   CREATININE 1.57*  --  1.94* 1.79*  K 4.4  --  4.5 4.5   Inpatient medications:  chlorhexidine  60 mL Topical Once   Chlorhexidine Gluconate Cloth  6 each Topical Daily   DULoxetine  60 mg Oral QHS   gentamicin (GARAMYCIN) 80 mg in sodium chloride 0.9 % 500 mL irrigation  80 mg Irrigation On Call   heparin injection (subcutaneous)  5,000 Units Subcutaneous Q8H   insulin aspart  0-6 Units Subcutaneous Q4H   levothyroxine  25 mcg Oral QAC breakfast   mouth rinse  15 mL Mouth Rinse 4 times per day   pantoprazole (PROTONIX) IV  40 mg Intravenous QHS   sodium chloride flush  10-40 mL Intracatheter Q12H     prismasol BGK 4/2.5 500 mL/hr at 08/29/21 0300    prismasol BGK 4/2.5 500 mL/hr at 08/29/21 0300   sodium chloride 50 mL/hr at 08/30/21 1120    ceFAZolin (ANCEF) IV Stopped (08/30/21 1107)    ceFAZolin (ANCEF) IV      DAPTOmycin (CUBICIN) 500 mg in sodium chloride 0.9 % IVPB 500 mg (08/30/21 1225)   DOPamine 2.5 mcg/kg/min (08/30/21 1120)   heparin 10,000 units/ 20 mL infusion syringe 500 Units/hr (08/29/21 0500)   prismasol BGK 4/2.5 1,500 mL/hr at 08/29/21 0142   alteplase, docusate sodium, heparin, magic mouthwash w/lidocaine, mouth rinse, polyethylene glycol, sodium chloride, sodium chloride flush   Claudia Desanctis, MD 08/30/2021 12:54 PM

## 2021-08-30 NOTE — Progress Notes (Signed)
Report given to [facility]. Patient's belongings packed up and given to patient's daughters.

## 2021-08-31 ENCOUNTER — Encounter (HOSPITAL_COMMUNITY)
Admission: EM | Disposition: A | Discharge status: Home Health | Payer: Self-pay | Source: Home / Self Care | Attending: Internal Medicine

## 2021-08-31 ENCOUNTER — Inpatient Hospital Stay (HOSPITAL_COMMUNITY)
Admission: EM | Disposition: A | Discharge status: Home Health | Payer: Self-pay | Source: Home / Self Care | Attending: Internal Medicine

## 2021-08-31 ENCOUNTER — Telehealth: Payer: Medicare HMO | Admitting: Physician Assistant

## 2021-08-31 ENCOUNTER — Encounter (HOSPITAL_COMMUNITY): Payer: Self-pay | Admitting: Internal Medicine

## 2021-08-31 DIAGNOSIS — I442 Atrioventricular block, complete: Secondary | ICD-10-CM | POA: Diagnosis not present

## 2021-08-31 DIAGNOSIS — J9601 Acute respiratory failure with hypoxia: Secondary | ICD-10-CM | POA: Diagnosis not present

## 2021-08-31 DIAGNOSIS — N19 Unspecified kidney failure: Secondary | ICD-10-CM | POA: Diagnosis not present

## 2021-08-31 DIAGNOSIS — R7881 Bacteremia: Secondary | ICD-10-CM | POA: Diagnosis not present

## 2021-08-31 DIAGNOSIS — R001 Bradycardia, unspecified: Secondary | ICD-10-CM | POA: Diagnosis not present

## 2021-08-31 HISTORY — PX: PACEMAKER IMPLANT: EP1218

## 2021-08-31 LAB — RENAL FUNCTION PANEL
Albumin: 2.4 g/dL — ABNORMAL LOW (ref 3.5–5.0)
Anion gap: 4 — ABNORMAL LOW (ref 5–15)
BUN: 29 mg/dL — ABNORMAL HIGH (ref 8–23)
CO2: 29 mmol/L (ref 22–32)
Calcium: 8.2 mg/dL — ABNORMAL LOW (ref 8.9–10.3)
Chloride: 107 mmol/L (ref 98–111)
Creatinine, Ser: 1.26 mg/dL — ABNORMAL HIGH (ref 0.44–1.00)
GFR, Estimated: 43 mL/min — ABNORMAL LOW (ref >60)
Glucose, Bld: 163 mg/dL — ABNORMAL HIGH (ref 70–99)
Phosphorus: 3 mg/dL (ref 2.5–4.6)
Potassium: 4.9 mmol/L (ref 3.5–5.1)
Sodium: 140 mmol/L (ref 135–145)

## 2021-08-31 LAB — CK: Total CK: 22 U/L — ABNORMAL LOW (ref 38–234)

## 2021-08-31 LAB — CULTURE, BLOOD (ROUTINE X 2): Special Requests: ADEQUATE

## 2021-08-31 LAB — URINALYSIS, COMPLETE (UACMP) WITH MICROSCOPIC
Bilirubin Urine: NEGATIVE
Glucose, UA: NEGATIVE mg/dL
Ketones, ur: NEGATIVE mg/dL
Nitrite: NEGATIVE
Protein, ur: 30 mg/dL — AB
Specific Gravity, Urine: 1.013 (ref 1.005–1.030)
pH: 5 (ref 5.0–8.0)

## 2021-08-31 LAB — GLUCOSE, CAPILLARY
Glucose-Capillary: 160 mg/dL — ABNORMAL HIGH (ref 70–99)
Glucose-Capillary: 157 mg/dL — ABNORMAL HIGH (ref 70–99)
Glucose-Capillary: 135 mg/dL — ABNORMAL HIGH (ref 70–99)
Glucose-Capillary: 123 mg/dL — ABNORMAL HIGH (ref 70–99)
Glucose-Capillary: 199 mg/dL — ABNORMAL HIGH (ref 70–99)
Glucose-Capillary: 177 mg/dL — ABNORMAL HIGH (ref 70–99)

## 2021-08-31 LAB — CBC
HCT: 25.2 % — ABNORMAL LOW (ref 36.0–46.0)
Hemoglobin: 7.6 g/dL — ABNORMAL LOW (ref 12.0–15.0)
MCH: 26.1 pg (ref 26.0–34.0)
MCHC: 30.2 g/dL (ref 30.0–36.0)
MCV: 86.6 fL (ref 80.0–100.0)
Platelets: 215 10*3/uL (ref 150–400)
RBC: 2.91 MIL/uL — ABNORMAL LOW (ref 3.87–5.11)
RDW: 15.6 % — ABNORMAL HIGH (ref 11.5–15.5)
WBC: 7.5 10*3/uL (ref 4.0–10.5)
nRBC: 0 % (ref 0.0–0.2)

## 2021-08-31 LAB — MAGNESIUM: Magnesium: 2.4 mg/dL (ref 1.7–2.4)

## 2021-08-31 LAB — PROCALCITONIN: Procalcitonin: <0.1 ng/mL

## 2021-08-31 SURGERY — PACEMAKER IMPLANT

## 2021-08-31 MED ORDER — MOMETASONE FURO-FORMOTEROL FUM 100-5 MCG/ACT IN AERO
2.0000 | INHALATION_SPRAY | Freq: Two times a day (BID) | RESPIRATORY_TRACT | Status: DC
  Administered 2021-09-01 – 2021-09-03 (×5): 2 via RESPIRATORY_TRACT
  Filled 2021-08-31: qty 8.8

## 2021-08-31 MED ORDER — MOMETASONE FURO-FORMOTEROL FUM 200-5 MCG/ACT IN AERO: 2.0000 | INHALATION_SPRAY | Freq: Two times a day (BID) | RESPIRATORY_TRACT | Status: DC

## 2021-08-31 MED ORDER — CEFAZOLIN SODIUM-DEXTROSE 2-4 GM/100ML-% IV SOLN
2.0000 g | Freq: Three times a day (TID) | INTRAVENOUS | Status: DC
  Administered 2021-08-31 – 2021-09-03 (×9): 2 g via INTRAVENOUS
  Filled 2021-08-31 (×10): qty 100

## 2021-08-31 MED ORDER — FENTANYL CITRATE (PF) 100 MCG/2ML IJ SOLN
INTRAMUSCULAR | Status: DC | PRN
Start: 2021-08-31 — End: 2021-08-31
  Administered 2021-08-31 (×2): 12.5 ug via INTRAVENOUS

## 2021-08-31 MED ORDER — LIDOCAINE HCL (PF) 1 % IJ SOLN
INTRAMUSCULAR | Status: AC
  Filled 2021-08-31: qty 90

## 2021-08-31 MED ORDER — VANCOMYCIN HCL IN DEXTROSE 1-5 GM/200ML-% IV SOLN
1000.0000 mg | INTRAVENOUS | Status: AC
  Administered 2021-08-31: 1000 mg via INTRAVENOUS
  Filled 2021-08-31: qty 200

## 2021-08-31 MED ORDER — SODIUM CHLORIDE 0.9 % IV SOLN
80.0000 mg | INTRAVENOUS | Status: AC
  Administered 2021-08-31: 80 mg

## 2021-08-31 MED ORDER — VANCOMYCIN HCL IN DEXTROSE 1-5 GM/200ML-% IV SOLN
1000.0000 mg | Freq: Once | INTRAVENOUS | Status: AC
  Administered 2021-09-01: 1000 mg via INTRAVENOUS
  Filled 2021-08-31: qty 200

## 2021-08-31 MED ORDER — SODIUM CHLORIDE 0.9 % IV SOLN
INTRAVENOUS | Status: AC
  Filled 2021-08-31: qty 2

## 2021-08-31 MED ORDER — SODIUM CHLORIDE 0.9% FLUSH
3.0000 mL | Freq: Two times a day (BID) | INTRAVENOUS | Status: DC
  Administered 2021-08-31 – 2021-09-03 (×7): 3 mL via INTRAVENOUS

## 2021-08-31 MED ORDER — CEFAZOLIN SODIUM-DEXTROSE 2-4 GM/100ML-% IV SOLN
INTRAVENOUS | Status: AC
  Filled 2021-08-31: qty 100

## 2021-08-31 MED ORDER — HEPARIN (PORCINE) IN NACL 1000-0.9 UT/500ML-% IV SOLN
INTRAVENOUS | Status: AC
  Filled 2021-08-31: qty 500

## 2021-08-31 MED ORDER — ACETAMINOPHEN 325 MG PO TABS
325.0000 mg | ORAL_TABLET | ORAL | Status: DC | PRN
  Administered 2021-08-31 – 2021-09-02 (×5): 650 mg via ORAL
  Administered 2021-09-03: 325 mg via ORAL
  Filled 2021-08-31: qty 1
  Filled 2021-08-31 (×5): qty 2

## 2021-08-31 MED ORDER — AMLODIPINE BESYLATE 5 MG PO TABS
5.0000 mg | ORAL_TABLET | Freq: Every day | ORAL | Status: DC
Start: 2021-09-01 — End: 2021-08-31

## 2021-08-31 MED ORDER — MIDAZOLAM HCL 5 MG/5ML IJ SOLN
INTRAMUSCULAR | Status: AC
  Filled 2021-08-31: qty 5

## 2021-08-31 MED ORDER — CHLORHEXIDINE GLUCONATE 4 % EX LIQD
60.0000 mL | Freq: Once | CUTANEOUS | Status: AC
  Administered 2021-08-31: 4 via TOPICAL

## 2021-08-31 MED ORDER — MIDAZOLAM HCL 5 MG/5ML IJ SOLN
INTRAMUSCULAR | Status: DC | PRN
  Administered 2021-08-31 (×2): 1 mg via INTRAVENOUS

## 2021-08-31 MED ORDER — SODIUM CHLORIDE 0.9 % IV SOLN: INTRAVENOUS | Status: DC

## 2021-08-31 MED ORDER — OXYCODONE HCL 5 MG PO TABS
5.0000 mg | ORAL_TABLET | Freq: Three times a day (TID) | ORAL | Status: DC | PRN
  Administered 2021-09-01 – 2021-09-03 (×8): 5 mg via ORAL
  Filled 2021-08-31 (×9): qty 1

## 2021-08-31 MED ORDER — HYDRALAZINE HCL 20 MG/ML IJ SOLN
10.0000 mg | Freq: Four times a day (QID) | INTRAMUSCULAR | Status: DC | PRN
  Administered 2021-08-31 (×2): 10 mg via INTRAVENOUS
  Filled 2021-08-31 (×2): qty 1

## 2021-08-31 MED ORDER — HYDRALAZINE HCL 20 MG/ML IJ SOLN
20.0000 mg | Freq: Four times a day (QID) | INTRAMUSCULAR | Status: DC | PRN
Start: 2021-08-31 — End: 2021-09-03
  Administered 2021-09-01 – 2021-09-03 (×4): 20 mg via INTRAVENOUS
  Filled 2021-08-31 (×4): qty 1

## 2021-08-31 MED ORDER — FENTANYL CITRATE (PF) 100 MCG/2ML IJ SOLN
INTRAMUSCULAR | Status: AC
  Filled 2021-08-31: qty 2

## 2021-08-31 MED ORDER — SODIUM CHLORIDE 0.9% FLUSH: 3.0000 mL | INTRAVENOUS | Status: DC | PRN

## 2021-08-31 MED ORDER — CHLORHEXIDINE GLUCONATE 4 % EX LIQD: 60.0000 mL | Freq: Once | CUTANEOUS | Status: AC

## 2021-08-31 MED ORDER — SODIUM CHLORIDE 0.9 % IV SOLN: 250.0000 mL | INTRAVENOUS | Status: DC

## 2021-08-31 MED ORDER — ONDANSETRON HCL 4 MG/2ML IJ SOLN: 4.0000 mg | Freq: Four times a day (QID) | INTRAMUSCULAR | Status: DC | PRN

## 2021-08-31 MED ORDER — HYDROCHLOROTHIAZIDE 25 MG PO TABS
25.0000 mg | ORAL_TABLET | Freq: Every day | ORAL | Status: DC
  Administered 2021-09-01 – 2021-09-03 (×3): 25 mg via ORAL
  Filled 2021-08-31 (×3): qty 1

## 2021-08-31 MED ORDER — HEPARIN (PORCINE) IN NACL 1000-0.9 UT/500ML-% IV SOLN
INTRAVENOUS | Status: DC | PRN
  Administered 2021-08-31: 500 mL

## 2021-08-31 MED ORDER — LIDOCAINE HCL (PF) 1 % IJ SOLN
INTRAMUSCULAR | Status: DC | PRN
  Administered 2021-08-31: 60 mL

## 2021-08-31 SURGICAL SUPPLY — 14 items
ATTRACTOMAT 16X20 MAGNETIC DRP (DRAPES) ×1 IMPLANT
CABLE SURGICAL S-101-97-12 (CABLE) ×3 IMPLANT
CATH CPS LOCATOR 3D MED (CATHETERS) ×1 IMPLANT
HELIX LOCKING TOOL (MISCELLANEOUS) ×2
LEAD TENDRIL MRI 52CM LPA1200M (Lead) ×1 IMPLANT
LEAD TENDRIL SDX 2088TC-58CM (Lead) ×1 IMPLANT
PACEMAKER ASSURITY DR-RF (Pacemaker) ×1 IMPLANT
PAD DEFIB RADIO PHYSIO CONN (PAD) ×3 IMPLANT
SHEATH 8FR PRELUDE SNAP 13 (SHEATH) ×1 IMPLANT
SHEATH 9FR PRELUDE SNAP 13 (SHEATH) ×1 IMPLANT
SLITTER AGILIS HISPRO (INSTRUMENTS) ×1 IMPLANT
TOOL HELIX LOCKING (MISCELLANEOUS) IMPLANT
TRAY PACEMAKER INSERTION (PACKS) ×3 IMPLANT
WIRE HI TORQ VERSACORE-J 145CM (WIRE) ×1 IMPLANT

## 2021-08-31 NOTE — Progress Notes (Addendum)
Progress Note  Patient Name: Virginia Summers Date of Encounter: 08/31/2021  [facility] Cardiologist: Kristeen Miss, MD   Subjective   Feels OK   Inpatient Medications    Scheduled Meds:  Chlorhexidine Gluconate Cloth  6 each Topical Daily   DULoxetine  60 mg Oral QHS   [COMPLETED] gentamicin (GARAMYCIN) 80 mg in sodium chloride 0.9 % 500 mL irrigation  80 mg Irrigation On Call   gentamicin (GARAMYCIN) 80 mg in sodium chloride 0.9 % 500 mL irrigation  80 mg Irrigation On Call   insulin aspart  0-6 Units Subcutaneous Q4H   levothyroxine  25 mcg Oral QAC breakfast   pantoprazole (PROTONIX) IV  40 mg Intravenous QHS   sodium chloride flush  10-40 mL Intracatheter Q12H   sodium chloride flush  3 mL Intravenous Q12H   Continuous Infusions:  sodium chloride 50 mL/hr at 08/31/21 0900   sodium chloride      ceFAZolin (ANCEF) IV     vancomycin     PRN Meds: docusate sodium, magic mouthwash w/lidocaine, mouth rinse, polyethylene glycol, sodium chloride flush   Vital Signs    Vitals:   08/31/21 0737 08/31/21 0746 08/31/21 0800 08/31/21 0900  BP: (!) 172/51  (!) 169/75 (!) 176/45  Pulse: (!) 48  (!) 49 (!) 53  Resp: (!) 23  13 19   Temp:  97.7 F (36.5 C)    TempSrc:  Oral    SpO2: 99%  99% 95%  Weight:      Height:        Intake/Output Summary (Last 24 hours) at 08/31/2021 1121 Last data filed at 08/31/2021 0900 Gross per 24 hour  Intake 1403.66 ml  Output 1370 ml  Net 33.66 ml      08/30/2021    4:00 AM 08/29/2021    2:16 AM 08/28/2021   11:30 PM  Last 3 Weights  Weight (lbs) 189 lb 2.5 oz 190 lb 0.6 oz 190 lb 0.6 oz  Weight (kg) 85.8 kg 86.2 kg 86.2 kg      Telemetry    SB 50s, some sloer junctional rhythm - Personally Reviewed  ECG    No new EKGs - Personally Reviewed  Physical Exam   GEN: No acute distress.   Neck: No JVD, R IJ triple lumen Cardiac: RRR, no murmurs, rubs, or gallops.  Respiratory: CTA b/l GI: Soft, nontender, non-distended  MS: No  edema; No deformity. Neuro:  Nonfocal  Psych: Normal affect   Labs    High Sensitivity Troponin:   Recent Labs  Lab 08/27/21 1425 08/27/21 1716 08/29/21 0354  TROPONINIHS 14 24* 37*     Chemistry Recent Labs  Lab 08/27/21 1425 08/27/21 1505 08/29/21 0330 08/29/21 1839 08/30/21 0350 08/31/21 0535  NA 131*   < > 137 138 140 140  K >7.5*   < > 4.4 4.5 4.5 4.9  CL 105   < > 104 102 104 107  CO2 18*   < > 28 29 29 29   GLUCOSE 211*   < > 145* 124* 105* 163*  BUN 70*   < > 24* 33* 38* 29*  CREATININE 2.73*   < > 1.57* 1.94* 1.79* 1.26*  CALCIUM 9.0   < > 8.0* 8.5* 8.5* 8.2*  MG  --    < > 2.6*  --  2.7* 2.4  PROT 7.8  --  6.5  --  6.4*  --   ALBUMIN 3.7   < > 2.8* 3.0* 2.9* 2.4*  AST 20  --  27  --  17  --   ALT 20  --  22  --  15  --   ALKPHOS 58  --  44  --  44  --   BILITOT 0.9  --  0.3  --  0.3  --   GFRNONAA 17*   < > 33* 26* 28* 43*  ANIONGAP 8   < > 5 7 7  4*   < > = values in this interval not displayed.    Lipids No results for input(s): "CHOL", "TRIG", "HDL", "LABVLDL", "LDLCALC", "CHOLHDL" in the last 168 hours.  Hematology Recent Labs  Lab 08/29/21 0330 08/30/21 0350 08/31/21 0535  WBC 11.0* 7.4 7.5  RBC 3.00* 3.32* 2.91*  HGB 7.8* 8.4* 7.6*  HCT 26.0* 28.6* 25.2*  MCV 86.7 86.1 86.6  MCH 26.0 25.3* 26.1  MCHC 30.0 29.4* 30.2  RDW 16.2* 15.8* 15.6*  PLT 238 245 215   Thyroid  Recent Labs  Lab 08/28/21 0256  TSH 0.613  FREET4 1.04    BNP Recent Labs  Lab 08/27/21 1425  BNP 1,166.7*    DDimer  Recent Labs  Lab 08/27/21 1427  DDIMER 1.10*     Radiology    No results found.  Cardiac Studies   Echocardiogram 08/28/21   1. Septal hypokinesis consistent with ventricular pacing. Left  ventricular ejection fraction, by estimation, is 50 to 55%. The left  ventricle has low normal function. The left ventricle demonstrates  regional wall motion abnormalities (see scoring  diagram/findings for description). There is mild concentric left   ventricular hypertrophy. Left ventricular diastolic parameters are  consistent with Grade I diastolic dysfunction (impaired relaxation).  Elevated left ventricular end-diastolic pressure.   2. Right ventricular systolic function is normal. The right ventricular  size is normal. There is moderately elevated pulmonary artery systolic  pressure.   3. Left atrial size was mildly dilated.   4. The mitral valve is normal in structure. Trivial mitral valve  regurgitation. No evidence of mitral stenosis.   5. The aortic valve is tricuspid. Aortic valve regurgitation is not  visualized. No aortic stenosis is present.   6. The inferior vena cava is dilated in size with >50% respiratory  variability, suggesting right atrial pressure of 8 mmHg.   Patient Profile     81 y.o. female w/PMHx of HTN, hypothyroidism, DM, DVT/PE, , COPD, obesity, hypoventilation sx (on home O2) presented with 5 days of SOB and near syncope, she was found in CHB treated with atropine > transcutaneous pacing, labs noted AKI and severe hyperkalemia (>7.5), placed on BIPAP and catheter placed for emergent dialysis.  EP brought on board with continued episodes of CHB despite electrolyte correction and renal function improvement, discussed that she had [facility] 08/27/21 that noted staph epi but suspect to be contaminant and planned to transfer for PPM  Assessment & Plan    Intermittent CHB Initially thought to be driven by AKI/severe hyperkalemia Though continued intermittently despite  improvement in her labs She was intermittently transcutaneously paced while at [facility]  Since here she is maintaining SB 50's with some intermittent junctional rhythm that dips to high 40's  08/27/21 BC +staph epidermis, staph capitus (x2) 08/28/21, BC (neg to date) (x2) Appreciate ID input Will plan for PPM today  2. AKI on CKD Nephrology on board Suspect 2/2 new NSAIDs and escalating BP meds perhaps (ARB) Creat continues to trend down Will have  them remove her IJ catheter today prior  to pacer implant  3. HTN Stable, resume meds post pacing Avoiding nephrotoxins  4. 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 Agree with the findings as noted above. The patient is stable and has had no additional symptomatic bradycardia. She has had ongoing improvement in her renal function. I have reviewed the indications for PPM insertion with the patient and she is willing to proceed.  Sharlot Gowda Karolynn Infantino,MD

## 2021-08-31 NOTE — Progress Notes (Signed)
CSW received consult for POC and HCPOA information. CSW met with patient and 2 daughters Santiago Glad and [place] at bedside. Patient confirmed point of contact is her daughters Santiago Glad and Janace Hoard. Patient informed CSW currently does not have HCPOA. Patient request assist with HCPOA paperwork. CSW paged Chaplain and spoke with Trish. Chaplain confirmed they will go see patient regarding HCPOA questions. All questions answered. No further questions reported at this time.

## 2021-08-31 NOTE — Progress Notes (Addendum)
[facility] Progress Note  Subjective:  Patient was transferred to [facility] in the interim.  She had 1.3 liter UOP over 6/19 documented.  ID was consulted for positive blood cultures - they feel these were contaminants.  Cardiology planning for pacemaker today  Review of systems:   Denies n/v/d. She denies shortness of breath  Denies chest pain   Vitals:   08/31/21 0000 08/31/21 0100 08/31/21 0400 08/31/21 0500  BP: (!) 182/45 (!) 178/38 (!) 146/51 (!) 172/35  Pulse: (!) 49 (!) 50 (!) 49 (!) 48  Resp: $Remo'19 17 16 18  'TRuvC$ Temp: 97.9 F (36.6 C)     TempSrc: Oral     SpO2: 97% 97% 99% 96%  Weight:      Height:        Physical Exam:  General elderly female in bed in no acute distress HEENT normocephalic atraumatic extraocular movements intact sclera anicteric Neck supple trachea midline Lungs clear to auscultation bilaterally normal work of breathing at rest  Heart bradycardia no rub Abdomen soft nontender nondistended Extremities no edema  Psych normal mood and affect Neuro - alert and oriented x 3 provides hx and follows commands; slightly hard of hearing Access: RIJ nontunneled HD catheter   Home meds include - amlodipine, lipitor, symbicort, celexa, hygroton, cymbalta, tricor, hydralazine 75 tid, synthroid, metformin, Kdur 10 qd, xarelto, trazodone, valsartan 320 qd        Date              Creat               eGFR   2018              0.95- 1.12   2021              1.08- 1.52        32- 57 ml/min     Feb 2022       1.19   Sept 2022      2.84 >> 0.96    16- 60 ml/min   08/27/2021      2.73, 2.90     Assessment/ Plan: AKI on CKD 3a - b/l creatinine is 0.9- 1.5, eGFR 32- 60 from 2021/ 2022. Creat here is 2.7 in setting of recent addition of Cox II inhibitor and recent escalation of BP meds (possibly losartan) in OP setting. Avoid nsaids/ COX 2 inhibitors and acei/ ARB for now. CRRT started on 6/16. AKI was mild and CRRT was done primarily for resolution of severe  hyperkalemia in setting of heart block. CRRT dc'd on 6/18.  Cr improved slightly in the interim off of CRRT Await am labs Continue supportive care Please keep temp HD cath in place for now but anticipate removal on 6/20 if labs stable Severe hyperkalemia - Improved after CRRT and other measures.  Bradycardia - hyperkalemia is now resolved with heart block.  For pacemaker  HTN - avoid ARB/ acei's for now please Hx of DVT/ PE - per primary team  Ckd stage 3a - note as above her baseline Cr is 0.9-1.5 Anemia normocytic - no acute indication for PRBC's. Improved slightly Microscopic hematuria - repeat UA. May have been secondary to foley placement  Disposition per primary team - await AM labs and will plan to sign off if stable or improved.   Recent Labs  Lab 08/29/21 0330 08/29/21 0628 08/29/21 1839 08/30/21 0350  HGB 7.8*  --   --  8.4*  ALBUMIN 2.8*  --  3.0* 2.9*  CALCIUM 8.0*  --  8.5* 8.5*  PHOS 1.9* 1.9* 4.6  --   CREATININE 1.57*  --  1.94* 1.79*  K 4.4  --  4.5 4.5   Inpatient medications:  Chlorhexidine Gluconate Cloth  6 each Topical Daily   DULoxetine  60 mg Oral QHS   heparin injection (subcutaneous)  5,000 Units Subcutaneous Q8H   insulin aspart  0-6 Units Subcutaneous Q4H   levothyroxine  25 mcg Oral QAC breakfast   pantoprazole (PROTONIX) IV  40 mg Intravenous QHS   sodium chloride flush  10-40 mL Intracatheter Q12H    sodium chloride 50 mL/hr at 08/31/21 0500    ceFAZolin (ANCEF) IV Stopped (08/30/21 2141)   DAPTOmycin (CUBICIN) 500 mg in sodium chloride 0.9 % IVPB Stopped (08/30/21 1256)   DOPamine Stopped (08/30/21 1553)   docusate sodium, magic mouthwash w/lidocaine, mouth rinse, polyethylene glycol, sodium chloride flush   Claudia Desanctis, MD 08/31/2021 6:10 AM   Addendum:  Labs back. Cr 1.26.  Order placed to remove the nontunneled HD catheter RIJ.  Microscopic hematuria also clearing.   Nephrology will set up routine outpatient follow-up for mild  CKD in 4-6 weeks.    Nephrology will sign off.  Please do not hesitate to contact me with any questions  Claudia Desanctis, MD 9:58 AM 08/31/2021

## 2021-08-31 NOTE — H&P (View-Only) (Signed)
Progress Note  Patient Name: Virginia Summers Date of Encounter: 08/31/2021  [facility] Cardiologist: Kristeen Miss, MD   Subjective   Feels OK   Inpatient Medications    Scheduled Meds:  Chlorhexidine Gluconate Cloth  6 each Topical Daily   DULoxetine  60 mg Oral QHS   [COMPLETED] gentamicin (GARAMYCIN) 80 mg in sodium chloride 0.9 % 500 mL irrigation  80 mg Irrigation On Call   gentamicin (GARAMYCIN) 80 mg in sodium chloride 0.9 % 500 mL irrigation  80 mg Irrigation On Call   insulin aspart  0-6 Units Subcutaneous Q4H   levothyroxine  25 mcg Oral QAC breakfast   pantoprazole (PROTONIX) IV  40 mg Intravenous QHS   sodium chloride flush  10-40 mL Intracatheter Q12H   sodium chloride flush  3 mL Intravenous Q12H   Continuous Infusions:  sodium chloride 50 mL/hr at 08/31/21 0900   sodium chloride      ceFAZolin (ANCEF) IV     vancomycin     PRN Meds: docusate sodium, magic mouthwash w/lidocaine, mouth rinse, polyethylene glycol, sodium chloride flush   Vital Signs    Vitals:   08/31/21 0737 08/31/21 0746 08/31/21 0800 08/31/21 0900  BP: (!) 172/51  (!) 169/75 (!) 176/45  Pulse: (!) 48  (!) 49 (!) 53  Resp: (!) 23  13 19   Temp:  97.7 F (36.5 C)    TempSrc:  Oral    SpO2: 99%  99% 95%  Weight:      Height:        Intake/Output Summary (Last 24 hours) at 08/31/2021 1121 Last data filed at 08/31/2021 0900 Gross per 24 hour  Intake 1403.66 ml  Output 1370 ml  Net 33.66 ml      08/30/2021    4:00 AM 08/29/2021    2:16 AM 08/28/2021   11:30 PM  Last 3 Weights  Weight (lbs) 189 lb 2.5 oz 190 lb 0.6 oz 190 lb 0.6 oz  Weight (kg) 85.8 kg 86.2 kg 86.2 kg      Telemetry    SB 50s, some sloer junctional rhythm - Personally Reviewed  ECG    No new EKGs - Personally Reviewed  Physical Exam   GEN: No acute distress.   Neck: No JVD, R IJ triple lumen Cardiac: RRR, no murmurs, rubs, or gallops.  Respiratory: CTA b/l GI: Soft, nontender, non-distended  MS: No  edema; No deformity. Neuro:  Nonfocal  Psych: Normal affect   Labs    High Sensitivity Troponin:   Recent Labs  Lab 08/27/21 1425 08/27/21 1716 08/29/21 0354  TROPONINIHS 14 24* 37*     Chemistry Recent Labs  Lab 08/27/21 1425 08/27/21 1505 08/29/21 0330 08/29/21 1839 08/30/21 0350 08/31/21 0535  NA 131*   < > 137 138 140 140  K >7.5*   < > 4.4 4.5 4.5 4.9  CL 105   < > 104 102 104 107  CO2 18*   < > 28 29 29 29   GLUCOSE 211*   < > 145* 124* 105* 163*  BUN 70*   < > 24* 33* 38* 29*  CREATININE 2.73*   < > 1.57* 1.94* 1.79* 1.26*  CALCIUM 9.0   < > 8.0* 8.5* 8.5* 8.2*  MG  --    < > 2.6*  --  2.7* 2.4  PROT 7.8  --  6.5  --  6.4*  --   ALBUMIN 3.7   < > 2.8* 3.0* 2.9* 2.4*  AST 20  --  27  --  17  --   ALT 20  --  22  --  15  --   ALKPHOS 58  --  44  --  44  --   BILITOT 0.9  --  0.3  --  0.3  --   GFRNONAA 17*   < > 33* 26* 28* 43*  ANIONGAP 8   < > 5 7 7 4*   < > = values in this interval not displayed.    Lipids No results for input(s): "CHOL", "TRIG", "HDL", "LABVLDL", "LDLCALC", "CHOLHDL" in the last 168 hours.  Hematology Recent Labs  Lab 08/29/21 0330 08/30/21 0350 08/31/21 0535  WBC 11.0* 7.4 7.5  RBC 3.00* 3.32* 2.91*  HGB 7.8* 8.4* 7.6*  HCT 26.0* 28.6* 25.2*  MCV 86.7 86.1 86.6  MCH 26.0 25.3* 26.1  MCHC 30.0 29.4* 30.2  RDW 16.2* 15.8* 15.6*  PLT 238 245 215   Thyroid  Recent Labs  Lab 08/28/21 0256  TSH 0.613  FREET4 1.04    BNP Recent Labs  Lab 08/27/21 1425  BNP 1,166.7*    DDimer  Recent Labs  Lab 08/27/21 1427  DDIMER 1.10*     Radiology    No results found.  Cardiac Studies   Echocardiogram 08/28/21   1. Septal hypokinesis consistent with ventricular pacing. Left  ventricular ejection fraction, by estimation, is 50 to 55%. The left  ventricle has low normal function. The left ventricle demonstrates  regional wall motion abnormalities (see scoring  diagram/findings for description). There is mild concentric left   ventricular hypertrophy. Left ventricular diastolic parameters are  consistent with Grade I diastolic dysfunction (impaired relaxation).  Elevated left ventricular end-diastolic pressure.   2. Right ventricular systolic function is normal. The right ventricular  size is normal. There is moderately elevated pulmonary artery systolic  pressure.   3. Left atrial size was mildly dilated.   4. The mitral valve is normal in structure. Trivial mitral valve  regurgitation. No evidence of mitral stenosis.   5. The aortic valve is tricuspid. Aortic valve regurgitation is not  visualized. No aortic stenosis is present.   6. The inferior vena cava is dilated in size with >50% respiratory  variability, suggesting right atrial pressure of 8 mmHg.   Patient Profile     80 y.o. female w/PMHx of HTN, hypothyroidism, DM, DVT/PE, , COPD, obesity, hypoventilation sx (on home O2) presented with 5 days of SOB and near syncope, she was found in CHB treated with atropine > transcutaneous pacing, labs noted AKI and severe hyperkalemia (>7.5), placed on BIPAP and catheter placed for emergent dialysis.  EP brought on board with continued episodes of CHB despite electrolyte correction and renal function improvement, discussed that she had [facility] 08/27/21 that noted staph epi but suspect to be contaminant and planned to transfer for PPM  Assessment & Plan    Intermittent CHB Initially thought to be driven by AKI/severe hyperkalemia Though continued intermittently despite  improvement in her labs She was intermittently transcutaneously paced while at [facility]  Since here she is maintaining SB 50's with some intermittent junctional rhythm that dips to high 40's  08/27/21 BC +staph epidermis, staph capitus (x2) 08/28/21, BC (neg to date) (x2) Appreciate ID input Will plan for PPM today  2. AKI on CKD Nephrology on board Suspect 2/2 new NSAIDs and escalating BP meds perhaps (ARB) Creat continues to trend down Will have  them remove her IJ catheter today prior   to pacer implant  3. HTN Stable, resume meds post pacing Avoiding nephrotoxins  4. 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 Agree with the findings as noted above. The patient is stable and has had no additional symptomatic bradycardia. She has had ongoing improvement in her renal function. I have reviewed the indications for PPM insertion with the patient and she is willing to proceed.  Sharlot Gowda Manoah Deckard,MD

## 2021-08-31 NOTE — Plan of Care (Signed)
  Problem: Education: Goal: Knowledge of General Education information will improve Description: Including pain rating scale, medication(s)/side effects and non-pharmacologic comfort measures Outcome: Progressing   Problem: Clinical Measurements: Goal: Will remain free from infection Outcome: Progressing Goal: Respiratory complications will improve Outcome: Progressing   

## 2021-08-31 NOTE — Progress Notes (Signed)
Pt doesn't want to wear CPAP for the night. 

## 2021-08-31 NOTE — Plan of Care (Signed)
  Problem: Education: Goal: Knowledge of General Education information will improve Description: Including pain rating scale, medication(s)/side effects and non-pharmacologic comfort measures Outcome: Progressing   Problem: Clinical Measurements: Goal: Will remain free from infection Outcome: Progressing Goal: Diagnostic test results will improve Outcome: Progressing Goal: Respiratory complications will improve Outcome: Progressing Goal: Cardiovascular complication will be avoided Outcome: Progressing   Problem: Pain Managment: Goal: General experience of comfort will improve Outcome: Progressing   Problem: Safety: Goal: Ability to remain free from injury will improve Outcome: Progressing

## 2021-08-31 NOTE — Progress Notes (Signed)
PHARMACY NOTE:  ANTIMICROBIAL RENAL DOSAGE ADJUSTMENT  Current antimicrobial regimen includes a mismatch between antimicrobial dosage and estimated renal function.  As per policy approved by the Pharmacy & Therapeutics and Medical Executive Committees, the antimicrobial dosage will be adjusted accordingly.  Current antimicrobial dosage:  Ancef 2g, IV q12h  Indication: MSSE bacteremia  Renal Function:  Estimated Creatinine Clearance: 34.1 mL/min (A) (by C-G formula based on SCr of 1.26 mg/dL (H)).     Antimicrobial dosage has been changed to:  Ancef 2g, IV q12h  Additional comments:   Thank you for allowing pharmacy to be a part of this patient's care.  Harland German, PharmD Clinical Pharmacist **Pharmacist phone directory can now be found on [email or web address] ([email or web address]).  Listed under [facility].

## 2021-08-31 NOTE — Interval H&P Note (Signed)
History and Physical Interval Note:  08/31/2021 1:34 PM  Virginia Summers  has presented today for surgery, with the diagnosis of chest pain.  The various methods of treatment have been discussed with the patient and family. After consideration of risks, benefits and other options for treatment, the patient has consented to  Procedure(s): PACEMAKER IMPLANT (N/A) as a surgical intervention.  The patient's history has been reviewed, patient examined, no change in status, stable for surgery.  I have reviewed the patient's chart and labs.  Questions were answered to the patient's satisfaction.     Lewayne Bunting

## 2021-08-31 NOTE — Progress Notes (Signed)
NAME:  Virginia Summers, MRN:  [id number], DOB:  1940/09/04, LOS: 4 ADMISSION DATE:  08/27/2021, CONSULTATION DATE:  08/27/2021  REFERRING MD:  Dr Bruce Donath of ER, CHIEF COMPLAINT:  Hyperkalemia., complete heart block AKI, resp failure  Cardiologist Dr. Cheral Bay Nahser  BRIEF  81 year old female who is bedbound at baseline secondary to back pain. Admitted 6/16 with AKI, hyperkalemia, and CHB. CRRT x 2 days. Continued to have CHB requiring intermittent transcutaneous pacing. Transferred to [facility] 6/19 for cardiology evaluation.   Past Medical History:   has a past medical history of Anticoagulant long-term use, Chest pain, Diabetes mellitus, Diverticulitis, DVT (deep venous thrombosis) (HCC), GERD (gastroesophageal reflux disease), HTN (hypertension), Hypothyroidism, and Pulmonary embolism (HCC).   has a past surgical history that includes Back surgery (2000); Partial hysterectomy (AGE 44's APPROX.); Appendectomy (AGE 88); Shoulder surgery (5-6 YRS OLD); and Secondary intraocular lens implantation (1540,[id number]).  Significant Hospital Events:  08/27/2021 - admit. [facility] ICU. Renal consult, cards, consult., CCM admit. S HD PLACED, Start CRRT., On BiPAP. Externally paced  6/17 - CRRT continues.  Negative balance over 100 cc/h and tolerating it well.  Blood pressure still high.  Not making urine.  Still being externally paced and pacer dependent.  Dr. Rennis Golden cardiology at the bedside and feels patient has intrinsic conduction disease but wants to wait another 24 hours before making a determination.  Respiratory status much improved on BiPAP.  40% FiO2.  - Turned pacer off ad HR dropped to 30s and pacer turned back on  6/18 off CRRT. Blood cx positive for staph epi and staph captitis. Back on TCP overnight.   6/19 transferred to [facility] for PPM placement    Interim History / Subjective:  Remain off transcutaneous pacer Heart rate remain in 40s with stable blood pressure  Dopamine  was titrated off  No overnight issues  Objective   Blood pressure (!) 176/45, pulse (!) 53, temperature 97.7 F (36.5 C), temperature source Oral, resp. rate 19, height 5' (1.524 m), weight 85.8 kg, SpO2 95 %.        Intake/Output Summary (Last 24 hours) at 08/31/2021 0932 Last data filed at 08/31/2021 0900 Gross per 24 hour  Intake 1603.47 ml  Output 1370 ml  Net 233.47 ml   Filed Weights   08/28/21 2330 08/29/21 0216 08/30/21 0400  Weight: 86.2 kg 86.2 kg 85.8 kg    Examination: Physical exam: General: Acute on chronically ill-appearing female, lying on the bed HEENT: [place]/AT, eyes anicteric.  moist mucus membranes Neuro: Alert, awake following commands Chest: Coarse breath sounds, no wheezes or rhonchi Heart: Bradycardic, regular rhythm, no murmurs or gallops Abdomen: Soft, nontender, nondistended, bowel sounds present Skin: No rash  Resolved Hospital Problem list     Assessment & Plan:  Intermittent complete heart block with symptomatic bradycardia Patient remained severely bradycardic into 40s Appreciate cardiology follow-up Patient is scheduled for PPM today Dopamine was titrated off Zoll at bedside for TC pacing PRN, but has not required in last 48 hours If she develops hemodynamic instability, she may need TVP  Bacteremia versus probably contamination Blood cultures from two separate sites both growing staph epi and staph capitis She remained afebrile, white count remained stable Appreciate ID recommendations to continue daptomycin for now but likely is contamination and will be stopped shortly  Acute UTI with Klebsiella Continue cefazolin to complete 5 days therapy  AKI on CKD stage 3a likely in the setting of hypertensive crisis, metformin, meloxicam +  dehydration. Complicated by life threatening hyperkalemia s/p CRRT 6/16 > 6/18 Serum creatinine continue to improve, now down to 1.2 which is at baseline Serum potassium has been corrected Appreciate  nephrology follow-up Avoid nephrotoxic agent Monitor intake and output  Acute on chronic hypoxemic respiratory failure> improved with volume removal. On 3 L nocturnal oxygen at home.  Possibly undiagnosed OSA Currently on baseline home oxygen therapy  Hypertensive crisis improved with history of severe uncontrolled hypertension with previous admissions for hypertensive emergency  On Diovan, hydralazine and amlodipine at home Restarted home amlodipine  Chronic HFpEF Patient looks euvolemic Monitor intake and output   History of extensive bilateral pulmonary embolism 05/26/2010 (negative evaluation CT February 2021 and September 2022] -on permanent anticoagulation with currently on proph dose Eliquis  Holding full [facility] pending pacemaker placement VTE ppx dosing heparin for now  Diabetes type 2 Fingerstick remain well controlled Continue sliding scale with CBG goal 140-180 Hemoglobin A1c 7.6  Hyperlipidemia -on fenofibrate, Lipitor Hypothyroidism  on Synthroid (TSH normal this admission]   Best practice (daily eval):  Diet: npo for PPM Pain/Anxiety/Delirium protocol (if indicated): none VAP protocol (if indicated): x DVT prophylaxis: Prophylaxis dose of Eliquis at home ->  Heparin DVT prophylaxis -> start po eliquis low dose when appropriate GI prophylaxis: PPI Glucose control: SSI Mobility: bed rest Code Status: full   Total critical care time: 33 minutes  Performed by: Cheri Fowler   Critical care time was exclusive of separately billable procedures and treating other patients.   Critical care was necessary to treat or prevent imminent or life-threatening deterioration.   Critical care was time spent personally by me on the following activities: development of treatment plan with patient and/or surrogate as well as nursing, discussions with consultants, evaluation of patient's response to treatment, examination of patient, obtaining history from patient or surrogate,  ordering and performing treatments and interventions, ordering and review of laboratory studies, ordering and review of radiographic studies, pulse oximetry and re-evaluation of patient's condition.   Cheri Fowler, MD [facility] See Amion for pager If no response to pager, please call [phone] until 7pm After 7pm, Please call E-link [phone]

## 2021-08-31 NOTE — Progress Notes (Signed)
Progress Note  Patient Name: Virginia Summers Date of Encounter: 08/31/2021  [facility] Cardiologist: Kristeen Miss, MD   Subjective   Feeling well comfortable.  No chest pain no shortness of breath.  Inpatient Medications    Scheduled Meds:  Chlorhexidine Gluconate Cloth  6 each Topical Daily   DULoxetine  60 mg Oral QHS   [COMPLETED] gentamicin (GARAMYCIN) 80 mg in sodium chloride 0.9 % 500 mL irrigation  80 mg Irrigation On Call   gentamicin (GARAMYCIN) 80 mg in sodium chloride 0.9 % 500 mL irrigation  80 mg Irrigation On Call   insulin aspart  0-6 Units Subcutaneous Q4H   levothyroxine  25 mcg Oral QAC breakfast   pantoprazole (PROTONIX) IV  40 mg Intravenous QHS   sodium chloride flush  10-40 mL Intracatheter Q12H   sodium chloride flush  3 mL Intravenous Q12H   Continuous Infusions:  sodium chloride 50 mL/hr at 08/31/21 0900   sodium chloride      ceFAZolin (ANCEF) IV     vancomycin     PRN Meds: docusate sodium, magic mouthwash w/lidocaine, mouth rinse, polyethylene glycol, sodium chloride flush, sodium chloride flush   Vital Signs    Vitals:   08/31/21 0737 08/31/21 0746 08/31/21 0800 08/31/21 0900  BP: (!) 172/51  (!) 169/75 (!) 176/45  Pulse: (!) 48  (!) 49 (!) 53  Resp: (!) 23  13 19   Temp:  97.7 F (36.5 C)    TempSrc:  Oral    SpO2: 99%  99% 95%  Weight:      Height:        Intake/Output Summary (Last 24 hours) at 08/31/2021 0944 Last data filed at 08/31/2021 0900 Gross per 24 hour  Intake 1603.47 ml  Output 1370 ml  Net 233.47 ml      08/30/2021    4:00 AM 08/29/2021    2:16 AM 08/28/2021   11:30 PM  Last 3 Weights  Weight (lbs) 189 lb 2.5 oz 190 lb 0.6 oz 190 lb 0.6 oz  Weight (kg) 85.8 kg 86.2 kg 86.2 kg      Telemetry    Sinus bradycardia- Personally Reviewed  ECG    No new- Personally Reviewed  Physical Exam   GEN: No acute distress.   Neck: No JVD Cardiac: Mildly bradycardic, no murmurs, rubs, or gallops.  Respiratory:  Clear to auscultation bilaterally. GI: Soft, nontender, non-distended  MS: No edema; No deformity. Neuro:  Nonfocal  Psych: Normal affect   Labs    High Sensitivity Troponin:   Recent Labs  Lab 08/27/21 1425 08/27/21 1716 08/29/21 0354  TROPONINIHS 14 24* 37*     Chemistry Recent Labs  Lab 08/27/21 1425 08/27/21 1505 08/29/21 0330 08/29/21 1839 08/30/21 0350 08/31/21 0535  NA 131*   < > 137 138 140 140  K >7.5*   < > 4.4 4.5 4.5 4.9  CL 105   < > 104 102 104 107  CO2 18*   < > 28 29 29 29   GLUCOSE 211*   < > 145* 124* 105* 163*  BUN 70*   < > 24* 33* 38* 29*  CREATININE 2.73*   < > 1.57* 1.94* 1.79* 1.26*  CALCIUM 9.0   < > 8.0* 8.5* 8.5* 8.2*  MG  --    < > 2.6*  --  2.7* 2.4  PROT 7.8  --  6.5  --  6.4*  --   ALBUMIN 3.7   < > 2.8* 3.0*  2.9* 2.4*  AST 20  --  27  --  17  --   ALT 20  --  22  --  15  --   ALKPHOS 58  --  44  --  44  --   BILITOT 0.9  --  0.3  --  0.3  --   GFRNONAA 17*   < > 33* 26* 28* 43*  ANIONGAP 8   < > 5 7 7  4*   < > = values in this interval not displayed.    Lipids No results for input(s): "CHOL", "TRIG", "HDL", "LABVLDL", "LDLCALC", "CHOLHDL" in the last 168 hours.  Hematology Recent Labs  Lab 08/29/21 0330 08/30/21 0350 08/31/21 0535  WBC 11.0* 7.4 7.5  RBC 3.00* 3.32* 2.91*  HGB 7.8* 8.4* 7.6*  HCT 26.0* 28.6* 25.2*  MCV 86.7 86.1 86.6  MCH 26.0 25.3* 26.1  MCHC 30.0 29.4* 30.2  RDW 16.2* 15.8* 15.6*  PLT 238 245 215   Thyroid  Recent Labs  Lab 08/28/21 0256  TSH 0.613  FREET4 1.04    BNP Recent Labs  Lab 08/27/21 1425  BNP 1,166.7*    DDimer  Recent Labs  Lab 08/27/21 1427  DDIMER 1.10*     Radiology    No results found.  Cardiac Studies   EF 50 to 55%, mild left atrial enlargement trivial MR.  Patient Profile     81 y.o. female with transient complete heart block, bradycardia, prior history of extensive bilateral pulmonary embolism, chronic anemia, chronic diastolic heart failure, acute kidney  injury  Assessment & Plan    Complete heart block - Pacemaker today.  NPO.  Currently sinus rhythm/sinus bradycardia.  Has not required any transcutaneous pacing over the last 24 hours.  Anemia of chronic disease - Baseline hemoglobin approximately 9.  Partially dilutional as well. -No signs of bleeding.  Chronic diastolic heart failure - Currently stable, feeling well.  Prior CRRT.  Catheter now removed.  Acute kidney injury - Resolved.  Appreciate nephrology's assistance.  Chronic kidney disease stage IIIb - Renal function markedly improved.  Avoid nephrotoxins.  Hyperkalemia - Prior potassium 7.5, now improved, normal.  Infectious disease - Appreciate infectious disease consultation yesterday.  Prior blood cultures with epidermidis contaminant.  Second blood culture off of antibiotics show no evidence of growth.    For questions or updates, please contact [facility] Please consult [email or web address] for contact info under        Signed, 94, MD  08/31/2021, 9:44 AM

## 2021-08-31 NOTE — Progress Notes (Incomplete)
[facility]  Date of Admission:  08/27/2021      Total days of antibiotics ***          ASSESSMENT: Virginia Summers is a 81 y.o. 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  Prior to admission she was experiencing uncontrolled blood pressures (hypertensive) and worsening shortness of breath for 5 days. She was in severe distress at the time EMS evaluated her and placed on BiPAP; EKG also found to be in complete heart block, hyperkalemic and AKI with SCr > 3. She was started emergently on CRRT to manage AKI and hyperkalemia. Due to abnormal urinalysis and concern for UTI, urine and blood cultures were drawn and patient was monitored off antibiotics until 6/17 when urine cultures growing klebsiella pneumoniae > 100 cfu and she was stated on Cefepime. Blood cultures also were positive from 6/16 but appear to be without any growth from 6/17 repeat without abx treatment.   Coagulase negative staph species bacteremia, - Staph epidermidis and staph capitis (Oxa-S) from Rt forearm draw and Rt hand draw on 6/16 pediatric bottles. Repeat on 6/17 are no growth at 4 days now (repeat draw was without any prior antibiotics).  Likely represent contaminants from venipuncture. Going for PPM placement today.   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 HD cath is still in place for now while monitoring renal recovery.    PLAN: ***  Principal Problem:   Bradycardia Active Problems:   Coagulase negative Staphylococcus bacteremia   Acute respiratory failure (HCC)   Renal failure   Hyperkalemia   Heart block AV complete (HCC)    Chlorhexidine Gluconate Cloth  6 each Topical Daily   DULoxetine   60 mg Oral QHS   [COMPLETED] gentamicin (GARAMYCIN) 80 mg in sodium chloride 0.9 % 500 mL irrigation  80 mg Irrigation On Call   gentamicin (GARAMYCIN) 80 mg in sodium chloride 0.9 % 500 mL irrigation  80 mg Irrigation On Call   insulin aspart  0-6 Units Subcutaneous Q4H   levothyroxine  25 mcg Oral QAC breakfast   pantoprazole (PROTONIX) IV  40 mg Intravenous QHS   sodium chloride flush  10-40 mL Intracatheter Q12H   sodium chloride flush  3 mL Intravenous Q12H    SUBJECTIVE: ***    Review of Systems: ROS  Allergies  Allergen Reactions   Penicillins Swelling and Rash    Did it involve swelling of the face/tongue/throat, SOB, or low BP? No Did it involve sudden or severe rash/hives, skin peeling, or any reaction on the inside of your mouth or nose? No Did you need to seek medical attention at a hospital or doctor's office? No When did it last happen?    childhood   If all above answers are "NO", may proceed with cephalosporin use.   Sulfa Drugs Cross Reactors Swelling and Rash    OBJECTIVE: Vitals:   08/31/21 0737 08/31/21 0746 08/31/21 0800 08/31/21 0900  BP: (!) 172/51  (!) 169/75 (!) 176/45  Pulse: (!) 48  (!) 49 (!) 53  Resp: (!) 23  13 19   Temp:  97.7 F (36.5 C)    TempSrc:  Oral    SpO2: 99%  99% 95%  Weight:      Height:       Body mass index is 36.94 kg/m.  Physical Exam  Lab Results Lab Results  Component Value Date   WBC 7.5 08/31/2021   HGB 7.6 (L) 08/31/2021   HCT 25.2 (L) 08/31/2021   MCV 86.6 08/31/2021   PLT 215 08/31/2021    Lab Results  Component Value Date   CREATININE 1.26 (H) 08/31/2021   BUN 29 (H) 08/31/2021   NA 140 08/31/2021   K 4.9 08/31/2021   CL 107 08/31/2021   CO2 29 08/31/2021    Lab Results  Component Value Date   ALT 15 08/30/2021   AST 17 08/30/2021   ALKPHOS 44 08/30/2021   BILITOT 0.3 08/30/2021     Microbiology: Recent Results (from the past 240 hour(s))  Culture, blood (Routine X 2) w Reflex to ID Panel      Status: Abnormal   Collection Time: 08/27/21  5:16 PM   Specimen: BLOOD  Result Value Ref Range Status   Specimen Description   Final    BLOOD BLOOD RIGHT FOREARM Performed at [facility], [address] [place], [place] [address]    Special Requests   Final    IN PEDIATRIC BOTTLE Blood Culture adequate volume Performed at [facility], [address] [place], [place] [address]    Culture  Setup Time   Final    GRAM POSITIVE COCCI IN CLUSTERS IN PEDIATRIC BOTTLE CRITICAL RESULT CALLED TO, READ BACK BY AND VERIFIED WITH: Lenny PastelHARMD M TUCKER [id number] AT 1435 BY CM Performed at [facility], [address] [place], [place] [address]    Culture (A)  Final    STAPHYLOCOCCUS EPIDERMIDIS STAPHYLOCOCCUS CAPITIS    Report Status 08/31/2021 FINAL  Final   Organism ID, Bacteria STAPHYLOCOCCUS EPIDERMIDIS  Final   Organism ID, Bacteria STAPHYLOCOCCUS CAPITIS  Final      Susceptibility   Staphylococcus capitis - MIC*    CIPROFLOXACIN 2 INTERMEDIATE Intermediate     ERYTHROMYCIN <=0.25 SENSITIVE Sensitive     GENTAMICIN <=0.5 SENSITIVE Sensitive     OXACILLIN <=0.25 SENSITIVE Sensitive     TETRACYCLINE <=1 SENSITIVE Sensitive     VANCOMYCIN <=0.5 SENSITIVE Sensitive     TRIMETH/SULFA <=10 SENSITIVE Sensitive     CLINDAMYCIN <=0.25 SENSITIVE Sensitive     RIFAMPIN <=0.5 SENSITIVE Sensitive     Inducible Clindamycin NEGATIVE Sensitive     * STAPHYLOCOCCUS CAPITIS   Staphylococcus epidermidis - MIC*    CIPROFLOXACIN >=8 RESISTANT Resistant     ERYTHROMYCIN >=8 RESISTANT Resistant     GENTAMICIN <=0.5 SENSITIVE Sensitive     OXACILLIN <=0.25 SENSITIVE Sensitive     TETRACYCLINE <=1 SENSITIVE Sensitive     VANCOMYCIN 1 SENSITIVE Sensitive     TRIMETH/SULFA <=10 SENSITIVE Sensitive     CLINDAMYCIN <=0.25 SENSITIVE Sensitive     RIFAMPIN <=0.5 SENSITIVE Sensitive     Inducible Clindamycin NEGATIVE Sensitive     * STAPHYLOCOCCUS EPIDERMIDIS   Culture, blood (Routine X 2) w Reflex to ID Panel     Status: Abnormal   Collection Time: 08/27/21  5:16 PM   Specimen: BLOOD  Result Value Ref Range Status   Specimen Description   Final    BLOOD BLOOD RIGHT HAND Performed at [facility], [address] [place], [place] [address]    Special Requests   Final    IN PEDIATRIC BOTTLE Blood Culture adequate  volume Performed at [facility], [address], [place], [place] [address]    Culture  Setup Time   Final    GRAM POSITIVE COCCI IN CLUSTERS IN PEDIATRIC BOTTLE CRITICAL VALUE NOTED.  VALUE IS CONSISTENT WITH PREVIOUSLY REPORTED AND CALLED VALUE.    Culture (A)  Final    STAPHYLOCOCCUS EPIDERMIDIS STAPHYLOCOCCUS CAPITIS SUSCEPTIBILITIES PERFORMED ON PREVIOUS CULTURE WITHIN THE LAST 5 DAYS. Performed at [facility], [address] [place], [place] [address]    Report Status 08/31/2021 FINAL  Final  Blood Culture ID Panel (Reflexed)     Status: Abnormal   Collection Time: 08/27/21  5:16 PM  Result Value Ref Range Status   Enterococcus faecalis NOT DETECTED NOT DETECTED Final   Enterococcus Faecium NOT DETECTED NOT DETECTED Final   Listeria monocytogenes NOT DETECTED NOT DETECTED Final   Staphylococcus species DETECTED (A) NOT DETECTED Final    Comment: CRITICAL RESULT CALLED TO, READ BACK BY AND VERIFIED WITH: PHARMD Lana Fish [id number] AT 1435 BY CM    Staphylococcus aureus (BCID) NOT DETECTED NOT DETECTED Final   Staphylococcus epidermidis DETECTED (A) NOT DETECTED Final    Comment: CRITICAL RESULT CALLED TO, READ BACK BY AND VERIFIED WITH: PHARMD Lana Fish [id number] AT 1435 BY CM    Staphylococcus lugdunensis NOT DETECTED NOT DETECTED Final   Streptococcus species NOT DETECTED NOT DETECTED Final   Streptococcus agalactiae NOT DETECTED NOT DETECTED Final   Streptococcus pneumoniae NOT DETECTED NOT DETECTED Final   Streptococcus pyogenes NOT DETECTED NOT DETECTED Final    A.calcoaceticus-baumannii NOT DETECTED NOT DETECTED Final   Bacteroides fragilis NOT DETECTED NOT DETECTED Final   Enterobacterales NOT DETECTED NOT DETECTED Final   Enterobacter cloacae complex NOT DETECTED NOT DETECTED Final   Escherichia coli NOT DETECTED NOT DETECTED Final   Klebsiella aerogenes NOT DETECTED NOT DETECTED Final   Klebsiella oxytoca NOT DETECTED NOT DETECTED Final   Klebsiella pneumoniae NOT DETECTED NOT DETECTED Final   Proteus species NOT DETECTED NOT DETECTED Final   Salmonella species NOT DETECTED NOT DETECTED Final   Serratia marcescens NOT DETECTED NOT DETECTED Final   Haemophilus influenzae NOT DETECTED NOT DETECTED Final   Neisseria meningitidis NOT DETECTED NOT DETECTED Final   Pseudomonas aeruginosa NOT DETECTED NOT DETECTED Final   Stenotrophomonas maltophilia NOT DETECTED NOT DETECTED Final   Candida albicans NOT DETECTED NOT DETECTED Final   Candida auris NOT DETECTED NOT DETECTED Final   Candida glabrata NOT DETECTED NOT DETECTED Final   Candida krusei NOT DETECTED NOT DETECTED Final   Candida parapsilosis NOT DETECTED NOT DETECTED Final   Candida tropicalis NOT DETECTED NOT DETECTED Final   Cryptococcus neoformans/gattii NOT DETECTED NOT DETECTED Final   Methicillin resistance mecA/C NOT DETECTED NOT DETECTED Final    Comment: Performed at [facility], [address] [place], [place] [address]  Respiratory (~20 pathogens) panel by PCR     Status: None   Collection Time: 08/27/21  5:18 PM   Specimen: Nasopharyngeal Swab; Respiratory  Result Value Ref Range Status   Adenovirus NOT DETECTED NOT DETECTED Final   Coronavirus 229E NOT DETECTED NOT DETECTED Final    Comment: (NOTE) The Coronavirus on the Respiratory Panel, DOES NOT test for the novel  Coronavirus (2019 nCoV)    Coronavirus HKU1 NOT DETECTED NOT DETECTED Final   Coronavirus NL63 NOT DETECTED NOT DETECTED Final   Coronavirus OC43 NOT DETECTED NOT DETECTED Final   Metapneumovirus  NOT DETECTED NOT DETECTED Final  Rhinovirus / Enterovirus NOT DETECTED NOT DETECTED Final   Influenza A NOT DETECTED NOT DETECTED Final   Influenza B NOT DETECTED NOT DETECTED Final   Parainfluenza Virus 1 NOT DETECTED NOT DETECTED Final   Parainfluenza Virus 2 NOT DETECTED NOT DETECTED Final   Parainfluenza Virus 3 NOT DETECTED NOT DETECTED Final   Parainfluenza Virus 4 NOT DETECTED NOT DETECTED Final   Respiratory Syncytial Virus NOT DETECTED NOT DETECTED Final   Bordetella pertussis NOT DETECTED NOT DETECTED Final   Bordetella Parapertussis NOT DETECTED NOT DETECTED Final   Chlamydophila pneumoniae NOT DETECTED NOT DETECTED Final   Mycoplasma pneumoniae NOT DETECTED NOT DETECTED Final    Comment: Performed at [facility], [address] [place], [place] [address]  Urine Culture     Status: Abnormal   Collection Time: 08/27/21  6:25 PM   Specimen: Urine, Catheterized  Result Value Ref Range Status   Specimen Description   Final    URINE, CATHETERIZED Performed at [facility], [address], [place], [place] [address]    Special Requests   Final    NONE Performed at [facility] [address] [place], [place] [address]    Culture (A)  Final    >=100,000 COLONIES/mL KLEBSIELLA PNEUMONIAE Two isolates with different morphologies were identified as the same organism.The most resistant organism was reported. Performed at [facility], [address], [place], [place] [address]    Report Status 08/30/2021 FINAL  Final   Organism ID, Bacteria KLEBSIELLA PNEUMONIAE (A)  Final      Susceptibility   Klebsiella pneumoniae - MIC*    AMPICILLIN RESISTANT Resistant     CEFAZOLIN <=4 SENSITIVE Sensitive     CEFEPIME <=0.12 SENSITIVE Sensitive     CEFTRIAXONE <=0.25 SENSITIVE Sensitive     CIPROFLOXACIN <=0.25 SENSITIVE Sensitive     GENTAMICIN <=1 SENSITIVE Sensitive     IMIPENEM <=0.25 SENSITIVE Sensitive     NITROFURANTOIN 128  RESISTANT Resistant     TRIMETH/SULFA <=20 SENSITIVE Sensitive     AMPICILLIN/SULBACTAM 4 SENSITIVE Sensitive     PIP/TAZO <=4 SENSITIVE Sensitive     * >=100,000 COLONIES/mL KLEBSIELLA PNEUMONIAE  Culture, blood (Routine X 2) w Reflex to ID Panel     Status: None (Preliminary result)   Collection Time: 08/28/21  8:34 AM   Specimen: BLOOD  Result Value Ref Range Status   Specimen Description   Final    BLOOD BLOOD LEFT HAND Performed at [facility], [address] [place], [place] [address]    Special Requests   Final    BOTTLES DRAWN AEROBIC ONLY Blood Culture adequate volume Performed at [facility], [address], [place], [place] [address]    Culture   Final    NO GROWTH 3 DAYS Performed at [facility], [address] [place], [place] [address]    Report Status PENDING  Incomplete  Culture, blood (Routine X 2) w Reflex to ID Panel     Status: None (Preliminary result)   Collection Time: 08/28/21  8:34 AM   Specimen: BLOOD  Result Value Ref Range Status   Specimen Description   Final    BLOOD BLOOD RIGHT HAND Performed at [facility], [address], [place], [place] [address]    Special Requests   Final    BOTTLES DRAWN AEROBIC ONLY Blood Culture adequate volume Performed at [facility], [address] [place], [place] [address]    Culture   Final    NO GROWTH 3 DAYS Performed at [facility], [address], [place], [place] [address]    Report Status PENDING  Incomplete  MRSA Next Gen by PCR, Nasal     Status: None   Collection Time: 08/29/21  9:55 PM   Specimen: Nasal Mucosa; Nasal Swab  Result Value Ref Range Status   MRSA by PCR Next Gen NOT DETECTED NOT DETECTED Final    Comment: (NOTE) The GeneXpert MRSA Assay (FDA approved for NASAL specimens only), is one component of a comprehensive MRSA colonization surveillance program. It is not intended to diagnose MRSA  infection nor to guide or monitor treatment for MRSA infections. Test performance is not FDA approved in patients less than 85 years old. Performed at [facility], [address], [place], [place] [address]   Surgical PCR screen     Status: None   Collection Time: 08/30/21  4:11 AM   Specimen: Nasal Mucosa; Nasal Swab  Result Value Ref Range Status   MRSA, PCR NEGATIVE NEGATIVE Final   Staphylococcus aureus NEGATIVE NEGATIVE Final    Comment: (NOTE) The Xpert SA Assay (FDA approved for NASAL specimens in patients 11 years of age and older), is one component of a comprehensive surveillance program. It is not intended to diagnose infection nor to guide or monitor treatment. Performed at [facility], [address], [place], [place] [address]     Rexene Alberts, MSN, NP-C [facility]  [email or web address] Pager: [phone] Office: [phone] [facility] Main Line: [phone] *Secure Chat Communication Welcome

## 2021-09-01 ENCOUNTER — Inpatient Hospital Stay (HOSPITAL_COMMUNITY): Payer: Medicare HMO

## 2021-09-01 ENCOUNTER — Encounter (HOSPITAL_COMMUNITY): Payer: Self-pay | Admitting: Internal Medicine

## 2021-09-01 DIAGNOSIS — N179 Acute kidney failure, unspecified: Secondary | ICD-10-CM | POA: Diagnosis not present

## 2021-09-01 DIAGNOSIS — E875 Hyperkalemia: Secondary | ICD-10-CM | POA: Diagnosis not present

## 2021-09-01 DIAGNOSIS — N1832 Chronic kidney disease, stage 3b: Secondary | ICD-10-CM | POA: Diagnosis not present

## 2021-09-01 DIAGNOSIS — I10 Essential (primary) hypertension: Secondary | ICD-10-CM | POA: Diagnosis not present

## 2021-09-01 DIAGNOSIS — J9601 Acute respiratory failure with hypoxia: Secondary | ICD-10-CM | POA: Diagnosis not present

## 2021-09-01 DIAGNOSIS — I5032 Chronic diastolic (congestive) heart failure: Secondary | ICD-10-CM | POA: Diagnosis not present

## 2021-09-01 DIAGNOSIS — I442 Atrioventricular block, complete: Secondary | ICD-10-CM | POA: Diagnosis not present

## 2021-09-01 DIAGNOSIS — N19 Unspecified kidney failure: Secondary | ICD-10-CM | POA: Diagnosis not present

## 2021-09-01 LAB — CBC WITH DIFFERENTIAL/PLATELET
Abs Immature Granulocytes: 0.18 10*3/uL — ABNORMAL HIGH (ref 0.00–0.07)
Basophils Absolute: 0 10*3/uL (ref 0.0–0.1)
Basophils Relative: 0 %
Eosinophils Absolute: 0.2 10*3/uL (ref 0.0–0.5)
Eosinophils Relative: 2 %
HCT: 25.7 % — ABNORMAL LOW (ref 36.0–46.0)
Hemoglobin: 7.8 g/dL — ABNORMAL LOW (ref 12.0–15.0)
Immature Granulocytes: 2 %
Lymphocytes Relative: 26 %
Lymphs Abs: 2.1 10*3/uL (ref 0.7–4.0)
MCH: 26.2 pg (ref 26.0–34.0)
MCHC: 30.4 g/dL (ref 30.0–36.0)
MCV: 86.2 fL (ref 80.0–100.0)
Monocytes Absolute: 0.6 10*3/uL (ref 0.1–1.0)
Monocytes Relative: 7 %
Neutro Abs: 5.1 10*3/uL (ref 1.7–7.7)
Neutrophils Relative %: 63 %
Platelets: 232 10*3/uL (ref 150–400)
RBC: 2.98 MIL/uL — ABNORMAL LOW (ref 3.87–5.11)
RDW: 15.6 % — ABNORMAL HIGH (ref 11.5–15.5)
WBC: 8.1 10*3/uL (ref 4.0–10.5)
nRBC: 0 % (ref 0.0–0.2)

## 2021-09-01 LAB — GLUCOSE, CAPILLARY
Glucose-Capillary: 152 mg/dL — ABNORMAL HIGH (ref 70–99)
Glucose-Capillary: 179 mg/dL — ABNORMAL HIGH (ref 70–99)
Glucose-Capillary: 127 mg/dL — ABNORMAL HIGH (ref 70–99)
Glucose-Capillary: 196 mg/dL — ABNORMAL HIGH (ref 70–99)
Glucose-Capillary: 179 mg/dL — ABNORMAL HIGH (ref 70–99)
Glucose-Capillary: 148 mg/dL — ABNORMAL HIGH (ref 70–99)

## 2021-09-01 LAB — COMPREHENSIVE METABOLIC PANEL
ALT: 8 U/L (ref 0–44)
AST: 18 U/L (ref 15–41)
Albumin: 2.6 g/dL — ABNORMAL LOW (ref 3.5–5.0)
Alkaline Phosphatase: 44 U/L (ref 38–126)
Anion gap: 7 (ref 5–15)
BUN: 31 mg/dL — ABNORMAL HIGH (ref 8–23)
CO2: 26 mmol/L (ref 22–32)
Calcium: 8.2 mg/dL — ABNORMAL LOW (ref 8.9–10.3)
Chloride: 105 mmol/L (ref 98–111)
Creatinine, Ser: 1.34 mg/dL — ABNORMAL HIGH (ref 0.44–1.00)
GFR, Estimated: 40 mL/min — ABNORMAL LOW (ref >60)
Glucose, Bld: 157 mg/dL — ABNORMAL HIGH (ref 70–99)
Potassium: 4.6 mmol/L (ref 3.5–5.1)
Sodium: 138 mmol/L (ref 135–145)
Total Bilirubin: 0.4 mg/dL (ref 0.3–1.2)
Total Protein: 5.8 g/dL — ABNORMAL LOW (ref 6.5–8.1)

## 2021-09-01 MED ORDER — AMLODIPINE BESYLATE 10 MG PO TABS
10.0000 mg | ORAL_TABLET | Freq: Every day | ORAL | Status: DC
  Administered 2021-09-01 – 2021-09-03 (×3): 10 mg via ORAL
  Filled 2021-09-01 (×3): qty 1

## 2021-09-01 MED FILL — Gentamicin Sulfate Inj 40 MG/ML: INTRAMUSCULAR | Qty: 80 | Status: AC

## 2021-09-01 NOTE — Progress Notes (Signed)
Progress Note  Patient Name: Virginia Summers Date of Encounter: 09/01/2021  [facility] Cardiologist: Kristeen Miss, MD   Subjective   Doing well post pacer.   Inpatient Medications    Scheduled Meds:  Chlorhexidine Gluconate Cloth  6 each Topical Daily   DULoxetine  60 mg Oral QHS   hydrochlorothiazide  25 mg Oral Daily   insulin aspart  0-6 Units Subcutaneous Q4H   levothyroxine  25 mcg Oral QAC breakfast   mometasone-formoterol  2 puff Inhalation BID   pantoprazole (PROTONIX) IV  40 mg Intravenous QHS   sodium chloride flush  10-40 mL Intracatheter Q12H   sodium chloride flush  3 mL Intravenous Q12H   Continuous Infusions:   ceFAZolin (ANCEF) IV 2 g (09/01/21 0506)   PRN Meds: acetaminophen, docusate sodium, hydrALAZINE, magic mouthwash w/lidocaine, ondansetron (ZOFRAN) IV, mouth rinse, oxyCODONE, polyethylene glycol   Vital Signs    Vitals:   09/01/21 0630 09/01/21 0700 09/01/21 0730 09/01/21 0741  BP:  (!) 162/47    Pulse: 62 60 60 60  Resp: 15 (!) 23 16 17   Temp:  98.1 F (36.7 C)    TempSrc:  Oral    SpO2: 96% 97% 96% 96%  Weight:      Height:        Intake/Output Summary (Last 24 hours) at 09/01/2021 0850 Last data filed at 09/01/2021 0539 Gross per 24 hour  Intake 818.21 ml  Output 1045 ml  Net -226.79 ml      09/01/2021    5:00 AM 08/30/2021    4:00 AM 08/29/2021    2:16 AM  Last 3 Weights  Weight (lbs) 185 lb 13.6 oz 189 lb 2.5 oz 190 lb 0.6 oz  Weight (kg) 84.3 kg 85.8 kg 86.2 kg      Telemetry    A paced 60- Personally Reviewed  ECG    A paced LBBB - Personally Reviewed  Physical Exam   GEN: No acute distress.   Neck: No JVD Cardiac: RRR, no murmurs, rubs, or gallops. Pacer dsg Respiratory: Clear to auscultation bilaterally. GI: Soft, nontender, non-distended  MS: No edema; No deformity. Neuro:  Nonfocal  Psych: Normal affect   Labs    High Sensitivity Troponin:   Recent Labs  Lab 08/27/21 1425 08/27/21 1716  08/29/21 0354  TROPONINIHS 14 24* 37*     Chemistry Recent Labs  Lab 08/29/21 0330 08/29/21 1839 08/30/21 0350 08/31/21 0535 09/01/21 0056  NA 137   < > 140 140 138  K 4.4   < > 4.5 4.9 4.6  CL 104   < > 104 107 105  CO2 28   < > 29 29 26   GLUCOSE 145*   < > 105* 163* 157*  BUN 24*   < > 38* 29* 31*  CREATININE 1.57*   < > 1.79* 1.26* 1.34*  CALCIUM 8.0*   < > 8.5* 8.2* 8.2*  MG 2.6*  --  2.7* 2.4  --   PROT 6.5  --  6.4*  --  5.8*  ALBUMIN 2.8*   < > 2.9* 2.4* 2.6*  AST 27  --  17  --  18  ALT 22  --  15  --  8  ALKPHOS 44  --  44  --  44  BILITOT 0.3  --  0.3  --  0.4  GFRNONAA 33*   < > 28* 43* 40*  ANIONGAP 5   < > 7 4* 7   < > =  values in this interval not displayed.    Lipids No results for input(s): "CHOL", "TRIG", "HDL", "LABVLDL", "LDLCALC", "CHOLHDL" in the last 168 hours.  Hematology Recent Labs  Lab 08/30/21 0350 08/31/21 0535 09/01/21 0056  WBC 7.4 7.5 8.1  RBC 3.32* 2.91* 2.98*  HGB 8.4* 7.6* 7.8*  HCT 28.6* 25.2* 25.7*  MCV 86.1 86.6 86.2  MCH 25.3* 26.1 26.2  MCHC 29.4* 30.2 30.4  RDW 15.8* 15.6* 15.6*  PLT 245 215 232   Thyroid  Recent Labs  Lab 08/28/21 0256  TSH 0.613  FREET4 1.04    BNP Recent Labs  Lab 08/27/21 1425  BNP 1,166.7*    DDimer  Recent Labs  Lab 08/27/21 1427  DDIMER 1.10*     Radiology    DG Chest 2 View  Result Date: 09/01/2021 CLINICAL DATA:  Pacemaker EXAM: CHEST - 2 VIEW COMPARISON:  08/28/2021 FINDINGS: Interval placement of left chest multi lead pacer, leads projecting over the vicinity of the right atrium and right ventricular apex. Cardiomegaly. Mild, diffuse bilateral interstitial pulmonary opacity. Disc degenerative disease of the thoracic spine. IMPRESSION: 1. Interval placement of left chest multi lead pacer, leads projecting over the vicinity of the right atrium and right ventricular apex. 2. Cardiomegaly. Mild, diffuse bilateral interstitial pulmonary opacity, likely edema. Electronically Signed    By: Jearld Lesch M.D.   On: 09/01/2021 08:20   EP PPM/ICD IMPLANT  Result Date: 08/31/2021 CONCLUSIONS:  1. Successful implantation of a [employer] dual-chamber pacemaker for symptomatic bradycardia due to complete heart block  2. No early apparent complications.       Lewayne Bunting, MD 08/31/2021 5:37 PM    Cardiac Studies   EF 50 to 55% with mild left atrial enlargement and trivial mitral regurgitation  Patient Profile     81 y.o. female with [facility] Jude pacemaker placement on 08/31/2021 by Dr. Ladona Ridgel secondary to complete heart block with history of extensive bilateral pulmonary embolism chronic anemia chronic diastolic heart failure and acute kidney injury  Assessment & Plan    Complete heart block status post [facility] pacemaker Dr. Ladona Ridgel. -A pacing noted.  Left bundle branch block - Underlying no change  Chronic diastolic heart failure - Stable, euvolemic.  Prior CRRT.  Catheter removed.  Acute kidney injury - Resolved.  Appreciate nephrology's assistance.  Hyperkalemia - Initial potassium 7.5.  Improved.  Infectious disease - Contaminant blood culture originally.  Repeat blood culture no growth.  Appreciate ID consultation.  Anemia of chronic disease - Baseline hemoglobin 9, currently 7.8-stable, no signs of bleeding.  Work-up per primary team/outpatient work-up.  Chronic kidney disease stage IIIb - Creatinine currently 1.36.  Stable.   For questions or updates, please contact [facility] Please consult [email or web address] for contact info under        Signed, Donato Schultz, MD  09/01/2021, 8:50 AM

## 2021-09-01 NOTE — Evaluation (Signed)
Physical Therapy Evaluation Patient Details Name: Virginia Summers MRN: [id number] DOB: [birth year] Today's Date: 09/01/2021  History of Present Illness  Pt is an 81 y.o. female admitted to [facility] 08/27/21 with worsening respiratory distress requiring BiPAP. Workup for AKI, hyperkalemia, complete heart block. CRRT initiated 6/16-6/18. Transfer to [facility] 6/19. S/p PPM placemaner 6/20. S/p temp HD cath removal and transfer out of ICU 6/21. PMH includes DM, HTN, DVT/PE, COVID (2022), back sx (2000).   Clinical Impression  Pt presents with an overall decrease in functional mobility secondary to above. PTA, pt independent without DME, lives with daughter for increased support since last hospital admission. Educ re: pacemaker precautions, positioning, activity recommendations and importance of mobility. Today, pt ambulatory with intermittent minA for stability; pt endorses BLE fatigue and weakness. Pt would benefit from continued acute PT services to maximize functional mobility and independence prior to d/c with HHPT services.      Recommendations for follow up therapy are one component of a multi-disciplinary discharge planning process, led by the attending physician.  Recommendations may be updated based on patient status, additional functional criteria and insurance authorization.  Follow Up Recommendations Home health PT      Assistance Recommended at Discharge Intermittent Supervision/Assistance  Patient can return home with the following  A little help with walking and/or transfers;A little help with bathing/dressing/bathroom;Assistance with cooking/housework;Assist for transportation;Help with stairs or ramp for entrance    Equipment Recommendations None recommended by PT  Recommendations for Other Services       Functional Status Assessment Patient has had a recent decline in their functional status and demonstrates the ability to make significant improvements in function in a reasonable and  predictable amount of time.     Precautions / Restrictions Precautions Precautions: Fall;ICD/Pacemaker Restrictions Weight Bearing Restrictions: No      Mobility  Bed Mobility               General bed mobility comments: received sitting in recliner    Transfers Overall transfer level: Needs assistance Equipment used: 1 person hand held assist Transfers: Sit to/from Stand Sit to Stand: Min assist           General transfer comment: good awareness of LUE pacemaker precautions in scooting to edge of recliner to stand, minA for RUE HHA to elevate trunk and stabilize    Ambulation/Gait Ambulation/Gait assistance: Min assist Gait Distance (Feet): 90 Feet Assistive device: 1 person hand held assist Gait Pattern/deviations: Step-through pattern, Decreased stride length, Trunk flexed Gait velocity: Decreased     General Gait Details: slow, mildly unsteady gait reliant on minA for HHA to stabilize; pt endorses BLE fatigue and weakness limiting further ambulation distance  Stairs            Wheelchair Mobility    Modified Rankin (Stroke Patients Only)       Balance Overall balance assessment: Needs assistance Sitting-balance support: No upper extremity supported Sitting balance-Leahy Scale: Fair     Standing balance support: No upper extremity supported, During functional activity Standing balance-Leahy Scale: Fair Standing balance comment: can static stand without UE support; static and dynamic stability improved with HHA                             Pertinent Vitals/Pain Pain Assessment Pain Assessment: Faces Faces Pain Scale: Hurts little more Pain Location: pacemaker insertion site Pain Descriptors / Indicators: Discomfort, Grimacing, Guarding Pain Intervention(s): Monitored during session, Limited activity within  patient's tolerance    Home Living Family/patient expects to be discharged to:: Private residence Living Arrangements:  Children Available Help at Discharge: Family;Available PRN/intermittently Type of Home: House Home Access: Stairs to enter;Ramped entrance       Home Layout: One level Home Equipment: Agricultural consultant (2 wheels);Cane - single point;Shower seat Additional Comments: Has moved in with daughter within the past year for increased assist    Prior Function Prior Level of Function : Needs assist             Mobility Comments: Typically ambulatory without DME; reports BLE weakness and numbness since COVID last year (admitted 11/2020 for this) ADLs Comments: Does bird baths at sink without assist     Hand Dominance   Dominant Hand: Right    Extremity/Trunk Assessment                Communication   Communication: HOH  Cognition Arousal/Alertness: Awake/alert Behavior During Therapy: WFL for tasks assessed/performed Overall Cognitive Status: Within Functional Limits for tasks assessed                                 General Comments: WFL for simple tasks, not formally assessed; some repetitiveness in conversation; suspect some decreased awareness regarding medical status        General Comments General comments (skin integrity, edema, etc.): SpO2 down to 86% on RA with ambulation, 2L O2 [place] replaced. educ on pacemaker precautions; encouraged L elbow flex/ext as pt holding LUE in guarded position due to discomfort at insertion site    Exercises General Exercises - Lower Extremity Ankle Circles/Pumps: AROM, Both, Seated Long Arc Quad: AROM, Both, Seated Hip Flexion/Marching: AROM, Both, Seated   Assessment/Plan    PT Assessment Patient needs continued PT services  PT Problem List Decreased strength;Decreased activity tolerance;Decreased balance;Decreased mobility;Decreased knowledge of use of DME;Decreased knowledge of precautions;Cardiopulmonary status limiting activity;Pain       PT Treatment Interventions DME instruction;Gait training;Stair  training;Functional mobility training;Therapeutic activities;Therapeutic exercise;Balance training;Patient/family education    PT Goals (Current goals can be found in the Care Plan section)  Acute Rehab PT Goals Patient Stated Goal: return home, regain strength PT Goal Formulation: With patient Time For Goal Achievement: 09/15/21 Potential to Achieve Goals: Good    Frequency Min 3X/week     Co-evaluation               AM-PAC PT "6 Clicks" Mobility  Outcome Measure Help needed turning from your back to your side while in a flat bed without using bedrails?: A Little Help needed moving from lying on your back to sitting on the side of a flat bed without using bedrails?: A Little Help needed moving to and from a bed to a chair (including a wheelchair)?: A Little Help needed standing up from a chair using your arms (e.g., wheelchair or bedside chair)?: A Little Help needed to walk in hospital room?: A Little Help needed climbing 3-5 steps with a railing? : A Little 6 Click Score: 18    End of Session Equipment Utilized During Treatment: Gait belt Activity Tolerance: Patient tolerated treatment well;Patient limited by fatigue Patient left: in chair;with call bell/phone within reach Nurse Communication: Mobility status PT Visit Diagnosis: Other abnormalities of gait and mobility (R26.89);Muscle weakness (generalized) (M62.81)    Time: 6962-9528 PT Time Calculation (min) (ACUTE ONLY): 21 min   Charges:   PT Evaluation $PT Eval Moderate Complexity: 1  Mod        Ina Homes, PT, DPT Acute Rehabilitation Services  Pager [phone] Office [phone]  Malachy Chamber 09/01/2021, 1:51 PM

## 2021-09-01 NOTE — Progress Notes (Signed)
Pt not wanting to wear CPAP for the night. °

## 2021-09-01 NOTE — Progress Notes (Signed)
NAME:  Virginia Summers, MRN:  [id number], DOB:  [birth year], LOS: 5 ADMISSION DATE:  08/27/2021, CONSULTATION DATE:  08/27/2021  REFERRING MD:  Dr Bruce Donath of ER, CHIEF COMPLAINT:  Hyperkalemia., complete heart block AKI, resp failure  Cardiologist Dr. Cheral Bay Nahser  BRIEF  81 year old female who is bedbound at baseline secondary to back pain. Admitted 6/16 with AKI, hyperkalemia, and CHB. CRRT x 2 days. Continued to have CHB requiring intermittent transcutaneous pacing. Transferred to [facility] 6/19 for cardiology evaluation.   Past Medical History:   has a past medical history of Anticoagulant long-term use, Chest pain, Diabetes mellitus, Diverticulitis, DVT (deep venous thrombosis) (HCC), GERD (gastroesophageal reflux disease), HTN (hypertension), Hypothyroidism, and Pulmonary embolism (HCC).   has a past surgical history that includes Back surgery (03/14/1998); Partial hysterectomy (AGE 90's APPROX.); Appendectomy (AGE 79); Shoulder surgery (5-6 YRS OLD); Secondary intraocular lens implantation (6010,9323); PACEMAKER IMPLANT (08/31/2021); and PACEMAKER IMPLANT (N/A, 08/31/2021).  Significant Hospital Events:  08/27/2021 - admit. [facility] ICU. Renal consult, cards, consult., CCM admit. S HD PLACED, Start CRRT., On BiPAP. Externally paced  6/17 - CRRT continues.  Negative balance over 100 cc/h and tolerating it well.  Blood pressure still high.  Not making urine.  Still being externally paced and pacer dependent.  Dr. Rennis Golden cardiology at the bedside and feels patient has intrinsic conduction disease but wants to wait another 24 hours before making a determination.  Respiratory status much improved on BiPAP.  40% FiO2.  - Turned pacer off ad HR dropped to 30s and pacer turned back on  6/18 off CRRT. Blood cx positive for staph epi and staph captitis. Back on TCP overnight.   6/19 transferred to [facility] for PPM placement; remains off TCP, HR in 40s with stable BP.  Dopamine  titrated off.   6/20 PPM placed  6/21 tx out of ICU, temp HD cath removed  Interim History / Subjective:   No events overnight  Objective   Blood pressure (!) 148/37, pulse 60, temperature 98.2 F (36.8 C), temperature source Oral, resp. rate 14, height 5' (1.524 m), weight 84.3 kg, SpO2 97 %.    FiO2 (%):  [28 %] 28 %   Intake/Output Summary (Last 24 hours) at 09/01/2021 1237 Last data filed at 09/01/2021 0539 Gross per 24 hour  Intake 610.73 ml  Output 855 ml  Net -244.27 ml   Filed Weights   08/29/21 0216 08/30/21 0400 09/01/21 0500  Weight: 86.2 kg 85.8 kg 84.3 kg    Examination: General:  Frail elderly female sitting in bedside recliner in NAD HEENT: MM pink/moist Neuro: Alert, appropriate, MAE- generalized weakness CV: paced, left PPM site dressing cdi PULM:  non labored, clear anteriorly, few crackles in left base- diminished but shallow, suspect atelectasis, on room air tolerating  GI: obese, +bs, NT Extremities: warm/dry, no LE edema  Skin: no rashes   Afebrile UOP 1.1L/ 24hrs  Net -4.1L  Wts 85.8> 84.3kg  Labs reviewed>  sCr 1.79> 1.34 PCT yest > neg Hgb stable   Resolved Hospital Problem list     Assessment & Plan:  Intermittent complete heart block with symptomatic bradycardia - s/p PPM 6/20 - per cardiology and EP - remains paced and hemodynamically stable   Bacteremia versus probably contamination - Blood cultures from two separate sites both growing staph epi and staph capitis - She remained afebrile, white count remained stable - repeat BC from 6/17 remain NGTD -  s/p dapto 6/19 x1,  ID following, appreciate input  Acute UTI with Klebsiella - Continue cefazolin to complete 5 days therapy  AKI on CKD stage 3a likely in the setting of hypertensive crisis, metformin, meloxicam + dehydration. Complicated by life threatening hyperkalemia s/p CRRT 6/16 > 6/18.   - renal function continues to improve, UOP stable - nephrology signed off 6/20 -  will d/c temporary HD catheter today - trend renal indices and urine output - Avoid nephrotoxic agent - Monitor intake and output  Acute on chronic hypoxemic respiratory failure> improved with volume removal. On 3 L nocturnal oxygen at home.  Possibly undiagnosed OSA - Currently on baseline q HS home oxygen therapy - CPAP q HS as tolerated/ encouraged  - PT ordered, encourage IS and pulmonary hygiene  - cont dulera   Hypertensive crisis improved with history of severe uncontrolled hypertension with previous admissions for hypertensive emergency  On Diovan, hydralazine and amlodipine at home - continue norvasc, HCTZ, prn hydralazine  Chronic HFpEF - closely monitor fluid balance, euvolemic   History of extensive bilateral pulmonary embolism 05/26/2010 (negative evaluation CT February 2021 and September 2022] -on permanent anticoagulation with currently on proph dose Eliquis  - resume eliquis when ok with EP s/p PPM  - VTE ppx dosing heparin for now  Diabetes type 2 Continue sliding scale with CBG goal 140-180 Hemoglobin A1c 7.6 - encourage carb modified diet but patient has many sugary treats at bedside.  Cont SSI   Hyperlipidemia -on fenofibrate, Lipitor Hypothyroidism  on Synthroid (TSH normal this admission]  Chronic back pain, reported bed bound at home - avoid meloxicam given AKI - continue tylenol, prn oxy IR, Cymbalta   Best practice (daily eval):  Diet: carb/ heart healthy Pain/Anxiety/Delirium protocol (if indicated): none VAP protocol (if indicated): n/a DVT prophylaxis: Prophylaxis dose of Eliquis at home ->  Heparin DVT prophylaxis -> start po eliquis low dose when appropriate GI prophylaxis: PPI Glucose control: SSI Mobility:  progressive/ PT Code Status: full   Patient hemodynamically stable and ready for transfer to telemetry.  Will transfer to [facility] as of 6/22 and PCCM sign off, available as needed.   CCT: n/a    Posey Boyer, ACNP [facility] 09/01/2021, 12:37 PM  See Amion for pager If no response to pager, please call PCCM consult pager After 7:00 pm call Elink

## 2021-09-01 NOTE — Progress Notes (Signed)
08/31/21 1530  Clinical Encounter Type  Visited With Patient and family together (Daughters: Ms. Christena Deem and Ms. Edilia Bo)  Visit Type Initial (Advance Directive Education)  Referral From Nurse  Consult/Referral To Chaplain Gelene Mink [place])   Chaplain responded to spiritual care page requesting assistance for Advance Directive preparation with Virginia Summers. Met with Virginia Summers and her two daughters, Ms. Christena Deem and Ms. Edilia Bo, at patient's bedside.   Virginia Summers indicated that she intends to list her daughters as her Health Care Power of [address].   Chaplain provided the Advance Directive packet as well as education on Advance Directives-documents an individual completes to communicate their health care directions in advance of a time when they may need them. Chaplain informed Virginia Summers the documents which may be completed here in the hospital are the Living Will and Health Care Power of [place].   Chaplain informed patient that the Health Care Power of [place] is a legal document in which an individual names another person, as their Health Care Agent, to make health care decisions when the individual is not able to make them for themselves. The Health Care Agent's function can be temporary or permanent depending on her ability to make and communicate those decisions independently.   Chaplain informed Virginia Summers in the absence of a Health Care Power of [place], the state of [place] directs health care providers to look to the following individuals in the order listed: legal guardian; an attorney-in-fact under a general power of attorney (POA) if that POA includes the right to make health care decisions; person's spouse; a majority of her children; a majority of adult brothers and sisters; or an individual who has an established relationship with you, who is acting in good faith and who can convey your wishes.  If none of these persons are available or willing to make  medical decisions on a patient's behalf, the law allows the patient's doctor to make decisions for them as long as another doctor agrees with those decisions.  Chaplain also informed the patient that the Health Care agent has no decision-making authority over any affairs other than those related to her medical care.   The chaplain further educated Virginia Summers that a Living Will is a legal document that allows her desires not to receive life-prolonging measures in the event that she has a condition that is incurable and will result in her death in a short period of time; they are unconscious, and doctors are confident that she will not regain consciousness; and/or she has advanced dementia or other substantial and irreversible loss of mental function.   The chaplain informed Virginia Summers that life-prolonging measures are medical treatments that would only serve to postpone death, including breathing machines, kidney dialysis, antibiotics, artificial nutrition and hydration (tube feeding), and similar forms of treatment and that if an individual is able to express their wishes, they may also make them known without the use of a Living Will, but in the event that she is not able to express her wishes, a Living Will allows medical providers and the her family and friends to ensure that they are not making decisions on the her behalf, but rather serving as the her voice to convey decisions that she has already made.   Virginia Summers is aware that the decision to create an advance directive is hers alone and she may choose not to complete the documents or may choose to complete one portion or both.  Virginia Summers was informed that she can revoke the documents at any time by striking through them and writing void or by completing new documents, but that it is also advisable that the individual verbally notify interested parties that her wishes have changed.   Virginia Summers is also aware that the document must be signed in the presence of  a notary public and two witnesses and that this can be done while the patient is still admitted to the hospital or after discharge in the community. If they decide to complete Advance Directives after being discharged from the hospital, they have been advised to notify all interested parties and to provide those documents to their physicians and loved ones in addition to bringing them to the hospital in the event of another hospitalization.   The chaplain informed the Virginia Summers that if she desires to proceed with completing Advance Directive Documentation while she is still admitted, notary services are typically available at [facility] between the hours of 1:00 and 3:30 Monday-Thursday.   When the patient is ready to have these documents completed, the patient should request that their nurse place a spiritual care consult and indicate that the patient is ready to have their advance directives notarized so that arrangements for witnesses and notary public can be made.  If there is an immediate need to have notarized please page the Chaplain.   Please page spiritual care if the patient desires further education or has questions.   [address] Virginia Summers, M. Min., [phone].

## 2021-09-01 NOTE — Plan of Care (Signed)

## 2021-09-01 NOTE — Progress Notes (Addendum)
Progress Note  Patient Name: Virginia Summers Date of Encounter: 09/01/2021  [facility] Cardiologist: Kristeen Miss, MD   Subjective   Feels OK   Inpatient Medications    Scheduled Meds:  amLODipine  10 mg Oral Daily   Chlorhexidine Gluconate Cloth  6 each Topical Daily   DULoxetine  60 mg Oral QHS   hydrochlorothiazide  25 mg Oral Daily   insulin aspart  0-6 Units Subcutaneous Q4H   levothyroxine  25 mcg Oral QAC breakfast   mometasone-formoterol  2 puff Inhalation BID   pantoprazole (PROTONIX) IV  40 mg Intravenous QHS   sodium chloride flush  10-40 mL Intracatheter Q12H   sodium chloride flush  3 mL Intravenous Q12H   Continuous Infusions:   ceFAZolin (ANCEF) IV 2 g (09/01/21 0506)   PRN Meds: acetaminophen, docusate sodium, hydrALAZINE, magic mouthwash w/lidocaine, ondansetron (ZOFRAN) IV, mouth rinse, oxyCODONE, polyethylene glycol   Vital Signs    Vitals:   09/01/21 0630 09/01/21 0700 09/01/21 0730 09/01/21 0741  BP:  (!) 162/47    Pulse: 62 60 60 60  Resp: 15 (!) 23 16 17   Temp:  98.1 F (36.7 C)    TempSrc:  Oral    SpO2: 96% 97% 96% 96%  Weight:      Height:        Intake/Output Summary (Last 24 hours) at 09/01/2021 1012 Last data filed at 09/01/2021 0539 Gross per 24 hour  Intake 760.27 ml  Output 970 ml  Net -209.73 ml      09/01/2021    5:00 AM 08/30/2021    4:00 AM 08/29/2021    2:16 AM  Last 3 Weights  Weight (lbs) 185 lb 13.6 oz 189 lb 2.5 oz 190 lb 0.6 oz  Weight (kg) 84.3 kg 85.8 kg 86.2 kg      Telemetry    SB 50s, some sloer junctional rhythm - Personally Reviewed  ECG    No new EKGs - Personally Reviewed  Physical Exam   GEN: No acute distress.   Neck: No JVD, R IJ triple lumen Cardiac: RRR, no murmurs, rubs, or gallops.  Respiratory: CTA b/l GI: Soft, nontender, non-distended  MS: No edema; No deformity. Neuro:  Nonfocal  Psych: Normal affect   Labs    High Sensitivity Troponin:   Recent Labs  Lab 08/27/21 1425  08/27/21 1716 08/29/21 0354  TROPONINIHS 14 24* 37*     Chemistry Recent Labs  Lab 08/29/21 0330 08/29/21 1839 08/30/21 0350 08/31/21 0535 09/01/21 0056  NA 137   < > 140 140 138  K 4.4   < > 4.5 4.9 4.6  CL 104   < > 104 107 105  CO2 28   < > 29 29 26   GLUCOSE 145*   < > 105* 163* 157*  BUN 24*   < > 38* 29* 31*  CREATININE 1.57*   < > 1.79* 1.26* 1.34*  CALCIUM 8.0*   < > 8.5* 8.2* 8.2*  MG 2.6*  --  2.7* 2.4  --   PROT 6.5  --  6.4*  --  5.8*  ALBUMIN 2.8*   < > 2.9* 2.4* 2.6*  AST 27  --  17  --  18  ALT 22  --  15  --  8  ALKPHOS 44  --  44  --  44  BILITOT 0.3  --  0.3  --  0.4  GFRNONAA 33*   < > 28* 43* 40*  ANIONGAP 5   < > 7 4* 7   < > = values in this interval not displayed.    Lipids No results for input(s): "CHOL", "TRIG", "HDL", "LABVLDL", "LDLCALC", "CHOLHDL" in the last 168 hours.  Hematology Recent Labs  Lab 08/30/21 0350 08/31/21 0535 09/01/21 0056  WBC 7.4 7.5 8.1  RBC 3.32* 2.91* 2.98*  HGB 8.4* 7.6* 7.8*  HCT 28.6* 25.2* 25.7*  MCV 86.1 86.6 86.2  MCH 25.3* 26.1 26.2  MCHC 29.4* 30.2 30.4  RDW 15.8* 15.6* 15.6*  PLT 245 215 232   Thyroid  Recent Labs  Lab 08/28/21 0256  TSH 0.613  FREET4 1.04    BNP Recent Labs  Lab 08/27/21 1425  BNP 1,166.7*    DDimer  Recent Labs  Lab 08/27/21 1427  DDIMER 1.10*     Radiology    DG Chest 2 View  Result Date: 09/01/2021 CLINICAL DATA:  Pacemaker EXAM: CHEST - 2 VIEW COMPARISON:  08/28/2021 FINDINGS: Interval placement of left chest multi lead pacer, leads projecting over the vicinity of the right atrium and right ventricular apex. Cardiomegaly. Mild, diffuse bilateral interstitial pulmonary opacity. Disc degenerative disease of the thoracic spine. IMPRESSION: 1. Interval placement of left chest multi lead pacer, leads projecting over the vicinity of the right atrium and right ventricular apex. 2. Cardiomegaly. Mild, diffuse bilateral interstitial pulmonary opacity, likely edema.  Electronically Signed   By: Delanna Ahmadi M.D.   On: 09/01/2021 08:20   EP PPM/ICD IMPLANT  Result Date: 08/31/2021 CONCLUSIONS:  1. Successful implantation of a [employer] dual-chamber pacemaker for symptomatic bradycardia due to complete heart block  2. No early apparent complications.       Cristopher Peru, MD 08/31/2021 5:37 PM    Cardiac Studies   Echocardiogram 08/28/21   1. Septal hypokinesis consistent with ventricular pacing. Left  ventricular ejection fraction, by estimation, is 50 to 55%. The left  ventricle has low normal function. The left ventricle demonstrates  regional wall motion abnormalities (see scoring  diagram/findings for description). There is mild concentric left  ventricular hypertrophy. Left ventricular diastolic parameters are  consistent with Grade I diastolic dysfunction (impaired relaxation).  Elevated left ventricular end-diastolic pressure.   2. Right ventricular systolic function is normal. The right ventricular  size is normal. There is moderately elevated pulmonary artery systolic  pressure.   3. Left atrial size was mildly dilated.   4. The mitral valve is normal in structure. Trivial mitral valve  regurgitation. No evidence of mitral stenosis.   5. The aortic valve is tricuspid. Aortic valve regurgitation is not  visualized. No aortic stenosis is present.   6. The inferior vena cava is dilated in size with >50% respiratory  variability, suggesting right atrial pressure of 8 mmHg.   Patient Profile     81 y.o. female w/PMHx of HTN, hypothyroidism, DM, DVT/PE, , COPD, obesity, hypoventilation sx (on home O2) presented with 5 days of SOB and near syncope, she was found in [facility] treated with atropine > transcutaneous pacing, labs noted AKI and severe hyperkalemia (>7.5), placed on BIPAP and catheter placed for emergent dialysis.  EP brought on board with continued episodes of CHB despite electrolyte correction and renal function improvement, discussed that she  had [facility] 08/27/21 that noted staph epi but suspect to be contaminant and planned to transfer for PPM  Assessment & Plan    Intermittent CHB Initially thought to be driven by AKI/severe hyperkalemia Though continued intermittently despite  improvement in her labs  She was intermittently transcutaneously paced while at [facility]  Since here she is maintaining SB 50's with some intermittent junctional rhythm that dips to high 40's  08/27/21 BC +staph epidermis, staph capitus (x2) 08/28/21, BC (remain neg to date) (x2) Appreciate ID input  NOW, she is s/p PPM implant yesterday with Dr. Ladona Ridgel Stable site CXR this AM without ptx Device check this AM with stable measurements Wound care and activity restrictions reviewed with the patient Post pacer EP follow up is in place  2. AKI on CKD Nephrology on board Suspect 2/2 new NSAIDs and escalating BP meds perhaps (ARB) Creat up slightly from yesterday + urine OP   3. HTN Stable, wide pulse pressure of unclear etiology No AI on her echo Meds deferred to primary cardiologist/team Avoiding nephrotoxins  4. 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 Anemia Stable H/H  Chronic and not thought to be actively bleeding Deferred to medicine team   Dr. Ladona Ridgel has seen the patient this AM EP will sign off  though remain available, please recall if needed I will remove tegaderm dressing once dispsition is clear   For questions or updates, please contact [facility] Please consult [email or web address] for contact info under     Signed, Sheilah Pigeon, PA-C  09/01/2021, 10:12 AM    EP Attending  Patient seen and examined. Agree with above. The patient is doing well after pacemaker insertion and is pacing at 60. Her tele demonstrates NSR. She can be discharged home from EP perspective. Interrogation of her PPM under my direction demonstrates normal DDD PM function. She will undergo the usual followup.    Sharlot Gowda Karmine Kauer,MD

## 2021-09-02 DIAGNOSIS — N1832 Chronic kidney disease, stage 3b: Secondary | ICD-10-CM | POA: Diagnosis not present

## 2021-09-02 DIAGNOSIS — I442 Atrioventricular block, complete: Secondary | ICD-10-CM | POA: Diagnosis not present

## 2021-09-02 DIAGNOSIS — N179 Acute kidney failure, unspecified: Secondary | ICD-10-CM | POA: Diagnosis not present

## 2021-09-02 DIAGNOSIS — B957 Other staphylococcus as the cause of diseases classified elsewhere: Secondary | ICD-10-CM

## 2021-09-02 DIAGNOSIS — R7881 Bacteremia: Secondary | ICD-10-CM | POA: Diagnosis not present

## 2021-09-02 LAB — CULTURE, BLOOD (ROUTINE X 2)
Culture: NO GROWTH
Culture: NO GROWTH
Special Requests: ADEQUATE
Special Requests: ADEQUATE
Special Requests: ADEQUATE

## 2021-09-02 LAB — GLUCOSE, CAPILLARY
Glucose-Capillary: 116 mg/dL — ABNORMAL HIGH (ref 70–99)
Glucose-Capillary: 127 mg/dL — ABNORMAL HIGH (ref 70–99)
Glucose-Capillary: 156 mg/dL — ABNORMAL HIGH (ref 70–99)
Glucose-Capillary: 182 mg/dL — ABNORMAL HIGH (ref 70–99)
Glucose-Capillary: 276 mg/dL — ABNORMAL HIGH (ref 70–99)

## 2021-09-02 MED ORDER — INSULIN ASPART 100 UNIT/ML IJ SOLN
0.0000 [IU] | Freq: Three times a day (TID) | INTRAMUSCULAR | Status: DC
  Administered 2021-09-03: 1 [IU] via SUBCUTANEOUS

## 2021-09-02 NOTE — Plan of Care (Signed)
  Problem: Education: Goal: Knowledge of General Education information will improve Description: Including pain rating scale, medication(s)/side effects and non-pharmacologic comfort measures Outcome: Completed/Met   Problem: Health Behavior/Discharge Planning: Goal: Ability to manage health-related needs will improve Outcome: Completed/Met   Problem: Clinical Measurements: Goal: Ability to maintain clinical measurements within normal limits will improve Problem: Activity: Goal: Risk for activity intolerance will decrease Outcome: Completed/Met   Problem: Nutrition: Goal: Adequate nutrition will be maintained Outcome: Completed/Met   Problem: Coping: Goal: Level of anxiety will decrease Outcome: Completed/Met   Problem: Elimination: Goal: Will not experience complications related to bowel motility Outcome: Completed/Met Goal: Will not experience complications related to urinary retention Outcome: Completed/Met   Problem: Pain Managment: Goal: General experience of comfort will improve Outcome: Completed/Met   Problem: Safety: Goal: Ability to remain free from injury will improve Outcome: Completed/Met   Problem: Skin Integrity: Goal: Risk for impaired skin integrity will decrease Outcome: Completed/Met   Problem: Education: Goal: Ability to describe self-care measures that may prevent or decrease complications (Diabetes Survival Skills Education) will improve Outcome: Completed/Met Goal: Individualized Educational Video(s) Outcome: Completed/Met   Problem: Coping: Goal: Ability to adjust to condition or change in health will improve Outcome: Completed/Met   Problem: Fluid Volume: Goal: Ability to maintain a balanced intake and output will improve Outcome: Completed/Met   Problem: Health Behavior/Discharge Planning: Goal: Ability to identify and utilize available resources and services will improve Outcome: Completed/Met Goal: Ability to manage health-related  needs will improve Outcome: Completed/Met   Problem: Metabolic: Goal: Ability to maintain appropriate glucose levels will improve Outcome: Completed/Met   Problem: Nutritional: Goal: Maintenance of adequate nutrition will improve Outcome: Completed/Met Goal: Progress toward achieving an optimal weight will improve Outcome: Completed/Met   Problem: Tissue Perfusion: Goal: Adequacy of tissue perfusion will improve Outcome: Completed/Met   Problem: Skin Integrity: Goal: Risk for impaired skin integrity will decrease Outcome: Completed/Met   Outcome: Progressing Goal: Diagnostic test results will improve Outcome: Completed/Met Goal: Respiratory complications will improve Outcome: Completed/Met Goal: Cardiovascular complication will be avoided Outcome: Completed/Met

## 2021-09-02 NOTE — Discharge Instructions (Addendum)
    Supplemental Discharge Instructions for  Pacemaker/Defibrillator Patients    Activity No heavy lifting or vigorous activity with your left/right arm for 6 to 8 weeks.  Do not raise your left/right arm above your head for one week.  Gradually raise your affected arm as drawn below.             09/04/21                   09/05/21                     09/06/21                09/07/21 __  If you drive: NO DRIVING for  1 week  ; you may begin driving on  9/39/03   .  WOUND CARE Keep the wound area clean and dry.  Do not get this area wet , no showers for one week; you may shower on  09/07/21   . The tape/steri-strips on your wound will fall off; do not pull them off.  No bandage is needed on the site.  DO  NOT apply any creams, oils, or ointments to the wound area. If you notice any drainage or discharge from the wound, any swelling or bruising at the site, or you develop a fever > 101? F after you are discharged home, call the office at once.  Special Instructions You are still able to use cellular telephones; use the ear opposite the side where you have your pacemaker/defibrillator.  Avoid carrying your cellular phone near your device. When traveling through airports, show security personnel your identification card to avoid being screened in the metal detectors.  Ask the security personnel to use the hand wand. Avoid arc welding equipment, MRI testing (magnetic resonance imaging), TENS units (transcutaneous nerve stimulators).  Call the office for questions about other devices. Avoid electrical appliances that are in poor condition or are not properly grounded. Microwave ovens are safe to be near or to operate.

## 2021-09-02 NOTE — Care Management Important Message (Signed)
Important Message  Patient Details  Name: Virginia Summers MRN: [id number] Date of Birth: [birth year]   Medicare Important Message Given:  Yes     Renie Ora 09/02/2021, 8:09 AM

## 2021-09-02 NOTE — Progress Notes (Signed)
   Dr. Ladona Ridgel note reviewed, EP. Pacer in place, functioning well. Will have EP follow up.   Will sign off.  Let us know if we can be of further assistance.   Donato Schultz, MD

## 2021-09-02 NOTE — Progress Notes (Signed)
Progress Note   Patient: Virginia Summers JKD:[id number] DOB: [birth year] DOA: 08/27/2021     6 DOS: the patient was seen and examined on 09/02/2021   Brief hospital course: 81 year old woman admitted by critical care 6/16 for AKI, hyperkalemia and complete heart block.  Required CRRT x2 days and transcutaneous pacing.  Transferred to [facility] 6/19 for EP evaluation and underwent placement of permanent pacemaker.  Positive blood cultures noted but thought to be contaminant.  Transferred to [facility] hospitalist service 6/22.  Assessment and Plan: Intermittent complete heart block with symptomatic bradycardia --s/p PPM 6/20; EP and cardiology have signed off. Telemetry shows paced rhythm.   Bacteremia, probably contamination --Blood cultures from two separate sites both growing staph epi and staph capitis; thought to be contaminants --repeat BC from 6/17 NG/F --ID last saw 6/19;, thought to be contaminants   Acute UTI with Klebsiella --will complete abx   AKI on CKD stage 3b likely in the setting of hypertensive crisis, metformin, meloxicam + dehydration. Complicated by life threatening hyperkalemia --s/p CRRT 6/16 > 6/18.   --creatinine stable; improved from admission --follow-up as an outpatient  Acute on chronic hypoxemic respiratory failure --acute component resolved. On 3 L nocturnal oxygen at home.   Possibly undiagnosed OSA --consider outpatient sleep study   Hypertensive crisis improved with history of severe uncontrolled hypertension with previous admissions for hypertensive emergency  On Diovan, hydralazine and amlodipine at home --BP stable; continue norvasc, HCTZ, prn hydralazine   Chronic HFpEF --appears stable   History of extensive bilateral pulmonary embolism 05/26/2010 (negative evaluation CT February 2021 and September 2022] -on permanent anticoagulation with currently on proph dose Eliquis  --resume anticoagulation 6/24 per EP note    Diabetes type 2 --Hemoglobin  A1c 7.6 --change SSI to QAC/HS --stop metformin on discharge   Hypothyroidism   --continue Synthroid (TSH normal this admission]   Chronic back pain, reported bed bound at home --avoid meloxicam given AKI - continue tylenol, prn oxy IR, Cymbalta       Subjective:  Feels better Breathing ok Eating ok  Physical Exam: Vitals:   09/02/21 0736 09/02/21 1037 09/02/21 1200 09/02/21 1600  BP:  (!) 187/47 (!) 144/33 (!) 152/89  Pulse: 60 60  60  Resp: (!) 23 17  19   Temp:  98.6 F (37 C)  98.2 F (36.8 C)  TempSrc:  Oral  Oral  SpO2: 93% 92%  91%  Weight:      Height:       Physical Exam Vitals reviewed.  Constitutional:      General: She is not in acute distress.    Appearance: She is not ill-appearing or toxic-appearing.  Cardiovascular:     Rate and Rhythm: Normal rate and regular rhythm.     Heart sounds: No murmur heard.    Comments: Paced rhythm on telemetry Pulmonary:     Effort: Pulmonary effort is normal. No respiratory distress.     Breath sounds: No wheezing, rhonchi or rales.  Neurological:     Mental Status: She is alert.  Psychiatric:        Mood and Affect: Mood normal.        Behavior: Behavior normal.     Data Reviewed: UOP 675+ CBG stable Creatinine stable 1.34 Hgb stable 7.8  Family Communication: daughter at bedside  Disposition: Status is: Inpatient Remains inpatient appropriate because: AKI  Planned Discharge Destination: Home    Time spent: 25 minutes  Author: , MD 09/02/2021 6:28 PM  For  on call review [email or web address].

## 2021-09-02 NOTE — TOC Progression Note (Signed)
Transition of Care [facility]) - Progression Note    Patient Details  Name: RANEISHA BRESS MRN: [id number] Date of Birth: Nov 05, 1940  Transition of Care [facility]) CM/SW Contact  Leone Haven, RN Phone Number: 09/02/2021, 11:25 AM  Clinical Narrative:    NCM spoke with patient and daughter at bedside, offered choice for HHPT, they state they had [facility] before and they did a really good job and they would like to have them again.  NCM made referral to Brandi with [facility].  She will call this NCM back with the soc date.  Patient has a walker, cane and oxygen prn with [employer] at home.  She has PCP, Dr. Pollie Friar.  Daughter states she was suppose to have a follow up this week so she will reschedule it with him.     Expected Discharge Plan: Home w Home Health Services Barriers to Discharge: Continued Medical Work up  Expected Discharge Plan and Services Expected Discharge Plan: Home w Home Health Services   Discharge Planning Services: CM Consult Post Acute Care Choice: Home Health Living arrangements for the past 2 months: Single Family Home                   DME Agency: NA       HH Arranged: PT HH Agency: [facility] Date HH Agency Contacted: 09/02/21 Time HH Agency Contacted: 1124 Representative spoke with at [facility] Agency: Brandi   Social Determinants of Health (SDOH) Interventions    Readmission Risk Interventions     No data to display

## 2021-09-03 DIAGNOSIS — N1832 Chronic kidney disease, stage 3b: Secondary | ICD-10-CM | POA: Diagnosis not present

## 2021-09-03 DIAGNOSIS — I442 Atrioventricular block, complete: Secondary | ICD-10-CM | POA: Diagnosis not present

## 2021-09-03 DIAGNOSIS — J9621 Acute and chronic respiratory failure with hypoxia: Secondary | ICD-10-CM | POA: Diagnosis not present

## 2021-09-03 DIAGNOSIS — N179 Acute kidney failure, unspecified: Secondary | ICD-10-CM | POA: Diagnosis not present

## 2021-09-03 LAB — BASIC METABOLIC PANEL
Anion gap: 4 — ABNORMAL LOW (ref 5–15)
BUN: 28 mg/dL — ABNORMAL HIGH (ref 8–23)
CO2: 26 mmol/L (ref 22–32)
Calcium: 8.4 mg/dL — ABNORMAL LOW (ref 8.9–10.3)
Chloride: 103 mmol/L (ref 98–111)
Creatinine, Ser: 1.27 mg/dL — ABNORMAL HIGH (ref 0.44–1.00)
GFR, Estimated: 42 mL/min — ABNORMAL LOW (ref >60)
Glucose, Bld: 135 mg/dL — ABNORMAL HIGH (ref 70–99)
Potassium: 4.6 mmol/L (ref 3.5–5.1)
Sodium: 133 mmol/L — ABNORMAL LOW (ref 135–145)

## 2021-09-03 LAB — GLUCOSE, CAPILLARY
Glucose-Capillary: 173 mg/dL — ABNORMAL HIGH (ref 70–99)
Glucose-Capillary: 134 mg/dL — ABNORMAL HIGH (ref 70–99)

## 2021-09-03 MED ORDER — RIVAROXABAN 10 MG PO TABS: 10.0000 mg | ORAL_TABLET | Freq: Every day | ORAL | 2 refills | Status: AC

## 2021-09-03 MED ORDER — OXYCODONE HCL 5 MG PO TABS: 5.0000 mg | ORAL_TABLET | Freq: Three times a day (TID) | ORAL | 0 refills | Status: DC | PRN

## 2021-09-15 ENCOUNTER — Ambulatory Visit: Payer: Medicare HMO

## 2021-09-22 ENCOUNTER — Ambulatory Visit (INDEPENDENT_AMBULATORY_CARE_PROVIDER_SITE_OTHER): Payer: Medicare HMO

## 2021-09-22 DIAGNOSIS — I442 Atrioventricular block, complete: Secondary | ICD-10-CM | POA: Diagnosis not present

## 2021-09-22 LAB — CUP PACEART INCLINIC DEVICE CHECK
Battery Remaining Longevity: 117 mo
Battery Voltage: 3.11 V
Brady Statistic RA Percent Paced: 22 %
Brady Statistic RV Percent Paced: 0.71 %
Date Time Interrogation Session: 20230712154400
Implantable Lead Implant Date: 20230620
Implantable Lead Implant Date: 20230620
Implantable Lead Location: 753859
Implantable Lead Location: 753860
Implantable Pulse Generator Implant Date: 20230620
Lead Channel Impedance Value: 375 Ohm
Lead Channel Impedance Value: 425 Ohm
Lead Channel Pacing Threshold Amplitude: 0.75 V
Lead Channel Pacing Threshold Amplitude: 0.75 V
Lead Channel Pacing Threshold Amplitude: 0.75 V
Lead Channel Pacing Threshold Amplitude: 0.75 V
Lead Channel Pacing Threshold Pulse Width: 0.5 ms
Lead Channel Pacing Threshold Pulse Width: 0.5 ms
Lead Channel Pacing Threshold Pulse Width: 0.5 ms
Lead Channel Pacing Threshold Pulse Width: 0.5 ms
Lead Channel Sensing Intrinsic Amplitude: 1.9 mV
Lead Channel Sensing Intrinsic Amplitude: 7.5 mV
Lead Channel Setting Pacing Amplitude: 3.5 V
Lead Channel Setting Pacing Amplitude: 3.5 V
Lead Channel Setting Pacing Pulse Width: 0.5 ms
Lead Channel Setting Sensing Sensitivity: 2 mV
Pulse Gen Model: 2272
Pulse Gen Serial Number: 8091994

## 2021-09-22 NOTE — Progress Notes (Signed)
Wound check appointment. Steri-strips removed. Wound without redness or edema. Incision edges approximated, wound well healed. Normal device function. Thresholds, sensing, and impedances consistent with implant measurements. Device programmed at 3.5V/auto capture programmed on for extra safety margin until 3 month visit. Histogram distribution appropriate for patient and level of activity. No mode switches or high ventricular rates noted. Patient educated about wound care, arm mobility, lifting restrictions. ROV December 16, 2021 with Dr. Ladona Ridgel.

## 2021-09-29 ENCOUNTER — Institutional Professional Consult (permissible substitution): Payer: Medicare HMO | Admitting: Adult Health

## 2021-10-09 ENCOUNTER — Other Ambulatory Visit: Payer: Self-pay | Admitting: Cardiovascular Disease

## 2021-10-10 ENCOUNTER — Other Ambulatory Visit: Payer: Self-pay | Admitting: Cardiovascular Disease

## 2021-10-29 ENCOUNTER — Inpatient Hospital Stay (HOSPITAL_COMMUNITY): Payer: Medicare HMO

## 2021-10-29 ENCOUNTER — Other Ambulatory Visit: Payer: Self-pay

## 2021-10-29 ENCOUNTER — Encounter (HOSPITAL_COMMUNITY): Payer: Self-pay

## 2021-10-29 ENCOUNTER — Emergency Department (HOSPITAL_COMMUNITY): Payer: Medicare HMO

## 2021-10-29 ENCOUNTER — Inpatient Hospital Stay (HOSPITAL_COMMUNITY)
Admission: EM | Admit: 2021-10-29 | Discharge: 2021-11-08 | DRG: 193 | Disposition: A | Discharge status: Hospice | Payer: Medicare HMO | Attending: Internal Medicine | Admitting: Internal Medicine

## 2021-10-29 DIAGNOSIS — R0603 Acute respiratory distress: Secondary | ICD-10-CM | POA: Diagnosis not present

## 2021-10-29 DIAGNOSIS — J440 Chronic obstructive pulmonary disease with acute lower respiratory infection: Secondary | ICD-10-CM | POA: Diagnosis present

## 2021-10-29 DIAGNOSIS — Z6835 Body mass index (BMI) 35.0-35.9, adult: Secondary | ICD-10-CM

## 2021-10-29 DIAGNOSIS — J441 Chronic obstructive pulmonary disease with (acute) exacerbation: Secondary | ICD-10-CM | POA: Diagnosis present

## 2021-10-29 DIAGNOSIS — J9621 Acute and chronic respiratory failure with hypoxia: Secondary | ICD-10-CM | POA: Diagnosis present

## 2021-10-29 DIAGNOSIS — Z515 Encounter for palliative care: Secondary | ICD-10-CM

## 2021-10-29 DIAGNOSIS — L89151 Pressure ulcer of sacral region, stage 1: Secondary | ICD-10-CM | POA: Diagnosis present

## 2021-10-29 DIAGNOSIS — N1832 Chronic kidney disease, stage 3b: Secondary | ICD-10-CM | POA: Diagnosis present

## 2021-10-29 DIAGNOSIS — Z880 Allergy status to penicillin: Secondary | ICD-10-CM

## 2021-10-29 DIAGNOSIS — N179 Acute kidney failure, unspecified: Secondary | ICD-10-CM | POA: Diagnosis present

## 2021-10-29 DIAGNOSIS — K219 Gastro-esophageal reflux disease without esophagitis: Secondary | ICD-10-CM | POA: Diagnosis not present

## 2021-10-29 DIAGNOSIS — J13 Pneumonia due to Streptococcus pneumoniae: Principal | ICD-10-CM | POA: Diagnosis present

## 2021-10-29 DIAGNOSIS — Z20822 Contact with and (suspected) exposure to covid-19: Secondary | ICD-10-CM | POA: Diagnosis present

## 2021-10-29 DIAGNOSIS — E1122 Type 2 diabetes mellitus with diabetic chronic kidney disease: Secondary | ICD-10-CM | POA: Diagnosis present

## 2021-10-29 DIAGNOSIS — I10 Essential (primary) hypertension: Secondary | ICD-10-CM | POA: Diagnosis present

## 2021-10-29 DIAGNOSIS — Z8674 Personal history of sudden cardiac arrest: Secondary | ICD-10-CM

## 2021-10-29 DIAGNOSIS — Z888 Allergy status to other drugs, medicaments and biological substances status: Secondary | ICD-10-CM

## 2021-10-29 DIAGNOSIS — E876 Hypokalemia: Secondary | ICD-10-CM | POA: Diagnosis present

## 2021-10-29 DIAGNOSIS — Z87891 Personal history of nicotine dependence: Secondary | ICD-10-CM | POA: Diagnosis not present

## 2021-10-29 DIAGNOSIS — Z7984 Long term (current) use of oral hypoglycemic drugs: Secondary | ICD-10-CM | POA: Diagnosis not present

## 2021-10-29 DIAGNOSIS — Z7189 Other specified counseling: Secondary | ICD-10-CM | POA: Diagnosis not present

## 2021-10-29 DIAGNOSIS — I110 Hypertensive heart disease with heart failure: Secondary | ICD-10-CM

## 2021-10-29 DIAGNOSIS — Z794 Long term (current) use of insulin: Secondary | ICD-10-CM

## 2021-10-29 DIAGNOSIS — E1165 Type 2 diabetes mellitus with hyperglycemia: Secondary | ICD-10-CM | POA: Diagnosis present

## 2021-10-29 DIAGNOSIS — Z7951 Long term (current) use of inhaled steroids: Secondary | ICD-10-CM

## 2021-10-29 DIAGNOSIS — E872 Acidosis, unspecified: Secondary | ICD-10-CM | POA: Diagnosis present

## 2021-10-29 DIAGNOSIS — K140 Glossitis: Secondary | ICD-10-CM | POA: Diagnosis present

## 2021-10-29 DIAGNOSIS — E1169 Type 2 diabetes mellitus with other specified complication: Secondary | ICD-10-CM | POA: Diagnosis present

## 2021-10-29 DIAGNOSIS — J449 Chronic obstructive pulmonary disease, unspecified: Secondary | ICD-10-CM

## 2021-10-29 DIAGNOSIS — I442 Atrioventricular block, complete: Secondary | ICD-10-CM | POA: Diagnosis present

## 2021-10-29 DIAGNOSIS — E039 Hypothyroidism, unspecified: Secondary | ICD-10-CM | POA: Diagnosis present

## 2021-10-29 DIAGNOSIS — I130 Hypertensive heart and chronic kidney disease with heart failure and stage 1 through stage 4 chronic kidney disease, or unspecified chronic kidney disease: Secondary | ICD-10-CM | POA: Diagnosis present

## 2021-10-29 DIAGNOSIS — I5033 Acute on chronic diastolic (congestive) heart failure: Secondary | ICD-10-CM | POA: Diagnosis present

## 2021-10-29 DIAGNOSIS — E669 Obesity, unspecified: Secondary | ICD-10-CM | POA: Diagnosis present

## 2021-10-29 DIAGNOSIS — D509 Iron deficiency anemia, unspecified: Secondary | ICD-10-CM | POA: Diagnosis present

## 2021-10-29 DIAGNOSIS — E871 Hypo-osmolality and hyponatremia: Secondary | ICD-10-CM | POA: Diagnosis present

## 2021-10-29 DIAGNOSIS — J189 Pneumonia, unspecified organism: Secondary | ICD-10-CM | POA: Diagnosis not present

## 2021-10-29 DIAGNOSIS — Z8616 Personal history of COVID-19: Secondary | ICD-10-CM | POA: Diagnosis not present

## 2021-10-29 DIAGNOSIS — E119 Type 2 diabetes mellitus without complications: Secondary | ICD-10-CM | POA: Diagnosis not present

## 2021-10-29 DIAGNOSIS — Z882 Allergy status to sulfonamides status: Secondary | ICD-10-CM

## 2021-10-29 DIAGNOSIS — R221 Localized swelling, mass and lump, neck: Secondary | ICD-10-CM | POA: Diagnosis not present

## 2021-10-29 DIAGNOSIS — Z950 Presence of cardiac pacemaker: Secondary | ICD-10-CM

## 2021-10-29 DIAGNOSIS — Z79899 Other long term (current) drug therapy: Secondary | ICD-10-CM

## 2021-10-29 DIAGNOSIS — Z833 Family history of diabetes mellitus: Secondary | ICD-10-CM

## 2021-10-29 DIAGNOSIS — F419 Anxiety disorder, unspecified: Secondary | ICD-10-CM | POA: Diagnosis present

## 2021-10-29 DIAGNOSIS — Z86711 Personal history of pulmonary embolism: Secondary | ICD-10-CM | POA: Diagnosis not present

## 2021-10-29 DIAGNOSIS — Z7901 Long term (current) use of anticoagulants: Secondary | ICD-10-CM

## 2021-10-29 DIAGNOSIS — Z8249 Family history of ischemic heart disease and other diseases of the circulatory system: Secondary | ICD-10-CM

## 2021-10-29 DIAGNOSIS — R220 Localized swelling, mass and lump, head: Secondary | ICD-10-CM | POA: Diagnosis not present

## 2021-10-29 DIAGNOSIS — J9601 Acute respiratory failure with hypoxia: Secondary | ICD-10-CM | POA: Diagnosis not present

## 2021-10-29 DIAGNOSIS — Z79890 Hormone replacement therapy (postmenopausal): Secondary | ICD-10-CM

## 2021-10-29 DIAGNOSIS — Z9981 Dependence on supplemental oxygen: Secondary | ICD-10-CM

## 2021-10-29 DIAGNOSIS — I129 Hypertensive chronic kidney disease with stage 1 through stage 4 chronic kidney disease, or unspecified chronic kidney disease: Secondary | ICD-10-CM | POA: Diagnosis not present

## 2021-10-29 LAB — ECHOCARDIOGRAM LIMITED
Area-P 1/2: 3.42 cm2
Height: 59 in
S' Lateral: 3.1 cm
Weight: 2864 oz

## 2021-10-29 LAB — GLUCOSE, CAPILLARY
Glucose-Capillary: 404 mg/dL — ABNORMAL HIGH (ref 70–99)
Glucose-Capillary: 322 mg/dL — ABNORMAL HIGH (ref 70–99)

## 2021-10-29 LAB — COMPREHENSIVE METABOLIC PANEL
ALT: 12 U/L (ref 0–44)
AST: 24 U/L (ref 15–41)
Albumin: 2.9 g/dL — ABNORMAL LOW (ref 3.5–5.0)
Alkaline Phosphatase: 53 U/L (ref 38–126)
Anion gap: 11 (ref 5–15)
BUN: 26 mg/dL — ABNORMAL HIGH (ref 8–23)
CO2: 24 mmol/L (ref 22–32)
Calcium: 8.2 mg/dL — ABNORMAL LOW (ref 8.9–10.3)
Chloride: 97 mmol/L — ABNORMAL LOW (ref 98–111)
Creatinine, Ser: 1.25 mg/dL — ABNORMAL HIGH (ref 0.44–1.00)
GFR, Estimated: 43 mL/min — ABNORMAL LOW (ref >60)
Glucose, Bld: 325 mg/dL — ABNORMAL HIGH (ref 70–99)
Potassium: 2.9 mmol/L — ABNORMAL LOW (ref 3.5–5.1)
Sodium: 132 mmol/L — ABNORMAL LOW (ref 135–145)
Total Bilirubin: 0.9 mg/dL (ref 0.3–1.2)
Total Protein: 6.9 g/dL (ref 6.5–8.1)

## 2021-10-29 LAB — RETICULOCYTES
Immature Retic Fract: 28.9 % — ABNORMAL HIGH (ref 2.3–15.9)
RBC.: 3.06 MIL/uL — ABNORMAL LOW (ref 3.87–5.11)
Retic Count, Absolute: 63 10*3/uL (ref 19.0–186.0)
Retic Ct Pct: 2.1 % (ref 0.4–3.1)

## 2021-10-29 LAB — IRON AND TIBC
Iron: 14 ug/dL — ABNORMAL LOW (ref 28–170)
Saturation Ratios: 4 % — ABNORMAL LOW (ref 10.4–31.8)
TIBC: 385 ug/dL (ref 250–450)
UIBC: 371 ug/dL

## 2021-10-29 LAB — CBC WITH DIFFERENTIAL/PLATELET
Abs Immature Granulocytes: 0.09 10*3/uL — ABNORMAL HIGH (ref 0.00–0.07)
Basophils Absolute: 0 10*3/uL (ref 0.0–0.1)
Basophils Relative: 0 %
Eosinophils Absolute: 0 10*3/uL (ref 0.0–0.5)
Eosinophils Relative: 0 %
HCT: 23.7 % — ABNORMAL LOW (ref 36.0–46.0)
Hemoglobin: 7 g/dL — ABNORMAL LOW (ref 12.0–15.0)
Immature Granulocytes: 1 %
Lymphocytes Relative: 21 %
Lymphs Abs: 1.5 10*3/uL (ref 0.7–4.0)
MCH: 21.7 pg — ABNORMAL LOW (ref 26.0–34.0)
MCHC: 29.5 g/dL — ABNORMAL LOW (ref 30.0–36.0)
MCV: 73.6 fL — ABNORMAL LOW (ref 80.0–100.0)
Monocytes Absolute: 0.4 10*3/uL (ref 0.1–1.0)
Monocytes Relative: 6 %
Neutro Abs: 5.2 10*3/uL (ref 1.7–7.7)
Neutrophils Relative %: 72 %
Platelets: 209 10*3/uL (ref 150–400)
RBC: 3.22 MIL/uL — ABNORMAL LOW (ref 3.87–5.11)
RDW: 16.4 % — ABNORMAL HIGH (ref 11.5–15.5)
WBC: 7.2 10*3/uL (ref 4.0–10.5)
nRBC: 0 % (ref 0.0–0.2)

## 2021-10-29 LAB — STREP PNEUMONIAE URINARY ANTIGEN: Strep Pneumo Urinary Antigen: POSITIVE — AB

## 2021-10-29 LAB — MAGNESIUM: Magnesium: 1.6 mg/dL — ABNORMAL LOW (ref 1.7–2.4)

## 2021-10-29 LAB — TROPONIN I (HIGH SENSITIVITY)
Troponin I (High Sensitivity): 28 ng/L — ABNORMAL HIGH (ref <18)
Troponin I (High Sensitivity): 29 ng/L — ABNORMAL HIGH (ref <18)

## 2021-10-29 LAB — MRSA NEXT GEN BY PCR, NASAL: MRSA by PCR Next Gen: NOT DETECTED

## 2021-10-29 LAB — BRAIN NATRIURETIC PEPTIDE: B Natriuretic Peptide: 494.5 pg/mL — ABNORMAL HIGH (ref 0.0–100.0)

## 2021-10-29 LAB — RESP PANEL BY RT-PCR (FLU A&B, COVID) ARPGX2
Influenza A by PCR: NEGATIVE
Influenza B by PCR: NEGATIVE
SARS Coronavirus 2 by RT PCR: NEGATIVE

## 2021-10-29 LAB — TSH: TSH: 0.357 u[IU]/mL (ref 0.350–4.500)

## 2021-10-29 LAB — FERRITIN: Ferritin: 51 ng/mL (ref 11–307)

## 2021-10-29 MED ORDER — SODIUM CHLORIDE 0.9 % IV SOLN: 500.0000 mg | INTRAVENOUS | Status: DC

## 2021-10-29 MED ORDER — SODIUM CHLORIDE 0.9 % IV SOLN
1.0000 g | Freq: Once | INTRAVENOUS | Status: AC
  Administered 2021-10-29: 1 g via INTRAVENOUS
  Filled 2021-10-29: qty 10

## 2021-10-29 MED ORDER — MAGNESIUM SULFATE 2 GM/50ML IV SOLN
2.0000 g | Freq: Once | INTRAVENOUS | Status: AC
  Administered 2021-10-29: 2 g via INTRAVENOUS
  Filled 2021-10-29: qty 50

## 2021-10-29 MED ORDER — SODIUM CHLORIDE 0.9 % IV SOLN
500.0000 mg | Freq: Once | INTRAVENOUS | Status: AC
  Administered 2021-10-29: 500 mg via INTRAVENOUS
  Filled 2021-10-29: qty 5

## 2021-10-29 MED ORDER — INSULIN ASPART 100 UNIT/ML IJ SOLN: 0.0000 [IU] | Freq: Three times a day (TID) | INTRAMUSCULAR | Status: DC

## 2021-10-29 MED ORDER — POLYETHYLENE GLYCOL 3350 17 G PO PACK: 17.0000 g | PACK | Freq: Every day | ORAL | Status: DC | PRN

## 2021-10-29 MED ORDER — IOHEXOL 350 MG/ML SOLN
75.0000 mL | Freq: Once | INTRAVENOUS | Status: AC | PRN
  Administered 2021-10-29: 75 mL via INTRAVENOUS

## 2021-10-29 MED ORDER — LEVOTHYROXINE SODIUM 25 MCG PO TABS: 25.0000 ug | ORAL_TABLET | Freq: Every day | ORAL | Status: DC

## 2021-10-29 MED ORDER — TRAZODONE HCL 50 MG PO TABS
50.0000 mg | ORAL_TABLET | Freq: Every evening | ORAL | Status: DC | PRN
  Administered 2021-10-29: 50 mg via ORAL
  Filled 2021-10-29: qty 1

## 2021-10-29 MED ORDER — POTASSIUM CHLORIDE 10 MEQ/100ML IV SOLN
10.0000 meq | INTRAVENOUS | Status: AC
  Administered 2021-10-29 (×2): 10 meq via INTRAVENOUS
  Filled 2021-10-29 (×2): qty 100

## 2021-10-29 MED ORDER — SODIUM CHLORIDE 0.9 % IV SOLN
1.0000 g | INTRAVENOUS | Status: AC
  Administered 2021-10-30 – 2021-11-02 (×4): 1 g via INTRAVENOUS
  Filled 2021-10-29 (×4): qty 10

## 2021-10-29 MED ORDER — POTASSIUM CHLORIDE CRYS ER 20 MEQ PO TBCR
40.0000 meq | EXTENDED_RELEASE_TABLET | Freq: Two times a day (BID) | ORAL | Status: DC
  Administered 2021-10-29: 40 meq via ORAL
  Filled 2021-10-29 (×2): qty 2

## 2021-10-29 MED ORDER — INSULIN ASPART 100 UNIT/ML IJ SOLN
5.0000 [IU] | Freq: Once | INTRAMUSCULAR | Status: AC
  Administered 2021-10-29: 5 [IU] via SUBCUTANEOUS

## 2021-10-29 MED ORDER — INSULIN DETEMIR 100 UNIT/ML ~~LOC~~ SOLN
8.0000 [IU] | Freq: Every day | SUBCUTANEOUS | Status: DC
  Administered 2021-10-29: 8 [IU] via SUBCUTANEOUS
  Filled 2021-10-29 (×2): qty 0.08

## 2021-10-29 MED ORDER — FUROSEMIDE 10 MG/ML IJ SOLN
20.0000 mg | Freq: Once | INTRAMUSCULAR | Status: AC
  Administered 2021-10-29: 20 mg via INTRAVENOUS
  Filled 2021-10-29: qty 2

## 2021-10-29 MED ORDER — HYDRALAZINE HCL 50 MG PO TABS
75.0000 mg | ORAL_TABLET | Freq: Three times a day (TID) | ORAL | Status: DC
  Administered 2021-10-29 – 2021-11-08 (×28): 75 mg via ORAL
  Filled 2021-10-29 (×29): qty 1
  Filled 2021-10-29: qty 3

## 2021-10-29 MED ORDER — IPRATROPIUM-ALBUTEROL 0.5-2.5 (3) MG/3ML IN SOLN
3.0000 mL | Freq: Once | RESPIRATORY_TRACT | Status: AC
  Administered 2021-10-29: 3 mL via RESPIRATORY_TRACT
  Filled 2021-10-29: qty 3

## 2021-10-29 MED ORDER — POTASSIUM CHLORIDE CRYS ER 20 MEQ PO TBCR
40.0000 meq | EXTENDED_RELEASE_TABLET | Freq: Once | ORAL | Status: AC
  Administered 2021-10-29: 40 meq via ORAL
  Filled 2021-10-29: qty 2

## 2021-10-29 MED ORDER — FLUTICASONE FUROATE-VILANTEROL 100-25 MCG/ACT IN AEPB
1.0000 | INHALATION_SPRAY | Freq: Every day | RESPIRATORY_TRACT | Status: DC
  Administered 2021-10-30 – 2021-11-08 (×8): 1 via RESPIRATORY_TRACT
  Filled 2021-10-29: qty 28

## 2021-10-29 MED ORDER — DULOXETINE HCL 60 MG PO CPEP
60.0000 mg | ORAL_CAPSULE | Freq: Every day | ORAL | Status: DC
  Administered 2021-10-29 – 2021-11-08 (×9): 60 mg via ORAL
  Filled 2021-10-29 (×11): qty 1

## 2021-10-29 MED ORDER — IPRATROPIUM-ALBUTEROL 0.5-2.5 (3) MG/3ML IN SOLN
3.0000 mL | RESPIRATORY_TRACT | Status: DC | PRN
  Administered 2021-11-03: 3 mL via RESPIRATORY_TRACT
  Filled 2021-10-29 (×2): qty 3

## 2021-10-29 MED ORDER — ENOXAPARIN SODIUM 40 MG/0.4ML IJ SOSY: 40.0000 mg | PREFILLED_SYRINGE | INTRAMUSCULAR | Status: DC

## 2021-10-29 MED ORDER — RIVAROXABAN 10 MG PO TABS
10.0000 mg | ORAL_TABLET | Freq: Every day | ORAL | Status: DC
  Administered 2021-10-29: 10 mg via ORAL
  Filled 2021-10-29 (×2): qty 1

## 2021-10-29 MED ORDER — AMLODIPINE BESYLATE 10 MG PO TABS
10.0000 mg | ORAL_TABLET | Freq: Every day | ORAL | Status: DC
  Administered 2021-10-29 – 2021-11-08 (×11): 10 mg via ORAL
  Filled 2021-10-29 (×8): qty 1
  Filled 2021-10-29: qty 2
  Filled 2021-10-29 (×2): qty 1

## 2021-10-29 NOTE — Inpatient Diabetes Management (Signed)
Inpatient Diabetes Program Recommendations  AACE/ADA: New Consensus Statement on Inpatient Glycemic Control (2015)  Target Ranges:  Prepandial:   less than 140 mg/dL      Peak postprandial:   less than 180 mg/dL (1-2 hours)      Critically ill patients:  140 - 180 mg/dL   Lab Results  Component Value Date   GLUCAP 134 (H) 09/03/2021   HGBA1C 7.6 (H) 08/27/2021    Review of Glycemic Control  Latest Reference Range & Units 10/29/21 08:28  Glucose 70 - 99 mg/dL 923 (H)   Diabetes history: DM 2 Outpatient Diabetes medications: Metformin 500 mg Daily Current orders for Inpatient glycemic control:  None being evaluated in the ED  A1c 7.6% on 6/16  Inpatient Diabetes Program Recommendations:    -  Consider Novolog 0-6 units tid + hs  Thanks,  Christena Deem RN, MSN, BC-ADM Inpatient Diabetes Coordinator Team Pager [phone] (8a-5p)

## 2021-10-29 NOTE — ED Triage Notes (Signed)
Pt bib ems from home c/o sob starting 8/17. Pt baseline is 2-3L O2. Pt stated she tried to use inhaler but it did not improve breathing.   HX COPD and Asthma Pacemaker  HD pt  EMS stated diminished lung sounds bilaterally upper and lower.   10 mg albuterol  125 mg Solumedrol   BP 170/80 CBG 266 HR 87 3L @ 90%

## 2021-10-29 NOTE — Progress Notes (Signed)
  Echocardiogram 2D Echocardiogram has been performed.  Delcie Roch 10/29/2021, 3:59 PM

## 2021-10-29 NOTE — ED Notes (Signed)
Pt says she is unsure when she goes to dialysis. Pt states her daughter is a better historian.

## 2021-10-29 NOTE — ED Notes (Signed)
RN placed pt on 3L nasal canula and pt O2 79%. EDP adjusted O2 to 5L and O2 90%

## 2021-10-29 NOTE — H&P (Signed)
Date: 10/29/2021               Patient Name:  Virginia Summers MRN: [id number]  DOB: [birth year] Age / Sex: 81 y.o., female   PCP: Eather Colas, FNP         Medical Service: Internal Medicine Teaching Service         Attending Physician: Dr. Gust Rung, DO    First Contact: Dr. Sherrilee Gilles  Pager: [phone]  Second Contact: Dr. Neldon Newport  Pager: [phone]       After Hours (After 5p/  First Contact Pager: [phone]  weekends / holidays): Second Contact Pager: [phone]   Chief Concern: Shortness of breath  History of Present Illness:   Virginia Summers is a 81 year old female with past medical history of PE 2012 on Xarelto, asthma and possible COPD, HFpEF, hypothyroidism, type 2 diabetes, hypertension, complete heart block status post pacemaker placement 08/2021, CKD 3B, chronic hypoxia on 2-3 L at home, who presented to the emergency room for acute episode of shortness of breath.  Patient was in her usual state of health until the last few day.  States that she developed worsening shortness of breath and very severe this morning.  She denies coughing, increased sputum production or wheezing.  Patient has chronic chills but denies fever.  She states that she has a low-grade fever this morning measured by RN.   She states that her LE edema has improved in the last 3 days.  Denies weight gain, in fact she has lost some weight since her last admission.  No chest pain.  She has chronic orthopnea which has not changed.  She does not take Lasix at home.  She only takes chlorthalidone for blood pressure.  She denies any signs of bleeding except for light nose bleed.  She denies hematuria, hematochezia, melena or heartburn symptoms.  She has had a colonoscopy many years ago but never had an EGD.  Patient states that she developed hypoxic respiratory failure after her COVID infection last year.  She normally uses 2-3 L at home.  She was admitted in June 2023 for AKI, hyperkalemia and complete heart  block require CRT and pacemaker placement.  In the ED, patient required 8 L to maintain O2 sat.  Hemoglobin is low at 7.  Potassium 2.9 with creatinine 1.25.  CTA was negative for PE but show possible multifocal pneumonia with small bilateral effusions.  Patient was given 1 dose of IV Lasix 20 mg with breathing treatment.  Patient will be admitted for treatment of her acute respiratory failure.  Meds:  Current Meds  Medication Sig   acetaminophen (TYLENOL) 500 MG tablet Take 1,000 mg by mouth every 6 (six) hours as needed for moderate pain or headache.   albuterol (VENTOLIN HFA) 108 (90 Base) MCG/ACT inhaler Inhale 2 puffs into the lungs every 4 (four) hours as needed for wheezing or shortness of breath. For shortness of breath.   amLODipine (NORVASC) 10 MG tablet Take 1 tablet (10 mg total) by mouth daily.   Artificial Tear Ointment (DRY EYES OP) Apply 1 drop to eye 2 (two) times daily as needed (dry eyes).   budesonide-formoterol (SYMBICORT) 80-4.5 MCG/ACT inhaler Inhale 2 puffs into the lungs 2 (two) times daily.   chlorthalidone (HYGROTON) 25 MG tablet Take 1 tablet by mouth once daily (Patient taking differently: Take 25 mg by mouth daily.)   DULoxetine (CYMBALTA) 60 MG capsule Take 60 mg by mouth at bedtime.   fexofenadine (  ALLEGRA) 180 MG tablet Take 180 mg by mouth daily.   fluticasone (FLONASE) 50 MCG/ACT nasal spray Place 2 sprays into both nostrils daily as needed for allergies or rhinitis.   hydrALAZINE (APRESOLINE) 25 MG tablet TAKE 3 TABLETS BY MOUTH THREE TIMES DAILY APPT  REQD  FOR  REFILLS (Patient taking differently: Take 75 mg by mouth 3 (three) times daily.)   LANTUS SOLOSTAR 100 UNIT/ML Solostar Pen Inject 16 Units into the skin daily.   levothyroxine (SYNTHROID, LEVOTHROID) 25 MCG tablet TAKE 1 TABLET (25 MCG TOTAL) BY MOUTH DAILY. (Patient taking differently: Take 25 mcg by mouth daily before breakfast.)   rivaroxaban (XARELTO) 10 MG TABS tablet Take 1 tablet (10 mg total)  by mouth daily. Start 6/24 PM   traZODone (DESYREL) 50 MG tablet Take 50 mg by mouth at bedtime as needed for sleep.      Allergies: Allergies as of 10/29/2021 - Review Complete 10/29/2021  Allergen Reaction Noted   Symbicort [budesonide-formoterol fumarate] Other (See Comments) 10/29/2021   Penicillins Swelling and Rash 06/03/2010   Sulfa drugs cross reactors Swelling and Rash 06/03/2010   Past Medical History:  Diagnosis Date   Anticoagulant long-term use    Chest pain    Diabetes mellitus    Diverticulitis    DVT (deep venous thrombosis) (HCC)    BILATERAL, March 2012   GERD (gastroesophageal reflux disease)    HTN (hypertension)    Hypothyroidism    Pulmonary embolism ([place])     Family History:  Family History  Problem Relation Age of Onset   Transient ischemic attack Father    Coronary artery disease Father    Stroke Mother    Diabetes Mother    Diabetes Sister    Hypertension Daughter      Social History:  -Lives with her older daughter -Her physical functioning has been declined since her admission in June.  She does not go outside and mainly stays in her room due to her nasal cannula.  She is able to take care of herself.  Her daughter helps with medication management. -History of tobacco use but quit more than 40 years ago -Denies alcohol or drug use -PCP: Dr. Clovia Cuff  Review of Systems: A complete ROS was negative except as per HPI.   Physical Exam: Blood pressure (!) 158/57, pulse 76, temperature 99.1 F (37.3 C), temperature source Oral, resp. rate (!) 21, height 4\' 11"  (1.499 m), weight 81.2 kg, SpO2 93 %. Physical Exam Constitutional:      General: She is not in acute distress.    Appearance: She is not ill-appearing.  HENT:     Head: Normocephalic.     Mouth/Throat:     Mouth: Mucous membranes are moist.  Eyes:     General: No scleral icterus.       Right eye: No discharge.        Left eye: No discharge.     Conjunctiva/sclera:  Conjunctivae normal.  Cardiovascular:     Rate and Rhythm: Normal rate and regular rhythm.     Heart sounds: Normal heart sounds. No murmur heard.    Comments: No obvious JVD.  Trace bilateral LE edema Pulmonary:     Effort: Pulmonary effort is normal. No respiratory distress.     Breath sounds: No wheezing.     Comments: Crackle heard at bilateral bases and mid lungs.  No wheezing heard Abdominal:     General: Bowel sounds are normal. There is no distension.  Palpations: Abdomen is soft.     Tenderness: There is no abdominal tenderness.  Musculoskeletal:        General: Normal range of motion.     Cervical back: Normal range of motion.  Skin:    General: Skin is warm.     Coloration: Skin is not jaundiced.  Neurological:     General: No focal deficit present.     Mental Status: She is alert.  Psychiatric:        Mood and Affect: Mood normal.        Behavior: Behavior normal.      EKG: personally reviewed my interpretation is ectopic atrial rhythm with known LBBB   Assessment & Plan by Problem: Principal Problem:   Acute respiratory failure with hypoxia (HCC) Active Problems:   HTN (hypertension)   Hypothyroidism   COPD (chronic obstructive pulmonary disease) (HCC)   History of pulmonary embolus (PE)   Diabetes mellitus type 2 in obese (HCC)   Heart block AV complete (HCC)  Virginia Summers is a 81 year old female with past medical history of PE 2012 on Xarelto, asthma and possible COPD, HFpEF, hypothyroidism, type 2 diabetes, hypertension, heart block status post pacemaker placement, CKD 3B, chronic hypoxia on 2-3 L at home who was admitted for acute hypoxic respiratory failure secondary to community-acquired pneumonia and HFpEF exacerbation.  Acute on chronic hypoxic respiratory failure Community-acquired pneumonia HFpEF exacerbation Patient has a mixed picture of CAP and HFpEF exacerbation.  CTA did show evidence of multifocal pneumonia with small bilateral pleural  effusion.  PE was ruled out.  Low suspicion for COPD exacerbation.  Patient is requiring 8 L of O2 at this time. HFpEF exacerbation is on the differential.  However patient does not have worsening LE edema or weight gain or JVD.  She mainly has left-sided HF symptoms.  Repeat TTE to evaluate for heart function. Symptomatic anemia is on the differential but does not explain the pleural effusion.  Her Hgb is not very far off from baseline since June. -Continue ceftriaxone and azithromycin for CAP coverage.  -Obtain Legionella and strep pneumo antigen due to hyponatremia -Obtain MRSA nasal swab -Patient received 1 dose of IV 20 mg Lasix.  Monitor strict I/O.  Hold off on additional Lasix at this time. -Pending echocardiogram -Continue Symbicort and DuoNebs as needed.  Hold steroids due to low suspicion of COPD exacerbation.  Microcytic anemia Her baseline hemoglobin is around 7.6 - 7.8 in June.  However her microcytosis is new.  Patient denies any signs of GI bleeding. -Obtain reticulocyte count, ferritin and iron study -If confirmed IDA, patient may need an EGD and colonoscopy for work-up -Transfuse if Hgb < 7  CKD 3B Hypokalemia Hypomagnesium  Kidney function at baseline.  Hypokalemia in the setting of chlorthalidone. -Replete K and mag -Hold chlorthalidone at this time  COPD/asthma No signs of COPD exacerbation.  Patient never had a formal PFT done. -Continue Symbicort and DuoNebs as needed -May need outpatient PFT for confirmation  Complete heart block status post pacemaker Last evaluated in 09/2021 without any abnormality -Follow-up with cardiology as scheduled  Hypertension -Resume amlodipine and hydralazine -Hold chlorthalidone for now in the setting of hypokalemia  Hypothyroidism -Resume levothyroxine 25 mcg daily -Check TSH  Type 2 diabetes A1c of 7.6 in June.  She reports taking Lantus 8 units BID -Start Semglee 8 units QHS with SSI  History of pulmonary  embolism -Continue Xarelto  Full code Diet: Heart healthy IVF: N/A DVT: Xarelto  Dispo: Admit  patient to Inpatient with expected length of stay greater than 2 midnights.  Signed: Gaylan Gerold, DO 10/29/2021, 1:34 PM  Pager: [phone] After 5pm on weekdays and 1pm on weekends: On Call pager: [phone]

## 2021-10-29 NOTE — Progress Notes (Signed)
CBG 404. MD notified. Order given to give 5 units novolog.

## 2021-10-29 NOTE — ED Provider Notes (Signed)
[facility] EMERGENCY DEPARTMENT Provider Note   CSN: [id number] Arrival date & time: 10/29/21  0803     History  Chief Complaint  Patient presents with   Shortness of Breath    Marytza A Snavely is a 81 y.o. female with a history of intermittent complete heart block (s/p pacemaker), CKD stage IIIb, chronic hypoxemic respiratory failure, HFpEF with grade 1 diastolic dysfunction, bilateral PE (on eliquis), type 2 diabetes presenting for acute shortness of breath since yesterday. Patient reports it has gradually been worsening but was especially worse after last night. She has difficulty getting her breath in. She reports that she is chronically on 2 to 3 L of oxygen at home but has not been diagnosed with COPD, she does have Symbicort and albuterol inhalers as needed.  She does not need Symbicort every day due to intolerance of the jittery/anxious feelings afterwards, she did take it yesterday though.  She denies any chest pain, increased leg swelling, abdominal swelling or pain, difficulty urinating.   Shortness of Breath Associated symptoms: fever (subjective) and wheezing   Associated symptoms: no abdominal pain, no chest pain, no headaches and no vomiting       Home Medications Prior to Admission medications   Medication Sig Start Date End Date Taking? Authorizing Provider  acetaminophen (TYLENOL) 500 MG tablet Take 1,000 mg by mouth every 6 (six) hours as needed for moderate pain or headache.    [provider]  albuterol (VENTOLIN HFA) 108 (90 Base) MCG/ACT inhaler Inhale 2 puffs into the lungs every 4 (four) hours as needed for wheezing or shortness of breath. For shortness of breath.    [provider]  amLODipine (NORVASC) 10 MG tablet Take 1 tablet (10 mg total) by mouth daily. 11/25/20 08/27/21  Lanae Boast, MD  Artificial Tear Ointment (DRY EYES OP) Apply 1 drop to eye 2 (two) times daily as needed (dry eyes).    [provider]   budesonide-formoterol (SYMBICORT) 80-4.5 MCG/ACT inhaler Inhale 2 puffs into the lungs 2 (two) times daily.    [provider]  chlorthalidone (HYGROTON) 25 MG tablet Take 1 tablet by mouth once daily Patient taking differently: Take 25 mg by mouth daily. 07/13/21   Nahser, Deloris Ping, MD  DULoxetine (CYMBALTA) 60 MG capsule Take 60 mg by mouth at bedtime. 05/30/19   [provider]  fexofenadine (ALLEGRA) 180 MG tablet Take 180 mg by mouth daily.    [provider]  fluticasone (FLONASE) 50 MCG/ACT nasal spray Place 2 sprays into both nostrils daily as needed for allergies or rhinitis. 09/26/18   [provider]  glucose blood (ONETOUCH ULTRA) test strip USE 1 STRIP TO CHECK GLUCOSE TWICE DAILY OR  AS  DIRECTED. 11/19/20   [provider]  hydrALAZINE (APRESOLINE) 25 MG tablet TAKE 3 TABLETS BY MOUTH THREE TIMES DAILY APPT  REQD  FOR  REFILLS 10/11/21   Nahser, Deloris Ping, MD  levothyroxine (SYNTHROID, LEVOTHROID) 25 MCG tablet TAKE 1 TABLET (25 MCG TOTAL) BY MOUTH DAILY. Patient taking differently: Take 25 mcg by mouth daily before breakfast. 03/08/13   Nahser, Deloris Ping, MD  metFORMIN (GLUCOPHAGE-XR) 500 MG 24 hr tablet Take 500 mg by mouth daily with breakfast.    [provider]  oxyCODONE (OXY IR/ROXICODONE) 5 MG immediate release tablet Take 1 tablet (5 mg total) by mouth every 8 (eight) hours as needed for moderate pain, severe pain or breakthrough pain. 09/03/21   Standley Brooking, MD  rivaroxaban (  XARELTO) 10 MG TABS tablet Take 1 tablet (10 mg total) by mouth daily. Start 6/24 PM 09/03/21   Standley Brooking, MD  traZODone (DESYREL) 50 MG tablet Take 50 mg by mouth at bedtime as needed for sleep.  09/18/17   [provider]      Allergies    Penicillins and Sulfa drugs cross reactors    Review of Systems   Review of Systems  Constitutional:  Positive for activity change, fatigue and fever (subjective). Negative for appetite change.   HENT:  Positive for congestion, rhinorrhea and voice change.   Eyes:  Negative for visual disturbance.  Respiratory:  Positive for chest tightness, shortness of breath and wheezing.   Cardiovascular:  Negative for chest pain, palpitations and leg swelling.  Gastrointestinal:  Negative for abdominal distention, abdominal pain, constipation, diarrhea and vomiting.  Genitourinary:  Negative for decreased urine volume, difficulty urinating and frequency.  Musculoskeletal:  Negative for arthralgias.  Neurological:  Negative for dizziness, weakness and headaches.    Physical Exam Updated Vital Signs BP (!) 143/44   Pulse 75   Temp 99.1 F (37.3 C) (Oral)   Resp (!) 21   Ht 4\' 11"  (1.499 m)   Wt 81.2 kg   SpO2 94%   BMI 36.15 kg/m  Physical Exam Constitutional:      General: She is in acute distress (mild/moderate respiratory).     Appearance: She is obese.  HENT:     Head: Normocephalic and atraumatic.     Mouth/Throat:     Pharynx: Oropharynx is clear.     Comments: Mildly dry mouth Eyes:     Extraocular Movements: Extraocular movements intact.     Pupils: Pupils are equal, round, and reactive to light.  Cardiovascular:     Rate and Rhythm: Normal rate and regular rhythm.     Heart sounds: Murmur heard.  Pulmonary:     Effort: Tachypnea and respiratory distress present.     Breath sounds: Examination of the right-lower field reveals rhonchi. Examination of the left-lower field reveals rhonchi. Wheezing (initially audible but not auscultated on exam) and rhonchi present.  Chest:     Chest wall: No mass or tenderness.  Abdominal:     General: Bowel sounds are normal.     Palpations: Abdomen is soft. There is no mass.     Tenderness: There is no abdominal tenderness. There is no guarding.  Musculoskeletal:        General: Normal range of motion.     Cervical back: Normal range of motion and neck supple.     Right lower leg: Edema (trace) present.     Left lower leg: Edema  (trace) present.  Skin:    General: Skin is warm and dry.     Capillary Refill: Capillary refill takes less than 2 seconds.  Neurological:     General: No focal deficit present.     Mental Status: She is alert.     ED Results / Procedures / Treatments   Labs (all labs ordered are listed, but only abnormal results are displayed) Labs Reviewed  COMPREHENSIVE METABOLIC PANEL - Abnormal; Notable for the following components:      Result Value   Sodium 132 (*)    Potassium 2.9 (*)    Chloride 97 (*)    Glucose, Bld 325 (*)    BUN 26 (*)    Creatinine, Ser 1.25 (*)    Calcium 8.2 (*)    Albumin 2.9 (*)  GFR, Estimated 43 (*)    All other components within normal limits  CBC WITH DIFFERENTIAL/PLATELET - Abnormal; Notable for the following components:   RBC 3.22 (*)    Hemoglobin 7.0 (*)    HCT 23.7 (*)    MCV 73.6 (*)    MCH 21.7 (*)    MCHC 29.5 (*)    RDW 16.4 (*)    Abs Immature Granulocytes 0.09 (*)    All other components within normal limits  BRAIN NATRIURETIC PEPTIDE - Abnormal; Notable for the following components:   B Natriuretic Peptide 494.5 (*)    All other components within normal limits  MAGNESIUM - Abnormal; Notable for the following components:   Magnesium 1.6 (*)    All other components within normal limits  TROPONIN I (HIGH SENSITIVITY) - Abnormal; Notable for the following components:   Troponin I (High Sensitivity) 28 (*)    All other components within normal limits  TROPONIN I (HIGH SENSITIVITY) - Abnormal; Notable for the following components:   Troponin I (High Sensitivity) 29 (*)    All other components within normal limits  RESP PANEL BY RT-PCR (FLU A&B, COVID) [id number]    EKG EKG Interpretation  Date/Time:  Friday October 29 2021 08:28:15 EDT Ventricular Rate:  85 PR Interval:  136 QRS Duration: 147 QT Interval:  398 QTC Calculation: 474 R Axis:   -47 Text Interpretation: Sinus or ectopic atrial rhythm Left bundle branch block Artifact  Abnormal ECG Confirmed by Carmin Muskrat [phone]) on 10/29/2021 9:09:06 AM  Radiology CT Angio Chest PE W/Cm &/Or Wo Cm  Result Date: 10/29/2021 CLINICAL DATA:  Shortness of breath beginning yesterday. Patient is oxygen dependent. Possible pulmonary embolism. EXAM: CT ANGIOGRAPHY CHEST WITH CONTRAST TECHNIQUE: Multidetector CT imaging of the chest was performed using the standard protocol during bolus administration of intravenous contrast. Multiplanar CT image reconstructions and MIPs were obtained to evaluate the vascular anatomy. RADIATION DOSE REDUCTION: This exam was performed according to the departmental dose-optimization program which includes automated exposure control, adjustment of the mA and/or kV according to patient size and/or use of iterative reconstruction technique. CONTRAST:  26mL OMNIPAQUE IOHEXOL 350 MG/ML SOLN COMPARISON:  11/18/2020 FINDINGS: Cardiovascular: Stable cardiomegaly. Cardiac pacer over the left upper chest wall with pacer leads adequately position. Subtle calcified plaque over the left anterior descending and right coronary arteries. Thoracic aorta is normal in caliber. There is calcified plaque over the descending thoracic aorta. Pulmonary arterial system is adequately opacified and demonstrates no definite pulmonary emboli. Remaining vascular structures are unremarkable. Mediastinum/Nodes: 1.1 cm subcarinal lymph node. 1 cm right paratracheal lymph node. Findings likely reactive. No left hilar adenopathy. Remaining mediastinal structures are unremarkable. Lungs/Pleura: Lungs are adequately inflated demonstrate a patchy airspace process over the right lung most notable over the right lower lobe. Also less patchy opacification over the left upper lobe and left base. Associated bibasilar atelectasis and small bilateral pleural effusions. Findings likely due to multifocal pneumonia. Airways are normal. Upper Abdomen: Calcified plaque over the abdominal aorta. No acute findings.  Musculoskeletal: Degenerative changes of the spine. No focal abnormality. Review of the MIP images confirms the above findings. IMPRESSION: 1. No evidence of pulmonary embolism. 2. Patchy bilateral airspace process most prominent over the right lower lobe likely multifocal pneumonia. Small bilateral pleural effusions with associated bibasilar atelectasis. 3. Cardiomegaly with evidence of atherosclerotic coronary artery disease. 4. Aortic atherosclerosis. Aortic Atherosclerosis (ICD10-I70.0). Electronically Signed   By: Marin Olp M.D.   On: 10/29/2021 11:29   DG  Chest Port 1 View  Result Date: 10/29/2021 CLINICAL DATA:  Shortness of breath. EXAM: PORTABLE CHEST 1 VIEW COMPARISON:  09/01/2021 FINDINGS: 0854 hours. Low volume film. The cardio pericardial silhouette is enlarged. Diffuse interstitial opacity suggests edema. Bibasilar atelectasis or infiltrate noted without substantial pleural effusion. Left-sided permanent pacemaker evident Telemetry leads overlie the chest. IMPRESSION: 1. Low volume film with diffuse interstitial opacity suggesting edema. 2. Bibasilar atelectasis or infiltrate. Electronically Signed   By: Misty Stanley M.D.   On: 10/29/2021 09:17    Procedures Procedures   Medications Ordered in ED Medications  potassium chloride 10 mEq in 100 mL IVPB (10 mEq Intravenous New Bag/Given 10/29/21 1157)  cefTRIAXone (ROCEPHIN) 1 g in sodium chloride 0.9 % 100 mL IVPB (has no administration in time range)  azithromycin (ZITHROMAX) 500 mg in sodium chloride 0.9 % 250 mL IVPB (has no administration in time range)  magnesium sulfate IVPB 2 g 50 mL (has no administration in time range)  ipratropium-albuterol (DUONEB) 0.5-2.5 (3) MG/3ML nebulizer solution 3 mL (3 mLs Nebulization Given 10/29/21 0836)  potassium chloride SA (KLOR-CON M) CR tablet 40 mEq (40 mEq Oral Given 10/29/21 1013)  furosemide (LASIX) injection 20 mg (20 mg Intravenous Given 10/29/21 1152)  iohexol (OMNIPAQUE) 350 MG/ML  injection 75 mL (75 mLs Intravenous Contrast Given 10/29/21 1116)    ED Course/ Medical Decision Making/ A&P                           Medical Decision Making Amount and/or Complexity of Data Reviewed Labs: ordered. Radiology: ordered.  Risk Prescription drug management.    Jamari A Busic is a 81 y.o. female with a history of intermittent complete heart block (s/p pacemaker), CKD stage IIIb, chronic hypoxemic respiratory failure, HFpEF, bilateral PE, type 2 diabetes presenting for acute shortness of breath since yesterday. Differential includes COPD, CHF exacerbation, PE, MI, anxiety.  On examination patient appears to be a mixed of wheezing and possible fluid overload. Patient is requiring increased oxygen from baseline (required 5-6L during evaluation with baseline 2L). Will obtain COVID/Flu testing, CBC, CMP, BNP, and CXR.   Patient received albuterol via EMS, which has shown some improvement but given some audible wheezing, will also administer DuoNebs x1 while obtaining work-up.   Labs significant for hemoglobin of 7, most recently was 7.8 2 months prior. Patient denies any acute bleeding. CMP with stable creatinine compared to prior and hypokalemia. Ordered 1 dose of 40mg  Kdur and IV 47mEq. Mag level ws 1.6, 2g IV repletion ordered.  Given mixed unclear picture as the heart failure doesn't appear to be severe enough at this time to cause this much increase in oxygen demand, do feel it is worth getting a CTA PE study, though note that patient has hx of PE and is currently on Eliquis. Will also begin diuresis with IV Lasix 20mg  as patient reports history of difficulty with tolerating the medication.   CTA PE was negative for any acute PE but did note concern for multifocal pneumonia, patient was started on Azithromycin and Rocephin. At this time, feel that patient is appropriate for admission to hospital service for further treatment and monitoring. Discussed care with Internal Medicine  Teaching Service.   Final Clinical Impression(s) / ED Diagnoses Final diagnoses:  Acute hypoxemic respiratory failure ([place])  Community acquired pneumonia of right lower lobe of lung  Hypokalemia  Hypomagnesemia    Rx / DC Orders ED Discharge Orders  None         Rise Patience, DO 10/29/21 1223    Carmin Muskrat, MD 10/29/21 1550

## 2021-10-29 NOTE — ED Notes (Signed)
Patient transported to Ultrasound 

## 2021-10-29 NOTE — ED Notes (Signed)
ED TO INPATIENT HANDOFF REPORT  ED Nurse Name and Phone #: [phone]  S Name/Age/Gender Virginia Summers 81 y.o. female Room/Bed: 022C/022C  Code Status   Code Status: Full Code  Home/SNF/Other Home Patient oriented to: self, place, time, and situation Is this baseline? Yes   Triage Complete: Triage complete  Chief Complaint Acute respiratory failure with hypoxia (HCC) [J96.01]  Triage Note Pt bib ems from home c/o sob starting 8/17. Pt baseline is 2-3L O2. Pt stated she tried to use inhaler but it did not improve breathing.   HX COPD and Asthma Pacemaker  HD pt  EMS stated diminished lung sounds bilaterally upper and lower.   10 mg albuterol  125 mg Solumedrol   BP 170/80 CBG 266 HR 87 3L @ 90%   Allergies Allergies  Allergen Reactions   Symbicort [Budesonide-Formoterol Fumarate] Other (See Comments)    Shaking/ jittery feeling   Penicillins Swelling and Rash    Did it involve swelling of the face/tongue/throat, SOB, or low BP? No Did it involve sudden or severe rash/hives, skin peeling, or any reaction on the inside of your mouth or nose? No Did you need to seek medical attention at a hospital or doctor's office? No When did it last happen?    childhood   If all above answers are "NO", may proceed with cephalosporin use.   Sulfa Drugs Cross Reactors Swelling and Rash    Level of Care/Admitting Diagnosis ED Disposition     ED Disposition  Admit   Condition  --   Comment  Hospital Area: [facility] [[id number]]  Level of Care: Progressive [102]  Admit to Progressive based on following criteria: RESPIRATORY PROBLEMS hypoxemic/hypercapnic respiratory failure that is responsive to NIPPV (BiPAP) or High Flow Nasal Cannula (6-80 lpm). Frequent assessment/intervention, no > Q2 hrs < Q4 hrs, to maintain oxygenation and pulmonary hygiene.  May admit patient to [facility] or [facility] if equivalent level of care is available:: No  Covid Evaluation:  Confirmed COVID Negative  Diagnosis: Acute respiratory failure with hypoxia [facility]) [[id number]]  Admitting Physician: Silvio Pate  Attending Physician: Gust Rung [[id number]]  Certification:: I certify this patient will need inpatient services for at least 2 midnights  Estimated Length of Stay: 2          B Medical/Surgery History Past Medical History:  Diagnosis Date   Anticoagulant long-term use    Chest pain    Diabetes mellitus    Diverticulitis    DVT (deep venous thrombosis) (HCC)    BILATERAL, March 2012   GERD (gastroesophageal reflux disease)    HTN (hypertension)    Hypothyroidism    Pulmonary embolism (HCC)    Past Surgical History:  Procedure Laterality Date   APPENDECTOMY  AGE 40   BACK SURGERY  03/14/1998   DISC   PACEMAKER IMPLANT  08/31/2021   St. Jude dual chamber; DDD 60-120   PACEMAKER IMPLANT N/A 08/31/2021   Procedure: PACEMAKER IMPLANT;  Surgeon: Marinus Maw, MD;  Location: [facility] INVASIVE CV LAB;  Service: Cardiovascular;  Laterality: N/A;   PARTIAL HYSTERECTOMY  AGE 56's APPROX.   SECONDARY INTRAOCULAR LENSE IMPLANTATION  2005,2006   OU   SHOULDER SURGERY  5-6 YRS OLD   PINS     A IV Location/Drains/Wounds Patient Lines/Drains/Airways Status     Active Line/Drains/Airways     Name Placement date Placement time Site Days   Peripheral IV 10/29/21 20 G Left Antecubital 10/29/21  1191  Antecubital  less than 1   Peripheral IV 10/29/21 18 G Anterior;Proximal;Right Forearm 10/29/21  1359  Forearm  less than 1   External Urinary Catheter 10/29/21  1158  --  less than 1            Intake/Output Last 24 hours  Intake/Output Summary (Last 24 hours) at 10/29/2021 1548 Last data filed at 10/29/2021 1537 Gross per 24 hour  Intake 500 ml  Output --  Net 500 ml    Labs/Imaging Results for orders placed or performed during the hospital encounter of 10/29/21 (from the past 48 hour(s))  Resp Panel by RT-PCR (Flu A&B, Covid) Anterior  Nasal Swab     Status: None   Collection Time: 10/29/21  8:18 AM   Specimen: Anterior Nasal Swab  Result Value Ref Range   SARS Coronavirus 2 by RT PCR NEGATIVE NEGATIVE    Comment: (NOTE) SARS-CoV-2 target nucleic acids are NOT DETECTED.  The SARS-CoV-2 RNA is generally detectable in upper respiratory specimens during the acute phase of infection. The lowest concentration of SARS-CoV-2 viral copies this assay can detect is 138 copies/mL. A negative result does not preclude SARS-Cov-2 infection and should not be used as the sole basis for treatment or other patient management decisions. A negative result may occur with  improper specimen collection/handling, submission of specimen other than nasopharyngeal swab, presence of viral mutation(s) within the areas targeted by this assay, and inadequate number of viral copies(<138 copies/mL). A negative result must be combined with clinical observations, patient history, and epidemiological information. The expected result is Negative.  Fact Sheet for Patients:  BloggerCourse.com  Fact Sheet for Healthcare Providers:  SeriousBroker.it  This test is no t yet approved or cleared by the [place] FDA and  has been authorized for detection and/or diagnosis of SARS-CoV-2 by FDA under an Emergency Use Authorization (EUA). This EUA will remain  in effect (meaning this test can be used) for the duration of the COVID-19 declaration under Section 564(b)(1) of the Act, 21 U.S.C.section 360bbb-3(b)(1), unless the authorization is terminated  or revoked sooner.       Influenza A by PCR NEGATIVE NEGATIVE   Influenza B by PCR NEGATIVE NEGATIVE    Comment: (NOTE) The Xpert Xpress SARS-CoV-2/FLU/RSV plus assay is intended as an aid in the diagnosis of influenza from Nasopharyngeal swab specimens and should not be used as a sole basis for treatment. Nasal washings and aspirates are unacceptable  for Xpert Xpress SARS-CoV-2/FLU/RSV testing.  Fact Sheet for Patients: BloggerCourse.com  Fact Sheet for Healthcare Providers: SeriousBroker.it  This test is not yet approved or cleared by the [place] FDA and has been authorized for detection and/or diagnosis of SARS-CoV-2 by FDA under an Emergency Use Authorization (EUA). This EUA will remain in effect (meaning this test can be used) for the duration of the COVID-19 declaration under Section 564(b)(1) of the Act, 21 U.S.C. section 360bbb-3(b)(1), unless the authorization is terminated or revoked.  Performed at [facility], [address], [place], [place] [address]   Comprehensive metabolic panel     Status: Abnormal   Collection Time: 10/29/21  8:28 AM  Result Value Ref Range   Sodium 132 (L) 135 - 145 mmol/L   Potassium 2.9 (L) 3.5 - 5.1 mmol/L   Chloride 97 (L) 98 - 111 mmol/L   CO2 24 22 - 32 mmol/L   Glucose, Bld 325 (H) 70 - 99 mg/dL    Comment: Glucose reference range applies only to  samples taken after fasting for at least 8 hours.   BUN 26 (H) 8 - 23 mg/dL   Creatinine, Ser 1.19 (H) 0.44 - 1.00 mg/dL   Calcium 8.2 (L) 8.9 - 10.3 mg/dL   Total Protein 6.9 6.5 - 8.1 g/dL   Albumin 2.9 (L) 3.5 - 5.0 g/dL   AST 24 15 - 41 U/L   ALT 12 0 - 44 U/L   Alkaline Phosphatase 53 38 - 126 U/L   Total Bilirubin 0.9 0.3 - 1.2 mg/dL   GFR, Estimated 43 (L) >60 mL/min    Comment: (NOTE) Calculated using the CKD-EPI Creatinine Equation (2021)    Anion gap 11 5 - 15    Comment: Performed at [facility], [address], [place], [place] [address]  Troponin I (High Sensitivity)     Status: Abnormal   Collection Time: 10/29/21  8:28 AM  Result Value Ref Range   Troponin I (High Sensitivity) 28 (H) <18 ng/L    Comment: (NOTE) Elevated high sensitivity troponin I (hsTnI) values and significant  changes across serial measurements may suggest ACS but many other   chronic and acute conditions are known to elevate hsTnI results.  Refer to the "Links" section for chest pain algorithms and additional  guidance. Performed at [facility], [address] [place], [place] [address]   CBC with Differential     Status: Abnormal   Collection Time: 10/29/21  8:28 AM  Result Value Ref Range   WBC 7.2 4.0 - 10.5 K/uL   RBC 3.22 (L) 3.87 - 5.11 MIL/uL   Hemoglobin 7.0 (L) 12.0 - 15.0 g/dL    Comment: Reticulocyte Hemoglobin testing may be clinically indicated, consider ordering this additional test [id number]    HCT 23.7 (L) 36.0 - 46.0 %   MCV 73.6 (L) 80.0 - 100.0 fL   MCH 21.7 (L) 26.0 - 34.0 pg   MCHC 29.5 (L) 30.0 - 36.0 g/dL   RDW 70.2 (H) 63.7 - 85.8 %   Platelets 209 150 - 400 K/uL   nRBC 0.0 0.0 - 0.2 %   Neutrophils Relative % 72 %   Neutro Abs 5.2 1.7 - 7.7 K/uL   Lymphocytes Relative 21 %   Lymphs Abs 1.5 0.7 - 4.0 K/uL   Monocytes Relative 6 %   Monocytes Absolute 0.4 0.1 - 1.0 K/uL   Eosinophils Relative 0 %   Eosinophils Absolute 0.0 0.0 - 0.5 K/uL   Basophils Relative 0 %   Basophils Absolute 0.0 0.0 - 0.1 K/uL   Immature Granulocytes 1 %   Abs Immature Granulocytes 0.09 (H) 0.00 - 0.07 K/uL    Comment: Performed at [facility], [address], [place], [place] [address]  Brain natriuretic peptide     Status: Abnormal   Collection Time: 10/29/21  8:28 AM  Result Value Ref Range   B Natriuretic Peptide 494.5 (H) 0.0 - 100.0 pg/mL    Comment: Performed at [facility], [address] [place], [place] [address]  Troponin I (High Sensitivity)     Status: Abnormal   Collection Time: 10/29/21 10:18 AM  Result Value Ref Range   Troponin I (High Sensitivity) 29 (H) <18 ng/L    Comment: (NOTE) Elevated high sensitivity troponin I (hsTnI) values and significant  changes across serial measurements may suggest ACS but many other  chronic and acute conditions are known to elevate hsTnI results.  Refer to the "Links"  section for  chest pain algorithms and additional  guidance. Performed at [facility], [address] [place], [place] [address]   Magnesium     Status: Abnormal   Collection Time: 10/29/21 10:18 AM  Result Value Ref Range   Magnesium 1.6 (L) 1.7 - 2.4 mg/dL    Comment: Performed at [facility], [address] [place], [place] [address]  TSH     Status: None   Collection Time: 10/29/21  1:35 PM  Result Value Ref Range   TSH 0.357 0.350 - 4.500 uIU/mL    Comment: Performed by a 3rd Generation assay with a functional sensitivity of <=0.01 uIU/mL. Performed at [facility], [address], [place], [place] [address]   Reticulocytes     Status: Abnormal   Collection Time: 10/29/21  1:35 PM  Result Value Ref Range   Retic Ct Pct 2.1 0.4 - 3.1 %   RBC. 3.06 (L) 3.87 - 5.11 MIL/uL   Retic Count, Absolute 63.0 19.0 - 186.0 K/uL   Immature Retic Fract 28.9 (H) 2.3 - 15.9 %    Comment: Performed at [facility], [address], [place], [place] [address]  Ferritin     Status: None   Collection Time: 10/29/21  1:35 PM  Result Value Ref Range   Ferritin 51 11 - 307 ng/mL    Comment: Performed at [facility], [address] [place], [place] [address]  Iron and TIBC     Status: Abnormal   Collection Time: 10/29/21  1:35 PM  Result Value Ref Range   Iron 14 (L) 28 - 170 ug/dL   TIBC 323 557 - 322 ug/dL   Saturation Ratios 4 (L) 10.4 - 31.8 %   UIBC 371 ug/dL    Comment: Performed at [facility], [address], [place], [place] [address]   CT Angio Chest PE W/Cm &/Or Wo Cm  Result Date: 10/29/2021 CLINICAL DATA:  Shortness of breath beginning yesterday. Patient is oxygen dependent. Possible pulmonary embolism. EXAM: CT ANGIOGRAPHY CHEST WITH CONTRAST TECHNIQUE: Multidetector CT imaging of the chest was performed using the standard protocol during bolus administration of intravenous contrast. Multiplanar CT image reconstructions and MIPs were  obtained to evaluate the vascular anatomy. RADIATION DOSE REDUCTION: This exam was performed according to the departmental dose-optimization program which includes automated exposure control, adjustment of the mA and/or kV according to patient size and/or use of iterative reconstruction technique. CONTRAST:  55mL OMNIPAQUE IOHEXOL 350 MG/ML SOLN COMPARISON:  11/18/2020 FINDINGS: Cardiovascular: Stable cardiomegaly. Cardiac pacer over the left upper chest wall with pacer leads adequately position. Subtle calcified plaque over the left anterior descending and right coronary arteries. Thoracic aorta is normal in caliber. There is calcified plaque over the descending thoracic aorta. Pulmonary arterial system is adequately opacified and demonstrates no definite pulmonary emboli. Remaining vascular structures are unremarkable. Mediastinum/Nodes: 1.1 cm subcarinal lymph node. 1 cm right paratracheal lymph node. Findings likely reactive. No left hilar adenopathy. Remaining mediastinal structures are unremarkable. Lungs/Pleura: Lungs are adequately inflated demonstrate a patchy airspace process over the right lung most notable over the right lower lobe. Also less patchy opacification over the left upper lobe and left base. Associated bibasilar atelectasis and small bilateral pleural effusions. Findings likely due to multifocal pneumonia. Airways are normal. Upper Abdomen: Calcified plaque over the abdominal aorta. No acute findings. Musculoskeletal: Degenerative changes of the spine. No focal abnormality. Review of the MIP images confirms the above findings.  IMPRESSION: 1. No evidence of pulmonary embolism. 2. Patchy bilateral airspace process most prominent over the right lower lobe likely multifocal pneumonia. Small bilateral pleural effusions with associated bibasilar atelectasis. 3. Cardiomegaly with evidence of atherosclerotic coronary artery disease. 4. Aortic atherosclerosis. Aortic Atherosclerosis (ICD10-I70.0).  Electronically Signed   By: Elberta Fortisaniel  Boyle M.D.   On: 10/29/2021 11:29   DG Chest Port 1 View  Result Date: 10/29/2021 CLINICAL DATA:  Shortness of breath. EXAM: PORTABLE CHEST 1 VIEW COMPARISON:  09/01/2021 FINDINGS: 0854 hours. Low volume film. The cardio pericardial silhouette is enlarged. Diffuse interstitial opacity suggests edema. Bibasilar atelectasis or infiltrate noted without substantial pleural effusion. Left-sided permanent pacemaker evident Telemetry leads overlie the chest. IMPRESSION: 1. Low volume film with diffuse interstitial opacity suggesting edema. 2. Bibasilar atelectasis or infiltrate. Electronically Signed   By: Kennith CenterEric  Mansell M.D.   On: 10/29/2021 09:17    Pending Labs Unresulted Labs (From admission, onward)     Start     Ordered   10/30/21 0500  Basic metabolic panel  Tomorrow morning,   R        10/29/21 1312   10/30/21 0500  CBC  Tomorrow morning,   R        10/29/21 1312   10/30/21 0500  Magnesium  Tomorrow morning,   R        10/29/21 1344   10/29/21 1319  Legionella Pneumophila Serogp 1 Ur Ag  Once,   R        10/29/21 1318   10/29/21 1319  Strep pneumoniae urinary antigen  Once,   R        10/29/21 1318   10/29/21 1314  MRSA Next Gen by PCR, Nasal  Once,   R        10/29/21 1313            Vitals/Pain Today's Vitals   10/29/21 1409 10/29/21 1412 10/29/21 1454 10/29/21 1530  BP:  137/66 (!) 148/61 (!) 157/61  Pulse:  72  67  Resp:  20  (!) 21  Temp: 98.8 F (37.1 C)     TempSrc: Oral     SpO2:  94%  94%  Weight:      Height:      PainSc:        Isolation Precautions No active isolations  Medications Medications  polyethylene glycol (MIRALAX / GLYCOLAX) packet 17 g (has no administration in time range)  cefTRIAXone (ROCEPHIN) 1 g in sodium chloride 0.9 % 100 mL IVPB (has no administration in time range)  azithromycin (ZITHROMAX) 500 mg in sodium chloride 0.9 % 250 mL IVPB (has no administration in time range)  fluticasone  furoate-vilanterol (BREO ELLIPTA) 100-25 MCG/ACT 1 puff (has no administration in time range)  ipratropium-albuterol (DUONEB) 0.5-2.5 (3) MG/3ML nebulizer solution 3 mL (has no administration in time range)  amLODipine (NORVASC) tablet 10 mg (10 mg Oral Given 10/29/21 1454)  DULoxetine (CYMBALTA) DR capsule 60 mg (has no administration in time range)  rivaroxaban (XARELTO) tablet 10 mg (has no administration in time range)  insulin aspart (novoLOG) injection 0-15 Units (has no administration in time range)  insulin detemir (LEVEMIR) injection 8 Units (has no administration in time range)  potassium chloride SA (KLOR-CON M) CR tablet 40 mEq (has no administration in time range)  hydrALAZINE (APRESOLINE) tablet 75 mg (has no administration in time range)  levothyroxine (SYNTHROID) tablet 25 mcg (has no administration in time range)  ipratropium-albuterol (DUONEB) 0.5-2.5 (3) MG/3ML nebulizer solution 3  mL (3 mLs Nebulization Given 10/29/21 0836)  potassium chloride SA (KLOR-CON M) CR tablet 40 mEq (40 mEq Oral Given 10/29/21 1013)  potassium chloride 10 mEq in 100 mL IVPB (10 mEq Intravenous New Bag/Given 10/29/21 1402)  furosemide (LASIX) injection 20 mg (20 mg Intravenous Given 10/29/21 1152)  iohexol (OMNIPAQUE) 350 MG/ML injection 75 mL (75 mLs Intravenous Contrast Given 10/29/21 1116)  cefTRIAXone (ROCEPHIN) 1 g in sodium chloride 0.9 % 100 mL IVPB (0 g Intravenous Stopped 10/29/21 1450)  azithromycin (ZITHROMAX) 500 mg in sodium chloride 0.9 % 250 mL IVPB (0 mg Intravenous Stopped 10/29/21 1537)  magnesium sulfate IVPB 2 g 50 mL (0 g Intravenous Stopped 10/29/21 1537)    Mobility walks with person assist Low fall risk   Focused Assessments Pulmonary Assessment Handoff:  Lung sounds: Bilateral Breath Sounds: Diminished L Breath Sounds: Diminished R Breath Sounds: Diminished O2 Device: Nasal Cannula O2 Flow Rate (L/min): 6 L/min    R Recommendations: See Admitting Provider Note  Report  given to:   Additional Notes:

## 2021-10-29 NOTE — ED Notes (Signed)
RN notified EDP about pt getting sip of water.

## 2021-10-29 NOTE — ED Notes (Signed)
Daughter, Clydie Braun, stated that pt had an emergency dialysis two months ago which is when a pacemaker was determined to be placed.

## 2021-10-30 DIAGNOSIS — J189 Pneumonia, unspecified organism: Secondary | ICD-10-CM | POA: Diagnosis not present

## 2021-10-30 DIAGNOSIS — E876 Hypokalemia: Secondary | ICD-10-CM

## 2021-10-30 DIAGNOSIS — I5033 Acute on chronic diastolic (congestive) heart failure: Secondary | ICD-10-CM | POA: Diagnosis not present

## 2021-10-30 DIAGNOSIS — E1165 Type 2 diabetes mellitus with hyperglycemia: Secondary | ICD-10-CM

## 2021-10-30 DIAGNOSIS — I130 Hypertensive heart and chronic kidney disease with heart failure and stage 1 through stage 4 chronic kidney disease, or unspecified chronic kidney disease: Secondary | ICD-10-CM | POA: Diagnosis not present

## 2021-10-30 DIAGNOSIS — J9601 Acute respiratory failure with hypoxia: Secondary | ICD-10-CM | POA: Diagnosis not present

## 2021-10-30 DIAGNOSIS — E1122 Type 2 diabetes mellitus with diabetic chronic kidney disease: Secondary | ICD-10-CM

## 2021-10-30 DIAGNOSIS — N1832 Chronic kidney disease, stage 3b: Secondary | ICD-10-CM

## 2021-10-30 LAB — GLUCOSE, CAPILLARY
Glucose-Capillary: 308 mg/dL — ABNORMAL HIGH (ref 70–99)
Glucose-Capillary: 205 mg/dL — ABNORMAL HIGH (ref 70–99)
Glucose-Capillary: 137 mg/dL — ABNORMAL HIGH (ref 70–99)
Glucose-Capillary: 100 mg/dL — ABNORMAL HIGH (ref 70–99)

## 2021-10-30 LAB — MAGNESIUM: Magnesium: 2.4 mg/dL (ref 1.7–2.4)

## 2021-10-30 LAB — BASIC METABOLIC PANEL
Anion gap: 10 (ref 5–15)
BUN: 44 mg/dL — ABNORMAL HIGH (ref 8–23)
CO2: 24 mmol/L (ref 22–32)
Calcium: 8.3 mg/dL — ABNORMAL LOW (ref 8.9–10.3)
Chloride: 97 mmol/L — ABNORMAL LOW (ref 98–111)
Creatinine, Ser: 1.36 mg/dL — ABNORMAL HIGH (ref 0.44–1.00)
GFR, Estimated: 39 mL/min — ABNORMAL LOW (ref >60)
Glucose, Bld: 383 mg/dL — ABNORMAL HIGH (ref 70–99)
Potassium: 4.9 mmol/L (ref 3.5–5.1)
Sodium: 131 mmol/L — ABNORMAL LOW (ref 135–145)

## 2021-10-30 LAB — CBC
HCT: 20.9 % — ABNORMAL LOW (ref 36.0–46.0)
Hemoglobin: 6.2 g/dL — CL (ref 12.0–15.0)
MCH: 21.5 pg — ABNORMAL LOW (ref 26.0–34.0)
MCHC: 29.7 g/dL — ABNORMAL LOW (ref 30.0–36.0)
MCV: 72.6 fL — ABNORMAL LOW (ref 80.0–100.0)
Platelets: 185 10*3/uL (ref 150–400)
RBC: 2.88 MIL/uL — ABNORMAL LOW (ref 3.87–5.11)
RDW: 16.3 % — ABNORMAL HIGH (ref 11.5–15.5)
WBC: 9.6 10*3/uL (ref 4.0–10.5)
nRBC: 0.2 % (ref 0.0–0.2)

## 2021-10-30 LAB — HEMOGLOBIN AND HEMATOCRIT, BLOOD
HCT: 25.4 % — ABNORMAL LOW (ref 36.0–46.0)
Hemoglobin: 7.9 g/dL — ABNORMAL LOW (ref 12.0–15.0)

## 2021-10-30 LAB — PREPARE RBC (CROSSMATCH)

## 2021-10-30 LAB — ABO/RH: ABO/RH(D): A POS

## 2021-10-30 MED ORDER — LEVOTHYROXINE SODIUM 25 MCG PO TABS
25.0000 ug | ORAL_TABLET | Freq: Every day | ORAL | Status: DC
  Administered 2021-10-30 – 2021-11-08 (×9): 25 ug via ORAL
  Filled 2021-10-30 (×9): qty 1

## 2021-10-30 MED ORDER — HYDROXYZINE HCL 10 MG/5ML PO SYRP
10.0000 mg | ORAL_SOLUTION | Freq: Once | ORAL | Status: AC
  Administered 2021-10-30: 10 mg via ORAL
  Filled 2021-10-30: qty 5

## 2021-10-30 MED ORDER — RAMELTEON 8 MG PO TABS
8.0000 mg | ORAL_TABLET | Freq: Every day | ORAL | Status: DC
  Administered 2021-10-30 – 2021-11-02 (×3): 8 mg via ORAL
  Filled 2021-10-30 (×7): qty 1

## 2021-10-30 MED ORDER — MENTHOL 3 MG MT LOZG
1.0000 | LOZENGE | OROMUCOSAL | Status: DC | PRN
Start: 2021-10-30 — End: 2021-11-08
  Administered 2021-10-30: 3 mg via ORAL
  Filled 2021-10-30: qty 9

## 2021-10-30 MED ORDER — INSULIN ASPART 100 UNIT/ML IJ SOLN
0.0000 [IU] | Freq: Three times a day (TID) | INTRAMUSCULAR | Status: DC
  Administered 2021-10-30: 5 [IU] via SUBCUTANEOUS
  Administered 2021-10-30: 11 [IU] via SUBCUTANEOUS
  Administered 2021-10-30 – 2021-10-31 (×2): 2 [IU] via SUBCUTANEOUS
  Administered 2021-10-31 – 2021-11-01 (×3): 3 [IU] via SUBCUTANEOUS
  Administered 2021-11-02: 8 [IU] via SUBCUTANEOUS
  Administered 2021-11-02: 5 [IU] via SUBCUTANEOUS
  Administered 2021-11-02: 2 [IU] via SUBCUTANEOUS
  Administered 2021-11-03: 5 [IU] via SUBCUTANEOUS
  Administered 2021-11-03: 3 [IU] via SUBCUTANEOUS
  Administered 2021-11-03: 2 [IU] via SUBCUTANEOUS
  Administered 2021-11-04: 3 [IU] via SUBCUTANEOUS
  Administered 2021-11-04: 8 [IU] via SUBCUTANEOUS
  Administered 2021-11-05: 5 [IU] via SUBCUTANEOUS
  Administered 2021-11-05 – 2021-11-06 (×3): 2 [IU] via SUBCUTANEOUS
  Administered 2021-11-07: 5 [IU] via SUBCUTANEOUS
  Administered 2021-11-07: 2 [IU] via SUBCUTANEOUS
  Administered 2021-11-08: 3 [IU] via SUBCUTANEOUS
  Administered 2021-11-08: 2 [IU] via SUBCUTANEOUS

## 2021-10-30 MED ORDER — INSULIN DETEMIR 100 UNIT/ML ~~LOC~~ SOLN
20.0000 [IU] | Freq: Every day | SUBCUTANEOUS | Status: DC
  Administered 2021-10-31 – 2021-11-05 (×5): 20 [IU] via SUBCUTANEOUS
  Filled 2021-10-30 (×8): qty 0.2

## 2021-10-30 MED ORDER — PHENOL 1.4 % MT LIQD
1.0000 | OROMUCOSAL | Status: DC | PRN
  Administered 2021-10-30 – 2021-11-02 (×4): 1 via OROMUCOSAL
  Filled 2021-10-30: qty 177

## 2021-10-30 MED ORDER — SODIUM CHLORIDE 0.9% IV SOLUTION: Freq: Once | INTRAVENOUS | Status: AC

## 2021-10-30 MED ORDER — ACETAMINOPHEN 325 MG PO TABS
650.0000 mg | ORAL_TABLET | Freq: Four times a day (QID) | ORAL | Status: DC | PRN
  Administered 2021-10-30 – 2021-11-06 (×8): 650 mg via ORAL
  Filled 2021-10-30 (×7): qty 2

## 2021-10-30 MED ORDER — INSULIN ASPART 100 UNIT/ML IJ SOLN
5.0000 [IU] | Freq: Three times a day (TID) | INTRAMUSCULAR | Status: DC
  Administered 2021-10-30 – 2021-11-08 (×16): 5 [IU] via SUBCUTANEOUS

## 2021-10-30 NOTE — Progress Notes (Addendum)
HD#1 Subjective:   Summary: Virginia Summers is a 81 year old female with PMH of PE 2012 on Xarelto, asthma and possible COPD, HFpEF, hypothyroidism, T2DM, HTN, 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  Overnight Events: none  Virginia Summers reports having a productive cough. She is now on 4L O2.  Reports history of GERD, treated with OTC medication. She recalls remote history of a stomach ulcer and was supposed to have EGD but did not follow up. Did note darkened stools possibly after starting xarelto. No bright red stools. Denies fever, chills, nausea or vomiting.  Objective:  Vital signs in last 24 hours: Vitals:   10/29/21 2100 10/29/21 2130 10/29/21 2200 10/30/21 0344  BP:    (!) 165/58  Pulse:      Resp:    20  Temp:    98 F (36.7 C)  TempSrc:    Oral  SpO2: 92% 91% 91% 99%  Weight:    80.3 kg  Height:       Supplemental O2: Nasal Cannula SpO2: 95 % O2 Flow Rate (L/min): 4 L/min   Physical Exam:  Constitutional: alert, laying in bed, in no acute distress HENT: normocephalic atraumatic Neck: supple Cardiovascular: regular rate and rhythm Pulmonary/Chest: normal work of breathing with 4L, mild expiratory wheezing at lung bases MSK: normal bulk and tone Neurological: alert & oriented x 3 Skin: warm and dry Psych: normal mood and behavior  Filed Weights   10/29/21 0820 10/30/21 0344  Weight: 81.2 kg 80.3 kg     Intake/Output Summary (Last 24 hours) at 10/30/2021 0702 Last data filed at 10/29/2021 2209 Gross per 24 hour  Intake 840 ml  Output --  Net 840 ml   Net IO Since Admission: 840 mL [10/30/21 0702]  Pertinent Labs:    Latest Ref Rng & Units 10/30/2021    3:04 AM 10/29/2021    8:28 AM 09/01/2021   12:56 AM  CBC  WBC 4.0 - 10.5 K/uL 9.6  7.2  8.1   Hemoglobin 12.0 - 15.0  g/dL 6.2  C 7.0  7.8   Hematocrit 36.0 - 46.0 % 20.9  23.7  25.7   Platelets 150 - 400 K/uL 185  209  232     C Corrected result       Latest Ref Rng & Units 10/30/2021    3:04 AM 10/29/2021    8:28 AM 09/03/2021   12:25 AM  CMP  Glucose 70 - 99 mg/dL 814  481  856   BUN 8 - 23 mg/dL 44  26  28   Creatinine 0.44 - 1.00 mg/dL 3.14  9.70  2.63   Sodium 135 - 145 mmol/L 131  132  133   Potassium 3.5 - 5.1 mmol/L 4.9  2.9  4.6   Chloride 98 - 111 mmol/L 97  97  103   CO2 22 - 32 mmol/L 24  24  26    Calcium 8.9 - 10.3 mg/dL 8.3  8.2  8.4   Total Protein 6.5 - 8.1 g/dL  6.9    Total Bilirubin 0.3 - 1.2 mg/dL  0.9    Alkaline Phos 38 - 126 U/L  53    AST 15 - 41 U/L  24    ALT 0 - 44 U/L  12      Imaging: ECHOCARDIOGRAM  LIMITED  Result Date: 10/29/2021    ECHOCARDIOGRAM LIMITED REPORT   Patient Name:   Virginia Summers Date of Exam: 10/29/2021 Medical Rec #:  [id number]    Height:       59.0 in Accession #:    [id number]   Weight:       179.0 lb Date of Birth:  1941/01/31   BSA:          1.759 m Patient Age:    80 years     BP:           148/61 mmHg Patient Gender: F            HR:           69 bpm. Exam Location:  Inpatient Procedure: Limited Echo, Limited Color Doppler and Cardiac Doppler Indications:    Respiratory distress  History:        Patient has prior history of Echocardiogram examinations.                 Pacemaker, COPD; Risk Factors:Diabetes.  Sonographer:    Delcie Roch RDCS Referring Phys: [id number] ERIK C HOFFMAN IMPRESSIONS  1. Left ventricular ejection fraction, by estimation, is 60 to 65%. The left ventricle has normal function. The left ventricle has no regional wall motion abnormalities.  2. Right ventricular systolic function is normal. The right ventricular size is normal. There is moderately elevated pulmonary artery systolic pressure.  3. Trivial mitral valve regurgitation.  4. Tricuspid valve regurgitation is mild to moderate.  5. Aortic valve regurgitation is not  visualized.  6. The inferior vena cava is normal in size with greater than 50% respiratory variability, suggesting right atrial pressure of 3 mmHg. Conclusion(s)/Recommendation(s): EF appears improved compared to prior study. FINDINGS  Left Ventricle: Left ventricular ejection fraction, by estimation, is 60 to 65%. The left ventricle has normal function. The left ventricle has no regional wall motion abnormalities. Abnormal (paradoxical) septal motion, consistent with RV pacemaker. Right Ventricle: The right ventricular size is normal. Right ventricular systolic function is normal. There is moderately elevated pulmonary artery systolic pressure. The tricuspid regurgitant velocity is 3.42 m/s, and with an assumed right atrial pressure of 3 mmHg, the estimated right ventricular systolic pressure is 49.8 mmHg. Pericardium: There is no evidence of pericardial effusion. Mitral Valve: Trivial mitral valve regurgitation. Tricuspid Valve: Tricuspid valve regurgitation is mild to moderate. Aortic Valve: Aortic valve regurgitation is not visualized. Venous: The inferior vena cava is normal in size with greater than 50% respiratory variability, suggesting right atrial pressure of 3 mmHg. LEFT VENTRICLE PLAX 2D LVIDd:         4.80 cm Diastology LVIDs:         3.10 cm LV e' medial:    6.42 cm/s LV PW:         1.30 cm LV E/e' medial:  22.3 LV IVS:        1.20 cm LV e' lateral:   7.72 cm/s                        LV E/e' lateral: 18.5  IVC IVC diam: 2.20 cm LEFT ATRIUM         Index LA diam:    4.00 cm 2.27 cm/m  AORTIC VALVE LVOT Vmax:   164.00 cm/s LVOT Vmean:  116.000 cm/s LVOT VTI:    0.345 m  AORTA Ao Asc diam: 2.90 cm MITRAL VALVE  TRICUSPID VALVE MV Area (PHT): 3.42 cm     TR Peak grad:   46.8 mmHg MV Decel Time: 222 msec     TR Vmax:        342.00 cm/s MV E velocity: 143.00 cm/s MV A velocity: 111.00 cm/s  SHUNTS MV E/A ratio:  1.29         Systemic VTI: 0.34 m Carolan Clines Electronically signed by Carolan Clines Signature Date/Time: 10/29/2021/4:44:18 PM    Final    CT Angio Chest PE W/Cm &/Or Wo Cm  Result Date: 10/29/2021 CLINICAL DATA:  Shortness of breath beginning yesterday. Patient is oxygen dependent. Possible pulmonary embolism. EXAM: CT ANGIOGRAPHY CHEST WITH CONTRAST TECHNIQUE: Multidetector CT imaging of the chest was performed using the standard protocol during bolus administration of intravenous contrast. Multiplanar CT image reconstructions and MIPs were obtained to evaluate the vascular anatomy. RADIATION DOSE REDUCTION: This exam was performed according to the departmental dose-optimization program which includes automated exposure control, adjustment of the mA and/or kV according to patient size and/or use of iterative reconstruction technique. CONTRAST:  97mL OMNIPAQUE IOHEXOL 350 MG/ML SOLN COMPARISON:  11/18/2020 FINDINGS: Cardiovascular: Stable cardiomegaly. Cardiac pacer over the left upper chest wall with pacer leads adequately position. Subtle calcified plaque over the left anterior descending and right coronary arteries. Thoracic aorta is normal in caliber. There is calcified plaque over the descending thoracic aorta. Pulmonary arterial system is adequately opacified and demonstrates no definite pulmonary emboli. Remaining vascular structures are unremarkable. Mediastinum/Nodes: 1.1 cm subcarinal lymph node. 1 cm right paratracheal lymph node. Findings likely reactive. No left hilar adenopathy. Remaining mediastinal structures are unremarkable. Lungs/Pleura: Lungs are adequately inflated demonstrate a patchy airspace process over the right lung most notable over the right lower lobe. Also less patchy opacification over the left upper lobe and left base. Associated bibasilar atelectasis and small bilateral pleural effusions. Findings likely due to multifocal pneumonia. Airways are normal. Upper Abdomen: Calcified plaque over the abdominal aorta. No acute findings. Musculoskeletal:  Degenerative changes of the spine. No focal abnormality. Review of the MIP images confirms the above findings. IMPRESSION: 1. No evidence of pulmonary embolism. 2. Patchy bilateral airspace process most prominent over the right lower lobe likely multifocal pneumonia. Small bilateral pleural effusions with associated bibasilar atelectasis. 3. Cardiomegaly with evidence of atherosclerotic coronary artery disease. 4. Aortic atherosclerosis. Aortic Atherosclerosis (ICD10-I70.0). Electronically Signed   By: Elberta Fortis M.D.   On: 10/29/2021 11:29   DG Chest Port 1 View  Result Date: 10/29/2021 CLINICAL DATA:  Shortness of breath. EXAM: PORTABLE CHEST 1 VIEW COMPARISON:  09/01/2021 FINDINGS: 0854 hours. Low volume film. The cardio pericardial silhouette is enlarged. Diffuse interstitial opacity suggests edema. Bibasilar atelectasis or infiltrate noted without substantial pleural effusion. Left-sided permanent pacemaker evident Telemetry leads overlie the chest. IMPRESSION: 1. Low volume film with diffuse interstitial opacity suggesting edema. 2. Bibasilar atelectasis or infiltrate. Electronically Signed   By: Kennith Center M.D.   On: 10/29/2021 09:17    Assessment/Plan:   Principal Problem:   Acute respiratory failure with hypoxia (HCC) Active Problems:   HTN (hypertension)   Hypothyroidism   COPD (chronic obstructive pulmonary disease) (HCC)   History of pulmonary embolus (PE)   Diabetes mellitus type 2 in obese (HCC)   Heart block AV complete (HCC)   Patient Summary: AROURA VASUDEVAN is a 81 y.o. with a pertinent PMH of PE 2012 on Xarelto, asthma and possible COPD, HFpEF, hypothyroidism, T2DM, HTN, complete heart block s/p pacemaker 08/2021,  CKD 3B, chronic hypoxia on 2-3L at home, who presented for acute episode of shortness of breath and admitted for acute on chronic respiratory failure secondary to pneumococcal pneumonia.    Acute on chronic hypoxic respiratory failure Community acquired  streptococcal pneumonia Still having productive cough with some blood tinged sputum. She appears clinically improved. Afebrile and normal WBC. Negative MRSA nasal swab. O2 requirements improved to 4L. Patient did not wear BiPAP last night.  -continue ceftriaxone, day 2/5.  -continue breo ellipta and duonebs as needed  Iron Deficiency Anemia History of PE (2012) on xarelto GERD Iron 14, Sat Ratio 4, Ferritin 51. Hgb 6.2 this morning, received 1 unit PRBC. Baseline of 7.6-7.8 in June. Patient reports history of anemia and possible remote history of stomach ulcer. Was not started on anything. No EDG done previously. Denies hematochezia. Does note darkened stools, unsure if after starting xarelto. Not on iron supplements or taking pepto-bismol. Does not take anything prescribed for GERD. Since she received transfusion today, this may help with her iron deficiency.  -H/H post transfusion -hold xarelto -will need outpatient follow up   HFpEF Complete heart block s/p pacemaker (08/2021) Echo showed EF 60-65% with no wall motion abnormalities. Patient does not appear volume overloaded.   HTN BP elevated today. On home amlodipine 10 mg and hydralazine 75 mg TID. If she continues to be hypertensive and have systolic >180 then consider restart home chlorthalidone.   T2DM uncontrolled with Hyperglycemia CBG elevated 300-400s. She is on [employer]. At home she is on lantus 16 units daily.  -increase semglee to 20 units -novolog 5 units with meals -SSI ACHS  CKD 3B Hypokalemia Hypomagnesemia  Creatinine at baseline. Potassium 4.9 and magnesium 2.4 after repletion. Will continue to monitor.   COPD/asthma O2 requirement decreased to 4L from 8L in ED. Home O2 of 3L. She reports less dyspnea today. Continue breo and duonebs as needed.   Hypothyroidism TSH 0.357. Continue 25 mcg daily.  Diet: Heart Healthy IVF: None,None VTE: None Code: Full PT/OT recs: Pending, none.  Dispo: Anticipated  discharge to Home in 1-2 days pending medically stable.   Rana Snare, DO Internal Medicine Resident PGY-1 Please contact the on call pager after 5 pm and on weekends at [phone].

## 2021-10-30 NOTE — Significant Event (Signed)
Patient is experiencing sore nose and throat with no relief with PRNs, will apply moisture MD aware.

## 2021-10-30 NOTE — Progress Notes (Signed)
Hgb = 6.2. MD notified.

## 2021-10-30 NOTE — Progress Notes (Signed)
Patient refused the use of BIPAP, stated that she cannot tolerate wearing the mask.

## 2021-10-30 NOTE — Progress Notes (Signed)
Received page from RN reporting tongue/tonsil swelling. This provider assessed patient at bedside. She reports continued tongue, tonsil, and neck swelling since earlier today. She does report that her voice is becoming more hoarse and she is having some pain with swallowing foods and liquids. She was unable to eat dinner as well due to this. She denies any respiratory issues and feels comfortable with her breathing. Still on 4L [place] saturating >92%, stable since earlier today. She remains HDS. She is able to converse in complete sentences without difficulty and has unlabored respirations. Tongue appears mildly swollen. Tonsils 3+ bilaterally. Does have mild cervical lymphadenopathy as noted earlier today. Lungs with mild expiratory wheezing but appears stable from earlier today.   Plan: -continue lozenges and chloraseptic throat spray -hydroxyzine syrup once for anxiety -discussed with RN and patient to pay close attention to respiratory status (stable at this time), if worsens and unable to protect, may need to consider intubation to protect airway   Of note, patient's CBG 100. She has not eaten any dinner today and her PO intake may be significantly limited tomorrow given tonsillitis and odynophagia. Will hold nighttime levemir tonight and continue to trend CBGs. Discussed with RN.

## 2021-10-30 NOTE — Hospital Course (Addendum)
Acute on chronic respiratory failure Community acquired streptococcal pneumonia Still having productive cough with some blood tinged sputum. She appears clinically improved. Afebrile and normal WBC. Negative MRSA nasal swab. O2 requirements improved to 3-4L. Patient refuses CPAP. Completed 5 day course of ceftriaxone.   Iron Deficiency Anemia History of PE (2012) on xarelto GERD Iron 14, Sat Ratio 4, Ferritin 51. Hgb 6.2 this morning, received 1 unit PRBC. Baseline of 7.6-7.8 in June. Patient reports history of anemia and possible remote history of stomach ulcer. Was not started on anything. No EDG done previously. Denies hematochezia. Does note darkened stools, unsure if after starting xarelto. Not on iron supplements or taking pepto-bismol. Does not take anything prescribed for GERD. Xarelto held.   Will need outpatient follow up with PCP.   HFpEF Complete heart block s/p pacemaker (08/2021) Echo showed EF 60-65% with no wall motion abnormalities. Patient does not appear volume overloaded.    HTN BP ***. On home amlodipine 10 mg and hydralazine 75 mg TID. Also on IV lasix during hospital course.    T2DM CBG elevated 300-400s. At home she is on lantus 16 units daily. During hospital stay, started on long acting and meal-time insulin along with SSI.    CKD 3B Hypokalemia Hypomagnesemia  Creatinine at baseline. Potassium 4.9 and magnesium 2.4 after repletion.    COPD/asthma O2 requirement decreased to 4L from 8L in ED. Home O2 of 3L. She reports less dyspnea and improvement with breathing. Breo and duonebs as needed during hospital course.    Hypothyroidism TSH 0.357. She is on levothyroxine 25 mcg daily.

## 2021-10-30 NOTE — Progress Notes (Signed)
Received message patient reporting tongue swelling. This provider assess patient at bedside. She reports increased tongue, neck swelling over the last few hours. No other new symptoms. On exam, she is hemodynamically stable, saturating well on 4L supplemental oxygen. Her airway is patent and she is able to talk in complete sentences without difficulty. Tongue does not appear swollen. Some mild cervical lymphadenopathy is present, but otherwise no abnormalities.   Offered reassurance to patient along with lozenge to assist with throat pain from coughing. We will continue care per Dr. Chelsea Aus note earlier today.   Evlyn Kanner, MD Internal Medicine PGY-3 Pager: [phone]

## 2021-10-31 DIAGNOSIS — K219 Gastro-esophageal reflux disease without esophagitis: Secondary | ICD-10-CM

## 2021-10-31 DIAGNOSIS — J440 Chronic obstructive pulmonary disease with acute lower respiratory infection: Secondary | ICD-10-CM | POA: Diagnosis not present

## 2021-10-31 DIAGNOSIS — J13 Pneumonia due to Streptococcus pneumoniae: Secondary | ICD-10-CM

## 2021-10-31 DIAGNOSIS — E1165 Type 2 diabetes mellitus with hyperglycemia: Secondary | ICD-10-CM | POA: Diagnosis not present

## 2021-10-31 DIAGNOSIS — R220 Localized swelling, mass and lump, head: Secondary | ICD-10-CM

## 2021-10-31 DIAGNOSIS — J9601 Acute respiratory failure with hypoxia: Secondary | ICD-10-CM | POA: Diagnosis not present

## 2021-10-31 DIAGNOSIS — I129 Hypertensive chronic kidney disease with stage 1 through stage 4 chronic kidney disease, or unspecified chronic kidney disease: Secondary | ICD-10-CM

## 2021-10-31 DIAGNOSIS — E872 Acidosis, unspecified: Secondary | ICD-10-CM

## 2021-10-31 DIAGNOSIS — Z6834 Body mass index (BMI) 34.0-34.9, adult: Secondary | ICD-10-CM

## 2021-10-31 LAB — TYPE AND SCREEN
ABO/RH(D): A POS
Antibody Screen: NEGATIVE
Unit division: 0

## 2021-10-31 LAB — CBC WITH DIFFERENTIAL/PLATELET
Abs Immature Granulocytes: 0.23 10*3/uL — ABNORMAL HIGH (ref 0.00–0.07)
Basophils Absolute: 0 10*3/uL (ref 0.0–0.1)
Basophils Relative: 0 %
Eosinophils Absolute: 0 10*3/uL (ref 0.0–0.5)
Eosinophils Relative: 0 %
HCT: 26.6 % — ABNORMAL LOW (ref 36.0–46.0)
Hemoglobin: 8.2 g/dL — ABNORMAL LOW (ref 12.0–15.0)
Immature Granulocytes: 3 %
Lymphocytes Relative: 12 %
Lymphs Abs: 0.9 10*3/uL (ref 0.7–4.0)
MCH: 22 pg — ABNORMAL LOW (ref 26.0–34.0)
MCHC: 30.8 g/dL (ref 30.0–36.0)
MCV: 71.5 fL — ABNORMAL LOW (ref 80.0–100.0)
Monocytes Absolute: 0.4 10*3/uL (ref 0.1–1.0)
Monocytes Relative: 6 %
Neutro Abs: 5.5 10*3/uL (ref 1.7–7.7)
Neutrophils Relative %: 79 %
Platelets: 209 10*3/uL (ref 150–400)
RBC: 3.72 MIL/uL — ABNORMAL LOW (ref 3.87–5.11)
RDW: 16.8 % — ABNORMAL HIGH (ref 11.5–15.5)
WBC: 7 10*3/uL (ref 4.0–10.5)
nRBC: 0.4 % — ABNORMAL HIGH (ref 0.0–0.2)

## 2021-10-31 LAB — BPAM RBC
Blood Product Expiration Date: 202309062359
ISSUE DATE / TIME: 202308190901
Unit Type and Rh: 6200

## 2021-10-31 LAB — BASIC METABOLIC PANEL
Anion gap: 13 (ref 5–15)
BUN: 47 mg/dL — ABNORMAL HIGH (ref 8–23)
CO2: 18 mmol/L — ABNORMAL LOW (ref 22–32)
Calcium: 8.4 mg/dL — ABNORMAL LOW (ref 8.9–10.3)
Chloride: 97 mmol/L — ABNORMAL LOW (ref 98–111)
Creatinine, Ser: 1.23 mg/dL — ABNORMAL HIGH (ref 0.44–1.00)
GFR, Estimated: 44 mL/min — ABNORMAL LOW (ref >60)
Glucose, Bld: 186 mg/dL — ABNORMAL HIGH (ref 70–99)
Potassium: 4.5 mmol/L (ref 3.5–5.1)
Sodium: 128 mmol/L — ABNORMAL LOW (ref 135–145)

## 2021-10-31 LAB — GLUCOSE, CAPILLARY
Glucose-Capillary: 170 mg/dL — ABNORMAL HIGH (ref 70–99)
Glucose-Capillary: 196 mg/dL — ABNORMAL HIGH (ref 70–99)
Glucose-Capillary: 145 mg/dL — ABNORMAL HIGH (ref 70–99)
Glucose-Capillary: 146 mg/dL — ABNORMAL HIGH (ref 70–99)

## 2021-10-31 MED ORDER — PANTOPRAZOLE SODIUM 40 MG PO TBEC: 40.0000 mg | DELAYED_RELEASE_TABLET | Freq: Every day | ORAL | Status: DC

## 2021-10-31 MED ORDER — PANTOPRAZOLE SODIUM 40 MG PO TBEC
40.0000 mg | DELAYED_RELEASE_TABLET | Freq: Every day | ORAL | Status: DC
  Administered 2021-11-01 – 2021-11-08 (×8): 40 mg via ORAL
  Filled 2021-10-31 (×9): qty 1

## 2021-10-31 MED ORDER — PANTOPRAZOLE 2 MG/ML SUSPENSION
40.0000 mg | Freq: Every day | ORAL | Status: DC
  Administered 2021-10-31: 40 mg via ORAL
  Filled 2021-10-31 (×9): qty 20

## 2021-10-31 MED ORDER — HYDROXYZINE HCL 10 MG/5ML PO SYRP
10.0000 mg | ORAL_SOLUTION | Freq: Two times a day (BID) | ORAL | Status: DC | PRN
  Administered 2021-10-31 – 2021-11-03 (×3): 10 mg via ORAL
  Filled 2021-10-31 (×5): qty 5

## 2021-10-31 NOTE — Evaluation (Signed)
**Note De-Identified Virginia Obfuscation** Physical Therapy Evaluation Patient Details Name: Virginia Summers MRN: [id number] DOB: 12/11/1940 Today's Date: 10/31/2021  History of Present Illness  Virginia Summers is a 81 year old female with past medical history of PE 2012 on Xarelto, asthma and possible COPD, HFpEF, hypothyroidism, type 2 diabetes, hypertension, complete heart block status post pacemaker placement 08/2021, CKD 3B, chronic hypoxia on 2-3 L at home, who presented to the emergency room for acute episode of shortness of breath.  Found to have multifocal PNA and CHF exacerbation.  Clinical Impression  Patient presents with decreased mobility due to deficits listed in PT problem list.  Currently min A for in room mobility and dependent on higher O2 with mobility.  She normally mobilizes in her home one her own, but per chart limited since O2 dependent.  She will benefit from skilled PT in the acute setting to allow return home with family support and follow up HHPT.        Recommendations for follow up therapy are one component of a multi-disciplinary discharge planning process, led by the attending physician.  Recommendations may be updated based on patient status, additional functional criteria and insurance authorization.  Follow Up Recommendations Home health PT      Assistance Recommended at Discharge Intermittent Supervision/Assistance  Patient can return home with the following  A little help with walking and/or transfers;A little help with bathing/dressing/bathroom;Assistance with cooking/housework;Help with stairs or ramp for entrance;Direct supervision/assist for medications management;Assist for transportation    Equipment Recommendations None recommended by PT  Recommendations for Other Services       Functional Status Assessment Patient has had a recent decline in their functional status and demonstrates the ability to make significant improvements in function in a reasonable and predictable amount of time.      Precautions / Restrictions Precautions Precautions: Fall Precaution Comments: watch SpO2      Mobility  Bed Mobility Overal bed mobility: Needs Assistance Bed Mobility: Supine to Sit, Sit to Supine     Supine to sit: Min assist Sit to supine: Min assist   General bed mobility comments: assist to lift trunk to sit up, assist for positioning to supine    Transfers Overall transfer level: Needs assistance Equipment used: Rolling walker (2 wheels) Transfers: Sit to/from Stand Sit to Stand: Min assist           General transfer comment: up from EOB with assist for balance, anterior weight shift    Ambulation/Gait Ambulation/Gait assistance: Min assist, Min guard Gait Distance (Feet): 20 Feet Assistive device: Rolling walker (2 wheels) Gait Pattern/deviations: Step-to pattern, Decreased stride length, Shuffle, Trunk flexed       General Gait Details: slow pace and limited due to SOB, shuffling steps and keeping walker at a distance, staying in room per pt fatigue and noted increased SOB  Stairs            Wheelchair Mobility    Modified Rankin (Stroke Patients Only)       Balance Overall balance assessment: Needs assistance   Sitting balance-Leahy Scale: Good     Standing balance support: Bilateral upper extremity supported Standing balance-Leahy Scale: Poor Standing balance comment: UE support for balance                             Pertinent Vitals/Pain Pain Assessment Pain Assessment: Faces Faces Pain Scale: Hurts little more Pain Location: throat Pain Descriptors / Indicators: Sore Pain Intervention(s): Monitored during session, Other (  comment) (assisted with throat spray)    Home Living Family/patient expects to be discharged to:: Private residence Living Arrangements: Children Available Help at Discharge: Family;Available PRN/intermittently Type of Home: House Home Access: Stairs to enter;Ramped entrance Entrance  Stairs-Rails: Right Entrance Stairs-Number of Steps: 4   Home Layout: One level Home Equipment: Agricultural consultant (2 wheels);Cane - single point;Shower seat Additional Comments: Has moved in with daughter within the past year for increased assist    Prior Function               Mobility Comments: Typically ambulatory without DME; reports BLE weakness and numbness since COVID last year (admitted 11/2020 for this) ADLs Comments: Does bird baths at sink without assist     Hand Dominance   Dominant Hand: Right    Extremity/Trunk Assessment   Upper Extremity Assessment Upper Extremity Assessment: Generalized weakness    Lower Extremity Assessment Lower Extremity Assessment: Generalized weakness    Cervical / Trunk Assessment Cervical / Trunk Assessment: Kyphotic  Communication   Communication: HOH  Cognition Arousal/Alertness: Awake/alert Behavior During Therapy: WFL for tasks assessed/performed Overall Cognitive Status: Within Functional Limits for tasks assessed                                 General Comments: aware of situation, but cognition not formally tested, seems [facility]        General Comments General comments (skin integrity, edema, etc.): on 3L O2 at rest, placed O2 sat note per MD order, see for details    Exercises     Assessment/Plan    PT Assessment Patient needs continued PT services  PT Problem List Decreased strength;Cardiopulmonary status limiting activity;Decreased activity tolerance;Decreased mobility;Decreased balance       PT Treatment Interventions DME instruction;Therapeutic exercise;Gait training;Balance training;Stair training;Functional mobility training;Therapeutic activities;Patient/family education    PT Goals (Current goals can be found in the Care Plan section)  Acute Rehab PT Goals Patient Stated Goal: to return to independent, feel better PT Goal Formulation: With patient Time For Goal Achievement:  11/14/21 Potential to Achieve Goals: Fair    Frequency Min 3X/week     Co-evaluation               AM-PAC PT "6 Clicks" Mobility  Outcome Measure Help needed turning from your back to your side while in a flat bed without using bedrails?: A Little Help needed moving from lying on your back to sitting on the side of a flat bed without using bedrails?: A Little Help needed moving to and from a bed to a chair (including a wheelchair)?: A Little Help needed standing up from a chair using your arms (e.g., wheelchair or bedside chair)?: A Little Help needed to walk in hospital room?: A Little Help needed climbing 3-5 steps with a railing? : Total 6 Click Score: 16    End of Session Equipment Utilized During Treatment: Gait belt;Oxygen Activity Tolerance: Patient limited by fatigue Patient left: in bed;with call bell/phone within reach   PT Visit Diagnosis: Other abnormalities of gait and mobility (R26.89);Muscle weakness (generalized) (M62.81)    Time: 5956-3875 PT Time Calculation (min) (ACUTE ONLY): 32 min   Charges:   PT Evaluation $PT Eval Moderate Complexity: 1 Mod PT Treatments $Gait Training: 8-22 mins        Sheran Lawless, PT Acute Rehabilitation Services [phone] 10/31/2021   Elray Mcgregor 10/31/2021, 11:11 AM

## 2021-10-31 NOTE — Progress Notes (Signed)
Pt refused bipap at this time but is in no distress requiring device.  RT will cont to monitor.

## 2021-10-31 NOTE — Progress Notes (Signed)
HD#2 Subjective:  Summary: Virginia Summers is a 81 year old female with PMH of PE 2012 on Xarelto, asthma and possible COPD, HFpEF, hypothyroidism, T2DM, HTN, 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  Overnight Events: Endorsed pain in throat, ears, and tongue swelling that improved with atarax, tylenol, and throat spray.   Ms. Oatley notes feeling better this morning after atarax, tylenol, throat spray. Feels tongue swelling has improved. Denies history of this or allergies requiring epinephrine. Breathing has improved but feels she is wheezing. She had a dark brown bowel movement this am. She is anxious after her last hospitalization where she went into complete heart block.  Objective:  Vital signs in last 24 hours: Vitals:   10/31/21 0057 10/31/21 0418 10/31/21 0843 10/31/21 0919  BP: (!) 192/67 (!) 186/59 (!) 181/63   Pulse: 73 71 72   Resp: 20 18 20    Temp: 98.5 F (36.9 C) 98.5 F (36.9 C) 99.1 F (37.3 C)   TempSrc: Oral Oral Oral   SpO2: 91% 91% 92% 93%  Weight:      Height:       Supplemental O2: 2.5 L [place]  Physical Exam:  Constitutional: chronically ill appearing HENT: normocephalic atraumatic. Nasal cannula in place.  Eyes: conjunctiva non-erythematous Neck: supple Cardiovascular: regular rate and rhythm, no m/r/g Pulmonary/Chest: normal work of breathing on 2.5L [place]. Diffuse wheezing and decreased breath sounds Abdominal: soft, non-tender, non-distended MSK: normal bulk and tone Neurological: alert & oriented x 3 Skin: warm and dry Psych: anxious appearing  Filed Weights   10/29/21 0820 10/30/21 0344 10/31/21 0022  Weight: 81.2 kg 80.3 kg 81 kg     Intake/Output Summary (Last 24 hours) at 10/31/2021 1026 Last data filed at 10/31/2021 0525 Gross per 24 hour  Intake 1080 ml   Output 751 ml  Net 329 ml   Net IO Since Admission: 1,209 mL [10/31/21 1026]  Pertinent Labs:    Latest Ref Rng & Units 10/31/2021    7:47 AM 10/30/2021    6:57 PM 10/30/2021    3:04 AM  CBC  WBC 4.0 - 10.5 K/uL 7.0   9.6   Hemoglobin 12.0 - 15.0 g/dL 8.2  7.9  6.2  C  Hematocrit 36.0 - 46.0 % 26.6  25.4  20.9   Platelets 150 - 400 K/uL 209   185     C Corrected result       Latest Ref Rng & Units 10/31/2021    7:47 AM 10/30/2021    3:04 AM 10/29/2021    8:28 AM  CMP  Glucose 70 - 99 mg/dL 10/31/2021  115  726   BUN 8 - 23 mg/dL 47  44  26   Creatinine 0.44 - 1.00 mg/dL 203  5.59  7.41   Sodium 135 - 145 mmol/L 128  131  132   Potassium 3.5 - 5.1 mmol/L 4.5  4.9  2.9   Chloride 98 - 111 mmol/L 97  97  97   CO2 22 - 32 mmol/L 18  24  24    Calcium 8.9 - 10.3 mg/dL 8.4  8.3  8.2   Total Protein 6.5 - 8.1 g/dL   6.9   Total Bilirubin 0.3 - 1.2 mg/dL   0.9   Alkaline Phos 38 - 126 U/L   53   AST  15 - 41 U/L   24   ALT 0 - 44 U/L   12     Imaging: No results found.  Assessment/Plan:   Principal Problem:   Acute respiratory failure with hypoxia (HCC) Active Problems:   HTN (hypertension)   Hypothyroidism   COPD (chronic obstructive pulmonary disease) (HCC)   History of pulmonary embolus (PE)   Diabetes mellitus type 2 in obese (HCC)   Heart block AV complete (HCC)   Community acquired pneumonia of right lower lobe of lung   Hypokalemia   Hypomagnesemia  Patient Summary: Virginia Summers is a 81 year old female with PMH of PE 2012 on Xarelto, asthma and possible COPD, HFpEF, hypothyroidism, T2DM, HTN, 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  Community acquired pneumococcal pneumonia Acute on chronic hypoxic respiratory failure 2/2 COPD & Asthma Oxygen requirement has improved to her  baseline. She is hemodynamically stable, afebrile, and not in respiratory distress. She is improving on day 3 of ceftriaxone.  -ceftriaxone day 3/5 -continue duonebs, breo ellipta -continue chronic O2 supplementation with moisturizer  Iron Deficiency Anemia History of PE (2012) on xarelto GERD Iron panel consistent with IDA. Hgb improved s/p 1u PRBC. Dark brown stools this morning. Hx of GERD and was told she needed an endoscopy in the past however this was never done. Low suspicion for acute brisk GI bleed. Drop in hgb likely from fluid changes. Once hgb stabilizes, will consider restarting xarelto and having her follow up outpatient for further evaluation and work up. -trend CBC daily, transfuse hgb under 7.  -start protonix 40 mg daily, continue PPI at discharge -follow up outpatient GI -if change in stool consistency or need for further transfusions, consult GI   Hyponatremia Anion Gap Metabolic Acidosis Na steadily declining over past few days. Possible etiology SIADH from her pulmonary infection vs poor PO intake. If no improvement over next day will pursue further workup. She refused CPAP in the evening as instructed in the past for possible OSA vs OHS likely contributing to her decreased bicarb -bmp tomorrow  Tongue swelling Patient endorsed tongue swelling which improved with tylenol, atarax. Better this morning, does not appear overtly swollen, will continue to monitor   CKD 3B Creatinine at baseline  T2DM uncontrolled with Hyperglycemia Glucose levels remained stable this am, increase to semglee 20 u and novolog 5 u with meals and SSI  HTN BP 190's systolic with MAPS in the 90's. On home amlodipine and hydralazine 75 mg TID. Her evening hydralazine was held. Also on diuretic therapy at home, will continue to monitor and hold chlorthalidone with hyponatremia.   Diet: Normal IVF: None,None VTE: SCDs Code: Full PT/OT recs: Pending, .  Dispo: Anticipated discharge to Home  in 2 days pending further work up and improvement in respiratory status.   Thalia Bloodgood DO Internal Medicine Resident PGY-3 Please contact the on call pager after 5 pm and on weekends at [phone].

## 2021-10-31 NOTE — Progress Notes (Signed)
SATURATION QUALIFICATIONS: (This note is used to comply with regulatory documentation for home oxygen)  Patient Saturations on Room Air at Rest = 83%  Patient Saturations on Room Air while Ambulating = NT due to desaturation on RA at rest.  Patient Saturations on 6 Liters of oxygen while Ambulating = 88%  Please briefly explain why patient needs home oxygen:  Needs home O2 due to hypoxia while on room air at rest.   Sheran Lawless, PT Acute Rehabilitation Services Office:[phone] 10/31/2021

## 2021-11-01 ENCOUNTER — Inpatient Hospital Stay (HOSPITAL_COMMUNITY): Payer: Medicare HMO

## 2021-11-01 DIAGNOSIS — J13 Pneumonia due to Streptococcus pneumoniae: Secondary | ICD-10-CM | POA: Diagnosis not present

## 2021-11-01 DIAGNOSIS — J440 Chronic obstructive pulmonary disease with acute lower respiratory infection: Secondary | ICD-10-CM | POA: Diagnosis not present

## 2021-11-01 DIAGNOSIS — R221 Localized swelling, mass and lump, neck: Secondary | ICD-10-CM

## 2021-11-01 DIAGNOSIS — E1165 Type 2 diabetes mellitus with hyperglycemia: Secondary | ICD-10-CM | POA: Diagnosis not present

## 2021-11-01 DIAGNOSIS — E669 Obesity, unspecified: Secondary | ICD-10-CM

## 2021-11-01 DIAGNOSIS — J9601 Acute respiratory failure with hypoxia: Secondary | ICD-10-CM | POA: Diagnosis not present

## 2021-11-01 LAB — CBC WITH DIFFERENTIAL/PLATELET
Abs Immature Granulocytes: 0.16 10*3/uL — ABNORMAL HIGH (ref 0.00–0.07)
Basophils Absolute: 0 10*3/uL (ref 0.0–0.1)
Basophils Relative: 0 %
Eosinophils Absolute: 0 10*3/uL (ref 0.0–0.5)
Eosinophils Relative: 1 %
HCT: 26 % — ABNORMAL LOW (ref 36.0–46.0)
Hemoglobin: 7.7 g/dL — ABNORMAL LOW (ref 12.0–15.0)
Immature Granulocytes: 4 %
Lymphocytes Relative: 15 %
Lymphs Abs: 0.6 10*3/uL — ABNORMAL LOW (ref 0.7–4.0)
MCH: 21.6 pg — ABNORMAL LOW (ref 26.0–34.0)
MCHC: 29.6 g/dL — ABNORMAL LOW (ref 30.0–36.0)
MCV: 72.8 fL — ABNORMAL LOW (ref 80.0–100.0)
Monocytes Absolute: 0.2 10*3/uL (ref 0.1–1.0)
Monocytes Relative: 6 %
Neutro Abs: 3.2 10*3/uL (ref 1.7–7.7)
Neutrophils Relative %: 74 %
Platelets: 221 10*3/uL (ref 150–400)
RBC: 3.57 MIL/uL — ABNORMAL LOW (ref 3.87–5.11)
RDW: 16.6 % — ABNORMAL HIGH (ref 11.5–15.5)
WBC: 4.2 10*3/uL (ref 4.0–10.5)
nRBC: 0 % (ref 0.0–0.2)

## 2021-11-01 LAB — GLUCOSE, CAPILLARY
Glucose-Capillary: 88 mg/dL (ref 70–99)
Glucose-Capillary: 154 mg/dL — ABNORMAL HIGH (ref 70–99)
Glucose-Capillary: 112 mg/dL — ABNORMAL HIGH (ref 70–99)
Glucose-Capillary: 141 mg/dL — ABNORMAL HIGH (ref 70–99)

## 2021-11-01 LAB — BASIC METABOLIC PANEL
Anion gap: 8 (ref 5–15)
BUN: 31 mg/dL — ABNORMAL HIGH (ref 8–23)
CO2: 26 mmol/L (ref 22–32)
Calcium: 8.3 mg/dL — ABNORMAL LOW (ref 8.9–10.3)
Chloride: 97 mmol/L — ABNORMAL LOW (ref 98–111)
Creatinine, Ser: 1.05 mg/dL — ABNORMAL HIGH (ref 0.44–1.00)
GFR, Estimated: 53 mL/min — ABNORMAL LOW (ref >60)
Glucose, Bld: 96 mg/dL (ref 70–99)
Potassium: 3.8 mmol/L (ref 3.5–5.1)
Sodium: 131 mmol/L — ABNORMAL LOW (ref 135–145)

## 2021-11-01 MED ORDER — ONDANSETRON HCL 4 MG/2ML IJ SOLN
4.0000 mg | Freq: Four times a day (QID) | INTRAMUSCULAR | Status: DC | PRN
  Administered 2021-11-01 – 2021-11-06 (×4): 4 mg via INTRAVENOUS
  Filled 2021-11-01 (×4): qty 2

## 2021-11-01 MED ORDER — IOHEXOL 300 MG/ML  SOLN
75.0000 mL | Freq: Once | INTRAMUSCULAR | Status: AC | PRN
  Administered 2021-11-01: 75 mL via INTRAVENOUS

## 2021-11-01 NOTE — Progress Notes (Signed)
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  Overnight Events: none  [name] reports bilateral neck pain along the jaw. She has tenderness and pain with rotating neck. She thinks it started since she was admitted. No tongue swelling or trouble swallowing. Denies fever, chills, nausea or vomiting.   Objective:  Vital signs in last 24 hours: Vitals:   10/31/21 2022 11/01/21 0008 11/01/21 0459 11/01/21 0602  BP: (!) 164/59 (!) 160/44  (!) 153/53  Pulse: 69 68  83  Resp: 20 20 20 20   Temp: 98.2 F (36.8 C) 98.2 F (36.8 C) 98.3 F (36.8 C) 98.3 F (36.8 C)  TempSrc: Oral Oral Oral Oral  SpO2: 91% 92%  94%  Weight:      Height:       Supplemental O2: 3 L [place]  Physical Exam:  Constitutional: chronically ill appearing, in no acute distress HENT: normocephalic atraumatic. Nasal cannula in place.  Neck: some swelling around left submandibular gland, pain with neck rotation  Cardiovascular: regular rate and rhythm, no m/r/g Pulmonary/Chest: normal work of breathing on 3L [place]  MSK: normal bulk and tone Neurological: alert & oriented x 3 Skin: warm and dry Psych: anxious mood, normal behavior  Filed Weights   10/29/21 0820 10/30/21 0344 10/31/21 0022  Weight: 81.2 kg 80.3 kg 81 kg     Intake/Output Summary (Last 24 hours) at 11/01/2021 0743 Last data filed at 11/01/2021 0600 Gross per 24 hour  Intake 340 ml  Output 400 ml  Net -60 ml    Net IO Since Admission: 1,149 mL [11/01/21 0743]  Pertinent Labs:    Latest Ref Rng & Units 10/31/2021    7:47 AM 10/30/2021    6:57 PM 10/30/2021    3:04 AM  CBC  WBC 4.0 - 10.5 K/uL 7.0   9.6    Hemoglobin 12.0 - 15.0 g/dL 8.2  7.9  6.2  C  Hematocrit 36.0 - 46.0 % 26.6  25.4  20.9   Platelets 150 - 400 K/uL 209   185     C Corrected result        Latest Ref Rng & Units 10/31/2021    7:47 AM 10/30/2021    3:04 AM 10/29/2021    8:28 AM  CMP  Glucose 70 - 99 mg/dL 10/31/2021  086  761   BUN 8 - 23 mg/dL 47  44  26   Creatinine 0.44 - 1.00 mg/dL 950  9.32  6.71   Sodium 135 - 145 mmol/L 128  131  132   Potassium 3.5 - 5.1 mmol/L 4.5  4.9  2.9   Chloride 98 - 111 mmol/L 97  97  97   CO2 22 - 32 mmol/L 18  24  24    Calcium 8.9 - 10.3 mg/dL 8.4  8.3  8.2   Total Protein 6.5 - 8.1 g/dL   6.9   Total Bilirubin 0.3 - 1.2 mg/dL   0.9   Alkaline Phos 38 - 126 U/L   53   AST 15 - 41 U/L   24   ALT 0 - 44 U/L   12     Imaging: No results found.  Assessment/Plan:   Principal Problem:   Acute respiratory failure with hypoxia (HCC) Active Problems:   HTN (hypertension)   Hypothyroidism   COPD (chronic obstructive pulmonary disease) (HCC)   History of pulmonary embolus (PE)   Diabetes mellitus type 2 in obese (HCC)   Heart block AV complete (HCC)   Community acquired pneumonia of right lower lobe of lung   Hypokalemia   Hypomagnesemia  Patient Summary: Virginia Summers is a 81 year old female with PMH of PE 2012 on Xarelto, asthma and possible COPD, HFpEF, hypothyroidism, T2DM, HTN, 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  Community acquired pneumococcal pneumonia Acute on chronic hypoxic respiratory failure 2/2 COPD & Asthma Oxygen requirement has improved to baseline. Afebrile and not in respiratory distress. Patient continues to have productive cough. Improving with ceftriaxone therapy.  -ceftriaxone, day 4/5 -continue duonebs and breo ellipta -continue humidified O2 supplementation   Iron  Deficiency Anemia History of PE (2012) on xarelto GERD Hgb 7.7 today s/p 1u PRBC. Iron panel consistent with IDA. Hx of GERD and was told she needed an endoscopy in the past however this was never done. Low suspicion for acute brisk GI bleed. Drop in hgb likely from fluid changes. Once hgb stabilizes, will consider restarting xarelto and having her follow up outpatient for further evaluation and work up. -trend CBC daily, transfuse Hgb under 7 -protonix 40 mg daily, continue PPI at discharge -follow up with outpatient GI -if change in stool consistency or need for further transfusions, consult GI   Hyponatremia Anion Gap Metabolic Acidosis Sodium improved to 131. Possible etiology SIADH from her pulmonary infection vs poor PO intake. Bicarb improved to 26. She continues to refuse CPAP in the evening as instructed in the past for possible OSA vs OHS which likely contributed to her low bicarb. -BMP  Anterior neck swelling Tongue swelling Patient reports neck swelling and tenderness along the jaw bilaterally. Some swelling noted around left submandibular gland. Has some sore throat noted. Will obtain imaging. She reported tongue swelling the day before which improved with tylenol and atarax. No tongue swelling noted today.   -CT soft tissue neck w/ contrast  CKD 3B Creatinine at baseline. Will continue to monitor.   T2DM  Glucose levels stable this morning. On levemir 20 u, novolog 5 u and SSI. -continue levemir 20 u, novolog 5 u, SSI  HTN BP 140-150 systolic with MAP in 80s. On home amlodipine and hydralazine. Also on diuretic therapy at home, will monitor and hold chlorthalidone with hyponatremia.   Diet: Heart Healthy IVF: None,None VTE: SCDs Code: Full PT/OT recs: Home Health  Dispo: Anticipated discharge to Home in 1-2 days pending further work up and improvement in respiratory status.   Rana Snare, DO Internal Medicine Resident PGY-1 Please contact the on call pager after  5 pm and on weekends at [phone].

## 2021-11-01 NOTE — Care Management Important Message (Signed)
Important Message  Patient Details  Name: Virginia Summers MRN: [id number] Date of Birth: 1940-03-27   Medicare Important Message Given:  Yes     Renie Ora 11/01/2021, 8:11 AM

## 2021-11-01 NOTE — TOC Initial Note (Signed)
Transition of Care [facility]) - Initial/Assessment Note    Patient Details  Name: Virginia Summers MRN: [id number] Date of Birth: 02/12/1941  Transition of Care [facility]) CM/SW Contact:    Gala Lewandowsky, RN Phone Number: 11/01/2021, 12:37 PM  Clinical Narrative: Risk for readmission assessment completed. Patient initially presented for shortness of breath.  PTA patient was from home with daughter and will return home with home health services with [facility]. Daughter states patient has DME cane, rolling walker, and oxygen in the home via [employer]. Oxygen is at 2 Liters for home. Patient will need HH PT/OT/RN orders with F2F once stable. Case Manager will continue to follow for additional transition of care needs as the patient progresses.              Expected Discharge Plan: Home w Home Health Services Barriers to Discharge: Continued Medical Work up   Expected Discharge Plan and Services Expected Discharge Plan: Home w Home Health Services In-house Referral: NA Discharge Planning Services: CM Consult Post Acute Care Choice: Home Health, Resumption of Svcs/PTA Provider Living arrangements for the past 2 months: Single Family Home                 DME Arranged: N/A         HH Arranged: RN, Disease Management, PT, OT HH Agency: [facility] Date HH Agency Contacted: 11/01/21 Time HH Agency Contacted: 1200 Representative spoke with at [facility] Agency: Tresa Endo  Prior Living Arrangements/Services Living arrangements for the past 2 months: Single Family Home Lives with:: Adult Children Patient language and need for interpreter reviewed:: Yes Do you feel safe going back to the place where you live?: Yes      Need for Family Participation in Patient Care: Yes (Comment) Care giver support system in place?: Yes (comment) Current home services: DME (oxygen via [employer]- 2 Liters, Cane and RW) Criminal Activity/Legal Involvement Pertinent to Current Situation/Hospitalization:  No - Comment as needed  Activities of Daily Living Home Assistive Devices/Equipment: CBG Meter ADL Screening (condition at time of admission) Patient's cognitive ability adequate to safely complete daily activities?: Yes Is the patient deaf or have difficulty hearing?: No Does the patient have difficulty seeing, even when wearing glasses/contacts?: No Does the patient have difficulty concentrating, remembering, or making decisions?: No Patient able to express need for assistance with ADLs?: No Does the patient have difficulty dressing or bathing?: No Independently performs ADLs?: Yes (appropriate for developmental age) Does the patient have difficulty walking or climbing stairs?: Yes Weakness of Legs: Both Weakness of Arms/Hands: None  Permission Sought/Granted Permission sought to share information with : Family Supports, Case Production designer, theatre/television/film, Oceanographer granted to share information with : Yes, Verbal Permission Granted     Permission granted to share info w AGENCY: Wood,Karen        Emotional Assessment Appearance:: Appears stated age       Alcohol / Substance Use: Not Applicable Psych Involvement: No (comment)  Admission diagnosis:  Hypokalemia [E87.6] Hypomagnesemia [E83.42] Acute respiratory failure with hypoxia (HCC) [J96.01] Acute hypoxemic respiratory failure (HCC) [J96.01] Community acquired pneumonia of right lower lobe of lung [J18.9] Patient Active Problem List   Diagnosis Date Noted   Community acquired pneumonia of right lower lobe of lung    Hypokalemia    Hypomagnesemia    Coagulase negative Staphylococcus bacteremia    Heart block AV complete (HCC)    Bradycardia 08/27/2021   Acute respiratory failure (HCC) 08/27/2021   Renal failure  Hyperkalemia    Respiratory failure (HCC) 11/19/2020   Acute respiratory failure with hypoxemia (HCC) 11/17/2020   Hypertensive urgency 11/17/2020   Diabetes mellitus type 2 in obese (HCC)  11/17/2020   Acute respiratory failure with hypoxia (HCC) 11/17/2020   COVID-19 virus infection 11/17/2020   Precordial chest pain    Palpitations 04/17/2020   Morbid obesity (HCC) 06/04/2019   COPD (chronic obstructive pulmonary disease) (HCC) 05/10/2019   History of pulmonary embolus (PE) 05/10/2019   Hiatal hernia 05/12/2011   Hypothyroidism 08/10/2010   Cramp 06/09/2010   Pulmonary HTN (HCC) 06/09/2010   Pulmonary embolism (HCC)    HTN (hypertension)    Anticoagulant long-term use    PCP:  Eather Colas, FNP Pharmacy:   [facility] [address] ([address]), [place] [address] [place] ([place]) [place] [address] Phone: [phone] Fax: [phone]  [facility] [address] [place] [address] Phone: [phone] Fax: [phone]  Readmission Risk Interventions    11/01/2021   12:33 PM  Readmission Risk Prevention Plan  Transportation Screening Complete  PCP or Specialist Appt within 3-5 Days Complete  HRI or Home Care Consult Complete  Social Work Consult for Recovery Care Planning/Counseling Complete  Palliative Care Screening Not Applicable  Medication Review Oceanographer) Referral to Pharmacy

## 2021-11-02 DIAGNOSIS — J440 Chronic obstructive pulmonary disease with acute lower respiratory infection: Secondary | ICD-10-CM | POA: Diagnosis not present

## 2021-11-02 DIAGNOSIS — E1165 Type 2 diabetes mellitus with hyperglycemia: Secondary | ICD-10-CM | POA: Diagnosis not present

## 2021-11-02 DIAGNOSIS — J13 Pneumonia due to Streptococcus pneumoniae: Secondary | ICD-10-CM | POA: Diagnosis not present

## 2021-11-02 DIAGNOSIS — D509 Iron deficiency anemia, unspecified: Secondary | ICD-10-CM

## 2021-11-02 DIAGNOSIS — J9601 Acute respiratory failure with hypoxia: Secondary | ICD-10-CM | POA: Diagnosis not present

## 2021-11-02 LAB — OCCULT BLOOD X 1 CARD TO LAB, STOOL: Fecal Occult Bld: NEGATIVE

## 2021-11-02 LAB — GLUCOSE, CAPILLARY
Glucose-Capillary: 141 mg/dL — ABNORMAL HIGH (ref 70–99)
Glucose-Capillary: 216 mg/dL — ABNORMAL HIGH (ref 70–99)
Glucose-Capillary: 175 mg/dL — ABNORMAL HIGH (ref 70–99)
Glucose-Capillary: 143 mg/dL — ABNORMAL HIGH (ref 70–99)
Glucose-Capillary: 179 mg/dL — ABNORMAL HIGH (ref 70–99)

## 2021-11-02 LAB — LEGIONELLA PNEUMOPHILA SEROGP 1 UR AG: L. pneumophila Serogp 1 Ur Ag: NEGATIVE

## 2021-11-02 LAB — BASIC METABOLIC PANEL
Anion gap: 12 (ref 5–15)
BUN: 44 mg/dL — ABNORMAL HIGH (ref 8–23)
CO2: 24 mmol/L (ref 22–32)
Calcium: 8.1 mg/dL — ABNORMAL LOW (ref 8.9–10.3)
Chloride: 95 mmol/L — ABNORMAL LOW (ref 98–111)
Creatinine, Ser: 1.74 mg/dL — ABNORMAL HIGH (ref 0.44–1.00)
GFR, Estimated: 29 mL/min — ABNORMAL LOW (ref >60)
Glucose, Bld: 143 mg/dL — ABNORMAL HIGH (ref 70–99)
Potassium: 4.7 mmol/L (ref 3.5–5.1)
Sodium: 131 mmol/L — ABNORMAL LOW (ref 135–145)

## 2021-11-02 LAB — CBC
HCT: 24.4 % — ABNORMAL LOW (ref 36.0–46.0)
Hemoglobin: 7.2 g/dL — ABNORMAL LOW (ref 12.0–15.0)
MCH: 22 pg — ABNORMAL LOW (ref 26.0–34.0)
MCHC: 29.5 g/dL — ABNORMAL LOW (ref 30.0–36.0)
MCV: 74.6 fL — ABNORMAL LOW (ref 80.0–100.0)
Platelets: 222 10*3/uL (ref 150–400)
RBC: 3.27 MIL/uL — ABNORMAL LOW (ref 3.87–5.11)
RDW: 16.9 % — ABNORMAL HIGH (ref 11.5–15.5)
WBC: 6.2 10*3/uL (ref 4.0–10.5)
nRBC: 0.3 % — ABNORMAL HIGH (ref 0.0–0.2)

## 2021-11-02 MED ORDER — SODIUM CHLORIDE 0.9 % IV SOLN
250.0000 mg | Freq: Once | INTRAVENOUS | Status: AC
  Administered 2021-11-02: 250 mg via INTRAVENOUS
  Filled 2021-11-02: qty 20

## 2021-11-02 MED ORDER — LACTATED RINGERS IV SOLN: INTRAVENOUS | Status: AC

## 2021-11-02 MED ORDER — SODIUM CHLORIDE 0.9 % IV SOLN
250.0000 mg | Freq: Every day | INTRAVENOUS | Status: AC
  Administered 2021-11-03 – 2021-11-04 (×2): 250 mg via INTRAVENOUS
  Filled 2021-11-02 (×2): qty 20

## 2021-11-02 NOTE — Progress Notes (Signed)
Physical Therapy Treatment Patient Details Name: Virginia Summers MRN: [id number] DOB: [birth year] Today's Date: 11/02/2021   History of Present Illness Virginia Summers is a 81 year old female with past medical history of PE 2012 on Xarelto, asthma and possible COPD, HFpEF, hypothyroidism, type 2 diabetes, hypertension, complete heart block status post pacemaker placement 08/2021, CKD 3B, chronic hypoxia on 2-3 L at home, who presented to the emergency room for acute episode of shortness of breath.  Found to have multifocal PNA and CHF exacerbation.    PT Comments    Pt very fatigued today and reports that she is not feeling well, only specific complaint was HA which was relayed to RN. Pt needed min A for mobility. Practiced with rollator today because pt reports she was unable to ambulate from her bedroom to kitchen so recommend rollator to allow pt to sit and rest as needed with household mobility. Pt ambulated only 8' before needing to sit and rest. SpO2 dropped to 80% on 4L O2. O2 increased to 6L with increased in SPO2 to 91%. PT will continue to follow.    Recommendations for follow up therapy are one component of a multi-disciplinary discharge planning process, led by the attending physician.  Recommendations may be updated based on patient status, additional functional criteria and insurance authorization.  Follow Up Recommendations  Home health PT     Assistance Recommended at Discharge Intermittent Supervision/Assistance  Patient can return home with the following A little help with walking and/or transfers;A little help with bathing/dressing/bathroom;Assistance with cooking/housework;Help with stairs or ramp for entrance;Direct supervision/assist for medications management;Assist for transportation   Equipment Recommendations  None recommended by PT    Recommendations for Other Services       Precautions / Restrictions Precautions Precautions: Fall Precaution Comments: watch  SpO2 Restrictions Weight Bearing Restrictions: No     Mobility  Bed Mobility Overal bed mobility: Needs Assistance Bed Mobility: Supine to Sit, Sit to Supine     Supine to sit: Min assist Sit to supine: Min assist   General bed mobility comments: min A to initiate supine to sit, min A to LE's for return to supine    Transfers Overall transfer level: Needs assistance Equipment used: Rollator (4 wheels) Transfers: Sit to/from Stand Sit to Stand: Min assist           General transfer comment: min A to steady. Practiced standing from rollator as well, also min A to steady    Ambulation/Gait Ambulation/Gait assistance: Min assist Gait Distance (Feet): 16 Feet Assistive device: Rollator (4 wheels) Gait Pattern/deviations: Step-to pattern, Decreased stride length, Shuffle, Trunk flexed Gait velocity: decreased Gait velocity interpretation: <1.31 ft/sec, indicative of household ambulator   General Gait Details: pt very SOB, SPO2 dropped to 80% on 4L, O2 increased to 6L with return to 91%. Pt felt that she had to sit after 8'. Turned to rollator to sit down   Social research officer, government Rankin (Stroke Patients Only)       Balance Overall balance assessment: Needs assistance   Sitting balance-Leahy Scale: Good     Standing balance support: Bilateral upper extremity supported Standing balance-Leahy Scale: Poor Standing balance comment: UE support for balance                            Cognition Arousal/Alertness: Lethargic Behavior During Therapy: WFL for tasks assessed/performed Overall Cognitive  Status: Within Functional Limits for tasks assessed                                 General Comments: very HOH making it hard to assess cognition because she often answers questions inappropriately due to not hearing correctly        Exercises      General Comments General comments (skin integrity, edema,  etc.): pt anxious because daughter left while she was napping and she didn't know where she went. RN present, O2 left on 6L while pt recovered      Pertinent Vitals/Pain Pain Assessment Pain Assessment: Faces Faces Pain Scale: Hurts little more Pain Location: head Pain Descriptors / Indicators: Headache Pain Intervention(s): Patient requesting pain meds-RN notified    Home Living                          Prior Function            PT Goals (current goals can now be found in the care plan section) Acute Rehab PT Goals Patient Stated Goal: to return to independent, feel better PT Goal Formulation: With patient Time For Goal Achievement: 11/14/21 Potential to Achieve Goals: Fair Progress towards PT goals: Not progressing toward goals - comment (not feeling well today)    Frequency    Min 3X/week      PT Plan Current plan remains appropriate    Co-evaluation              AM-PAC PT "6 Clicks" Mobility   Outcome Measure  Help needed turning from your back to your side while in a flat bed without using bedrails?: A Little Help needed moving from lying on your back to sitting on the side of a flat bed without using bedrails?: A Little Help needed moving to and from a bed to a chair (including a wheelchair)?: A Little Help needed standing up from a chair using your arms (e.g., wheelchair or bedside chair)?: A Little Help needed to walk in hospital room?: A Little Help needed climbing 3-5 steps with a railing? : Total 6 Click Score: 16    End of Session Equipment Utilized During Treatment: Gait belt;Oxygen Activity Tolerance: Patient limited by fatigue Patient left: in bed;with call bell/phone within reach;with nursing/sitter in room Nurse Communication: Mobility status PT Visit Diagnosis: Other abnormalities of gait and mobility (R26.89);Muscle weakness (generalized) (M62.81)     Time: 9518-8416 PT Time Calculation (min) (ACUTE ONLY): 33  min  Charges:  $Gait Training: 8-22 mins $Therapeutic Activity: 8-22 mins                     Lyanne Co, PT  Acute Rehab Services Secure chat preferred Office [phone]    Elyse Hsu 11/02/2021, 3:38 PM

## 2021-11-02 NOTE — Progress Notes (Signed)
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  Overnight Events: none  Virginia Summers continues to have productive cough. Still sore throat and left sided neck pain along jaw line. She has not been eating well since being in the hospital. Daughter states she has difficulty getting to appointment because of her O2 sats dropping.   Objective:  Vital signs in last 24 hours: Vitals:   11/02/21 0631 11/02/21 0731 11/02/21 0831 11/02/21 1210  BP:   (!) 151/47 (!) 167/50  Pulse:   63 62  Resp:  20    Temp: 98.6 F (37 C)     TempSrc: Oral     SpO2:   94% 91%  Weight:      Height:       Supplemental O2: 3-4 L [place]  Physical Exam:  Constitutional: chronically ill appearing, in no acute distress HENT: normocephalic atraumatic. Nasal cannula in place.  Neck: tenderness along left jaw line Cardiovascular: regular rate and rhythm, no m/r/g  Pulmonary/Chest: normal work of breathing on 3-4L [place]   Neurological: alert & oriented x 3 Skin: warm and dry Psych: anxious mood, normal behavior  Filed Weights   10/30/21 0344 10/31/21 0022 11/02/21 0035  Weight: 80.3 kg 81 kg 80.7 kg     Intake/Output Summary (Last 24 hours) at 11/02/2021 1442 Last data filed at 11/02/2021 1304 Gross per 24 hour  Intake 296 ml  Output 520 ml  Net -224 ml   Net IO Since Admission: 1,185 mL [11/02/21 1442]  Pertinent Labs:    Latest Ref Rng & Units 11/02/2021    4:33 AM 11/01/2021    7:18 AM 10/31/2021    7:47 AM  CBC  WBC 4.0 - 10.5 K/uL 6.2  4.2  7.0   Hemoglobin 12.0 - 15.0 g/dL 7.2  7.7  8.2   Hematocrit 36.0 - 46.0 % 24.4  26.0  26.6   Platelets 150 - 400 K/uL 222  221   209        Latest Ref Rng & Units 11/02/2021    4:33 AM 11/01/2021    7:18 AM 10/31/2021    7:47 AM  CMP  Glucose 70 - 99 mg/dL 716  96  967   BUN 8 - 23 mg/dL 44  31  47   Creatinine 0.44 - 1.00 mg/dL 8.93  8.10  1.75   Sodium 135 - 145 mmol/L 131  131  128   Potassium 3.5 - 5.1 mmol/L 4.7  3.8  4.5   Chloride 98 - 111 mmol/L 95  97  97   CO2 22 - 32 mmol/L 24  26  18    Calcium 8.9 - 10.3 mg/dL 8.1  8.3  8.4     Imaging: No results found.  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  Community acquired pneumococcal pneumonia Acute on chronic hypoxic respiratory failure 2/2 COPD & Asthma Oxygen requirement has improved to baseline. Afebrile and not in respiratory distress. Continues to have productive cough. Will complete 5 day of ceftriaxone today.  -finish ceftriaxone, day 5/5 -continue duonebs and breo ellipta -continue humidified O2 supplementation   Iron Deficiency Anemia History of PE (2012) on xarelto GERD Hgb decreased to 7.2 today from 7.7. She has not been eating well. Iron panel consistent with IDA. Hx of GERD and was told she needed an endoscopy in the past however this was never done. Low suspicion for acute brisk GI bleed. Once hgb stabilizes,  will consider restarting xarelto and having her follow up outpatient for further evaluation and work up. -FOBT  -ferric gluconate 250 mg once today, then 2 doses for tomorrow -trend CBC daily, transfuse Hgb under 7 -protonix 40 mg daily, continue PPI at discharge -follow up with outpatient GI  Hyponatremia Anion Gap Metabolic Acidosis Sodium steady at 131. Bicarb at 24. She has had poor po intake since admission. She continues to refuse CPAP in the evening as instructed in the past for possible OSA vs OHS which likely contributed to her low bicarb. -BMP  Anterior neck swelling Patient still having tenderness along left jawline. CT neck w/o contrast did not show any acute infection or abscess in the neck. Likely lymphadenopathy from recent infection. Sore throat likely from the coughing. She is using the chloraseptic spray.   CKD 3B Creatinine increased to 1.74. Continue to monitor.  -repeat BMP -start LR 75 ml/hr  T2DM  Glucose overnight 140s. On levemir 20 u, novolog 5 u and SSI.  -continue with CBGs -continue levemir 20 u, novolog 5 u, SSI  HTN BP 140-160 systolic with MAP in 70s. On home amlodipine and hydralazine. Also on diuretic therapy at home, will monitor and hold chlorthalidone with hyponatremia.   Diet: Heart Healthy IVF: LR,75cc/hr VTE: SCDs Code: Full PT/OT recs: Home Health PT  Dispo: Anticipated discharge to Home in 1-2 days pending further work up and improvement in respiratory status.   Rana Snare, DO Internal Medicine Resident PGY-1 Please contact the on call pager after 5 pm and on weekends at [phone].

## 2021-11-02 NOTE — Progress Notes (Signed)
   11/02/21 1000  Mobility  Activity Ambulated with assistance in room  Level of Assistance Contact guard assist, steadying assist  Assistive Device Front wheel walker  Distance Ambulated (ft) 40 ft  Activity Response Tolerated well  $Mobility charge 1 Mobility   Mobility Specialist Progress Note  Pt was in bed and agreeable. Had c/o dizziness while sitting EOB but quickly resolved. Took X1 seated break halfway. Returned back to bed w/ chair alarm on and call bell in reach.   Anastasia Pall Mobility Specialist

## 2021-11-03 ENCOUNTER — Inpatient Hospital Stay (HOSPITAL_COMMUNITY): Payer: Medicare HMO

## 2021-11-03 DIAGNOSIS — J440 Chronic obstructive pulmonary disease with acute lower respiratory infection: Secondary | ICD-10-CM | POA: Diagnosis not present

## 2021-11-03 DIAGNOSIS — J13 Pneumonia due to Streptococcus pneumoniae: Secondary | ICD-10-CM | POA: Diagnosis not present

## 2021-11-03 DIAGNOSIS — I10 Essential (primary) hypertension: Secondary | ICD-10-CM

## 2021-11-03 DIAGNOSIS — I5033 Acute on chronic diastolic (congestive) heart failure: Secondary | ICD-10-CM

## 2021-11-03 DIAGNOSIS — J9601 Acute respiratory failure with hypoxia: Secondary | ICD-10-CM | POA: Diagnosis not present

## 2021-11-03 LAB — GLUCOSE, CAPILLARY
Glucose-Capillary: 136 mg/dL — ABNORMAL HIGH (ref 70–99)
Glucose-Capillary: 207 mg/dL — ABNORMAL HIGH (ref 70–99)
Glucose-Capillary: 200 mg/dL — ABNORMAL HIGH (ref 70–99)
Glucose-Capillary: 139 mg/dL — ABNORMAL HIGH (ref 70–99)

## 2021-11-03 LAB — CBC
HCT: 23.1 % — ABNORMAL LOW (ref 36.0–46.0)
Hemoglobin: 7 g/dL — ABNORMAL LOW (ref 12.0–15.0)
MCH: 21.7 pg — ABNORMAL LOW (ref 26.0–34.0)
MCHC: 30.3 g/dL (ref 30.0–36.0)
MCV: 71.7 fL — ABNORMAL LOW (ref 80.0–100.0)
Platelets: 199 10*3/uL (ref 150–400)
RBC: 3.22 MIL/uL — ABNORMAL LOW (ref 3.87–5.11)
RDW: 17.2 % — ABNORMAL HIGH (ref 11.5–15.5)
WBC: 6.7 10*3/uL (ref 4.0–10.5)
nRBC: 0.3 % — ABNORMAL HIGH (ref 0.0–0.2)

## 2021-11-03 LAB — BLOOD GAS, ARTERIAL
Acid-Base Excess: 4.6 mmol/L — ABNORMAL HIGH (ref 0.0–2.0)
Bicarbonate: 30.2 mmol/L — ABNORMAL HIGH (ref 20.0–28.0)
O2 Saturation: 99.3 %
Patient temperature: 36.3
pCO2 arterial: 50 mmHg — ABNORMAL HIGH (ref 32–48)
pH, Arterial: 7.39 (ref 7.35–7.45)
pO2, Arterial: 88 mmHg (ref 83–108)

## 2021-11-03 LAB — PROCALCITONIN: Procalcitonin: 0.21 ng/mL

## 2021-11-03 LAB — BASIC METABOLIC PANEL
Anion gap: 10 (ref 5–15)
BUN: 43 mg/dL — ABNORMAL HIGH (ref 8–23)
CO2: 23 mmol/L (ref 22–32)
Calcium: 8.2 mg/dL — ABNORMAL LOW (ref 8.9–10.3)
Chloride: 92 mmol/L — ABNORMAL LOW (ref 98–111)
Creatinine, Ser: 1.51 mg/dL — ABNORMAL HIGH (ref 0.44–1.00)
GFR, Estimated: 35 mL/min — ABNORMAL LOW (ref >60)
Glucose, Bld: 138 mg/dL — ABNORMAL HIGH (ref 70–99)
Potassium: 4.6 mmol/L (ref 3.5–5.1)
Sodium: 125 mmol/L — ABNORMAL LOW (ref 135–145)

## 2021-11-03 MED ORDER — IPRATROPIUM-ALBUTEROL 0.5-2.5 (3) MG/3ML IN SOLN
3.0000 mL | Freq: Once | RESPIRATORY_TRACT | Status: DC
Start: 2021-11-03 — End: 2021-11-08
  Filled 2021-11-03: qty 3

## 2021-11-03 MED ORDER — FUROSEMIDE 10 MG/ML IJ SOLN
40.0000 mg | Freq: Once | INTRAMUSCULAR | Status: AC
  Administered 2021-11-03: 40 mg via INTRAVENOUS
  Filled 2021-11-03: qty 4

## 2021-11-03 NOTE — Progress Notes (Signed)
   11/03/21 0900  Mobility  Activity Ambulated with assistance in hallway  Level of Assistance Minimal assist, patient does 75% or more  Assistive Device Four wheel walker  Distance Ambulated (ft) 45 ft  Activity Response Tolerated fair  $Mobility charge 1 Mobility   Mobility Specialist Progress Note  Pt was in bed and agreeable. X1 seated break halfway. Returned to chair w/ alarm on and call bell in reach.   Anastasia Pall Mobility Specialist

## 2021-11-03 NOTE — Progress Notes (Signed)
On call team paged to bedside due to patient having desaturations in the mid 80s at rest on 6-8L.  Patient reports that she is not having any chest pain nor does she feel short of breath.   She has had no recent fevers or chills.      11/03/2021   12:00 AM 11/02/2021    8:00 PM 11/02/2021    7:46 PM  Vitals with BMI  Systolic 158 154 423  Diastolic 50 50 43  Pulse 60 59 60   Her O2 saturations were 91-94% with good pleth on 6L [place].   Gen: In NAD CV: Paced, no murmurs. Trace edema of bilateral lower extremities.  Pulm: Mild expiratory wheezing in LLL. Exam somewhat limited by patient's effort.  A/P:   Patient's desaturations are likely multifactorial. She has had them with minimal exertion during the day and on current evaluation they appear to occur when patient goes to sleep. It is likely that there is a component of atelectasis as patein has minimally mobilized since being hospitalized.   Patient has been off of her xarelto as her hgb went to 6.2 requiring 2 units of blood. And again as previously mentioned, she has been minimally mobile in the hospital. Though she denies any chest discomfort. Patient is paced.  PE is on the differential given the above.   Patient does have a diagnosis of COPD v asthma and some mild wheezing was noted on exam. This could also be playing a role.   Patient does have a large neck circumference, is obese, and likely has OSA though she has no formal diagnosis.   Finally, she has a diagnosis of HFpEF, though she appears clinically euvolemic.   Low suspicion for worsening of her pneumonia.  -Repeat CXR -Trial CPAP at night again  -Give duoneb treatment, flutter, IS  -Mobilize with PT -If despite these interventions, patient continues to have desaturations despite being on 6-8L of O2, would recommend repeating Cta chest. Although, resumption of anticoagulation may require inpatient work up of her anemia given it continues decline on most recent CBC.

## 2021-11-03 NOTE — Progress Notes (Signed)
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  Overnight Events: O2 dropped to mid 80s requiring 6-8 L. O2 saturations 91-94% with 6L [name]. Duoneb treatment given and IS. Did not tolerate CPAP. CXR did not show significant change from prior.  Ms. Hannig reports feeling well today. Denies any new concerns. No shortness of breath. Has some confusion about being here, which we discussed her condition.   Objective:  Vital signs in last 24 hours: Vitals:   11/03/21 0407 11/03/21 0424 11/03/21 0613 11/03/21 0744  BP:   (!) 167/44   Pulse:  60 60   Resp:  20 20 16   Temp:   98.4 F (36.9 C)   TempSrc:   Oral   SpO2: 91% 90% 91%   Weight:      Height:       Supplemental O2: 3-4 L [place]  Physical Exam:  Constitutional: chronically ill appearing, sitting in chair, in no acute distress HENT: normocephalic atraumatic. Nasal cannula in place.  Cardiovascular: regular rate and rhythm Pulmonary/Chest: normal work of breathing on 5L via [place], bilateral basilar crackles   Neurological: alert & oriented x 3 Skin: warm and dry Psych: anxious mood, normal behavior  Filed Weights   10/30/21 0344 10/31/21 0022 11/02/21 0035  Weight: 80.3 kg 81 kg 80.7 kg     Intake/Output Summary (Last 24 hours) at 11/03/2021 0818 Last data filed at 11/03/2021 0618 Gross per 24 hour  Intake 859.85 ml  Output 800 ml  Net 59.85 ml    Net IO Since Admission: 1,184.85 mL [11/03/21 0818]  Pertinent Labs:    Latest Ref Rng & Units 11/03/2021    5:24 AM 11/02/2021    4:33 AM 11/01/2021    7:18 AM  CBC  WBC 4.0 - 10.5 K/uL 6.7  6.2  4.2   Hemoglobin 12.0 - 15.0 g/dL 7.0  7.2  7.7    Hematocrit 36.0 - 46.0 % 23.1  24.4  26.0   Platelets 150 - 400 K/uL 199  222  221        Latest Ref Rng & Units 11/03/2021    5:24 AM 11/02/2021    4:33 AM 11/01/2021    7:18 AM  CMP  Glucose 70 - 99 mg/dL 11/03/2021  505  96   BUN 8 - 23 mg/dL 43  44  31   Creatinine 0.44 - 1.00 mg/dL 397  6.73  4.19   Sodium 135 - 145 mmol/L 125  131  131   Potassium 3.5 - 5.1 mmol/L 4.6  4.7  3.8   Chloride 98 - 111 mmol/L 92  95  97   CO2 22 - 32 mmol/L 23  24  26    Calcium 8.9 - 10.3 mg/dL 8.2  8.1  8.3     Imaging: DG CHEST PORT 1 VIEW  Result Date: 11/03/2021 CLINICAL DATA:  Hypoxemia EXAM: PORTABLE CHEST 1 VIEW COMPARISON:  10/29/2021 FINDINGS: Mild cardiomegaly as seen previously. Dual lead pacemaker appears unchanged. Diffuse edema pattern or hazy infiltrates appear similar. No dense consolidation or lobar collapse. No visible effusion. IMPRESSION: No change since 5 days ago. Hazy diffuse pulmonary density persists. Electronically Signed   By:  Paulina Fusi M.D.   On: 11/03/2021 07:59    Assessment/Plan:   Principal Problem:   Acute respiratory failure with hypoxia (HCC) Active Problems:   HTN (hypertension)   Hypothyroidism   COPD (chronic obstructive pulmonary disease) (HCC)   History of pulmonary embolus (PE)   Diabetes mellitus type 2 in obese (HCC)   Heart block AV complete (HCC)   Community acquired pneumonia of right lower lobe of lung   Hypokalemia   Hypomagnesemia   Iron deficiency anemia  Patient Summary: Virginia Summers is a 81 year old female with PMH of PE 2012 on Xarelto, asthma and possible COPD, HFpEF, hypothyroidism, T2DM, HTN, 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  Community acquired pneumococcal pneumonia Acute on chronic hypoxic respiratory failure 2/2 COPD & Asthma Patient  requiring 5-6L O2 via [place], saturating 92-95%. Likely due to IV fluids given yesterday for AKI. She remains afebrile and not appearing in respiratory distress. Completed 5 days of ceftriaxone. Will give IV lasix to help with pulmonary edema. Plan to titrate down O2 back to baseline.  -continue duonebs and breo ellipta  -continue humidified O2 supplementation  -IV lasix 40 mg once  Iron Deficiency Anemia History of PE (2012) on xarelto GERD Hgb decreased to 7.0 today from 7.2. She has not been eating well. Iron panel consistent with IDA. Hx of GERD and was told she needed an endoscopy in the past however this was never done. Low suspicion for acute brisk GI bleed. Once hgb stabilizes, will consider restarting xarelto, will need outpatient follow up. FOBT negative. -protonix 40 mg daily, will continue after discharge -ferric gluconate 250 mg for 2 doses -trend CBC daily, consider transfuse Hgb under 7 -will need follow up with outpatient GI  Hyponatremia Anion Gap Metabolic Acidosis Sodium down 700. Bicarb at 23. Gave LR yesterday. Better appetite today and was eating her breakfast. Not able to tolerate CPAP still for possible OSA vs OHS.  -BMP  Anterior neck swelling Patient still having tenderness along left jawline. CT neck w/o contrast did not show any acute infection or abscess in the neck. Likely lymphadenopathy from recent infection. Sore throat likely from the coughing. She is using the chloraseptic spray.   CKD 3B Creatinine improved 1.51 to 1.74. Gave 500 ml LR yesterday. Continue to monitor.  -BMP  T2DM  Glucose <180. On levemir 20 u, novolog 5 u and SSI.  -continue CBGs -continue levemir 20 u, novolog 5 u, SSI  HTN Systolic BP 160s with MAP in 80-90. On home amlodipine 10 mg and hydralazine 75 mg TID. At home she is on chlorthalidone. Gave 40 mg IV lasix once today.   Diet: Heart Healthy IVF: None,None VTE: SCDs Code: Full PT/OT recs: Home Health PT  Dispo: Anticipated  discharge to Home in 1-2 days pending further work up and improvement in respiratory status.   Rana Snare, DO Internal Medicine Resident PGY-1 Please contact the on call pager after 5 pm and on weekends at [phone].

## 2021-11-03 NOTE — Progress Notes (Signed)
Mobility Specialist Progress Note:   11/03/21 1300  Mobility  Activity Ambulated with assistance in room;Transferred from chair to bed  Level of Assistance Minimal assist, patient does 75% or more  Assistive Device Front wheel walker  Distance Ambulated (ft) 2 ft  Activity Response Tolerated well  $Mobility charge 1 Mobility   Pt received in chair asking to go back to bed. No complaints of pain. Left in bed with call bell in reach and all needs met.   [facility] [place] Mobility Specialist

## 2021-11-03 NOTE — Progress Notes (Signed)
CLINICAL UPDATE:  Received message from RN patient more somnolent, family with concerns. This provider assessed at bedside. Per patient's daughter, she has been more confused and somnolent all day. This reportedly started yesterday and has progressed into today. Denies waxing and waning symptoms.   Patient is afebrile, hemodynamically stable on 5L supplemental oxygen. General: Elderly person sleeping in bed in no acute distress CV: Regular rate, rhythm. No murmurs appreciated. Pulm: Normal work of breathing on room air. Clear to ausculation bilaterally Neuro: Somnolent but easily arousable to verbal stimuli. Oriented to person, place, time. No facial droop or dysarthria. Moving all four extremities spontaneously.  Patient is 80yo person with COPD, FpEF, previous PE here with acute on chronic respiratory failure due to pneumococcal pneumonia. She has had progressively worsening somnolence over the last ~24 hours according to family. Otherwise her exam appears to be unchanged. I do not think this is a structural problem. Patient did take hydroxyzine last night, and with her low GFR this could be contributing. In addition, she has a history of COPD, possible her somnolence is due to hypercarbia. We'll get an ABG and hold any further centrally-acting medications.   - Follow-up ABG - Hold further centrally-acting meds  Evlyn Kanner, MD Internal Medicine PGY-3 Pager: [phone]

## 2021-11-03 NOTE — Progress Notes (Signed)
Physical Therapy Treatment Patient Details Name: Virginia Summers MRN: [id number] DOB: [birth year] Today's Date: 11/03/2021   History of Present Illness Virginia Summers is a 81 year old female with past medical history of PE 2012 on Xarelto, asthma and possible COPD, HFpEF, hypothyroidism, type 2 diabetes, hypertension, complete heart block status post pacemaker placement 08/2021, CKD 3B, chronic hypoxia on 2-3 L at home, who presented to the emergency room for acute episode of shortness of breath.  Found to have multifocal PNA and CHF exacerbation.    PT Comments    The pt was agreeable to session despite reports of fatigue after using bathroom. Upon arrival, pt ambulating ~8 ft from bathroom to bed on 5L O2 with SpO2 reading 79%. The pt recovered on 8L O2 with seated rest, then attempted additional ambulation on 6L with use of rollator. SpO2 again dropped to low of 79% after ~18 ft hallway ambulation and required seated rest and increase to 8L to recover to 90s. The pt will continue to benefit from max skilled PT and mobility to progress endurance and activity tolerance. Discussed plan with daughter who expressed good understanding of how to progress mobility and endurance at home.     Recommendations for follow up therapy are one component of a multi-disciplinary discharge planning process, led by the attending physician.  Recommendations may be updated based on patient status, additional functional criteria and insurance authorization.  Follow Up Recommendations  Home health PT     Assistance Recommended at Discharge Intermittent Supervision/Assistance  Patient can return home with the following A little help with walking and/or transfers;A little help with bathing/dressing/bathroom;Assistance with cooking/housework;Help with stairs or ramp for entrance;Direct supervision/assist for medications management;Assist for transportation   Equipment Recommendations  None recommended by PT     Recommendations for Other Services       Precautions / Restrictions Precautions Precautions: Fall Precaution Comments: watch SpO2 Restrictions Weight Bearing Restrictions: No     Mobility  Bed Mobility Overal bed mobility: Needs Assistance Bed Mobility: Sit to Supine       Sit to supine: Min assist   General bed mobility comments: minA to assist with movement of LE into bed    Transfers Overall transfer level: Needs assistance Equipment used: Rollator (4 wheels) Transfers: Sit to/from Stand Sit to Stand: Min guard           General transfer comment: minG to complete sit-stand x3 in session. The pt did benefit from cues for hand placement    Ambulation/Gait Ambulation/Gait assistance: Min assist Gait Distance (Feet): 8 Feet (+ 18 ft + 18 ft) Assistive device: Rollator (4 wheels) Gait Pattern/deviations: Step-to pattern, Decreased stride length, Shuffle, Trunk flexed Gait velocity: decreased Gait velocity interpretation: <1.31 ft/sec, indicative of household ambulator   General Gait Details: very limited by fatigue and SOB, benefits from max encouragement. ambulating from bathroom to bed upon arrival, then completed 2 additional bouts of mobility with increased time and seated rest breaks. SpO2 to low of 79% on 6L.        Balance Overall balance assessment: Needs assistance Sitting-balance support: No upper extremity supported Sitting balance-Leahy Scale: Good     Standing balance support: Bilateral upper extremity supported Standing balance-Leahy Scale: Poor Standing balance comment: UE support for balance                            Cognition Arousal/Alertness: Lethargic Behavior During Therapy: WFL for tasks assessed/performed Overall Cognitive Status: Within  Functional Limits for tasks assessed                                 General Comments: very HOH making it hard to assess cognition because she often answers questions  inappropriately due to not hearing correctly.        Exercises      General Comments General comments (skin integrity, edema, etc.): SpO2 to low of 79% on 6L with ambulation requiring 8L to recover with seated rest.      Pertinent Vitals/Pain Pain Assessment Pain Assessment: No/denies pain Pain Intervention(s): Monitored during session     PT Goals (current goals can now be found in the care plan section) Acute Rehab PT Goals Patient Stated Goal: to return to independent, feel better PT Goal Formulation: With patient Time For Goal Achievement: 11/14/21 Potential to Achieve Goals: Fair Progress towards PT goals: Progressing toward goals    Frequency    Min 3X/week      PT Plan Current plan remains appropriate       AM-PAC PT "6 Clicks" Mobility   Outcome Measure  Help needed turning from your back to your side while in a flat bed without using bedrails?: A Little Help needed moving from lying on your back to sitting on the side of a flat bed without using bedrails?: A Little Help needed moving to and from a bed to a chair (including a wheelchair)?: A Little Help needed standing up from a chair using your arms (e.g., wheelchair or bedside chair)?: A Little Help needed to walk in hospital room?: A Little Help needed climbing 3-5 steps with a railing? : Total 6 Click Score: 16    End of Session Equipment Utilized During Treatment: Gait belt;Oxygen Activity Tolerance: Patient limited by fatigue Patient left: in bed;with call bell/phone within reach;with nursing/sitter in room Nurse Communication: Mobility status PT Visit Diagnosis: Other abnormalities of gait and mobility (R26.89);Muscle weakness (generalized) (M62.81)     Time: 1610-9604 PT Time Calculation (min) (ACUTE ONLY): 34 min  Charges:  $Gait Training: 8-22 mins $Therapeutic Exercise: 8-22 mins                     Vickki Muff, PT, DPT   Acute Rehabilitation Department   Ronnie Derby 11/03/2021,  4:02 PM

## 2021-11-04 DIAGNOSIS — J9601 Acute respiratory failure with hypoxia: Secondary | ICD-10-CM | POA: Diagnosis not present

## 2021-11-04 DIAGNOSIS — E1169 Type 2 diabetes mellitus with other specified complication: Secondary | ICD-10-CM | POA: Diagnosis not present

## 2021-11-04 DIAGNOSIS — J440 Chronic obstructive pulmonary disease with acute lower respiratory infection: Secondary | ICD-10-CM | POA: Diagnosis not present

## 2021-11-04 DIAGNOSIS — J189 Pneumonia, unspecified organism: Secondary | ICD-10-CM | POA: Diagnosis not present

## 2021-11-04 LAB — BASIC METABOLIC PANEL
Anion gap: 10 (ref 5–15)
BUN: 38 mg/dL — ABNORMAL HIGH (ref 8–23)
CO2: 27 mmol/L (ref 22–32)
Calcium: 8.4 mg/dL — ABNORMAL LOW (ref 8.9–10.3)
Chloride: 93 mmol/L — ABNORMAL LOW (ref 98–111)
Creatinine, Ser: 1.21 mg/dL — ABNORMAL HIGH (ref 0.44–1.00)
GFR, Estimated: 45 mL/min — ABNORMAL LOW (ref >60)
Glucose, Bld: 141 mg/dL — ABNORMAL HIGH (ref 70–99)
Potassium: 4.1 mmol/L (ref 3.5–5.1)
Sodium: 130 mmol/L — ABNORMAL LOW (ref 135–145)

## 2021-11-04 LAB — CBC
HCT: 24.3 % — ABNORMAL LOW (ref 36.0–46.0)
Hemoglobin: 7.3 g/dL — ABNORMAL LOW (ref 12.0–15.0)
MCH: 21.6 pg — ABNORMAL LOW (ref 26.0–34.0)
MCHC: 30 g/dL (ref 30.0–36.0)
MCV: 71.9 fL — ABNORMAL LOW (ref 80.0–100.0)
Platelets: 212 10*3/uL (ref 150–400)
RBC: 3.38 MIL/uL — ABNORMAL LOW (ref 3.87–5.11)
RDW: 17 % — ABNORMAL HIGH (ref 11.5–15.5)
WBC: 7.2 10*3/uL (ref 4.0–10.5)
nRBC: 0.4 % — ABNORMAL HIGH (ref 0.0–0.2)

## 2021-11-04 LAB — GLUCOSE, CAPILLARY
Glucose-Capillary: 153 mg/dL — ABNORMAL HIGH (ref 70–99)
Glucose-Capillary: 103 mg/dL — ABNORMAL HIGH (ref 70–99)
Glucose-Capillary: 298 mg/dL — ABNORMAL HIGH (ref 70–99)
Glucose-Capillary: 71 mg/dL (ref 70–99)

## 2021-11-04 MED ORDER — DEXTROSE 50 % IV SOLN: 12.5000 g | Freq: Once | INTRAVENOUS | Status: DC | PRN

## 2021-11-04 MED ORDER — LIDOCAINE VISCOUS HCL 2 % MT SOLN
15.0000 mL | Freq: Once | OROMUCOSAL | Status: AC
Start: 2021-11-04 — End: 2021-11-04
  Administered 2021-11-04: 15 mL via OROMUCOSAL
  Filled 2021-11-04: qty 15

## 2021-11-04 MED ORDER — FUROSEMIDE 10 MG/ML IJ SOLN
60.0000 mg | Freq: Once | INTRAMUSCULAR | Status: DC
Start: 2021-11-04 — End: 2021-11-04

## 2021-11-04 NOTE — Progress Notes (Signed)
Subjective: I went to reassess the patient to see if she was still somnolent. Patient is still somnolent.  Daughter states that the patient has been complaining of pain on her lateral tongue bilaterally due to sores.  Physical Exam: Constitutional: chronically ill-appearing, lying in bed. Not in acute distress. HENT: approximately 1 cm sore on the lateral tongue bilaterally. No active bleeding or drainage. No erythema, thrush, or leukoplakia.  Cardiovascular: regular rate and rhythm. No murmurs, rubs, or gallops.  Pulmonary: Pursed-lip breathing. No accessory muscle use. Minimal bilateral basilar crackles. On 2L [place]. Neurological: somnolent, opens eyes and responds verbally with only a few words following sternal rub.  MSK: Minimal LE edema bilaterally.  Skin: warm and dry. Decreased skin turgor.   Plan:  # Mouth Sores The patient's sores did not appear to be secondary to thrush or leukoplakia.  I believe the patient's sores are secondary to the patient's mouth-breathing related to her potential OSA.  Patient demonstrates pursed-lip breathing and I believe this is causing her tongue to rub up against her teeth. - Will order viscous lidocaine  - Will reevaluate

## 2021-11-04 NOTE — Progress Notes (Signed)
Palliative:  Consult received and chart reviewed. Briefly spoke to daughter Clydie Braun via telephone.  Goals of care meeting scheduled with patient's daughters 8/25 at 13:00  Gerlean Ren, DNP, [facility] Palliative Medicine Team Team Phone # [phone]  Pager # [phone]  NO CHARGE

## 2021-11-04 NOTE — Progress Notes (Addendum)
Subjective:   Patient continues to be somnolent. Opens her eyes and responds verbally with only a few words to sternal rub then closes her eyes. Dr. Evie Lacks and I had a long discussion with both of the patient's daughters.  Her daughter stated that she has been experiencing increased sleepiness over the last 2 months.  She also states that the patient has not been eating much at home with her diet mostly consisting of fruit.  She states that she has difficulty getting the patient to her appointments as an outpatient as the patient either becomes sick or is nervous about leaving the house given her previous history of a fall and cardiac arrest.  She states that the patient's health has continued to deteriorate since she was infected with COVID last year.  She states that the patient has not had any formal outpatient testing for asthma or COPD but does receive oxygen from her primary care provider.  She states that the patient has not tolerated CPAP in the past and believes that she will continue to remove the mask from her face at night.  We discussed the option of speaking with palliative care to discuss her goals of care going forward.  Both the daughters agree with this plan.  Objective:  Vital signs in last 24 hours: Vitals:   11/04/21 0800 11/04/21 0900 11/04/21 1000 11/04/21 1202  BP: (!) 176/57     Pulse: 65     Resp:      Temp:      TempSrc:      SpO2: 96% 97% (!) 76% 91%  Weight:      Height:       Physical Exam:  Constitutional: chronically ill-appearing, lying in bed. Not in acute distress. Cardiovascular: regular rate and rhythm. No murmurs, rubs, or gallops.  Pulmonary: Pursed-lip breathing. No accessory muscle use. Minimal bilateral basilar crackles. On 2L [place]. Neurological: somnolent, opens eyes and responds verbally with only a few words following sternal rub.  MSK: Minimal LE edema bilaterally.  Skin: warm and dry. Decreased skin turgor.    Assessment/Plan:  Principal Problem:   Acute respiratory failure with hypoxia (HCC) Active Problems:   HTN (hypertension)   Hypothyroidism   COPD (chronic obstructive pulmonary disease) (HCC)   History of pulmonary embolus (PE)   Diabetes mellitus type 2 in obese (HCC)   Heart block AV complete (HCC)   Community acquired pneumonia of right lower lobe of lung   Hypokalemia   Hypomagnesemia   Iron deficiency anemia   Acute on chronic diastolic heart failure (HCC)  Virginia Summers is a 81 y/o female with past medical history of PE (2012, on Xarelto), asthma w/ possible COPD, chronic hypoxia on 2L at home, HFpEF, hypothyroidism, T2DM, HTN, complete heart block s/p pacemaker 08/2021, and CKD 3B that presented for acute SOB and admitted for acute on chronic respiratory failure secondary to pneumococcal pnuemonia.   # Community acquired pneumococcal pneumonia # Acute on chronic hypoxic respiratory failure 2/2 COPD & Asthma SpO2 90-96% on 2L . Completed 5-day course of ceftriaxone. Patient remains afebrile. Recent worsening of respiratory status was likely secondary to pulmonary edema from IV fluids given on 8/22 for AKI. Patient received 1X dose of IV Lasix 40 mg on 8/23. No urine output recorded for yesterday but patient appears dry today. Breathing appears to have improved.  - Continue duonebs and breo ellipta  - Continue supplemental O2 at home 2L    2. # Iron Deficiency Anemia # History  of PE (2012) on xarelto # GERD Iron studies consistent with IDA. FOBT negative. Hemoglobin improved to 7.3 today after receving 2X doses of ferric gluconate 250 mg yesterday. Xarelto held currently. Will consider restarting Xarelto once Hgb stabilizes. Patient has not had endoscopy done outpatient. Will need to follow-up with GI as an outpatient. - Protonix 40 mg daily (will continue after discharge) - Trend CBC daily - Consider transfusion if Hgb < 7    3. # Hyponatremia # Anion Gap Metabolic  Acidosis Sodium improved to 130 today. Bicarb at 30.2 last night. Patient is still not tolerating CPAP for potential OSA vs OHS. Lack of treatment with CPAP likely contributing to patients somnolence. Patient continues to be somnolent.  - Trend BMP   4. # Anterior neck swelling # Sore Throat CT neck negative for acute infection or abscess. Likely secondary to lymphadenopathy from recent infection. Sore throat likely secondary to coughing.  - Chloraseptic spray    5. # CKD 3B Creatinine continues to improve. Creatinine 1.21 today. - Trend BMP   6. # T2DM  Goal of glucose <180. Glucose continues to be in the low to mid 100s. Only two readings in the low 200s over the last 24 hours. - Levemir 20 u - Novolog 5 u - Sliding Scale  - Continue CBG monitoring   7. # HTN Systolic blood pressure continues to be in the 160s-170s.  Diastolic remains in the 40s-50s. Could consider restarting home chlorthalidone if pressures continue to be elevated.  - Home amlodipine 10 mg - Home hydralazine 75 mg TID  8. # Disposition Had a lengthy discussion with both daughters regarding the patient's condition over the last year.  We agreed upon meeting with palliative care to discuss the patient's goals of care. - Ordered palliative care consult   Diet: Heart Healthy Bowel: Miralax  IVF: None VTE: SCDs Code: Full   Prior to Admission Living Arrangement: home Anticipated Discharge Location: home Barriers to Discharge: continued management Dispo: Anticipated discharge in approximately less than 2 day(s).   Karoline Caldwell, MD 11/04/2021, 12:20 PM Pager: [phone] After 5pm on weekdays and 1pm on weekends: On Call pager [phone]

## 2021-11-04 NOTE — Progress Notes (Signed)
CPAP removed at 2330, replaced with 3L [place].

## 2021-11-05 DIAGNOSIS — J449 Chronic obstructive pulmonary disease, unspecified: Secondary | ICD-10-CM | POA: Diagnosis not present

## 2021-11-05 DIAGNOSIS — E871 Hypo-osmolality and hyponatremia: Secondary | ICD-10-CM

## 2021-11-05 DIAGNOSIS — Z6835 Body mass index (BMI) 35.0-35.9, adult: Secondary | ICD-10-CM

## 2021-11-05 DIAGNOSIS — Z515 Encounter for palliative care: Secondary | ICD-10-CM | POA: Diagnosis not present

## 2021-11-05 DIAGNOSIS — I129 Hypertensive chronic kidney disease with stage 1 through stage 4 chronic kidney disease, or unspecified chronic kidney disease: Secondary | ICD-10-CM | POA: Diagnosis not present

## 2021-11-05 DIAGNOSIS — Z7189 Other specified counseling: Secondary | ICD-10-CM | POA: Diagnosis not present

## 2021-11-05 DIAGNOSIS — J9601 Acute respiratory failure with hypoxia: Secondary | ICD-10-CM | POA: Diagnosis not present

## 2021-11-05 DIAGNOSIS — J13 Pneumonia due to Streptococcus pneumoniae: Secondary | ICD-10-CM | POA: Diagnosis not present

## 2021-11-05 LAB — BASIC METABOLIC PANEL
Anion gap: 5 (ref 5–15)
BUN: 25 mg/dL — ABNORMAL HIGH (ref 8–23)
CO2: 31 mmol/L (ref 22–32)
Calcium: 8.2 mg/dL — ABNORMAL LOW (ref 8.9–10.3)
Chloride: 97 mmol/L — ABNORMAL LOW (ref 98–111)
Creatinine, Ser: 0.85 mg/dL (ref 0.44–1.00)
GFR, Estimated: >60 mL/min (ref >60)
Glucose, Bld: 106 mg/dL — ABNORMAL HIGH (ref 70–99)
Potassium: 3.5 mmol/L (ref 3.5–5.1)
Sodium: 133 mmol/L — ABNORMAL LOW (ref 135–145)

## 2021-11-05 LAB — CBC
HCT: 24.2 % — ABNORMAL LOW (ref 36.0–46.0)
Hemoglobin: 7.1 g/dL — ABNORMAL LOW (ref 12.0–15.0)
MCH: 21.8 pg — ABNORMAL LOW (ref 26.0–34.0)
MCHC: 29.3 g/dL — ABNORMAL LOW (ref 30.0–36.0)
MCV: 74.5 fL — ABNORMAL LOW (ref 80.0–100.0)
Platelets: 206 10*3/uL (ref 150–400)
RBC: 3.25 MIL/uL — ABNORMAL LOW (ref 3.87–5.11)
RDW: 17.2 % — ABNORMAL HIGH (ref 11.5–15.5)
WBC: 4.7 10*3/uL (ref 4.0–10.5)
nRBC: 0.8 % — ABNORMAL HIGH (ref 0.0–0.2)

## 2021-11-05 LAB — GLUCOSE, CAPILLARY
Glucose-Capillary: 119 mg/dL — ABNORMAL HIGH (ref 70–99)
Glucose-Capillary: 215 mg/dL — ABNORMAL HIGH (ref 70–99)
Glucose-Capillary: 143 mg/dL — ABNORMAL HIGH (ref 70–99)
Glucose-Capillary: 219 mg/dL — ABNORMAL HIGH (ref 70–99)

## 2021-11-05 MED ORDER — LIDOCAINE VISCOUS HCL 2 % MT SOLN
15.0000 mL | Freq: Four times a day (QID) | OROMUCOSAL | Status: DC | PRN
  Administered 2021-11-06: 15 mL via OROMUCOSAL
  Filled 2021-11-05 (×3): qty 15

## 2021-11-05 MED ORDER — FUROSEMIDE 10 MG/ML IJ SOLN
40.0000 mg | Freq: Every day | INTRAMUSCULAR | Status: DC
  Administered 2021-11-05 – 2021-11-08 (×4): 40 mg via INTRAVENOUS
  Filled 2021-11-05 (×4): qty 4

## 2021-11-05 MED ORDER — FUROSEMIDE 10 MG/ML IJ SOLN: 40.0000 mg | Freq: Once | INTRAMUSCULAR | Status: DC

## 2021-11-05 MED ORDER — RAMELTEON 8 MG PO TABS
8.0000 mg | ORAL_TABLET | Freq: Every day | ORAL | Status: DC
  Administered 2021-11-05: 8 mg via ORAL
  Filled 2021-11-05 (×2): qty 1

## 2021-11-05 NOTE — Care Management Important Message (Signed)
Important Message  Patient Details  Name: Virginia Summers MRN: [id number] Date of Birth: 26-May-1940   Medicare Important Message Given:  Yes     Renie Ora 11/05/2021, 10:32 AM

## 2021-11-05 NOTE — Progress Notes (Signed)
Physical Therapy Treatment Patient Details Name: KYLEA BERRONG MRN: [id number] DOB: Jun 05, 1940 Today's Date: 11/05/2021   History of Present Illness Esteen Poitra is a 81 year old female with past medical history of PE 2012 on Xarelto, asthma and possible COPD, HFpEF, hypothyroidism, type 2 diabetes, hypertension, complete heart block status post pacemaker placement 08/2021, CKD 3B, chronic hypoxia on 2-3 L at home, who presented to the emergency room for acute episode of shortness of breath.  Found to have multifocal PNA and CHF exacerbation.    PT Comments    Pt with improved tolerance for ambulation, although continuing to desaturate despite use of up to 6L of oxygen during session. PT reinforces education on the use of pursed lip breathing, as well as encouragement for a progressive increase in ambulation distance and overall activity. Pt will benefit from continued frequent mobility in an effort to return toward her prior level of function.   Recommendations for follow up therapy are one component of a multi-disciplinary discharge planning process, led by the attending physician.  Recommendations may be updated based on patient status, additional functional criteria and insurance authorization.  Follow Up Recommendations  Home health PT     Assistance Recommended at Discharge Intermittent Supervision/Assistance  Patient can return home with the following A little help with walking and/or transfers;A little help with bathing/dressing/bathroom;Assistance with cooking/housework;Help with stairs or ramp for entrance;Direct supervision/assist for medications management;Assist for transportation   Equipment Recommendations  None recommended by PT    Recommendations for Other Services       Precautions / Restrictions Precautions Precautions: Fall Precaution Comments: watch SpO2 Restrictions Weight Bearing Restrictions: No     Mobility  Bed Mobility Overal bed mobility: Needs  Assistance Bed Mobility: Supine to Sit     Supine to sit: Min guard, HOB elevated          Transfers Overall transfer level: Needs assistance Equipment used: Rolling walker (2 wheels) Transfers: Sit to/from Stand Sit to Stand: Min guard                Ambulation/Gait Ambulation/Gait assistance: Min guard Gait Distance (Feet): 30 Feet (additional trial of 20') Assistive device: Rolling walker (2 wheels) Gait Pattern/deviations: Step-through pattern Gait velocity: decreased Gait velocity interpretation: <1.31 ft/sec, indicative of household ambulator   General Gait Details: pt with slowed step-through gait, one brief standing rest break with each gait trial   Stairs             Wheelchair Mobility    Modified Rankin (Stroke Patients Only)       Balance Overall balance assessment: Needs assistance Sitting-balance support: No upper extremity supported, Feet supported Sitting balance-Leahy Scale: Good     Standing balance support: Single extremity supported, Bilateral upper extremity supported, Reliant on assistive device for balance Standing balance-Leahy Scale: Poor                              Cognition Arousal/Alertness: Awake/alert Behavior During Therapy: WFL for tasks assessed/performed Overall Cognitive Status: Difficult to assess                                 General Comments: pt is very hard of hearing, difficult to discern if pt has trouble hearing PT at times or if she has delayed processing and impaired memory        Exercises  General Comments General comments (skin integrity, edema, etc.): pt desats when mobilizing on 4L [place] during initial trial and on 6L [place] during 2nd ambulation trial, dropping to low 80s. With cues for pursed lip breathing and seated rest break pt recovers into 90s within 1 minute of cessation of mobility.      Pertinent Vitals/Pain Pain Assessment Pain Assessment: No/denies pain     Home Living                          Prior Function            PT Goals (current goals can now be found in the care plan section) Acute Rehab PT Goals Patient Stated Goal: to return to independent, feel better Progress towards PT goals: Progressing toward goals    Frequency    Min 3X/week      PT Plan Current plan remains appropriate    Co-evaluation              AM-PAC PT "6 Clicks" Mobility   Outcome Measure  Help needed turning from your back to your side while in a flat bed without using bedrails?: A Little Help needed moving from lying on your back to sitting on the side of a flat bed without using bedrails?: A Little Help needed moving to and from a bed to a chair (including a wheelchair)?: A Little Help needed standing up from a chair using your arms (e.g., wheelchair or bedside chair)?: A Little Help needed to walk in hospital room?: A Little Help needed climbing 3-5 steps with a railing? : Total 6 Click Score: 16    End of Session Equipment Utilized During Treatment: Oxygen Activity Tolerance: Patient limited by fatigue Patient left: in chair;with call bell/phone within reach;with chair alarm set Nurse Communication: Mobility status PT Visit Diagnosis: Other abnormalities of gait and mobility (R26.89);Muscle weakness (generalized) (M62.81)     Time: 3536-1443 PT Time Calculation (min) (ACUTE ONLY): 34 min  Charges:  $Gait Training: 8-22 mins $Therapeutic Activity: 8-22 mins                     Arlyss Gandy, PT, DPT Acute Rehabilitation Office [phone]    Arlyss Gandy 11/05/2021, 11:58 AM

## 2021-11-05 NOTE — TOC Initial Note (Signed)
Transition of Care [facility]) - Initial/Assessment Note    Patient Details  Name: Virginia Summers MRN: [id number] Date of Birth: 1940-05-05  [facility]) CM/SW Contact:    Leone Haven, RN Phone Number: 11/05/2021, 4:19 PM  Clinical Narrative:                 NCM spoke with Angie, Patient's daughter, she handed the phone to her other sister who patient lives with.  She states patient has a walker and cane at home.  She states they do not want  a hospital bed right now, patient is good with her bed since it is low.  NCM confirmed her choice for Home Hospice, which is [facility].  NCM made referral to [facility] with [facility], daughter states to have her to call [phone].  Patient will need ambulance transport at dc, address in chart confirmed with daughter.    Expected Discharge Plan: Home w Hospice Care Barriers to Discharge: Continued Medical Work up   Patient Goals and CMS Choice Patient states their goals for this hospitalization and ongoing recovery are:: home with hospice CMS Medicare.gov Compare Post Acute Care list provided to:: Patient Represenative (must comment) Choice offered to / list presented to : Adult Children  Expected Discharge Plan and Services Expected Discharge Plan: Home w Hospice Care In-house Referral: Hospice / Palliative Care Discharge Planning Services: CM Consult Post Acute Care Choice: Hospice Living arrangements for the past 2 months: Single Family Home                 DME Arranged:  (DME wil be provided by Hospice)         HH Arranged: RN, Disease Management, PT, OT HH Agency: [facility] ([facility]) Date HH Agency Contacted: 11/05/21 Time HH Agency Contacted: 1617 Representative spoke with at [facility] Agency: Shanita  Prior Living Arrangements/Services Living arrangements for the past 2 months: Single Family Home Lives with:: Adult Children Patient language and need for interpreter reviewed::  Yes Do you feel safe going back to the place where you live?: Yes      Need for Family Participation in Patient Care: Yes (Comment) Care giver support system in place?: Yes (comment) Current home services: DME (cane, walker, home oxygen with [employer]) Criminal Activity/Legal Involvement Pertinent to Current Situation/Hospitalization: No - Comment as needed  Activities of Daily Living Home Assistive Devices/Equipment: CBG Meter ADL Screening (condition at time of admission) Patient's cognitive ability adequate to safely complete daily activities?: Yes Is the patient deaf or have difficulty hearing?: No Does the patient have difficulty seeing, even when wearing glasses/contacts?: No Does the patient have difficulty concentrating, remembering, or making decisions?: No Patient able to express need for assistance with ADLs?: No Does the patient have difficulty dressing or bathing?: No Independently performs ADLs?: Yes (appropriate for developmental age) Does the patient have difficulty walking or climbing stairs?: Yes Weakness of Legs: Both Weakness of Arms/Hands: None  Permission Sought/Granted Permission sought to share information with : Case Manager, Family Supports Permission granted to share information with : Yes, Verbal Permission Granted     Permission granted to share info w AGENCY: [facility],Karen        Emotional Assessment Appearance:: Appears stated age       Alcohol / Substance Use: Not Applicable Psych Involvement: No (comment)  Admission diagnosis:  Hypokalemia [E87.6] Hypomagnesemia [E83.42] Acute respiratory failure with hypoxia (HCC) [J96.01] Acute hypoxemic respiratory failure (HCC) [J96.01] Community acquired pneumonia of  right lower lobe of lung [J18.9] Patient Active Problem List   Diagnosis Date Noted   Acute on chronic diastolic heart failure (HCC)    Iron deficiency anemia    Community acquired pneumonia of right lower lobe of lung    Hypokalemia     Hypomagnesemia    Coagulase negative Staphylococcus bacteremia    Heart block AV complete (HCC)    Bradycardia 08/27/2021   Acute respiratory failure (HCC) 08/27/2021   Renal failure    Hyperkalemia    Respiratory failure (HCC) 11/19/2020   Acute respiratory failure with hypoxemia (HCC) 11/17/2020   Hypertensive urgency 11/17/2020   Diabetes mellitus type 2 in obese (HCC) 11/17/2020   Acute respiratory failure with hypoxia (HCC) 11/17/2020   COVID-19 virus infection 11/17/2020   Precordial chest pain    Palpitations 04/17/2020   Morbid obesity (HCC) 06/04/2019   COPD (chronic obstructive pulmonary disease) (HCC) 05/10/2019   History of pulmonary embolus (PE) 05/10/2019   Hiatal hernia 05/12/2011   Hypothyroidism 08/10/2010   Cramp 06/09/2010   Pulmonary HTN (HCC) 06/09/2010   Pulmonary embolism (HCC)    HTN (hypertension)    Anticoagulant long-term use    PCP:  Eather Colas, FNP Pharmacy:   [facility] [address] ([address]), [place] - [address] [place] ([place]) [place] [address] Phone: [phone] Fax: [phone]  [facility] [address] [place] [address] Phone: [phone] Fax: [phone]     Social Determinants of Health (SDOH) Interventions    Readmission Risk Interventions    11/01/2021   12:33 PM  Readmission Risk Prevention Plan  Transportation Screening Complete  PCP or Specialist Appt within 3-5 Days Complete  HRI or Home Care Consult Complete  Social Work Consult for Recovery Care Planning/Counseling Complete  Palliative Care Screening Not Applicable  Medication Review Oceanographer) Referral to Pharmacy

## 2021-11-05 NOTE — Consult Note (Signed)
Consultation Note Date: 11/05/2021   Patient Name: Virginia Summers  DOB: [birth year]  MRN: [id number]  Age / Sex: 81 y.o., female  PCP: Deborah Chalk, FNP Referring Physician: Lucious Groves, DO  Reason for Consultation: Establishing goals of care  HPI/Patient Profile: 81 y.o. female  with past medical history of PE 2012 on Xarelto, asthma and possible COPD, HFpEF, hypothyroidism, T2DM, HTN, complete heart block s/p pacemaker 08/2021, CKD 3B, and chronic hypoxia on 2-3L at home admitted on 10/29/2021 with acute episode of shortness of breath. Diagnosed with community-acquired pneumococcal pneumonia.  Patient also with iron deficiency anemia and required 1 unit of PRBCs.  Patient with increased lethargy throughout hospitalization and poor p.o. intake.  PMT consulted to discuss goals of care.  Clinical Assessment and Goals of Care: I have reviewed medical records including EPIC notes, labs and imaging, received report from Dr. Markus Jarvis, assessed the patient and then met with patient's family to include 3 daughters Joseph Art, [place], and Santiago Glad) and son-in-law to discuss diagnosis prognosis, GOC, EOL wishes, disposition and options.  I introduced Palliative Medicine as specialized medical care for people living with serious illness. It focuses on providing relief from the symptoms and stress of a serious illness. The goal is to improve quality of life for both the patient and the family.  We discussed a brief life review of the patient.  Family describes patient as funny and lively.   As far as functional and nutritional status family shares of a decline. They tell me patient had COVID in September of 2022 and has been declining since that time. They tell me of multiple hospitalizations over the past year with a decline in her baseline status following each hospitalization.    We discussed patient's current illness and what it means in the larger context of  patient's on-going co-morbidities.  Natural disease trajectory and expectations at EOL were discussed.  We discussed her multiple chronic conditions along with decline in functional and nutritional status.  I attempted to elicit values and goals of care important to the patient.  Family shares patient would like to be at home and have good quality time at home.  She would like to avoid repeated hospitalizations.  The difference between aggressive medical intervention and comfort care was considered in light of the patient's goals of care.  Detailed aggressive medical path versus comfort path for them family all agrees that comfort care path is more appropriate.  We did discuss CODE STATUS. Encouraged patient/family to consider DNR/DNI status understanding evidenced based poor outcomes in similar hospitalized patients, as the cause of the arrest is likely associated with chronic/terminal disease rather than a reversible acute cardio-pulmonary event.  Family seems to understand the recommendation for DNR/DNI but they would like patient to make decision.  We attempted multiple conversations with patient about this but she was having difficulty following conversation.  Ultimately family request that she remain full code for this time and will continue conversations at home.  Hard choices books given to patient and family to help facilitate conversations at home.  Discussed with family the importance of continued conversation with family and the medical providers regarding overall plan of care and treatment options, ensuring decisions are within the context of the patients values and GOCs.    Hospice services outpatient were explained and offered.  Family interested in the hospice care at home -we specifically discussed hospice philosophy of care focused on comfort and quality of life.  Family interested in [place].  Questions and concerns were addressed. The family was encouraged to call with questions  or concerns.  Primary Decision Maker NEXT OF KIN - daughters    SUMMARY OF RECOMMENDATIONS   - home with hospice - to remain full code for now - continue conversations outpatient - hard choices booklets given to help facilitate conversations  Code Status/Advance Care Planning: Full code  Discharge Planning: Home with Hospice      Primary Diagnoses: Present on Admission:  Acute respiratory failure with hypoxia ([place])  Diabetes mellitus type 2 in obese ([place])  Heart block AV complete ([place])  History of pulmonary embolus (PE)  HTN (hypertension)  Hypothyroidism   I have reviewed the medical record, interviewed the patient and family, and examined the patient. The following aspects are pertinent.  Past Medical History:  Diagnosis Date   Anticoagulant long-term use    Chest pain    Diabetes mellitus    Diverticulitis    DVT (deep venous thrombosis) ([place])    BILATERAL, March 2012   GERD (gastroesophageal reflux disease)    HTN (hypertension)    Hypothyroidism    Pulmonary embolism (HCC)    Social History   Socioeconomic History   Marital status: Married    Spouse name: Not on file   Number of children: Not on file   Years of education: Not on file   Highest education level: Not on file  Occupational History   Not on file  Tobacco Use   Smoking status: Former    Packs/day: 0.50    Years: 30.00    Total pack years: 15.00    Types: Cigarettes    Quit date: 03/15/2003    Years since quitting: 18.6   Smokeless tobacco: Never  Substance and Sexual Activity   Alcohol use: No   Drug use: No   Sexual activity: Not on file  Other Topics Concern   Not on file  Social History Narrative   Not on file   Social Determinants of Health   Financial Resource Strain: Not on file  Food Insecurity: Not on file  Transportation Needs: Not on file  Physical Activity: Not on file  Stress: Not on file  Social Connections: Not on file   Family History  Problem Relation Age  of Onset   Transient ischemic attack Father    Coronary artery disease Father    Stroke Mother    Diabetes Mother    Diabetes Sister    Hypertension Daughter    Scheduled Meds:  amLODipine  10 mg Oral Daily   DULoxetine  60 mg Oral QHS   fluticasone furoate-vilanterol  1 puff Inhalation Daily   furosemide  40 mg Intravenous Daily   hydrALAZINE  75 mg Oral TID   insulin aspart  0-15 Units Subcutaneous TID WC   insulin aspart  5 Units Subcutaneous TID WC   insulin detemir  20 Units Subcutaneous QHS   ipratropium-albuterol  3 mL Nebulization Once   levothyroxine  25 mcg Oral QAC breakfast   pantoprazole  40 mg Oral Daily   Or   pantoprazole  40 mg Oral Daily   ramelteon  8 mg Oral QHS   Continuous Infusions: PRN Meds:.acetaminophen, dextrose, ipratropium-albuterol, lidocaine, menthol-cetylpyridinium, ondansetron (ZOFRAN) IV, phenol, polyethylene glycol Allergies  Allergen Reactions   Symbicort [Budesonide-Formoterol Fumarate] Other (See Comments)    Shaking/ jittery feeling   Penicillins Swelling and Rash    Did it involve swelling of the face/tongue/throat, SOB, or low BP? No Did it involve  sudden or severe rash/hives, skin peeling, or any reaction on the inside of your mouth or nose? No Did you need to seek medical attention at a hospital or doctor's office? No When did it last happen?    childhood   If all above answers are "NO", may proceed with cephalosporin use.   Sulfa Drugs Cross Reactors Swelling and Rash   Review of Systems  Constitutional:  Positive for activity change, appetite change and fatigue.  Respiratory:  Positive for shortness of breath.   Psychiatric/Behavioral:  Positive for confusion. The patient is nervous/anxious.     Physical Exam Constitutional:      General: She is not in acute distress.    Appearance: She is ill-appearing.  Cardiovascular:     Rate and Rhythm: Normal rate and regular rhythm.  Pulmonary:     Effort: Pulmonary effort is  normal.     Comments: 4L [place] Skin:    General: Skin is warm and dry.  Neurological:     Mental Status: She is alert.     Comments: Mental status difficult to assess d/t hard of hearing - seems easily confused     Vital Signs: BP (!) 172/50   Pulse 78   Temp 98.8 F (37.1 C) (Oral)   Resp (!) 22   Ht _0  (1.499 m)   Wt 84.3 kg   SpO2 96%   BMI 37.54 kg/m  Pain Scale: 0-10 POSS *See Group Information*: 4-INTERVENTION REQUIRED,Unacceptable,Somnolent, mininal or no response to verbal and physical stimulation Pain Score: 0-No pain   SpO2: SpO2: 96 % O2 Device:SpO2: 96 % O2 Flow Rate: .O2 Flow Rate (L/min): 3 L/min  IO: Intake/output summary:  Intake/Output Summary (Last 24 hours) at 11/05/2021 1504 Last data filed at 11/05/2021 1006 Gross per 24 hour  Intake 240 ml  Output 1200 ml  Net -960 ml    LBM: Last BM Date : 11/04/21 Baseline Weight: Weight: 81.2 kg Most recent weight: Weight: 84.3 kg     Palliative Assessment/Data: PPS 20%     *Please note that this is a verbal dictation therefore any spelling or grammatical errors are due to the "[place] [employer]" system interpretation.   Juel Burrow, DNP, AGNP-C Palliative Medicine Team [phone] Pager: [phone]

## 2021-11-05 NOTE — Progress Notes (Signed)
Civil engineer, contracting [facility])  Hospital Liaison Note  Referral received for patient/family interest in home with hospice. [facility] liaison spoke with patient's daughter Clydie Braun to confirm interest. Interest confirmed.   Hospice eligibility pending.   Plan is to discharge 1-2 days via ambulance.   DME in the home: Walker, cane, oxygen (adapt)   DME needs: none  Please send patient home with comfort medications/prescriptions at discharge.   Please call with any questions or concerns. Thank you  Dionicio Stall, [place] [facility] Liaison [phone]

## 2021-11-05 NOTE — Progress Notes (Signed)
HD#7 Subjective:  Summary: Virginia Summers is a 81 year old female with PMH of PE 2012 on Xarelto, asthma and possible COPD, HFpEF, hypothyroidism, T2DM, HTN, 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  Overnight Events: none  Virginia Summers reports no shortness of breath, on 3L O2 which is her home baseline. Still has sore on her tongue which chloraseptic spray helps. We had palliative care meeting with patient and family. Would like to proceed with home hospice care.   Objective:  Vital signs in last 24 hours: Vitals:   11/04/21 2249 11/05/21 0300 11/05/21 0609 11/05/21 0745  BP:   (!) 172/50   Pulse:   81 78  Resp: 16  20 (!) 22  Temp:   98.8 F (37.1 C)   TempSrc:   Oral   SpO2: 91%  95% 96%  Weight:  84.3 kg    Height:       Supplemental O2: 3L [place]   Physical Exam:  Constitutional: chronically ill appearing, in no acute distress HENT: normocephalic atraumatic. Nasal cannula in place.  Cardiovascular: regular rate and rhythm Pulmonary/Chest: normal work of breathing on 3L via [place], basilar crackles bilaterally Neurological: alert & oriented x 3 Skin: warm and dry Psych: normal mood and behavior  Filed Weights   11/02/21 0035 11/04/21 0020 11/05/21 0300  Weight: 80.7 kg 81.1 kg 84.3 kg     Intake/Output Summary (Last 24 hours) at 11/05/2021 0753 Last data filed at 11/05/2021 0300 Gross per 24 hour  Intake 250 ml  Output 1000 ml  Net -750 ml   Net IO Since Admission: 704.85 mL [11/05/21 0753]  Pertinent Labs:    Latest Ref Rng & Units 11/05/2021    4:50 AM 11/04/2021    4:55 AM 11/03/2021    5:24 AM  CBC  WBC 4.0 - 10.5 K/uL 4.7  7.2  6.7   Hemoglobin 12.0 - 15.0 g/dL 7.1  7.3  7.0   Hematocrit 36.0 - 46.0 % 24.2  24.3  23.1   Platelets 150 - 400 K/uL 206  212  199        Latest Ref  Rng & Units 11/05/2021    4:50 AM 11/04/2021    4:55 AM 11/03/2021    5:24 AM  CMP  Glucose 70 - 99 mg/dL 254  270  623   BUN 8 - 23 mg/dL 25  38  43   Creatinine 0.44 - 1.00 mg/dL 7.62  8.31  5.17   Sodium 135 - 145 mmol/L 133  130  125   Potassium 3.5 - 5.1 mmol/L 3.5  4.1  4.6   Chloride 98 - 111 mmol/L 97  93  92   CO2 22 - 32 mmol/L 31  27  23    Calcium 8.9 - 10.3 mg/dL 8.2  8.4  8.2     Imaging: No results found.  Assessment/Plan:   Principal Problem:   Acute respiratory failure with hypoxia (HCC) Active Problems:   HTN (hypertension)   Hypothyroidism   COPD (chronic obstructive pulmonary disease) (HCC)   History of pulmonary embolus (PE)   Diabetes mellitus type 2 in obese (HCC)   Heart block AV complete (HCC)   Community acquired pneumonia of right lower lobe of lung   Hypokalemia   Hypomagnesemia   Iron deficiency anemia   Acute on  chronic diastolic heart failure [facility])  Patient Summary: Virginia Summers is a 81 year old female with PMH of PE 2012 on Xarelto, asthma and possible COPD, HFpEF, hypothyroidism, T2DM, HTN, 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  Community acquired pneumococcal pneumonia Acute on chronic hypoxic respiratory failure 2/2 COPD & Asthma Patient is on 3L O2 with SpO2 >95%. Recent worsening respiratory status likely due to IV fluids given for AKI. She is on IV lasix with good urine output. She remains afebrile with normal WBC. She has completed 5 days of ceftriaxone.  -IV lasix 40 mg daily -continue duonebs and breo ellipta  -continue humidified O2 supplementation   Iron Deficiency Anemia History of PE (2012) on xarelto GERD Hgb remains stable 7.1. Given IV iron. Family reports poor po intake prior to admission. Iron panel consistent with IDA. Hx of GERD and  was told she needed an endoscopy in the past however this was never done. Low suspicion for acute brisk GI bleed. Once hgb stabilizes, will consider restarting xarelto, will need outpatient follow up. FOBT negative.  -protonix 40 mg daily (continue after discharge) -trend CBC daily, consider transfuse Hgb under 7  Hyponatremia Anion Gap Metabolic Acidosis Sodium improved 133. Bicarb at 31. Likely in setting of poor po intake. Not able to tolerate CPAP still for possible OSA vs OHS.  -BMP  Anterior neck swelling Patient still having tenderness along left jawline. CT neck w/o contrast did not show any acute infection or abscess in the neck. Likely lymphadenopathy from recent infection. Sore throat likely from the coughing. She is using the chloraseptic spray.   CKD 3B AKI improving today with diuresis. On IV lasix 40 mg. Continue to monitor.  -BMP  T2DM  Glucose last night 77 but back up this morning. On levemir 20 u, novolog 5 u and SSI.  -continue CBGs -continue levemir 20 u, novolog 5 u, SSI  HTN BP continues to be elevated with home meds. She is now on IV lasix 40 mg daily.  -amlodipine 10 mg  -hydralazine 75 mg TID.   Goals of Care Discussion with palliative care and family to discuss goals of care. They would like to proceed with home hospice care. Appreciate palliative care assistance.   Diet: Heart Healthy IVF: None,None VTE: SCDs Code: Full PT/OT recs: Home Health PT  Dispo: Anticipated discharge to Home in 1-2 days pending home hospice referral.   Virginia Snare, DO Internal Medicine Resident PGY-1 Please contact the on call pager after 5 pm and on weekends at [phone].

## 2021-11-06 DIAGNOSIS — Z515 Encounter for palliative care: Secondary | ICD-10-CM | POA: Diagnosis not present

## 2021-11-06 DIAGNOSIS — J9601 Acute respiratory failure with hypoxia: Secondary | ICD-10-CM | POA: Diagnosis not present

## 2021-11-06 DIAGNOSIS — I5033 Acute on chronic diastolic (congestive) heart failure: Secondary | ICD-10-CM | POA: Diagnosis not present

## 2021-11-06 DIAGNOSIS — Z7189 Other specified counseling: Secondary | ICD-10-CM | POA: Diagnosis not present

## 2021-11-06 LAB — GLUCOSE, CAPILLARY
Glucose-Capillary: 68 mg/dL — ABNORMAL LOW (ref 70–99)
Glucose-Capillary: 112 mg/dL — ABNORMAL HIGH (ref 70–99)
Glucose-Capillary: 135 mg/dL — ABNORMAL HIGH (ref 70–99)
Glucose-Capillary: 123 mg/dL — ABNORMAL HIGH (ref 70–99)
Glucose-Capillary: 142 mg/dL — ABNORMAL HIGH (ref 70–99)

## 2021-11-06 LAB — BASIC METABOLIC PANEL
Anion gap: 8 (ref 5–15)
BUN: 17 mg/dL (ref 8–23)
CO2: 32 mmol/L (ref 22–32)
Calcium: 8.4 mg/dL — ABNORMAL LOW (ref 8.9–10.3)
Chloride: 94 mmol/L — ABNORMAL LOW (ref 98–111)
Creatinine, Ser: 0.87 mg/dL (ref 0.44–1.00)
GFR, Estimated: >60 mL/min (ref >60)
Glucose, Bld: 71 mg/dL (ref 70–99)
Potassium: 3.3 mmol/L — ABNORMAL LOW (ref 3.5–5.1)
Sodium: 134 mmol/L — ABNORMAL LOW (ref 135–145)

## 2021-11-06 LAB — CBC
HCT: 25.9 % — ABNORMAL LOW (ref 36.0–46.0)
Hemoglobin: 7.6 g/dL — ABNORMAL LOW (ref 12.0–15.0)
MCH: 21.8 pg — ABNORMAL LOW (ref 26.0–34.0)
MCHC: 29.3 g/dL — ABNORMAL LOW (ref 30.0–36.0)
MCV: 74.2 fL — ABNORMAL LOW (ref 80.0–100.0)
Platelets: 224 10*3/uL (ref 150–400)
RBC: 3.49 MIL/uL — ABNORMAL LOW (ref 3.87–5.11)
RDW: 18.6 % — ABNORMAL HIGH (ref 11.5–15.5)
WBC: 3.7 10*3/uL — ABNORMAL LOW (ref 4.0–10.5)
nRBC: 0.5 % — ABNORMAL HIGH (ref 0.0–0.2)

## 2021-11-06 LAB — MAGNESIUM: Magnesium: 1.7 mg/dL (ref 1.7–2.4)

## 2021-11-06 MED ORDER — RAMELTEON 8 MG PO TABS
8.0000 mg | ORAL_TABLET | Freq: Every day | ORAL | Status: DC
  Administered 2021-11-07: 8 mg via ORAL
  Filled 2021-11-06 (×2): qty 1

## 2021-11-06 MED ORDER — MAGNESIUM SULFATE 2 GM/50ML IV SOLN
2.0000 g | Freq: Once | INTRAVENOUS | Status: AC
  Administered 2021-11-06: 2 g via INTRAVENOUS
  Filled 2021-11-06: qty 50

## 2021-11-06 MED ORDER — INSULIN DETEMIR 100 UNIT/ML ~~LOC~~ SOLN
10.0000 [IU] | Freq: Every day | SUBCUTANEOUS | Status: DC
  Administered 2021-11-06 – 2021-11-07 (×2): 10 [IU] via SUBCUTANEOUS
  Filled 2021-11-06 (×3): qty 0.1

## 2021-11-06 MED ORDER — POTASSIUM CHLORIDE CRYS ER 20 MEQ PO TBCR
40.0000 meq | EXTENDED_RELEASE_TABLET | Freq: Two times a day (BID) | ORAL | Status: AC
Start: 2021-11-06 — End: 2021-11-06
  Administered 2021-11-06 (×2): 40 meq via ORAL
  Filled 2021-11-06 (×2): qty 2

## 2021-11-06 MED ORDER — TRAZODONE HCL 50 MG PO TABS
50.0000 mg | ORAL_TABLET | Freq: Once | ORAL | Status: AC
  Administered 2021-11-06: 50 mg via ORAL
  Filled 2021-11-06: qty 1

## 2021-11-06 NOTE — Progress Notes (Signed)
Daily Progress Note   Patient Name: Virginia Summers       Date: 11/06/2021 DOB: April 29, 1940  Age: 81 y.o. MRN#: [id number] Attending Physician: Miguel Aschoff, MD Primary Care Physician: Eather Colas, FNP Admit Date: 10/29/2021  Reason for Consultation/Follow-up: Establishing goals of care  Subjective: "I feel pretty good" no complaints  Length of Stay: 8  Current Medications: Scheduled Meds:   amLODipine  10 mg Oral Daily   DULoxetine  60 mg Oral QHS   fluticasone furoate-vilanterol  1 puff Inhalation Daily   furosemide  40 mg Intravenous Daily   hydrALAZINE  75 mg Oral TID   insulin aspart  0-15 Units Subcutaneous TID WC   insulin aspart  5 Units Subcutaneous TID WC   insulin detemir  10 Units Subcutaneous QHS   ipratropium-albuterol  3 mL Nebulization Once   levothyroxine  25 mcg Oral QAC breakfast   pantoprazole  40 mg Oral Daily   Or   pantoprazole  40 mg Oral Daily   ramelteon  8 mg Oral QHS    Continuous Infusions:   PRN Meds: acetaminophen, dextrose, ipratropium-albuterol, lidocaine, menthol-cetylpyridinium, ondansetron (ZOFRAN) IV, phenol, polyethylene glycol  Physical Exam Constitutional:      General: She is not in acute distress.    Appearance: She is ill-appearing.  Pulmonary:     Effort: Pulmonary effort is normal.  Skin:    General: Skin is warm and dry.  Neurological:     Mental Status: She is alert.             Vital Signs: BP (!) 173/51 (BP Location: Right Arm)   Pulse 80   Temp 98 F (36.7 C) (Oral)   Resp 18   Ht 4\' 11"  (1.499 m)   Wt 84.3 kg   SpO2 98%   BMI 37.54 kg/m  SpO2: SpO2: 98 % O2 Device: O2 Device: Nasal Cannula O2 Flow Rate: O2 Flow Rate (L/min): 3 L/min  Intake/output summary:  Intake/Output Summary (Last 24 hours) at 11/06/2021  0948 Last data filed at 11/06/2021 0636 Gross per 24 hour  Intake 240 ml  Output 2350 ml  Net -2110 ml   LBM: Last BM Date : 11/04/21 Baseline Weight: Weight: 81.2 kg Most recent weight: Weight: 84.3 kg       Palliative Assessment/Data: PPS 20%      Patient Active Problem List   Diagnosis Date Noted   Acute on chronic diastolic heart failure (HCC)    Iron deficiency anemia    Community acquired pneumonia of right lower lobe of lung    Hypokalemia    Hypomagnesemia    Coagulase negative Staphylococcus bacteremia    Heart block AV complete (HCC)    Bradycardia 08/27/2021   Acute respiratory failure (HCC) 08/27/2021   Renal failure    Hyperkalemia    Respiratory failure (HCC) 11/19/2020   Acute respiratory failure with hypoxemia (HCC) 11/17/2020   Hypertensive urgency 11/17/2020   Diabetes mellitus type 2 in obese (HCC) 11/17/2020   Acute respiratory failure with hypoxia (HCC) 11/17/2020   COVID-19 virus infection 11/17/2020   Precordial chest pain    Palpitations 04/17/2020   Morbid obesity (HCC) 06/04/2019   COPD (  chronic obstructive pulmonary disease) (HCC) 05/10/2019   History of pulmonary embolus (PE) 05/10/2019   Hiatal hernia 05/12/2011   Hypothyroidism 08/10/2010   Cramp 06/09/2010   Pulmonary HTN (HCC) 06/09/2010   Pulmonary embolism (HCC)    HTN (hypertension)    Anticoagulant long-term use     Palliative Care Assessment & Plan   HPI: 81 y.o. female  with past medical history of PE 2012 on Xarelto, asthma and possible COPD, HFpEF, hypothyroidism, T2DM, HTN, complete heart block s/p pacemaker 08/2021, CKD 3B, and chronic hypoxia on 2-3L at home admitted on 10/29/2021 with acute episode of shortness of breath. Diagnosed with community-acquired pneumococcal pneumonia.  Patient also with iron deficiency anemia and required 1 unit of PRBCs.  Patient with increased lethargy throughout hospitalization and poor p.o. intake.  PMT consulted to discuss goals of  care.  Assessment: Follow-up today with patient. No family at bedside. Patient tells me she feels well today, tells me she remembers me from yesterday and remembers our conversation.  Tells me she has no questions or concerns regarding her conversation.  Patient appears more engaged and alert today than yesterday. Called to daughter Virginia Braun to follow-up on our conversation yesterday -she also denies questions or concerns.  She tells me she feels like a load has been taken off of her and now that we have a clear plan in place for patient -to focus on her comfort and quality of life and keeping her at home, out of the hospital.  She tells me she spoke with the hospice liaison yesterday and feels good about moving forward with hospice care at home.  Recommendations/Plan: Proceed with hospice care at home, family has no questions or concerns Please reach out to the palliative team for any further needs Continue CODE STATUS conversations outpatient as patient's mental status clears  Code Status: Full code  Discharge Planning: Home with Hospice  Care plan was discussed with patient and daughter Virginia Braun  Thank you for allowing the Palliative Medicine Team to assist in the care of this patient.   *Please note that this is a verbal dictation therefore any spelling or grammatical errors are due to the "[employer]" system interpretation.  Gerlean Ren, DNP, [facility] Palliative Medicine Team Team Phone # [phone]  Pager [phone]

## 2021-11-06 NOTE — Progress Notes (Signed)
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  Overnight Events: CBG 68, Glucerna given, repeat CBG 112  Virginia Summers denies any new concerns. Reports feeling better this morning. She recalls our family discussion yesterday and does not have any questions. Still having pain from sores on tongue which is relieved by chloraseptic spray and viscous lidocaine.   Objective:  Vital signs in last 24 hours: Vitals:   11/05/21 0745 11/05/21 1625 11/05/21 1937 11/06/21 0027  BP:  (!) 161/64 (!) 192/52 (!) 185/58  Pulse: 78 83 81 85  Resp: (!) 22  (!) 22 (!) 22  Temp:  97.9 F (36.6 C) 97.9 F (36.6 C) 97.9 F (36.6 C)  TempSrc:  Oral Oral Oral  SpO2: 96%  97% 99%  Weight:      Height:       Supplemental O2: 3L [place]   Physical Exam:  Constitutional: chronically ill appearing, in no acute distress HENT: normocephalic atraumatic. Nasal cannula in place.  Cardiovascular: regular rate and rhythm Pulmonary/Chest: normal work of breathing on 3L via [place], less basilar crackles bilaterally today Neurological: alert & oriented x 3 Skin: warm and dry Psych: normal mood and behavior  Filed Weights   11/02/21 0035 11/04/21 0020 11/05/21 0300  Weight: 80.7 kg 81.1 kg 84.3 kg     Intake/Output Summary (Last 24 hours) at 11/06/2021 0619 Last data filed at 11/06/2021 0300 Gross per 24 hour  Intake 480 ml  Output 1950 ml  Net -1470 ml    Net IO Since Admission: -765.15 mL [11/06/21 0619]  Pertinent Labs:    Latest Ref Rng & Units 11/05/2021    4:50 AM 11/04/2021    4:55 AM 11/03/2021    5:24 AM  CBC  WBC 4.0 - 10.5 K/uL 4.7  7.2  6.7   Hemoglobin 12.0 - 15.0  g/dL 7.1  7.3  7.0   Hematocrit 36.0 - 46.0 % 24.2  24.3  23.1   Platelets 150 - 400 K/uL 206  212  199        Latest Ref Rng & Units 11/05/2021    4:50 AM 11/04/2021    4:55 AM 11/03/2021    5:24 AM  CMP  Glucose 70 - 99 mg/dL 182  993  716   BUN 8 - 23 mg/dL 25  38  43   Creatinine 0.44 - 1.00 mg/dL 9.67  8.93  8.10   Sodium 135 - 145 mmol/L 133  130  125   Potassium 3.5 - 5.1 mmol/L 3.5  4.1  4.6   Chloride 98 - 111 mmol/L 97  93  92   CO2 22 - 32 mmol/L 31  27  23    Calcium 8.9 - 10.3 mg/dL 8.2  8.4  8.2     Imaging: No results found.  Assessment/Plan:   Principal Problem:   Acute respiratory failure with hypoxia (HCC) Active Problems:   HTN (hypertension)   Hypothyroidism   COPD (chronic obstructive pulmonary disease) (HCC)   History of pulmonary embolus (PE)   Diabetes mellitus type 2 in obese (HCC)   Heart block AV complete (HCC)   Community acquired pneumonia of right lower lobe of lung  Hypokalemia   Hypomagnesemia   Iron deficiency anemia   Acute on chronic diastolic heart failure [facility])  Patient Summary: Virginia Summers is a 81 year old female with PMH of PE 2012 on Xarelto, asthma and possible COPD, HFpEF, hypothyroidism, T2DM, HTN, 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  Community acquired pneumococcal pneumonia Acute on chronic hypoxic respiratory failure 2/2 COPD & Asthma Patient is on 3L O2 with SpO2 96-98%. She is on daily IV lasix with good urine output, 1950 ml yesterday. She remains afebrile with normal WBC. She has completed 5 days of ceftriaxone.  -IV lasix 40 mg daily -duonebs and breo ellipta  -humidified O2 supplementation   Iron Deficiency Anemia History of PE (2012) on xarelto GERD Hgb improved 7.6. Received ferric gluconate x 3 doses. Hx of GERD but did  not complete EGD. FOBT negative. Low suspicion for acute GI bleed. Consider restarting xarelto with outpatient follow up.  -Protonix 40 mg daily (will continue after discharge) -CBC, if Hgb <7 then consider transfusion  Hypokalemia Hypomagnesemia Hyponatremia Anion Gap Metabolic Acidosis, resolved Potassium dropped to 3.3 likely from lasix diuresis, will replete. Magnesium initially low but today low-normal. Sodium better today 134. Likely in setting of poor po intake. She is eating more the past two days with family encouragement. Not able to tolerate CPAP still for possible OSA vs OHS.  -potassium 40 mEq x 2 doses -magnesium sulfate 2 g once -repeat BMP and magnesium level  Tongue Sores Anterior neck swelling She has two ulcers on lateral sides of tongue. No surrounding erythema, swelling, active bleeding or drainage. Chloraseptic spray and viscous lidocaine helps. Could be from her pursed lip breathing since she does not use her CPAP. CT neck w/o contrast did not show any acute infection or abscess in the neck. Likely lymphadenopathy from recent infection.  -chloraseptic spray as needed -viscous lidocaine as needed  AKI on CKD 3B AKI improving with better po intake and IV lasix. Continue to monitor.  -BMP  T2DM  Glucose dropped to 68 this morning, given Glucerna with improvement. Her intake has improved slightly but will adjust long acting to prevent repeat hypoglycemia.  -continue CBG with meals and bedtime -decrease levemir to 10 units at bedtime  -novolog 5 units with meals -SSI ACHS   HTN BP remains elevated with home meds. We are continuing IV lasix. Will continue to monitor.  -amlodipine 10 mg daily -hydralazine 75 mg TID -continue lasix daily  Goals of Care No further questions today. Family meeting on 8/25 for goals of care. Pending home hospice eligibility. Patient remains full code but family will continue with code status discussions. Appreciate palliative care  assistance.    Diet: Heart Healthy IVF: None,None VTE: SCDs Code: Full PT/OT recs: Home Health PT  Dispo: Anticipated discharge to Home in 1-2 days pending home hospice status.   Rana Snare, DO Internal Medicine Resident PGY-1 Please contact the on call pager after 5 pm and on weekends at [phone].

## 2021-11-07 LAB — CBC
HCT: 26.3 % — ABNORMAL LOW (ref 36.0–46.0)
Hemoglobin: 7.8 g/dL — ABNORMAL LOW (ref 12.0–15.0)
MCH: 22.5 pg — ABNORMAL LOW (ref 26.0–34.0)
MCHC: 29.7 g/dL — ABNORMAL LOW (ref 30.0–36.0)
MCV: 75.8 fL — ABNORMAL LOW (ref 80.0–100.0)
Platelets: 217 10*3/uL (ref 150–400)
RBC: 3.47 MIL/uL — ABNORMAL LOW (ref 3.87–5.11)
RDW: 19.3 % — ABNORMAL HIGH (ref 11.5–15.5)
WBC: 5.3 10*3/uL (ref 4.0–10.5)
nRBC: 0 % (ref 0.0–0.2)

## 2021-11-07 LAB — BASIC METABOLIC PANEL
Anion gap: 7 (ref 5–15)
BUN: 16 mg/dL (ref 8–23)
CO2: 34 mmol/L — ABNORMAL HIGH (ref 22–32)
Calcium: 8.4 mg/dL — ABNORMAL LOW (ref 8.9–10.3)
Chloride: 93 mmol/L — ABNORMAL LOW (ref 98–111)
Creatinine, Ser: 0.96 mg/dL (ref 0.44–1.00)
GFR, Estimated: 59 mL/min — ABNORMAL LOW (ref >60)
Glucose, Bld: 137 mg/dL — ABNORMAL HIGH (ref 70–99)
Potassium: 4.5 mmol/L (ref 3.5–5.1)
Sodium: 134 mmol/L — ABNORMAL LOW (ref 135–145)

## 2021-11-07 LAB — GLUCOSE, CAPILLARY
Glucose-Capillary: 129 mg/dL — ABNORMAL HIGH (ref 70–99)
Glucose-Capillary: 114 mg/dL — ABNORMAL HIGH (ref 70–99)
Glucose-Capillary: 201 mg/dL — ABNORMAL HIGH (ref 70–99)
Glucose-Capillary: 197 mg/dL — ABNORMAL HIGH (ref 70–99)
Glucose-Capillary: 161 mg/dL — ABNORMAL HIGH (ref 70–99)

## 2021-11-07 LAB — MAGNESIUM: Magnesium: 1.9 mg/dL (ref 1.7–2.4)

## 2021-11-07 MED ORDER — GELCLAIR MT GEL: 1.0000 | OROMUCOSAL | Status: DC | PRN

## 2021-11-07 MED ORDER — POLYETHYLENE GLYCOL 3350 17 G PO PACK
17.0000 g | PACK | Freq: Every day | ORAL | Status: DC
  Administered 2021-11-07 – 2021-11-08 (×2): 17 g via ORAL
  Filled 2021-11-07 (×2): qty 1

## 2021-11-07 NOTE — Progress Notes (Signed)
Pt refuses cpap/bipap at this time,  RT will cont to monitor.

## 2021-11-07 NOTE — Progress Notes (Cosign Needed Addendum)
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  Overnight Events: none  Virginia Summers reports feeling well and more alert. She ate her breakfast of grits and pancakes. Still having pain with tongue sores but able to eat slowly.   Objective:  Vital signs in last 24 hours: Vitals:   11/06/21 1715 11/06/21 2000 11/07/21 0013 11/07/21 0603  BP: (!) 154/53 (!) 141/36 (!) 161/39 (!) 156/40  Pulse: 65 62 60 60  Resp: (!) 22 20 20 20   Temp:  98.4 F (36.9 C) 98.4 F (36.9 C) 97.7 F (36.5 C)  TempSrc:  Oral Oral Oral  SpO2:  98% 91%   Weight:   83.4 kg   Height:       Supplemental O2: 3L [place]  Physical Exam:  Constitutional: chronically ill appearing, in no acute distress HENT: normocephalic atraumatic. Nasal cannula in place. Ulceration on lateral tongue bilaterally with no surrounding erythema, bleeding or drainage Cardiovascular: regular rate and rhythm Pulmonary/Chest: normal work of breathing on 3L [place], basilar crackles left >right but improved Abdominal: soft, mild distension, active bowel sounds Neurological: alert & oriented x 3 Skin: decreased skin turgor, symmetric stage 1 sacral wounds, LE warm with no pressure injury to heels bilaterally Psych: normal mood and behavior  Filed Weights   11/04/21 0020 11/05/21 0300 11/07/21 0013  Weight: 81.1 kg 84.3 kg 83.4 kg     Intake/Output Summary (Last 24 hours) at 11/07/2021 0611 Last data filed at 11/07/2021 11/09/2021 Gross per 24 hour  Intake 200 ml  Output 1350 ml  Net -1150 ml    Net IO Since Admission: -1,915.15 mL [11/07/21 0611]  Pertinent Labs:    Latest Ref Rng & Units 11/07/2021    3:25  AM 11/06/2021    5:38 AM 11/05/2021    4:50 AM  CBC  WBC 4.0 - 10.5 K/uL 5.3  3.7  4.7   Hemoglobin 12.0 - 15.0 g/dL 7.8  7.6  7.1   Hematocrit 36.0 - 46.0 % 26.3  25.9  24.2   Platelets 150 - 400 K/uL 217  224  206        Latest Ref Rng & Units 11/07/2021    3:25 AM 11/06/2021    5:38 AM 11/05/2021    4:50 AM  CMP  Glucose 70 - 99 mg/dL 11/07/2021  71  960   BUN 8 - 23 mg/dL 16  17  25    Creatinine 0.44 - 1.00 mg/dL 454   0.98   Sodium 135 - 145 mmol/L 134  134  133   Potassium 3.5 - 5.1 mmol/L 4.5  3.3  3.5   Chloride 98 - 111 mmol/L 93  94  97   CO2 22 - 32 mmol/L 34  32  31   Calcium 8.9 - 10.3 mg/dL 8.4  8.4  8.2     Imaging: No results found.  Assessment/Plan:   Principal Problem:   Acute respiratory failure with hypoxia (HCC) Active Problems:   HTN (hypertension)   Hypothyroidism   COPD (chronic obstructive pulmonary disease) (HCC)   History of pulmonary embolus (PE)   Diabetes mellitus type 2 in obese (HCC)   Heart block AV  complete [facility])   Community acquired pneumonia of right lower lobe of lung   Hypokalemia   Hypomagnesemia   Iron deficiency anemia   Acute on chronic diastolic heart failure [facility])  Patient Summary: Virginia Summers is a 81 year old female with PMH of PE 2012 on Xarelto, asthma and possible COPD, HFpEF, hypothyroidism, T2DM, HTN, 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  Acute on chronic hypoxic respiratory failure 2/2 community acquired pneumococcal pneumonia, possible COPD/asthma Patient is on 3L O2 with SpO2  94-97%. Respiratory status improved. On daily IV lasix with adequate urine output. Remains afebrile with normal WBC. She has completed 5 days of ceftriaxone.  -IV lasix 40 mg daily -duonebs and breo ellipta  -humidified O2 supplementation   Iron  Deficiency Anemia History of PE (2012) on xarelto GERD Hgb continues to improve 7.8. Received ferric gluconate x 3 doses. Hx of GERD but did not complete EGD. FOBT negative. Low suspicion for acute GI bleed. May consider restarting xarelto with outpatient follow up. -Protonix 40 mg daily (will continue after discharge) -CBC, if Hgb <7 then consider transfusion  Hyponatremia Hypokalemia, resolved Hypomagnesemia, resolved Anion Gap Metabolic Acidosis, resolved Potassium improved to 4.5 from 3.3 likely from diuresis. Magnesium improved 1.9 from 1.6 after 2 g mag sulfate. Sodium steady 134. Family notes improved appetite the past few days with encouragement. Before she was barely eating. Still not tolerating CPAP for possible OSA vs OHS.   Tongue ulcerations She has two ulcers on lateral sides of tongue. No surrounding erythema, swelling, active bleeding or drainage. Chloraseptic spray and viscous lidocaine has been helping. Could be from her pursed lip breathing since she does not use her CPAP.  -chloraseptic spray PRN -viscous lidocaine PRN -gelclair PRN   AKI on CKD 3B Improving with better po intake. Continue to monitor.   T2DM CBG overnight 130-140s. No hypoglycemia noted with decreased levemir dose.  -CBG with meals and bedtime -levemir 10 units at bedtime  -novolog 5 units ACHS -SSI ACHS   HTN BP better compared to previous days. She is on her home meds along with lasix daily. Will continue to monitor.  -amlodipine 10 mg daily -hydralazine 75 mg TID -continue lasix daily  Symmetric stage 1 pressure ulcer, apex of gluteal cleft Exam showed sacral ulcers. She sits in her chair at times during the day. Encouraged her to ambulate with PT as tolerated. -wound care   Goals of Care Family meeting on 8/25 for goals of care. Pending home hospice status for Monday. At discharge, will need comfort medications/prescriptions. Patient remains full code but family will continue with code  status discussions. Appreciate palliative care assistance.   Diet: Heart Healthy IVF: None,None VTE: SCDs Code: Full PT/OT recs: Home Health PT  Dispo: Anticipated discharge to Home in 1 days pending home hospice status.   Rana Snare, DO Internal Medicine Resident PGY-1 Please contact the on call pager after 5 pm and on weekends at [phone].

## 2021-11-07 NOTE — Progress Notes (Signed)
[facility] ([facility] Hospital Liaison Note  [facility] continues to follow patient. We will admit once she has discharged home. Please call with any questions or concerns. Thank you  Shanita Wicker, LCSW [facility] Hospital Liaison [phone] 

## 2021-11-08 ENCOUNTER — Telehealth: Payer: Self-pay | Admitting: Cardiovascular Disease

## 2021-11-08 DIAGNOSIS — Z7984 Long term (current) use of oral hypoglycemic drugs: Secondary | ICD-10-CM

## 2021-11-08 DIAGNOSIS — J9601 Acute respiratory failure with hypoxia: Secondary | ICD-10-CM | POA: Diagnosis not present

## 2021-11-08 DIAGNOSIS — J13 Pneumonia due to Streptococcus pneumoniae: Secondary | ICD-10-CM | POA: Diagnosis not present

## 2021-11-08 DIAGNOSIS — D509 Iron deficiency anemia, unspecified: Secondary | ICD-10-CM | POA: Diagnosis not present

## 2021-11-08 DIAGNOSIS — N179 Acute kidney failure, unspecified: Secondary | ICD-10-CM

## 2021-11-08 DIAGNOSIS — J449 Chronic obstructive pulmonary disease, unspecified: Secondary | ICD-10-CM | POA: Diagnosis not present

## 2021-11-08 LAB — BASIC METABOLIC PANEL
Anion gap: 6 (ref 5–15)
BUN: 19 mg/dL (ref 8–23)
CO2: 36 mmol/L — ABNORMAL HIGH (ref 22–32)
Calcium: 8.3 mg/dL — ABNORMAL LOW (ref 8.9–10.3)
Chloride: 92 mmol/L — ABNORMAL LOW (ref 98–111)
Creatinine, Ser: 1 mg/dL (ref 0.44–1.00)
GFR, Estimated: 57 mL/min — ABNORMAL LOW (ref >60)
Glucose, Bld: 136 mg/dL — ABNORMAL HIGH (ref 70–99)
Potassium: 4.5 mmol/L (ref 3.5–5.1)
Sodium: 134 mmol/L — ABNORMAL LOW (ref 135–145)

## 2021-11-08 LAB — CBC
HCT: 26.1 % — ABNORMAL LOW (ref 36.0–46.0)
Hemoglobin: 7.7 g/dL — ABNORMAL LOW (ref 12.0–15.0)
MCH: 22.6 pg — ABNORMAL LOW (ref 26.0–34.0)
MCHC: 29.5 g/dL — ABNORMAL LOW (ref 30.0–36.0)
MCV: 76.5 fL — ABNORMAL LOW (ref 80.0–100.0)
Platelets: 200 10*3/uL (ref 150–400)
RBC: 3.41 MIL/uL — ABNORMAL LOW (ref 3.87–5.11)
RDW: 20 % — ABNORMAL HIGH (ref 11.5–15.5)
WBC: 5.9 10*3/uL (ref 4.0–10.5)
nRBC: 0 % (ref 0.0–0.2)

## 2021-11-08 LAB — MAGNESIUM: Magnesium: 1.9 mg/dL (ref 1.7–2.4)

## 2021-11-08 LAB — GLUCOSE, CAPILLARY
Glucose-Capillary: 137 mg/dL — ABNORMAL HIGH (ref 70–99)
Glucose-Capillary: 175 mg/dL — ABNORMAL HIGH (ref 70–99)

## 2021-11-08 NOTE — Discharge Summary (Signed)
Name: Virginia Summers MRN: [id number] DOB: 1940/09/12 81 y.o. PCP: Virginia Chalk, FNP  Date of Admission: 10/29/2021  8:03 AM Date of Discharge: 11/08/21 Attending Physician: Dr. Daryll Drown  Discharge Diagnosis: Principal Problem:   Acute respiratory failure with hypoxia ([place] Summers) Active Problems:   HTN (hypertension)   Hypothyroidism   COPD (chronic obstructive pulmonary disease) (HCC)   History of pulmonary embolus (PE)   Diabetes mellitus type 2 in obese ([place])   Heart block AV complete ([place] Summers)   Community acquired pneumonia of right lower lobe of lung   Hypokalemia   Hypomagnesemia   Iron deficiency anemia   Acute on chronic diastolic heart failure ([place] Summers)    Discharge Medications: Allergies as of 11/08/2021       Reactions   Symbicort [budesonide-formoterol Fumarate] Other (See Comments)   Shaking/ jittery feeling   Penicillins Swelling, Rash   Did it involve swelling of the face/tongue/throat, SOB, or low BP? No Did it involve sudden or severe rash/hives, skin peeling, or any reaction on the inside of your mouth or nose? No Did you need to seek medical attention at a hospital or doctor's office? No When did it last happen?    childhood   If all above answers are "NO", may proceed with cephalosporin use.   Sulfa Drugs Cross Reactors Swelling, Rash        Medication List     STOP taking these medications    oxyCODONE 5 MG immediate release tablet Commonly known as: Oxy IR/ROXICODONE       TAKE these medications    acetaminophen 500 MG tablet Commonly known as: TYLENOL Take 1,000 mg by mouth every 6 (six) hours as needed for moderate pain or headache.   albuterol 108 (90 Base) MCG/ACT inhaler Commonly known as: VENTOLIN HFA Inhale 2 puffs into the lungs every 4 (four) hours as needed for wheezing or shortness of breath. For shortness of breath.   amLODipine 10 MG tablet Commonly known as: NORVASC Take 1 tablet (10 mg total) by mouth daily.    budesonide-formoterol 80-4.5 MCG/ACT inhaler Commonly known as: SYMBICORT Inhale 2 puffs into the lungs 2 (two) times daily.   chlorthalidone 25 MG tablet Commonly known as: HYGROTON Take 1 tablet by mouth once daily   DRY EYES OP Apply 1 drop to eye 2 (two) times daily as needed (dry eyes).   DULoxetine 60 MG capsule Commonly known as: CYMBALTA Take 60 mg by mouth at bedtime.   fexofenadine 180 MG tablet Commonly known as: ALLEGRA Take 180 mg by mouth daily.   fluticasone 50 MCG/ACT nasal spray Commonly known as: FLONASE Place 2 sprays into both nostrils daily as needed for allergies or rhinitis.   hydrALAZINE 25 MG tablet Commonly known as: APRESOLINE TAKE 3 TABLETS BY MOUTH THREE TIMES DAILY APPT  REQD  FOR  REFILLS What changed: See the new instructions.   Lantus SoloStar 100 UNIT/ML Solostar Pen Generic drug: insulin glargine Inject 16 Units into the skin daily.   levothyroxine 25 MCG tablet Commonly known as: SYNTHROID TAKE 1 TABLET (25 MCG TOTAL) BY MOUTH DAILY. What changed: See the new instructions.   metFORMIN 500 MG 24 hr tablet Commonly known as: GLUCOPHAGE-XR Take 500 mg by mouth daily with breakfast.   OneTouch Ultra test strip Generic drug: glucose blood USE 1 STRIP TO CHECK GLUCOSE TWICE DAILY OR  AS  DIRECTED.   rivaroxaban 10 MG Tabs tablet Commonly known as: Xarelto Take 1 tablet (10 mg total) by mouth  daily. Start 6/24 PM Notes to patient: Start taking again on 11/09/21 per doctor.   traZODone 50 MG tablet Commonly known as: DESYREL Take 50 mg by mouth at bedtime as needed for sleep.               Discharge Care Instructions  (From admission, onward)           Start     Ordered   11/08/21 0000  Discharge wound care:       Comments: Keep wound clean dry and intact   11/08/21 1153            Disposition and follow-up:   Ms.Virginia Summers was discharged from [facility] in Stable condition.  At the  hospital follow up visit please address:  1.  Follow-up:  a. Acute hypoxic respiratory failure due to Pneumococcal pneumonia, possible COPD/ashtna, pulmonary edema:  2-3L [place] at baseline. Completed 5 days CTX. Treated with Lasix which was stopped at discharge.    b. Iron deficiency anemia: GIB unliekly. Continued on Xarelto at discharge but can consider stopping at follow up. On Protonix   c. AKI on CKD3b: improving with PO intake   d. [facility] - patient is being sent home with hospice. Hospice team to evaluate for home hospice needs  2.  Labs / imaging needed at time of follow-up: n/a  3.  Pending labs/ test needing follow-up: n/a  4.  Medication Changes  Started: n/a  Stopped: n/a  Changed:  n/a  Abx -  CTX completed End Date: n/a  Follow-up Appointments:  Follow-up Information     [facility] Follow up.   Why: home hospice Contact information: [place] [id number] [place] Hospital Course by problem list:   Acute on chronic hypoxic respiratory failure 2/2 community acquired pneumococcal pneumonia, possible COPD/asthma Patient noted to have likely pneumonia with possible COPD exacerbation and pulmonary edema on imaging. Urine antigen testing positive for strep pneumo. She completed a course of ceftriaxone with improvement in her respiratory status. She was weaned back to her baseline O2 requirement of 3L. She was also treated with duonebs and breo ellipta for COPD.   Goals of Care Patient was noted to have increased lethargy throughout her hospitalization with poor PO intake. The decision to consult palliative to discuss goals of care was made. Palliative discussed with family 8/25. Family expressed interest in home with hospice. Patient was approved for this. She remained full scope of care during her hospitalization with the expectation that she would be started on hospice once home.   Iron Deficiency Anemia History  of PE (2012) on xarelto Patient noted to have anemia. She received 3 doses of ferric gluconate. She was continued on Protonix and home Xarelto. Xarelto will be continued at discharge with consideration of stopping at follow up.   Hyponatremia, Hypokalemia, hypomagnesemia Electrolytes were monitored during admission. Improved as her PO intake improved as well.   AKI on CKD 3B T2DM HTN  Discharge Subjective: Patient seen and evaluated at bedside. No complaints. Breathing feels okay, back to baseline. No complaints.   Discharge Exam:   BP (!) 142/39 (BP Location: Right Wrist)   Pulse 60   Temp 99 F (37.2 C) (Oral)   Resp 18   Ht 4\' 11"  (1.499 m)   Wt 79 kg   SpO2 94%   BMI 35.18 kg/m  Constitutional: chronically ill appearing, in no acute distress HENT: normocephalic atraumatic. Nasal cannula in place. Ulceration on lateral tongue bilaterally with no surrounding erythema, bleeding or drainage Cardiovascular: regular rate and rhythm Pulmonary/Chest: normal work of breathing on 3L [place],  Abdominal: soft, no distension, active bowel sounds Neurological: alert & oriented x 3 Skin: decreased skin turgor, symmetric stage 1 sacral wounds, LE warm with no pressure injury to heels bilaterally Psych: normal mood and behavior  Pertinent Labs, Studies, and Procedures:     Latest Ref Rng & Units 11/08/2021    6:13 AM 11/07/2021    3:25 AM 11/06/2021    5:38 AM  CBC  WBC 4.0 - 10.5 K/uL 5.9  5.3  3.7   Hemoglobin 12.0 - 15.0 g/dL 7.7  7.8  7.6   Hematocrit 36.0 - 46.0 % 26.1  26.3  25.9   Platelets 150 - 400 K/uL 200  217  224        Latest Ref Rng & Units 11/08/2021    6:13 AM 11/07/2021    3:25 AM 11/06/2021    5:38 AM  CMP  Glucose 70 - 99 mg/dL 563  893  71   BUN 8 - 23 mg/dL 19  16  17    Creatinine 0.44 - 1.00 mg/dL  7.34  2.87   Sodium 135 - 145 mmol/L 134  134  134   Potassium 3.5 - 5.1 mmol/L 4.5  4.5  3.3   Chloride 98 - 111 mmol/L 92  93  94   CO2 22 - 32 mmol/L 36   34  32   Calcium 8.9 - 10.3 mg/dL 8.3  8.4  8.4     ECHOCARDIOGRAM LIMITED  Result Date: 10/29/2021    ECHOCARDIOGRAM LIMITED REPORT   Patient Name:   JACOLE CAPLEY Fitzpatrick Date of Exam: 10/29/2021 Medical Rec #:  10/31/2021    Height:       59.0 in Accession #:    [id number]   Weight:       179.0 lb Date of Birth:  May 28, 1940   BSA:          1.759 m Patient Age:    80 years     BP:           148/61 mmHg Patient Gender: F            HR:           69 bpm. Exam Location:  Inpatient Procedure: Limited Echo, Limited Color Doppler and Cardiac Doppler Indications:    Respiratory distress  History:        Patient has prior history of Echocardiogram examinations.                 Pacemaker, COPD; Risk Factors:Diabetes.  Sonographer:    01/08/1941 RDCS Referring Phys: [id number] ERIK C HOFFMAN IMPRESSIONS  1. Left ventricular ejection fraction, by estimation, is 60 to 65%. The left ventricle has normal function. The left ventricle has no regional wall motion abnormalities.  2. Right ventricular systolic function is normal. The right ventricular size is normal. There is moderately elevated pulmonary artery systolic pressure.  3. Trivial mitral valve regurgitation.  4. Tricuspid valve regurgitation is mild to moderate.  5. Aortic valve regurgitation is not visualized.  6. The inferior vena cava is normal in size with greater than 50% respiratory variability, suggesting right atrial pressure of 3 mmHg. Conclusion(s)/Recommendation(s): EF appears improved compared to prior study. FINDINGS  Left Ventricle: Left ventricular ejection  fraction, by estimation, is 60 to 65%. The left ventricle has normal function. The left ventricle has no regional wall motion abnormalities. Abnormal (paradoxical) septal motion, consistent with RV pacemaker. Right Ventricle: The right ventricular size is normal. Right ventricular systolic function is normal. There is moderately elevated pulmonary artery systolic pressure. The tricuspid regurgitant  velocity is 3.42 m/s, and with an assumed right atrial pressure of 3 mmHg, the estimated right ventricular systolic pressure is 49.8 mmHg. Pericardium: There is no evidence of pericardial effusion. Mitral Valve: Trivial mitral valve regurgitation. Tricuspid Valve: Tricuspid valve regurgitation is mild to moderate. Aortic Valve: Aortic valve regurgitation is not visualized. Venous: The inferior vena cava is normal in size with greater than 50% respiratory variability, suggesting right atrial pressure of 3 mmHg. LEFT VENTRICLE PLAX 2D LVIDd:         4.80 cm Diastology LVIDs:         3.10 cm LV e' medial:    6.42 cm/s LV PW:         1.30 cm LV E/e' medial:  22.3 LV IVS:        1.20 cm LV e' lateral:   7.72 cm/s                        LV E/e' lateral: 18.5  IVC IVC diam: 2.20 cm LEFT ATRIUM         Index LA diam:    4.00 cm 2.27 cm/m  AORTIC VALVE LVOT Vmax:   164.00 cm/s LVOT Vmean:  116.000 cm/s LVOT VTI:    0.345 m  AORTA Ao Asc diam: 2.90 cm MITRAL VALVE                TRICUSPID VALVE MV Area (PHT): 3.42 cm     TR Peak grad:   46.8 mmHg MV Decel Time: 222 msec     TR Vmax:        342.00 cm/s MV E velocity: 143.00 cm/s MV A velocity: 111.00 cm/s  SHUNTS MV E/A ratio:  1.29         Systemic VTI: 0.34 m Carolan Clines Electronically signed by Carolan Clines Signature Date/Time: 10/29/2021/4:44:18 PM    Final    CT Angio Chest PE W/Cm &/Or Wo Cm  Result Date: 10/29/2021 CLINICAL DATA:  Shortness of breath beginning yesterday. Patient is oxygen dependent. Possible pulmonary embolism. EXAM: CT ANGIOGRAPHY CHEST WITH CONTRAST TECHNIQUE: Multidetector CT imaging of the chest was performed using the standard protocol during bolus administration of intravenous contrast. Multiplanar CT image reconstructions and MIPs were obtained to evaluate the vascular anatomy. RADIATION DOSE REDUCTION: This exam was performed according to the departmental dose-optimization program which includes automated exposure control, adjustment of the  mA and/or kV according to patient size and/or use of iterative reconstruction technique. CONTRAST:  107mL OMNIPAQUE IOHEXOL 350 MG/ML SOLN COMPARISON:  11/18/2020 FINDINGS: Cardiovascular: Stable cardiomegaly. Cardiac pacer over the left upper chest wall with pacer leads adequately position. Subtle calcified plaque over the left anterior descending and right coronary arteries. Thoracic aorta is normal in caliber. There is calcified plaque over the descending thoracic aorta. Pulmonary arterial system is adequately opacified and demonstrates no definite pulmonary emboli. Remaining vascular structures are unremarkable. Mediastinum/Nodes: 1.1 cm subcarinal lymph node. 1 cm right paratracheal lymph node. Findings likely reactive. No left hilar adenopathy. Remaining mediastinal structures are unremarkable. Lungs/Pleura: Lungs are adequately inflated demonstrate a patchy airspace process over the right lung most notable over the right  lower lobe. Also less patchy opacification over the left upper lobe and left base. Associated bibasilar atelectasis and small bilateral pleural effusions. Findings likely due to multifocal pneumonia. Airways are normal. Upper Abdomen: Calcified plaque over the abdominal aorta. No acute findings. Musculoskeletal: Degenerative changes of the spine. No focal abnormality. Review of the MIP images confirms the above findings. IMPRESSION: 1. No evidence of pulmonary embolism. 2. Patchy bilateral airspace process most prominent over the right lower lobe likely multifocal pneumonia. Small bilateral pleural effusions with associated bibasilar atelectasis. 3. Cardiomegaly with evidence of atherosclerotic coronary artery disease. 4. Aortic atherosclerosis. Aortic Atherosclerosis (ICD10-I70.0). Electronically Signed   By: Marin Olp M.D.   On: 10/29/2021 11:29   DG Chest Port 1 View  Result Date: 10/29/2021 CLINICAL DATA:  Shortness of breath. EXAM: PORTABLE CHEST 1 VIEW COMPARISON:  09/01/2021  FINDINGS: 0854 hours. Low volume film. The cardio pericardial silhouette is enlarged. Diffuse interstitial opacity suggests edema. Bibasilar atelectasis or infiltrate noted without substantial pleural effusion. Left-sided permanent pacemaker evident Telemetry leads overlie the chest. IMPRESSION: 1. Low volume film with diffuse interstitial opacity suggesting edema. 2. Bibasilar atelectasis or infiltrate. Electronically Signed   By: Misty Stanley M.D.   On: 10/29/2021 09:17     Discharge Instructions: Discharge Instructions     Call MD for:  difficulty breathing, headache or visual disturbances   Complete by: As directed    Call MD for:  extreme fatigue   Complete by: As directed    Call MD for:  hives   Complete by: As directed    Call MD for:  persistant dizziness or light-headedness   Complete by: As directed    Call MD for:  persistant nausea and vomiting   Complete by: As directed    Call MD for:  redness, tenderness, or signs of infection (pain, swelling, redness, odor or green/yellow discharge around incision site)   Complete by: As directed    Call MD for:  severe uncontrolled pain   Complete by: As directed    Call MD for:  temperature >100.4   Complete by: As directed    Diet - low sodium heart healthy   Complete by: As directed    Discharge wound care:   Complete by: As directed    Keep wound clean dry and intact   Increase activity slowly   Complete by: As directed        Signed: Delene Ruffini, MD 11/08/2021, 12:48 PM   Pager: [phone]

## 2021-11-08 NOTE — Progress Notes (Signed)
Mobility Specialist Progress Note:   11/08/21 0949  Mobility  Activity Transferred from bed to chair  Level of Assistance Contact guard assist, steadying assist  Assistive Device  (HHA)  Distance Ambulated (ft) 4 ft  Activity Response Tolerated well  $Mobility charge 1 Mobility   Pt received in bed. NT needing help getting pt cleaned up and in the chair. No complaints of pain. Left in chair with call bell in reach and all needs met. Chair alarm on.   [facility] Lanier [place] Mobility Specialist

## 2021-11-08 NOTE — Care Management Important Message (Signed)
Important Message  Patient Details  Name: Virginia Summers MRN: [id number] Date of Birth: 03-11-1941   Medicare Important Message Given:  Yes     Renie Ora 11/08/2021, 12:36 PM

## 2021-11-08 NOTE — Progress Notes (Signed)
Civil engineer, contracting [facility]) Hospital Liaison note  Referral received for patient/family interest in home with hospice. [facility] liaison confirmed interest with daughter Clydie Braun. Hospice  eligibility approved.  Plan is to discharge today by PTAR.   No DME needs at this time. Patient will have a same day RN visit from [facility]   Please send patient home with comfort medications/prescriptions at discharge.  Thank you for this referral.  Yolande Jolly, DNP, [facility] [phone]

## 2021-11-08 NOTE — Progress Notes (Addendum)
Physical Therapy Treatment Patient Details Name: Virginia Summers MRN: [id number] DOB: [birth year] Today's Date: 11/08/2021   History of Present Illness Virginia Summers is a 81 year old female with past medical history of PE 2012 on Xarelto, asthma and possible COPD, HFpEF, hypothyroidism, type 2 diabetes, hypertension, complete heart block status post pacemaker placement 08/2021, CKD 3B, chronic hypoxia on 2-3 L at home, who presented to the emergency room for acute episode of shortness of breath.  Found to have multifocal PNA and CHF exacerbation.    PT Comments    Pt received in bed, family present. Pt ambulated 30' with one seated rest break for O2 recovery. SPO2 dropped to 79% on 4L O2, 3 mins to recover back to low 90's. Vc's for breathing while ambulating and during transitions on and off rollator. Pt more alert and engaged in session today than last week when this therapist worked with her. Following all commands. Pt seated in straight back chair for lunch after session. PT will continue to follow.    Recommendations for follow up therapy are one component of a multi-disciplinary discharge planning process, led by the attending physician.  Recommendations may be updated based on patient status, additional functional criteria and insurance authorization.  Follow Up Recommendations  Home health PT     Assistance Recommended at Discharge Intermittent Supervision/Assistance  Patient can return home with the following A little help with walking and/or transfers;A little help with bathing/dressing/bathroom;Assistance with cooking/housework;Help with stairs or ramp for entrance;Direct supervision/assist for medications management;Assist for transportation   Equipment Recommendations  Rollator (4 wheels)    Recommendations for Other Services       Precautions / Restrictions Precautions Precautions: Fall Precaution Comments: watch SpO2 Restrictions Weight Bearing Restrictions: No      Mobility  Bed Mobility Overal bed mobility: Needs Assistance Bed Mobility: Supine to Sit     Supine to sit: Min guard, HOB elevated     General bed mobility comments: pt able to come to EOB with increased time but without physical assist    Transfers Overall transfer level: Needs assistance Equipment used: Rolling walker (2 wheels) Transfers: Sit to/from Stand Sit to Stand: Min guard, Min assist           General transfer comment: min guard to stand from bed, min A to stand and sit to rollator with cues for safety    Ambulation/Gait Ambulation/Gait assistance: Min guard Gait Distance (Feet): 30 Feet (10', 20') Assistive device: Rolling walker (2 wheels) Gait Pattern/deviations: Step-through pattern Gait velocity: decreased Gait velocity interpretation: <1.31 ft/sec, indicative of household ambulator   General Gait Details: pt with slowed step-through gait, one brief seated rest break with each gait trial. SPO2 dropped to 79% on 4L O2, returned to low 90's in 3 mins   Stairs             Wheelchair Mobility    Modified Rankin (Stroke Patients Only)       Balance Overall balance assessment: Needs assistance Sitting-balance support: No upper extremity supported, Feet supported Sitting balance-Leahy Scale: Good     Standing balance support: Single extremity supported, Bilateral upper extremity supported, Reliant on assistive device for balance Standing balance-Leahy Scale: Poor Standing balance comment: UE support for balance                            Cognition Arousal/Alertness: Awake/alert Behavior During Therapy: WFL for tasks assessed/performed Overall Cognitive Status: Difficult to assess  General Comments: pt is very hard of hearing, answered questions appropriately and interacted with daughters. Mildly delayed processing        Exercises      General Comments General comments  (skin integrity, edema, etc.): returned to 3L O2 after ambulation. Pt to straight back chair to eat lunch after session. 2 daughters present      Pertinent Vitals/Pain Pain Assessment Pain Assessment: No/denies pain    Home Living                          Prior Function            PT Goals (current goals can now be found in the care plan section) Acute Rehab PT Goals Patient Stated Goal: to return to independent, feel better PT Goal Formulation: With patient Time For Goal Achievement: 11/14/21 Potential to Achieve Goals: Fair Progress towards PT goals: Progressing toward goals    Frequency    Min 3X/week      PT Plan Equipment recommendations need to be updated    Co-evaluation              AM-PAC PT "6 Clicks" Mobility   Outcome Measure  Help needed turning from your back to your side while in a flat bed without using bedrails?: A Little Help needed moving from lying on your back to sitting on the side of a flat bed without using bedrails?: A Little Help needed moving to and from a bed to a chair (including a wheelchair)?: A Little Help needed standing up from a chair using your arms (e.g., wheelchair or bedside chair)?: A Little Help needed to walk in hospital room?: A Little Help needed climbing 3-5 steps with a railing? : Total 6 Click Score: 16    End of Session Equipment Utilized During Treatment: Oxygen;Gait belt Activity Tolerance: Patient limited by fatigue Patient left: in chair;with call bell/phone within reach;with family/visitor present Nurse Communication: Mobility status PT Visit Diagnosis: Other abnormalities of gait and mobility (R26.89);Muscle weakness (generalized) (M62.81)     Time: 9622-2979 PT Time Calculation (min) (ACUTE ONLY): 27 min  Charges:  $Gait Training: 8-22 mins $Therapeutic Activity: 8-22 mins                     Lyanne Co, PT  Acute Rehab Services Secure chat preferred Office  [phone]    Lawana Chambers Adante Courington 11/08/2021, 2:46 PM

## 2021-11-08 NOTE — Discharge Instructions (Addendum)
Dear Mrs. Carte,  Thank you for trusting [place] with your care. We treated you in the hospital for for pneumonia. You were treated with antibiotics and a short course of lasix.  Please continue taking your medications as you have been. Please continue using your supplemental oxygen at home. Hospice has been approved for you and will be helping to organize your care once you are home.

## 2021-11-08 NOTE — Telephone Encounter (Signed)
Alondra from [facility] is requesting call back to see if pt will be continue under the care of Dr. Elease Hashimoto for her hospice care. Requesting call back.

## 2021-11-08 NOTE — TOC Transition Note (Addendum)
Transition of Care [facility]) - CM/SW Discharge Note   Patient Details  Name: Virginia Summers MRN: [id number] Date of Birth: 11-02-1940  [facility]) CM/SW Contact:  Leone Haven, RN Phone Number: 11/08/2021, 1:33 PM   Clinical Narrative:    Patient is for dc today, home with hospice, Melissa with [facility] notified, and daughters in the room states they are ready for ptar to transport.  Martie Round, CSW setting up ptar.     Final next level of care: Home w Hospice Care Barriers to Discharge: No Barriers Identified   Patient Goals and CMS Choice Patient states their goals for this hospitalization and ongoing recovery are:: home with hospice CMS Medicare.gov Compare Post Acute Care list provided to:: Patient Represenative (must comment) Choice offered to / list presented to : Adult Children  Discharge Placement                       Discharge Plan and Services In-house Referral: Hospice / Palliative Care Discharge Planning Services: CM Consult Post Acute Care Choice: Hospice          DME Arranged:  (DME wil be provided by Hospice) DME Agency: NA       HH Arranged: RN HH Agency: [facility] [place] ([facility]) Date HH Agency Contacted: 11/05/21 Time HH Agency Contacted: [id number] Representative spoke with at [facility] Agency: Danford Bad  Social Determinants of Health (SDOH) Interventions     Readmission Risk Interventions    11/01/2021   12:33 PM  Readmission Risk Prevention Plan  Transportation Screening Complete  PCP or Specialist Appt within 3-5 Days Complete  HRI or Home Care Consult Complete  Social Work Consult for Recovery Care Planning/Counseling Complete  Palliative Care Screening Not Applicable  Medication Review Oceanographer) Referral to Pharmacy

## 2021-11-09 NOTE — Telephone Encounter (Signed)
Virginia Summers from [facility]  is returning call and is requesting call back.

## 2021-11-10 NOTE — Telephone Encounter (Signed)
Returned call to Hospice today, both [place] were unavailable. The tech took my message stating that Dr Elease Hashimoto will not be managing this patient's hospice care/orders-they will need to seek PCP. She advised she would get the message to them.

## 2021-12-01 ENCOUNTER — Ambulatory Visit (INDEPENDENT_AMBULATORY_CARE_PROVIDER_SITE_OTHER)

## 2021-12-01 DIAGNOSIS — I442 Atrioventricular block, complete: Secondary | ICD-10-CM

## 2021-12-01 LAB — CUP PACEART REMOTE DEVICE CHECK
Battery Remaining Longevity: 95 mo
Battery Remaining Percentage: 95.5 %
Battery Voltage: 3.05 V
Brady Statistic AP VP Percent: 1 %
Brady Statistic AP VS Percent: 6.6 %
Brady Statistic AS VP Percent: 1 %
Brady Statistic AS VS Percent: 93 %
Brady Statistic RA Percent Paced: 6.3 %
Brady Statistic RV Percent Paced: 1 %
Date Time Interrogation Session: 20230920020023
Implantable Lead Implant Date: 20230620
Implantable Lead Implant Date: 20230620
Implantable Lead Location: 753859
Implantable Lead Location: 753860
Implantable Pulse Generator Implant Date: 20230620
Lead Channel Impedance Value: 390 Ohm
Lead Channel Impedance Value: 410 Ohm
Lead Channel Pacing Threshold Amplitude: 0.75 V
Lead Channel Pacing Threshold Amplitude: 0.75 V
Lead Channel Pacing Threshold Pulse Width: 0.5 ms
Lead Channel Pacing Threshold Pulse Width: 0.5 ms
Lead Channel Sensing Intrinsic Amplitude: 5 mV
Lead Channel Sensing Intrinsic Amplitude: 6.8 mV
Lead Channel Setting Pacing Amplitude: 3.5 V
Lead Channel Setting Pacing Amplitude: 3.5 V
Lead Channel Setting Pacing Pulse Width: 0.5 ms
Lead Channel Setting Sensing Sensitivity: 2 mV
Pulse Gen Model: 2272
Pulse Gen Serial Number: 8091994

## 2021-12-12 DEATH — deceased

## 2021-12-13 NOTE — Progress Notes (Signed)
Remote pacemaker transmission.   

## 2021-12-16 ENCOUNTER — Encounter: Payer: Medicare HMO | Admitting: Internal Medicine
